# Patient Record
Sex: Female | Born: 1935 | Race: White | Hispanic: No | Smoking: Never smoker
Health system: Southern US, Community
[De-identification: ages and names within clinical notes are randomized; demographics above are authoritative.]

## PROBLEM LIST (undated history)

## (undated) DIAGNOSIS — R251 Tremor, unspecified: Secondary | ICD-10-CM

## (undated) DIAGNOSIS — K5790 Diverticulosis of intestine, part unspecified, without perforation or abscess without bleeding: Secondary | ICD-10-CM

## (undated) DIAGNOSIS — F329 Major depressive disorder, single episode, unspecified: Secondary | ICD-10-CM

## (undated) DIAGNOSIS — R51 Headache: Secondary | ICD-10-CM

## (undated) DIAGNOSIS — K5900 Constipation, unspecified: Secondary | ICD-10-CM

## (undated) DIAGNOSIS — G8929 Other chronic pain: Secondary | ICD-10-CM

## (undated) DIAGNOSIS — R519 Headache, unspecified: Secondary | ICD-10-CM

## (undated) DIAGNOSIS — I10 Essential (primary) hypertension: Secondary | ICD-10-CM

## (undated) DIAGNOSIS — Z862 Personal history of diseases of the blood and blood-forming organs and certain disorders involving the immune mechanism: Secondary | ICD-10-CM

## (undated) DIAGNOSIS — F419 Anxiety disorder, unspecified: Secondary | ICD-10-CM

## (undated) DIAGNOSIS — R011 Cardiac murmur, unspecified: Secondary | ICD-10-CM

## (undated) DIAGNOSIS — F32A Depression, unspecified: Secondary | ICD-10-CM

## (undated) DIAGNOSIS — N189 Chronic kidney disease, unspecified: Secondary | ICD-10-CM

## (undated) DIAGNOSIS — E785 Hyperlipidemia, unspecified: Secondary | ICD-10-CM

## (undated) DIAGNOSIS — D649 Anemia, unspecified: Secondary | ICD-10-CM

## (undated) DIAGNOSIS — M25562 Pain in left knee: Principal | ICD-10-CM

## (undated) DIAGNOSIS — E538 Deficiency of other specified B group vitamins: Secondary | ICD-10-CM

## (undated) DIAGNOSIS — M1990 Unspecified osteoarthritis, unspecified site: Secondary | ICD-10-CM

## (undated) DIAGNOSIS — I519 Heart disease, unspecified: Secondary | ICD-10-CM

## (undated) DIAGNOSIS — R64 Cachexia: Secondary | ICD-10-CM

## (undated) HISTORY — DX: Headache: R51

## (undated) HISTORY — DX: Personal history of diseases of the blood and blood-forming organs and certain disorders involving the immune mechanism: Z86.2

## (undated) HISTORY — DX: Heart disease, unspecified: I51.9

## (undated) HISTORY — DX: Headache, unspecified: R51.9

## (undated) HISTORY — DX: Depression, unspecified: F32.A

## (undated) HISTORY — DX: Other chronic pain: G89.29

## (undated) HISTORY — DX: Major depressive disorder, single episode, unspecified: F32.9

## (undated) HISTORY — DX: Anxiety disorder, unspecified: F41.9

## (undated) HISTORY — PX: EYE SURGERY: SHX253

## (undated) HISTORY — DX: Constipation, unspecified: K59.00

## (undated) HISTORY — DX: Cachexia: R64

## (undated) HISTORY — DX: Unspecified osteoarthritis, unspecified site: M19.90

## (undated) HISTORY — DX: Essential (primary) hypertension: I10

## (undated) HISTORY — DX: Diverticulosis of intestine, part unspecified, without perforation or abscess without bleeding: K57.90

## (undated) HISTORY — DX: Chronic kidney disease, unspecified: N18.9

## (undated) HISTORY — DX: Hyperlipidemia, unspecified: E78.5

## (undated) HISTORY — DX: Anemia, unspecified: D64.9

## (undated) HISTORY — PX: CHOLECYSTECTOMY: SHX55

## (undated) HISTORY — DX: Deficiency of other specified B group vitamins: E53.8

## (undated) HISTORY — DX: Pain in left knee: M25.562

## (undated) SURGERY — Surgical Case
Anesthesia: *Unknown

---

## 1971-02-26 HISTORY — PX: ABDOMINAL HYSTERECTOMY: SHX81

## 1998-02-25 HISTORY — PX: HERNIA REPAIR: SHX51

## 1999-11-21 ENCOUNTER — Encounter: Admission: RE | Admit: 1999-11-21 | Discharge: 2000-02-19 | Payer: Self-pay | Admitting: Orthopedic Surgery

## 2000-02-26 HISTORY — PX: CATARACT EXTRACTION W/ INTRAOCULAR LENS  IMPLANT, BILATERAL: SHX1307

## 2001-11-18 ENCOUNTER — Inpatient Hospital Stay (HOSPITAL_COMMUNITY): Admission: EM | Admit: 2001-11-18 | Discharge: 2001-11-26 | Payer: Self-pay | Admitting: Emergency Medicine

## 2001-12-17 ENCOUNTER — Ambulatory Visit (HOSPITAL_COMMUNITY): Admission: RE | Admit: 2001-12-17 | Discharge: 2001-12-17 | Payer: Self-pay | Admitting: Family Medicine

## 2001-12-17 ENCOUNTER — Encounter: Payer: Self-pay | Admitting: Family Medicine

## 2002-01-26 ENCOUNTER — Ambulatory Visit (HOSPITAL_COMMUNITY): Admission: RE | Admit: 2002-01-26 | Discharge: 2002-01-26 | Payer: Self-pay | Admitting: Ophthalmology

## 2002-03-12 ENCOUNTER — Ambulatory Visit (HOSPITAL_COMMUNITY): Admission: RE | Admit: 2002-03-12 | Discharge: 2002-03-12 | Payer: Self-pay | Admitting: Family Medicine

## 2002-03-12 ENCOUNTER — Encounter: Payer: Self-pay | Admitting: Family Medicine

## 2002-09-15 ENCOUNTER — Encounter: Payer: Self-pay | Admitting: Family Medicine

## 2002-09-15 ENCOUNTER — Encounter: Admission: RE | Admit: 2002-09-15 | Discharge: 2002-09-15 | Payer: Self-pay | Admitting: Family Medicine

## 2002-12-02 ENCOUNTER — Ambulatory Visit (HOSPITAL_COMMUNITY): Admission: RE | Admit: 2002-12-02 | Discharge: 2002-12-02 | Payer: Self-pay | Admitting: Orthopedic Surgery

## 2002-12-02 ENCOUNTER — Ambulatory Visit (HOSPITAL_BASED_OUTPATIENT_CLINIC_OR_DEPARTMENT_OTHER): Admission: RE | Admit: 2002-12-02 | Discharge: 2002-12-03 | Payer: Self-pay | Admitting: Orthopedic Surgery

## 2002-12-06 ENCOUNTER — Encounter: Payer: Self-pay | Admitting: Emergency Medicine

## 2002-12-06 ENCOUNTER — Inpatient Hospital Stay (HOSPITAL_COMMUNITY): Admission: EM | Admit: 2002-12-06 | Discharge: 2002-12-07 | Payer: Self-pay | Admitting: Emergency Medicine

## 2002-12-16 ENCOUNTER — Encounter: Admission: RE | Admit: 2002-12-16 | Discharge: 2003-03-16 | Payer: Self-pay | Admitting: Orthopedic Surgery

## 2003-09-12 ENCOUNTER — Inpatient Hospital Stay (HOSPITAL_COMMUNITY): Admission: EM | Admit: 2003-09-12 | Discharge: 2003-09-16 | Payer: Self-pay | Admitting: Emergency Medicine

## 2005-02-25 HISTORY — PX: JOINT REPLACEMENT: SHX530

## 2005-03-12 ENCOUNTER — Encounter: Admission: RE | Admit: 2005-03-12 | Discharge: 2005-03-28 | Payer: Self-pay | Admitting: Neurology

## 2005-05-08 ENCOUNTER — Encounter: Admission: RE | Admit: 2005-05-08 | Discharge: 2005-05-08 | Payer: Self-pay | Admitting: Neurology

## 2005-09-17 ENCOUNTER — Inpatient Hospital Stay (HOSPITAL_COMMUNITY): Admission: RE | Admit: 2005-09-17 | Discharge: 2005-09-23 | Payer: Self-pay | Admitting: Orthopedic Surgery

## 2005-09-19 ENCOUNTER — Ambulatory Visit: Payer: Self-pay | Admitting: Hematology & Oncology

## 2005-09-20 ENCOUNTER — Encounter: Payer: Self-pay | Admitting: Vascular Surgery

## 2005-09-23 ENCOUNTER — Ambulatory Visit: Payer: Self-pay | Admitting: Physical Medicine & Rehabilitation

## 2005-10-29 ENCOUNTER — Encounter: Admission: RE | Admit: 2005-10-29 | Discharge: 2005-11-26 | Payer: Self-pay | Admitting: Orthopedic Surgery

## 2006-03-14 ENCOUNTER — Ambulatory Visit: Payer: Self-pay | Admitting: Internal Medicine

## 2006-03-25 ENCOUNTER — Ambulatory Visit: Payer: Self-pay | Admitting: Internal Medicine

## 2006-05-02 ENCOUNTER — Ambulatory Visit: Payer: Self-pay | Admitting: Hematology & Oncology

## 2006-05-23 LAB — CBC & DIFF AND RETIC
Basophils Absolute: 0 10*3/uL (ref 0.0–0.1)
EOS%: 2.3 % (ref 0.0–7.0)
HGB: 9.4 g/dL — ABNORMAL LOW (ref 11.6–15.9)
LYMPH%: 36.7 % (ref 14.0–48.0)
MCH: 32.8 pg (ref 26.0–34.0)
MCV: 94.4 fL (ref 81.0–101.0)
MONO%: 10.3 % (ref 0.0–13.0)
NEUT%: 50 % (ref 39.6–76.8)
Platelets: 163 10*3/uL (ref 145–400)
RDW: 12.3 % (ref 11.3–14.5)
RETIC #: 38.5 10*3/uL (ref 19.7–115.1)

## 2006-05-27 LAB — COMPREHENSIVE METABOLIC PANEL
AST: 16 U/L (ref 0–37)
Albumin: 4.1 g/dL (ref 3.5–5.2)
Alkaline Phosphatase: 60 U/L (ref 39–117)
BUN: 20 mg/dL (ref 6–23)
Creatinine, Ser: 1.3 mg/dL — ABNORMAL HIGH (ref 0.40–1.20)
Glucose, Bld: 93 mg/dL (ref 70–99)
Potassium: 5.4 mEq/L — ABNORMAL HIGH (ref 3.5–5.3)
Total Bilirubin: 0.5 mg/dL (ref 0.3–1.2)

## 2006-05-27 LAB — PROTEIN ELECTROPHORESIS, SERUM
Albumin ELP: 62 % (ref 55.8–66.1)
Alpha-1-Globulin: 3.8 % (ref 2.9–4.9)
Alpha-2-Globulin: 11.2 % (ref 7.1–11.8)
Beta 2: 4.7 % (ref 3.2–6.5)
Beta Globulin: 6.6 % (ref 4.7–7.2)
Total Protein, Serum Electrophoresis: 6.3 g/dL (ref 6.0–8.3)

## 2006-05-27 LAB — ERYTHROPOIETIN: Erythropoietin: 12.8 m[IU]/mL (ref 2.6–34.0)

## 2006-06-13 LAB — CBC WITH DIFFERENTIAL/PLATELET
BASO%: 1 % (ref 0.0–2.0)
Eosinophils Absolute: 0.1 10*3/uL (ref 0.0–0.5)
HCT: 32.8 % — ABNORMAL LOW (ref 34.8–46.6)
HGB: 11.5 g/dL — ABNORMAL LOW (ref 11.6–15.9)
LYMPH%: 36 % (ref 14.0–48.0)
MONO#: 0.5 10*3/uL (ref 0.1–0.9)
NEUT#: 1.9 10*3/uL (ref 1.5–6.5)
NEUT%: 47.5 % (ref 39.6–76.8)
Platelets: 108 10*3/uL — ABNORMAL LOW (ref 145–400)
WBC: 3.9 10*3/uL (ref 3.9–10.0)
lymph#: 1.4 10*3/uL (ref 0.9–3.3)

## 2006-07-02 ENCOUNTER — Ambulatory Visit: Payer: Self-pay | Admitting: Hematology & Oncology

## 2006-07-04 LAB — CBC WITH DIFFERENTIAL/PLATELET
BASO%: 0.6 % (ref 0.0–2.0)
Basophils Absolute: 0 10*3/uL (ref 0.0–0.1)
HCT: 37.5 % (ref 34.8–46.6)
HGB: 12.7 g/dL (ref 11.6–15.9)
LYMPH%: 32.4 % (ref 14.0–48.0)
MCH: 32.3 pg (ref 26.0–34.0)
MCHC: 33.9 g/dL (ref 32.0–36.0)
MONO#: 0.5 10*3/uL (ref 0.1–0.9)
NEUT%: 53.9 % (ref 39.6–76.8)
Platelets: 97 10*3/uL — ABNORMAL LOW (ref 145–400)
lymph#: 1.5 10*3/uL (ref 0.9–3.3)

## 2006-08-01 LAB — CBC WITH DIFFERENTIAL/PLATELET
Basophils Absolute: 0 10*3/uL (ref 0.0–0.1)
Eosinophils Absolute: 0.1 10*3/uL (ref 0.0–0.5)
HCT: 33.4 % — ABNORMAL LOW (ref 34.8–46.6)
LYMPH%: 38.8 % (ref 14.0–48.0)
MCV: 92.3 fL (ref 81.0–101.0)
MONO#: 0.5 10*3/uL (ref 0.1–0.9)
MONO%: 9.9 % (ref 0.0–13.0)
NEUT#: 2.4 10*3/uL (ref 1.5–6.5)
NEUT%: 48.1 % (ref 39.6–76.8)
Platelets: 100 10*3/uL — ABNORMAL LOW (ref 145–400)
WBC: 5.1 10*3/uL (ref 3.9–10.0)

## 2006-09-10 ENCOUNTER — Ambulatory Visit: Payer: Self-pay | Admitting: Hematology & Oncology

## 2006-09-12 LAB — CBC WITH DIFFERENTIAL/PLATELET
BASO%: 0.6 % (ref 0.0–2.0)
LYMPH%: 41 % (ref 14.0–48.0)
MCHC: 34 g/dL (ref 32.0–36.0)
MONO#: 0.5 10*3/uL (ref 0.1–0.9)
RBC: 3.79 10*6/uL (ref 3.70–5.32)
WBC: 4.4 10*3/uL (ref 3.9–10.0)
lymph#: 1.8 10*3/uL (ref 0.9–3.3)

## 2006-09-12 LAB — CHCC SMEAR

## 2006-10-22 ENCOUNTER — Ambulatory Visit: Payer: Self-pay | Admitting: Hematology & Oncology

## 2006-10-24 LAB — CBC WITH DIFFERENTIAL/PLATELET
Eosinophils Absolute: 0.1 10*3/uL (ref 0.0–0.5)
MCHC: 35.2 g/dL (ref 32.0–36.0)
MCV: 91.9 fL (ref 81.0–101.0)
MONO%: 11.3 % (ref 0.0–13.0)
NEUT#: 1.7 10*3/uL (ref 1.5–6.5)
RBC: 2.94 10*6/uL — ABNORMAL LOW (ref 3.70–5.32)
RDW: 15 % — ABNORMAL HIGH (ref 11.3–14.5)
WBC: 4.2 10*3/uL (ref 3.9–10.0)

## 2006-12-09 ENCOUNTER — Ambulatory Visit: Payer: Self-pay | Admitting: Hematology & Oncology

## 2006-12-12 LAB — CBC WITH DIFFERENTIAL/PLATELET
BASO%: 0.5 % (ref 0.0–2.0)
Basophils Absolute: 0 10*3/uL (ref 0.0–0.1)
EOS%: 3 % (ref 0.0–7.0)
HCT: 29 % — ABNORMAL LOW (ref 34.8–46.6)
HGB: 10.2 g/dL — ABNORMAL LOW (ref 11.6–15.9)
MCH: 33.1 pg (ref 26.0–34.0)
MCHC: 35.1 g/dL (ref 32.0–36.0)
MCV: 94.3 fL (ref 81.0–101.0)
MONO%: 10.3 % (ref 0.0–13.0)
NEUT%: 45.4 % (ref 39.6–76.8)

## 2007-01-27 ENCOUNTER — Ambulatory Visit: Payer: Self-pay | Admitting: Hematology & Oncology

## 2007-01-29 LAB — CBC WITH DIFFERENTIAL/PLATELET
BASO%: 0.6 % (ref 0.0–2.0)
EOS%: 2.5 % (ref 0.0–7.0)
LYMPH%: 33 % (ref 14.0–48.0)
MCH: 32.5 pg (ref 26.0–34.0)
MCHC: 34.5 g/dL (ref 32.0–36.0)
MCV: 94.2 fL (ref 81.0–101.0)
MONO%: 10.2 % (ref 0.0–13.0)
Platelets: 106 10*3/uL — ABNORMAL LOW (ref 145–400)
RBC: 3.25 10*6/uL — ABNORMAL LOW (ref 3.70–5.32)

## 2007-01-29 LAB — CHCC SMEAR

## 2007-02-20 LAB — CBC WITH DIFFERENTIAL/PLATELET
Basophils Absolute: 0 10*3/uL (ref 0.0–0.1)
EOS%: 1.9 % (ref 0.0–7.0)
HGB: 12.3 g/dL (ref 11.6–15.9)
MCH: 32.3 pg (ref 26.0–34.0)
MCHC: 33.7 g/dL (ref 32.0–36.0)
MCV: 95.9 fL (ref 81.0–101.0)
MONO%: 14.2 % — ABNORMAL HIGH (ref 0.0–13.0)
RBC: 3.8 10*6/uL (ref 3.70–5.32)
RDW: 15 % — ABNORMAL HIGH (ref 11.3–14.5)

## 2007-03-11 ENCOUNTER — Ambulatory Visit: Payer: Self-pay | Admitting: Hematology & Oncology

## 2007-03-13 LAB — CBC & DIFF AND RETIC
BASO%: 0.4 % (ref 0.0–2.0)
LYMPH%: 27.5 % (ref 14.0–48.0)
MCHC: 33.6 g/dL (ref 32.0–36.0)
MONO#: 0.7 10*3/uL (ref 0.1–0.9)
MONO%: 10.6 % (ref 0.0–13.0)
Platelets: 104 10*3/uL — ABNORMAL LOW (ref 145–400)
RBC: 3.57 10*6/uL — ABNORMAL LOW (ref 3.70–5.32)
Retic %: 0.7 % (ref 0.4–2.3)
WBC: 6.2 10*3/uL (ref 3.9–10.0)

## 2007-03-16 LAB — FERRITIN: Ferritin: 1230 ng/mL — ABNORMAL HIGH (ref 10–291)

## 2007-04-03 LAB — CBC WITH DIFFERENTIAL/PLATELET
Basophils Absolute: 0 10*3/uL (ref 0.0–0.1)
Eosinophils Absolute: 0.1 10*3/uL (ref 0.0–0.5)
HCT: 35.7 % (ref 34.8–46.6)
HGB: 11.5 g/dL — ABNORMAL LOW (ref 11.6–15.9)
LYMPH%: 27.1 % (ref 14.0–48.0)
MCV: 94.8 fL (ref 81.0–101.0)
MONO%: 10.3 % (ref 0.0–13.0)
NEUT#: 3.2 10*3/uL (ref 1.5–6.5)
Platelets: 140 10*3/uL — ABNORMAL LOW (ref 145–400)

## 2007-04-22 ENCOUNTER — Ambulatory Visit: Payer: Self-pay | Admitting: Hematology & Oncology

## 2007-04-24 LAB — CBC WITH DIFFERENTIAL/PLATELET
Eosinophils Absolute: 0.1 10*3/uL (ref 0.0–0.5)
LYMPH%: 35.7 % (ref 14.0–48.0)
MCHC: 33.6 g/dL (ref 32.0–36.0)
MCV: 95.3 fL (ref 81.0–101.0)
MONO%: 10.5 % (ref 0.0–13.0)
NEUT#: 2.2 10*3/uL (ref 1.5–6.5)
NEUT%: 50.7 % (ref 39.6–76.8)
Platelets: 117 10*3/uL — ABNORMAL LOW (ref 145–400)
RBC: 3.84 10*6/uL (ref 3.70–5.32)

## 2007-04-24 LAB — CHCC SMEAR

## 2007-05-15 LAB — CBC WITH DIFFERENTIAL/PLATELET
BASO%: 0.6 % (ref 0.0–2.0)
Basophils Absolute: 0 10*3/uL (ref 0.0–0.1)
EOS%: 2.1 % (ref 0.0–7.0)
HCT: 33.7 % — ABNORMAL LOW (ref 34.8–46.6)
HGB: 11.6 g/dL (ref 11.6–15.9)
LYMPH%: 33.7 % (ref 14.0–48.0)
MCH: 32.3 pg (ref 26.0–34.0)
MCHC: 34.5 g/dL (ref 32.0–36.0)
MCV: 93.5 fL (ref 81.0–101.0)
NEUT%: 53 % (ref 39.6–76.8)
Platelets: 101 10*3/uL — ABNORMAL LOW (ref 145–400)
lymph#: 1.8 10*3/uL (ref 0.9–3.3)

## 2007-06-03 ENCOUNTER — Ambulatory Visit: Payer: Self-pay | Admitting: Hematology & Oncology

## 2007-06-05 LAB — CBC WITH DIFFERENTIAL/PLATELET
BASO%: 0.1 % (ref 0.0–2.0)
Basophils Absolute: 0 10*3/uL (ref 0.0–0.1)
EOS%: 1.5 % (ref 0.0–7.0)
Eosinophils Absolute: 0.1 10*3/uL (ref 0.0–0.5)
HCT: 29.1 % — ABNORMAL LOW (ref 34.8–46.6)
HGB: 10.1 g/dL — ABNORMAL LOW (ref 11.6–15.9)
LYMPH%: 30.2 % (ref 14.0–48.0)
MCH: 31.9 pg (ref 26.0–34.0)
MCHC: 34.6 g/dL (ref 32.0–36.0)
MCV: 92.2 fL (ref 81.0–101.0)
MONO#: 0.6 10*3/uL (ref 0.1–0.9)
MONO%: 9.7 % (ref 0.0–13.0)
NEUT#: 3.4 10*3/uL (ref 1.5–6.5)
NEUT%: 58.5 % (ref 39.6–76.8)
Platelets: 96 10*3/uL — ABNORMAL LOW (ref 145–400)
RBC: 3.16 10*6/uL — ABNORMAL LOW (ref 3.70–5.32)
RDW: 13.7 % (ref 11.3–14.5)
WBC: 5.9 10*3/uL (ref 3.9–10.0)
lymph#: 1.8 10*3/uL (ref 0.9–3.3)

## 2007-06-08 ENCOUNTER — Encounter: Admission: RE | Admit: 2007-06-08 | Discharge: 2007-06-08 | Payer: Self-pay | Admitting: Family Medicine

## 2007-06-18 ENCOUNTER — Encounter: Admission: RE | Admit: 2007-06-18 | Discharge: 2007-06-18 | Payer: Self-pay | Admitting: Family Medicine

## 2007-06-26 LAB — CBC WITH DIFFERENTIAL/PLATELET
Basophils Absolute: 0.1 10*3/uL (ref 0.0–0.1)
Eosinophils Absolute: 0.1 10*3/uL (ref 0.0–0.5)
HGB: 11.2 g/dL — ABNORMAL LOW (ref 11.6–15.9)
MCV: 93.6 fL (ref 81.0–101.0)
MONO#: 0.5 10*3/uL (ref 0.1–0.9)
MONO%: 11.3 % (ref 0.0–13.0)
NEUT#: 2.3 10*3/uL (ref 1.5–6.5)
Platelets: 114 10*3/uL — ABNORMAL LOW (ref 145–400)
RBC: 3.5 10*6/uL — ABNORMAL LOW (ref 3.70–5.32)
RDW: 14.8 % — ABNORMAL HIGH (ref 11.3–14.5)
WBC: 4.7 10*3/uL (ref 3.9–10.0)

## 2007-06-26 LAB — CHCC SMEAR

## 2007-07-15 ENCOUNTER — Ambulatory Visit: Payer: Self-pay | Admitting: Hematology & Oncology

## 2007-08-07 LAB — CBC & DIFF AND RETIC
BASO%: 0.6 % (ref 0.0–2.0)
EOS%: 2.3 % (ref 0.0–7.0)
HCT: 38.2 % (ref 34.8–46.6)
LYMPH%: 33.5 % (ref 14.0–48.0)
MCH: 31.6 pg (ref 26.0–34.0)
MCHC: 33.7 g/dL (ref 32.0–36.0)
MONO#: 0.5 10*3/uL (ref 0.1–0.9)
MONO%: 11.2 % (ref 0.0–13.0)
NEUT%: 52.4 % (ref 39.6–76.8)
Platelets: 117 10*3/uL — ABNORMAL LOW (ref 145–400)
RBC: 4.08 10*6/uL (ref 3.70–5.32)
Retic %: 0.7 % (ref 0.4–2.3)
WBC: 4.9 10*3/uL (ref 3.9–10.0)

## 2007-08-27 LAB — CBC WITH DIFFERENTIAL/PLATELET
Basophils Absolute: 0 10*3/uL (ref 0.0–0.1)
Eosinophils Absolute: 0.1 10*3/uL (ref 0.0–0.5)
HCT: 33.8 % — ABNORMAL LOW (ref 34.8–46.6)
HGB: 11.4 g/dL — ABNORMAL LOW (ref 11.6–15.9)
LYMPH%: 48.6 % — ABNORMAL HIGH (ref 14.0–48.0)
MCV: 92 fL (ref 81.0–101.0)
MONO#: 0.4 10*3/uL (ref 0.1–0.9)
MONO%: 9.4 % (ref 0.0–13.0)
NEUT#: 1.8 10*3/uL (ref 1.5–6.5)
Platelets: 92 10*3/uL — ABNORMAL LOW (ref 145–400)
WBC: 4.7 10*3/uL (ref 3.9–10.0)

## 2007-09-15 ENCOUNTER — Ambulatory Visit: Payer: Self-pay | Admitting: Hematology

## 2007-09-18 LAB — CBC WITH DIFFERENTIAL/PLATELET
Eosinophils Absolute: 0.1 10*3/uL (ref 0.0–0.5)
HCT: 35.1 % (ref 34.8–46.6)
LYMPH%: 32.5 % (ref 14.0–48.0)
MCHC: 33.7 g/dL (ref 32.0–36.0)
MCV: 92.7 fL (ref 81.0–101.0)
MONO#: 0.4 10*3/uL (ref 0.1–0.9)
MONO%: 10.3 % (ref 0.0–13.0)
NEUT#: 2.3 10*3/uL (ref 1.5–6.5)
NEUT%: 54.3 % (ref 39.6–76.8)
Platelets: 101 10*3/uL — ABNORMAL LOW (ref 145–400)
RBC: 3.79 10*6/uL (ref 3.70–5.32)

## 2007-10-09 LAB — CBC & DIFF AND RETIC
BASO%: 0.5 % (ref 0.0–2.0)
Basophils Absolute: 0 10*3/uL (ref 0.0–0.1)
EOS%: 2.5 % (ref 0.0–7.0)
Eosinophils Absolute: 0.1 10*3/uL (ref 0.0–0.5)
HCT: 32.7 % — ABNORMAL LOW (ref 34.8–46.6)
HGB: 11 g/dL — ABNORMAL LOW (ref 11.6–15.9)
IRF: 0.26 (ref 0.130–0.330)
LYMPH%: 40.5 % (ref 14.0–48.0)
MCH: 31.4 pg (ref 26.0–34.0)
MCHC: 33.8 g/dL (ref 32.0–36.0)
MCV: 93 fL (ref 81.0–101.0)
MONO#: 0.4 10*3/uL (ref 0.1–0.9)
MONO%: 10 % (ref 0.0–13.0)
NEUT#: 2 10*3/uL (ref 1.5–6.5)
NEUT%: 46.5 % (ref 39.6–76.8)
Platelets: 120 10*3/uL — ABNORMAL LOW (ref 145–400)
RBC: 3.51 10*6/uL — ABNORMAL LOW (ref 3.70–5.32)
RDW: 14.3 % (ref 11.3–14.5)
RETIC #: 18.3 10*3/uL — ABNORMAL LOW (ref 19.7–115.1)
Retic %: 0.5 % (ref 0.4–2.3)
WBC: 4.4 10*3/uL (ref 3.9–10.0)
lymph#: 1.8 10*3/uL (ref 0.9–3.3)

## 2007-10-13 LAB — FERRITIN: Ferritin: 858 ng/mL — ABNORMAL HIGH (ref 10–291)

## 2007-10-13 LAB — SPEP & IFE WITH QIG
Beta 2: 4.9 % (ref 3.2–6.5)
Beta Globulin: 6.6 % (ref 4.7–7.2)
Gamma Globulin: 10.7 % — ABNORMAL LOW (ref 11.1–18.8)
IgA: 216 mg/dL (ref 68–378)
IgG (Immunoglobin G), Serum: 660 mg/dL — ABNORMAL LOW (ref 694–1618)
Total Protein, Serum Electrophoresis: 5.9 g/dL — ABNORMAL LOW (ref 6.0–8.3)

## 2007-10-13 LAB — METHYLMALONIC ACID, SERUM: Methylmalonic Acid, Quantitative: 1210 nmol/L — ABNORMAL HIGH (ref 87–318)

## 2007-10-13 LAB — COMPREHENSIVE METABOLIC PANEL
ALT: 11 U/L (ref 0–35)
BUN: 31 mg/dL — ABNORMAL HIGH (ref 6–23)
CO2: 25 mEq/L (ref 19–32)
Creatinine, Ser: 1.61 mg/dL — ABNORMAL HIGH (ref 0.40–1.20)
Total Bilirubin: 0.4 mg/dL (ref 0.3–1.2)

## 2007-10-13 LAB — VITAMIN B12: Vitamin B-12: 303 pg/mL (ref 211–911)

## 2007-10-13 LAB — FOLATE: Folate: 19 ng/mL

## 2007-11-03 ENCOUNTER — Ambulatory Visit: Payer: Self-pay | Admitting: Hematology

## 2007-11-06 LAB — CBC WITH DIFFERENTIAL/PLATELET
BASO%: 0.6 % (ref 0.0–2.0)
EOS%: 2.2 % (ref 0.0–7.0)
HCT: 33.1 % — ABNORMAL LOW (ref 34.8–46.6)
LYMPH%: 43.8 % (ref 14.0–48.0)
MCH: 31.1 pg (ref 26.0–34.0)
MCHC: 33.6 g/dL (ref 32.0–36.0)
MONO#: 0.4 10*3/uL (ref 0.1–0.9)
NEUT%: 43.9 % (ref 39.6–76.8)
Platelets: 153 10*3/uL (ref 145–400)

## 2007-12-04 LAB — CBC WITH DIFFERENTIAL/PLATELET
Basophils Absolute: 0 10*3/uL (ref 0.0–0.1)
Eosinophils Absolute: 0.1 10*3/uL (ref 0.0–0.5)
HCT: 32.8 % — ABNORMAL LOW (ref 34.8–46.6)
LYMPH%: 39.4 % (ref 14.0–48.0)
MONO#: 0.4 10*3/uL (ref 0.1–0.9)
NEUT#: 1.8 10*3/uL (ref 1.5–6.5)
NEUT%: 46 % (ref 39.6–76.8)
Platelets: 109 10*3/uL — ABNORMAL LOW (ref 145–400)
WBC: 3.9 10*3/uL (ref 3.9–10.0)

## 2007-12-30 ENCOUNTER — Ambulatory Visit: Payer: Self-pay | Admitting: Hematology

## 2008-01-01 LAB — CBC WITH DIFFERENTIAL/PLATELET
Eosinophils Absolute: 0.1 10*3/uL (ref 0.0–0.5)
LYMPH%: 36.3 % (ref 14.0–48.0)
MONO#: 0.5 10*3/uL (ref 0.1–0.9)
NEUT#: 2.7 10*3/uL (ref 1.5–6.5)
Platelets: 99 10*3/uL — ABNORMAL LOW (ref 145–400)
RBC: 3.2 10*6/uL — ABNORMAL LOW (ref 3.70–5.32)
RDW: 14.4 % (ref 11.3–14.5)
WBC: 5.3 10*3/uL (ref 3.9–10.0)

## 2008-01-29 LAB — CBC WITH DIFFERENTIAL/PLATELET
BASO%: 1 % (ref 0.0–2.0)
EOS%: 1.8 % (ref 0.0–7.0)
LYMPH%: 42 % (ref 14.0–48.0)
MCH: 31.9 pg (ref 26.0–34.0)
MCHC: 34 g/dL (ref 32.0–36.0)
MCV: 94 fL (ref 81.0–101.0)
MONO%: 11.1 % (ref 0.0–13.0)
Platelets: 99 10*3/uL — ABNORMAL LOW (ref 145–400)
RBC: 3.3 10*6/uL — ABNORMAL LOW (ref 3.70–5.32)
WBC: 4.3 10*3/uL (ref 3.9–10.0)

## 2008-02-12 LAB — CBC & DIFF AND RETIC
BASO%: 0.7 % (ref 0.0–2.0)
EOS%: 1.7 % (ref 0.0–7.0)
HCT: 35.4 % (ref 34.8–46.6)
IRF: 0.38 — ABNORMAL HIGH (ref 0.130–0.330)
MCH: 32.1 pg (ref 26.0–34.0)
MCHC: 33.7 g/dL (ref 32.0–36.0)
NEUT%: 63.5 % (ref 39.6–76.8)
RBC: 3.72 10*6/uL (ref 3.70–5.32)
RDW: 15.8 % — ABNORMAL HIGH (ref 11.3–14.5)
Retic %: 2 % (ref 0.4–2.3)
lymph#: 1.3 10*3/uL (ref 0.9–3.3)

## 2008-02-12 LAB — MORPHOLOGY: PLT EST: DECREASED

## 2008-02-12 LAB — CHCC SMEAR

## 2008-02-12 LAB — COMPREHENSIVE METABOLIC PANEL
Albumin: 4.3 g/dL (ref 3.5–5.2)
Alkaline Phosphatase: 31 U/L — ABNORMAL LOW (ref 39–117)
BUN: 22 mg/dL (ref 6–23)
Glucose, Bld: 87 mg/dL (ref 70–99)
Potassium: 4.7 mEq/L (ref 3.5–5.3)

## 2008-02-15 ENCOUNTER — Ambulatory Visit: Payer: Self-pay | Admitting: Hematology

## 2008-03-11 LAB — CBC WITH DIFFERENTIAL/PLATELET
Basophils Absolute: 0 10*3/uL (ref 0.0–0.1)
Eosinophils Absolute: 0.1 10*3/uL (ref 0.0–0.5)
LYMPH%: 33 % (ref 14.0–48.0)
MCV: 93.2 fL (ref 81.0–101.0)
MONO%: 9 % (ref 0.0–13.0)
NEUT#: 2.4 10*3/uL (ref 1.5–6.5)
NEUT%: 55.8 % (ref 39.6–76.8)
Platelets: 94 10*3/uL — ABNORMAL LOW (ref 145–400)
RBC: 3.43 10*6/uL — ABNORMAL LOW (ref 3.70–5.32)

## 2008-04-13 ENCOUNTER — Ambulatory Visit: Payer: Self-pay | Admitting: Oncology

## 2008-04-15 LAB — CBC WITH DIFFERENTIAL/PLATELET
Eosinophils Absolute: 0.1 10*3/uL (ref 0.0–0.5)
MONO#: 0.5 10*3/uL (ref 0.1–0.9)
MONO%: 11.1 % (ref 0.0–14.0)
NEUT#: 2.4 10*3/uL (ref 1.5–6.5)
RBC: 3.48 10*6/uL — ABNORMAL LOW (ref 3.70–5.45)
RDW: 14.8 % — ABNORMAL HIGH (ref 11.2–14.5)
WBC: 4.7 10*3/uL (ref 3.9–10.3)
lymph#: 1.7 10*3/uL (ref 0.9–3.3)

## 2008-05-13 LAB — CBC WITH DIFFERENTIAL/PLATELET
Basophils Absolute: 0 10*3/uL (ref 0.0–0.1)
EOS%: 1.6 % (ref 0.0–7.0)
HCT: 28.9 % — ABNORMAL LOW (ref 34.8–46.6)
HGB: 9.8 g/dL — ABNORMAL LOW (ref 11.6–15.9)
MCH: 31.4 pg (ref 25.1–34.0)
MCV: 92.8 fL (ref 79.5–101.0)
MONO%: 9 % (ref 0.0–14.0)
NEUT%: 66.7 % (ref 38.4–76.8)

## 2008-05-13 LAB — BASIC METABOLIC PANEL
BUN: 24 mg/dL — ABNORMAL HIGH (ref 6–23)
Calcium: 9.3 mg/dL (ref 8.4–10.5)
Creatinine, Ser: 1.3 mg/dL — ABNORMAL HIGH (ref 0.40–1.20)

## 2008-05-20 ENCOUNTER — Encounter (INDEPENDENT_AMBULATORY_CARE_PROVIDER_SITE_OTHER): Payer: Self-pay | Admitting: *Deleted

## 2008-05-20 ENCOUNTER — Emergency Department (HOSPITAL_COMMUNITY): Admission: EM | Admit: 2008-05-20 | Discharge: 2008-05-20 | Payer: Self-pay | Admitting: Emergency Medicine

## 2008-05-23 ENCOUNTER — Telehealth: Payer: Self-pay | Admitting: Internal Medicine

## 2008-05-25 ENCOUNTER — Encounter: Payer: Self-pay | Admitting: Nurse Practitioner

## 2008-05-25 DIAGNOSIS — R110 Nausea: Secondary | ICD-10-CM

## 2008-05-25 DIAGNOSIS — K5730 Diverticulosis of large intestine without perforation or abscess without bleeding: Secondary | ICD-10-CM | POA: Insufficient documentation

## 2008-06-08 ENCOUNTER — Ambulatory Visit: Payer: Self-pay | Admitting: Oncology

## 2008-06-10 LAB — CBC WITH DIFFERENTIAL/PLATELET
Basophils Absolute: 0 10*3/uL (ref 0.0–0.1)
EOS%: 6.6 % (ref 0.0–7.0)
HCT: 30.1 % — ABNORMAL LOW (ref 34.8–46.6)
HGB: 10.1 g/dL — ABNORMAL LOW (ref 11.6–15.9)
LYMPH%: 35.2 % (ref 14.0–49.7)
MCH: 31.1 pg (ref 25.1–34.0)
MONO#: 0.4 10*3/uL (ref 0.1–0.9)
NEUT%: 47.4 % (ref 38.4–76.8)
Platelets: 139 10*3/uL — ABNORMAL LOW (ref 145–400)
lymph#: 1.5 10*3/uL (ref 0.9–3.3)

## 2008-07-02 ENCOUNTER — Ambulatory Visit: Payer: Self-pay | Admitting: Pulmonary Disease

## 2008-07-02 ENCOUNTER — Inpatient Hospital Stay (HOSPITAL_COMMUNITY): Admission: EM | Admit: 2008-07-02 | Discharge: 2008-07-24 | Payer: Self-pay | Admitting: Emergency Medicine

## 2008-07-04 ENCOUNTER — Ambulatory Visit: Payer: Self-pay | Admitting: Internal Medicine

## 2008-07-05 ENCOUNTER — Encounter: Payer: Self-pay | Admitting: Internal Medicine

## 2008-07-20 ENCOUNTER — Encounter: Payer: Self-pay | Admitting: Internal Medicine

## 2008-08-10 ENCOUNTER — Ambulatory Visit: Payer: Self-pay | Admitting: Oncology

## 2008-08-12 LAB — CBC WITH DIFFERENTIAL/PLATELET
BASO%: 0.6 % (ref 0.0–2.0)
EOS%: 2.6 % (ref 0.0–7.0)
LYMPH%: 35.4 % (ref 14.0–49.7)
MCHC: 34.7 g/dL (ref 31.5–36.0)
MONO#: 0.4 10*3/uL (ref 0.1–0.9)
Platelets: 168 10*3/uL (ref 145–400)
RBC: 3.76 10*6/uL (ref 3.70–5.45)
WBC: 5.2 10*3/uL (ref 3.9–10.3)
lymph#: 1.9 10*3/uL (ref 0.9–3.3)

## 2008-08-19 LAB — IRON AND TIBC
Iron: 134 ug/dL (ref 42–145)
TIBC: 228 ug/dL — ABNORMAL LOW (ref 250–470)
UIBC: 94 ug/dL

## 2008-08-19 LAB — COMPREHENSIVE METABOLIC PANEL WITH GFR
ALT: 13 U/L (ref 0–35)
AST: 18 U/L (ref 0–37)
Albumin: 3.3 g/dL — ABNORMAL LOW (ref 3.5–5.2)
Alkaline Phosphatase: 60 U/L (ref 39–117)
BUN: 40 mg/dL — ABNORMAL HIGH (ref 6–23)
CO2: 27 meq/L (ref 19–32)
Calcium: 9.1 mg/dL (ref 8.4–10.5)
Chloride: 103 meq/L (ref 96–112)
Creatinine, Ser: 2.15 mg/dL — ABNORMAL HIGH (ref 0.40–1.20)
Glucose, Bld: 113 mg/dL — ABNORMAL HIGH (ref 70–99)
Potassium: 5.4 meq/L — ABNORMAL HIGH (ref 3.5–5.3)
Sodium: 137 meq/L (ref 135–145)
Total Bilirubin: 0.5 mg/dL (ref 0.3–1.2)
Total Protein: 6.1 g/dL (ref 6.0–8.3)

## 2008-08-19 LAB — PROTEIN ELECTROPHORESIS, SERUM
Alpha-2-Globulin: 13.7 % — ABNORMAL HIGH (ref 7.1–11.8)
Beta 2: 6.2 % (ref 3.2–6.5)
Beta Globulin: 6.1 % (ref 4.7–7.2)
Total Protein, Serum Electrophoresis: 6.1 g/dL (ref 6.0–8.3)

## 2008-08-19 LAB — FERRITIN: Ferritin: 1380 ng/mL — ABNORMAL HIGH (ref 10–291)

## 2008-08-19 LAB — FOLATE: Folate: >20 ng/mL

## 2008-08-19 LAB — METHYLMALONIC ACID, SERUM: Methylmalonic Acid, Quantitative: 1430

## 2008-09-07 ENCOUNTER — Ambulatory Visit: Payer: Self-pay | Admitting: Oncology

## 2008-09-09 LAB — CBC WITH DIFFERENTIAL/PLATELET
BASO%: 0.9 % (ref 0.0–2.0)
LYMPH%: 54.7 % — ABNORMAL HIGH (ref 14.0–49.7)
MCHC: 34.5 g/dL (ref 31.5–36.0)
MCV: 90.1 fL (ref 79.5–101.0)
MONO%: 10 % (ref 0.0–14.0)
Platelets: 94 10*3/uL — ABNORMAL LOW (ref 145–400)
RBC: 3.15 10*6/uL — ABNORMAL LOW (ref 3.70–5.45)

## 2008-10-05 ENCOUNTER — Ambulatory Visit: Payer: Self-pay | Admitting: Oncology

## 2008-10-07 LAB — CBC WITH DIFFERENTIAL/PLATELET
Basophils Absolute: 0 10*3/uL (ref 0.0–0.1)
EOS%: 1 % (ref 0.0–7.0)
HCT: 32 % — ABNORMAL LOW (ref 34.8–46.6)
HGB: 10.9 g/dL — ABNORMAL LOW (ref 11.6–15.9)
MCH: 31.9 pg (ref 25.1–34.0)
MONO#: 0.3 10*3/uL (ref 0.1–0.9)
NEUT%: 38 % — ABNORMAL LOW (ref 38.4–76.8)
Platelets: 77 10*3/uL — ABNORMAL LOW (ref 145–400)
lymph#: 1.5 10*3/uL (ref 0.9–3.3)

## 2008-10-11 ENCOUNTER — Other Ambulatory Visit: Admission: RE | Admit: 2008-10-11 | Discharge: 2008-10-11 | Payer: Self-pay | Admitting: Oncology

## 2008-10-11 ENCOUNTER — Encounter: Payer: Self-pay | Admitting: Oncology

## 2008-10-11 LAB — CBC WITH DIFFERENTIAL/PLATELET
Basophils Absolute: 0 10*3/uL (ref 0.0–0.1)
EOS%: 0.9 % (ref 0.0–7.0)
HCT: 31.1 % — ABNORMAL LOW (ref 34.8–46.6)
HGB: 10.6 g/dL — ABNORMAL LOW (ref 11.6–15.9)
MCH: 31.9 pg (ref 25.1–34.0)
MCV: 94.1 fL (ref 79.5–101.0)
MONO%: 10.9 % (ref 0.0–14.0)
NEUT%: 48.2 % (ref 38.4–76.8)

## 2008-11-02 ENCOUNTER — Ambulatory Visit: Payer: Self-pay | Admitting: Oncology

## 2008-11-04 LAB — CBC WITH DIFFERENTIAL/PLATELET
BASO%: 1.1 % (ref 0.0–2.0)
LYMPH%: 47.2 % (ref 14.0–49.7)
MCHC: 33.9 g/dL (ref 31.5–36.0)
MCV: 94.9 fL (ref 79.5–101.0)
MONO%: 11.5 % (ref 0.0–14.0)
Platelets: 77 10*3/uL — ABNORMAL LOW (ref 145–400)
RBC: 3.51 10*6/uL — ABNORMAL LOW (ref 3.70–5.45)

## 2008-11-04 LAB — COMPREHENSIVE METABOLIC PANEL
ALT: 9 U/L (ref 0–35)
AST: 14 U/L (ref 0–37)
Creatinine, Ser: 1.29 mg/dL — ABNORMAL HIGH (ref 0.40–1.20)
Total Bilirubin: 0.5 mg/dL (ref 0.3–1.2)

## 2008-11-04 LAB — CHCC SMEAR

## 2008-11-10 LAB — CBC WITH DIFFERENTIAL/PLATELET
BASO%: 0 % (ref 0.0–2.0)
Basophils Absolute: 0 10*3/uL (ref 0.0–0.1)
HCT: 34.7 % — ABNORMAL LOW (ref 34.8–46.6)
LYMPH%: 41 % (ref 14.0–49.7)
MCHC: 32.9 g/dL (ref 31.5–36.0)
MONO#: 1 10*3/uL — ABNORMAL HIGH (ref 0.1–0.9)
NEUT%: 44.8 % (ref 38.4–76.8)
Platelets: 93 10*3/uL — ABNORMAL LOW (ref 145–400)
WBC: 7.1 10*3/uL (ref 3.9–10.3)

## 2008-12-02 ENCOUNTER — Ambulatory Visit: Payer: Self-pay | Admitting: Oncology

## 2008-12-02 LAB — CBC WITH DIFFERENTIAL/PLATELET
Basophils Absolute: 0 10*3/uL (ref 0.0–0.1)
Eosinophils Absolute: 0 10*3/uL (ref 0.0–0.5)
HGB: 10.7 g/dL — ABNORMAL LOW (ref 11.6–15.9)
LYMPH%: 29.7 % (ref 14.0–49.7)
MCV: 94.7 fL (ref 79.5–101.0)
MONO%: 7.4 % (ref 0.0–14.0)
NEUT#: 3.2 10*3/uL (ref 1.5–6.5)
Platelets: 132 10*3/uL — ABNORMAL LOW (ref 145–400)
RBC: 3.32 10*6/uL — ABNORMAL LOW (ref 3.70–5.45)

## 2008-12-29 LAB — COMPREHENSIVE METABOLIC PANEL
ALT: 11 U/L (ref 0–35)
AST: 11 U/L (ref 0–37)
Albumin: 3.6 g/dL (ref 3.5–5.2)
Alkaline Phosphatase: 34 U/L — ABNORMAL LOW (ref 39–117)
BUN: 31 mg/dL — ABNORMAL HIGH (ref 6–23)
CO2: 28 mEq/L (ref 19–32)
Calcium: 8.1 mg/dL — ABNORMAL LOW (ref 8.4–10.5)
Chloride: 99 mEq/L (ref 96–112)
Creatinine, Ser: 1.57 mg/dL — ABNORMAL HIGH (ref 0.40–1.20)
Glucose, Bld: 118 mg/dL — ABNORMAL HIGH (ref 70–99)
Potassium: 4.2 mEq/L (ref 3.5–5.3)
Sodium: 135 mEq/L (ref 135–145)
Total Bilirubin: 0.5 mg/dL (ref 0.3–1.2)
Total Protein: 5.3 g/dL — ABNORMAL LOW (ref 6.0–8.3)

## 2008-12-29 LAB — CBC WITH DIFFERENTIAL/PLATELET
BASO%: 0.2 % (ref 0.0–2.0)
EOS%: 0.4 % (ref 0.0–7.0)
HCT: 35.5 % (ref 34.8–46.6)
MCH: 32.4 pg (ref 25.1–34.0)
MCHC: 33.3 g/dL (ref 31.5–36.0)
MONO%: 5.5 % (ref 0.0–14.0)
NEUT%: 80 % — ABNORMAL HIGH (ref 38.4–76.8)
lymph#: 1 10*3/uL (ref 0.9–3.3)

## 2009-01-13 ENCOUNTER — Ambulatory Visit: Payer: Self-pay | Admitting: Oncology

## 2009-01-18 LAB — CBC WITH DIFFERENTIAL/PLATELET
BASO%: 0.8 % (ref 0.0–2.0)
Basophils Absolute: 0.1 10*3/uL (ref 0.0–0.1)
EOS%: 1.6 % (ref 0.0–7.0)
Eosinophils Absolute: 0.1 10*3/uL (ref 0.0–0.5)
HCT: 32.3 % — ABNORMAL LOW (ref 34.8–46.6)
HGB: 10.6 g/dL — ABNORMAL LOW (ref 11.6–15.9)
LYMPH%: 18.7 % (ref 14.0–49.7)
MCH: 31.7 pg (ref 25.1–34.0)
MCHC: 32.9 g/dL (ref 31.5–36.0)
MCV: 96.5 fL (ref 79.5–101.0)
MONO#: 0.2 10*3/uL (ref 0.1–0.9)
MONO%: 2.1 % (ref 0.0–14.0)
NEUT#: 6 10*3/uL (ref 1.5–6.5)
NEUT%: 76.8 % (ref 38.4–76.8)
Platelets: 102 10*3/uL — ABNORMAL LOW (ref 145–400)
RBC: 3.35 10*6/uL — ABNORMAL LOW (ref 3.70–5.45)
RDW: 15.3 % — ABNORMAL HIGH (ref 11.2–14.5)
WBC: 7.8 10*3/uL (ref 3.9–10.3)
lymph#: 1.4 10*3/uL (ref 0.9–3.3)

## 2009-01-27 LAB — CBC WITH DIFFERENTIAL/PLATELET
Basophils Absolute: 0 10*3/uL (ref 0.0–0.1)
Eosinophils Absolute: 0 10*3/uL (ref 0.0–0.5)
HGB: 10.4 g/dL — ABNORMAL LOW (ref 11.6–15.9)
MCV: 97.9 fL (ref 79.5–101.0)
MONO#: 0.6 10*3/uL (ref 0.1–0.9)
MONO%: 6.3 % (ref 0.0–14.0)
NEUT#: 7.4 10*3/uL — ABNORMAL HIGH (ref 1.5–6.5)
RBC: 3.35 10*6/uL — ABNORMAL LOW (ref 3.70–5.45)
RDW: 14.7 % — ABNORMAL HIGH (ref 11.2–14.5)
WBC: 9.5 10*3/uL (ref 3.9–10.3)
lymph#: 1.5 10*3/uL (ref 0.9–3.3)

## 2009-02-01 ENCOUNTER — Inpatient Hospital Stay (HOSPITAL_COMMUNITY): Admission: EM | Admit: 2009-02-01 | Discharge: 2009-02-03 | Payer: Self-pay | Admitting: Internal Medicine

## 2009-02-21 ENCOUNTER — Ambulatory Visit: Payer: Self-pay | Admitting: Oncology

## 2009-02-23 LAB — CBC WITH DIFFERENTIAL/PLATELET
Basophils Absolute: 0 10*3/uL (ref 0.0–0.1)
Eosinophils Absolute: 0.1 10*3/uL (ref 0.0–0.5)
HCT: 32.7 % — ABNORMAL LOW (ref 34.8–46.6)
HGB: 11 g/dL — ABNORMAL LOW (ref 11.6–15.9)
LYMPH%: 12.6 % — ABNORMAL LOW (ref 14.0–49.7)
MCV: 97.2 fL (ref 79.5–101.0)
MONO#: 0.7 10*3/uL (ref 0.1–0.9)
MONO%: 7.1 % (ref 0.0–14.0)
NEUT#: 8 10*3/uL — ABNORMAL HIGH (ref 1.5–6.5)
NEUT%: 79.4 % — ABNORMAL HIGH (ref 38.4–76.8)
Platelets: 135 10*3/uL — ABNORMAL LOW (ref 145–400)
RBC: 3.36 10*6/uL — ABNORMAL LOW (ref 3.70–5.45)
WBC: 10.1 10*3/uL (ref 3.9–10.3)

## 2009-02-23 LAB — COMPREHENSIVE METABOLIC PANEL
Alkaline Phosphatase: 39 U/L (ref 39–117)
BUN: 44 mg/dL — ABNORMAL HIGH (ref 6–23)
CO2: 25 mEq/L (ref 19–32)
Calcium: 9 mg/dL (ref 8.4–10.5)
Glucose, Bld: 120 mg/dL — ABNORMAL HIGH (ref 70–99)
Sodium: 137 mEq/L (ref 135–145)
Total Bilirubin: 0.6 mg/dL (ref 0.3–1.2)
Total Protein: 5.8 g/dL — ABNORMAL LOW (ref 6.0–8.3)

## 2009-03-21 ENCOUNTER — Ambulatory Visit: Payer: Self-pay | Admitting: Oncology

## 2009-03-23 LAB — CBC WITH DIFFERENTIAL/PLATELET
Basophils Absolute: 0 10*3/uL (ref 0.0–0.1)
EOS%: 0.8 % (ref 0.0–7.0)
HGB: 9.5 g/dL — ABNORMAL LOW (ref 11.6–15.9)
LYMPH%: 24 % (ref 14.0–49.7)
MCH: 32.3 pg (ref 25.1–34.0)
MCV: 95.7 fL (ref 79.5–101.0)
MONO%: 8.3 % (ref 0.0–14.0)
NEUT%: 66.5 % (ref 38.4–76.8)
Platelets: 122 10*3/uL — ABNORMAL LOW (ref 145–400)
RDW: 14.5 % (ref 11.2–14.5)

## 2009-03-23 LAB — COMPREHENSIVE METABOLIC PANEL
AST: 12 U/L (ref 0–37)
Alkaline Phosphatase: 57 U/L (ref 39–117)
BUN: 37 mg/dL — ABNORMAL HIGH (ref 6–23)
Creatinine, Ser: 1.65 mg/dL — ABNORMAL HIGH (ref 0.40–1.20)
Glucose, Bld: 136 mg/dL — ABNORMAL HIGH (ref 70–99)
Total Bilirubin: 0.3 mg/dL (ref 0.3–1.2)

## 2009-04-20 ENCOUNTER — Ambulatory Visit: Payer: Self-pay | Admitting: Oncology

## 2009-04-20 LAB — CBC WITH DIFFERENTIAL/PLATELET
Eosinophils Absolute: 0 10*3/uL (ref 0.0–0.5)
HCT: 36 % (ref 34.8–46.6)
HGB: 12.2 g/dL (ref 11.6–15.9)
MCH: 32.6 pg (ref 25.1–34.0)
MCHC: 33.8 g/dL (ref 31.5–36.0)
MONO%: 2.8 % (ref 0.0–14.0)
Platelets: 95 10*3/uL — ABNORMAL LOW (ref 145–400)
lymph#: 1.1 10*3/uL (ref 0.9–3.3)

## 2009-05-16 ENCOUNTER — Ambulatory Visit: Payer: Self-pay | Admitting: Oncology

## 2009-05-18 LAB — CBC WITH DIFFERENTIAL/PLATELET
Basophils Absolute: 0 10*3/uL (ref 0.0–0.1)
Eosinophils Absolute: 0.1 10*3/uL (ref 0.0–0.5)
HCT: 32.5 % — ABNORMAL LOW (ref 34.8–46.6)
HGB: 10.3 g/dL — ABNORMAL LOW (ref 11.6–15.9)
LYMPH%: 30.3 % (ref 14.0–49.7)
MCV: 95.9 fL (ref 79.5–101.0)
MONO%: 10.1 % (ref 0.0–14.0)
NEUT#: 4.7 10*3/uL (ref 1.5–6.5)
Platelets: 102 10*3/uL — ABNORMAL LOW (ref 145–400)

## 2009-05-20 ENCOUNTER — Inpatient Hospital Stay (HOSPITAL_COMMUNITY): Admission: EM | Admit: 2009-05-20 | Discharge: 2009-05-22 | Payer: Self-pay | Admitting: Emergency Medicine

## 2009-06-15 ENCOUNTER — Ambulatory Visit: Payer: Self-pay | Admitting: Oncology

## 2009-06-15 LAB — CBC WITH DIFFERENTIAL/PLATELET
Basophils Absolute: 0 10*3/uL (ref 0.0–0.1)
Eosinophils Absolute: 0.1 10*3/uL (ref 0.0–0.5)
HGB: 10.8 g/dL — ABNORMAL LOW (ref 11.6–15.9)
MCV: 96.6 fL (ref 79.5–101.0)
MONO%: 8.1 % (ref 0.0–14.0)
NEUT#: 3 10*3/uL (ref 1.5–6.5)
RDW: 15.5 % — ABNORMAL HIGH (ref 11.2–14.5)

## 2009-06-18 LAB — COMPREHENSIVE METABOLIC PANEL
Albumin: 3.6 g/dL (ref 3.5–5.2)
Alkaline Phosphatase: 36 U/L — ABNORMAL LOW (ref 39–117)
BUN: 37 mg/dL — ABNORMAL HIGH (ref 6–23)
Calcium: 8.7 mg/dL (ref 8.4–10.5)
Chloride: 101 mEq/L (ref 96–112)
Glucose, Bld: 129 mg/dL — ABNORMAL HIGH (ref 70–99)
Potassium: 4.6 mEq/L (ref 3.5–5.3)

## 2009-06-18 LAB — IRON AND TIBC: Iron: 87 ug/dL (ref 42–145)

## 2009-06-18 LAB — FERRITIN: Ferritin: 632 ng/mL — ABNORMAL HIGH (ref 10–291)

## 2009-06-18 LAB — VITAMIN B12: Vitamin B-12: 571 pg/mL (ref 211–911)

## 2009-07-13 LAB — CBC WITH DIFFERENTIAL/PLATELET
Basophils Absolute: 0 10*3/uL (ref 0.0–0.1)
EOS%: 2 % (ref 0.0–7.0)
Eosinophils Absolute: 0.1 10*3/uL (ref 0.0–0.5)
HGB: 11.8 g/dL (ref 11.6–15.9)
MONO#: 0.5 10*3/uL (ref 0.1–0.9)
NEUT#: 3.4 10*3/uL (ref 1.5–6.5)
RDW: 15.2 % — ABNORMAL HIGH (ref 11.2–14.5)
WBC: 5.7 10*3/uL (ref 3.9–10.3)
lymph#: 1.6 10*3/uL (ref 0.9–3.3)

## 2009-08-08 ENCOUNTER — Ambulatory Visit: Payer: Self-pay | Admitting: Oncology

## 2009-08-10 LAB — CBC WITH DIFFERENTIAL/PLATELET
BASO%: 0.3 % (ref 0.0–2.0)
EOS%: 1 % (ref 0.0–7.0)
MCH: 31.2 pg (ref 25.1–34.0)
MCHC: 33.5 g/dL (ref 31.5–36.0)
RDW: 13.8 % (ref 11.2–14.5)
lymph#: 1.5 10*3/uL (ref 0.9–3.3)

## 2009-09-07 ENCOUNTER — Ambulatory Visit: Payer: Self-pay | Admitting: Oncology

## 2009-09-07 LAB — COMPREHENSIVE METABOLIC PANEL
ALT: 9 U/L (ref 0–35)
Albumin: 3.8 g/dL (ref 3.5–5.2)
CO2: 28 mEq/L (ref 19–32)
Potassium: 4.4 mEq/L (ref 3.5–5.3)
Sodium: 138 mEq/L (ref 135–145)
Total Bilirubin: 0.4 mg/dL (ref 0.3–1.2)
Total Protein: 5.9 g/dL — ABNORMAL LOW (ref 6.0–8.3)

## 2009-09-07 LAB — CBC WITH DIFFERENTIAL/PLATELET
BASO%: 0.5 % (ref 0.0–2.0)
Eosinophils Absolute: 0.1 10*3/uL (ref 0.0–0.5)
LYMPH%: 29.7 % (ref 14.0–49.7)
MCHC: 34.8 g/dL (ref 31.5–36.0)
MONO#: 0.5 10*3/uL (ref 0.1–0.9)
NEUT#: 4.1 10*3/uL (ref 1.5–6.5)
RBC: 3.31 10*6/uL — ABNORMAL LOW (ref 3.70–5.45)
RDW: 14.2 % (ref 11.2–14.5)
WBC: 6.7 10*3/uL (ref 3.9–10.3)
lymph#: 2 10*3/uL (ref 0.9–3.3)

## 2009-09-21 LAB — CBC WITH DIFFERENTIAL/PLATELET
BASO%: 0.2 % (ref 0.0–2.0)
LYMPH%: 27 % (ref 14.0–49.7)
MCHC: 31.3 g/dL — ABNORMAL LOW (ref 31.5–36.0)
MCV: 97.4 fL (ref 79.5–101.0)
MONO#: 0.4 10*3/uL (ref 0.1–0.9)
MONO%: 8.1 % (ref 0.0–14.0)
Platelets: 116 10*3/uL — ABNORMAL LOW (ref 145–400)
RBC: 3.84 10*6/uL (ref 3.70–5.45)
RDW: 16.4 % — ABNORMAL HIGH (ref 11.2–14.5)
WBC: 4.3 10*3/uL (ref 3.9–10.3)
nRBC: 0 % (ref 0–0)

## 2009-10-04 ENCOUNTER — Encounter: Admission: RE | Admit: 2009-10-04 | Discharge: 2009-10-04 | Payer: Self-pay | Admitting: Oncology

## 2009-10-05 LAB — CBC WITH DIFFERENTIAL/PLATELET
BASO%: 0.5 % (ref 0.0–2.0)
EOS%: 0.9 % (ref 0.0–7.0)
LYMPH%: 31 % (ref 14.0–49.7)
MCHC: 34.4 g/dL (ref 31.5–36.0)
MCV: 92.8 fL (ref 79.5–101.0)
MONO%: 9.5 % (ref 0.0–14.0)
NEUT#: 3.8 10*3/uL (ref 1.5–6.5)
Platelets: 119 10*3/uL — ABNORMAL LOW (ref 145–400)
RBC: 3.55 10*6/uL — ABNORMAL LOW (ref 3.70–5.45)
RDW: 14.9 % — ABNORMAL HIGH (ref 11.2–14.5)

## 2009-10-27 ENCOUNTER — Ambulatory Visit: Payer: Self-pay | Admitting: Oncology

## 2009-11-02 LAB — CBC WITH DIFFERENTIAL/PLATELET
Basophils Absolute: 0 10*3/uL (ref 0.0–0.1)
EOS%: 1.5 % (ref 0.0–7.0)
Eosinophils Absolute: 0.1 10*3/uL (ref 0.0–0.5)
HGB: 9.7 g/dL — ABNORMAL LOW (ref 11.6–15.9)
LYMPH%: 37.3 % (ref 14.0–49.7)
MCH: 31.9 pg (ref 25.1–34.0)
MCV: 93.7 fL (ref 79.5–101.0)
MONO%: 10 % (ref 0.0–14.0)
NEUT#: 2.6 10*3/uL (ref 1.5–6.5)
Platelets: 89 10*3/uL — ABNORMAL LOW (ref 145–400)
RBC: 3.03 10*6/uL — ABNORMAL LOW (ref 3.70–5.45)
RDW: 14.1 % (ref 11.2–14.5)

## 2009-11-28 ENCOUNTER — Ambulatory Visit: Payer: Self-pay | Admitting: Oncology

## 2009-11-30 LAB — CBC WITH DIFFERENTIAL/PLATELET
Basophils Absolute: 0.1 10*3/uL (ref 0.0–0.1)
EOS%: 1.1 % (ref 0.0–7.0)
Eosinophils Absolute: 0.1 10*3/uL (ref 0.0–0.5)
HCT: 31 % — ABNORMAL LOW (ref 34.8–46.6)
HGB: 10.6 g/dL — ABNORMAL LOW (ref 11.6–15.9)
MCH: 32 pg (ref 25.1–34.0)
MCV: 93.8 fL (ref 79.5–101.0)
MONO%: 10.2 % (ref 0.0–14.0)
NEUT#: 2.1 10*3/uL (ref 1.5–6.5)
NEUT%: 44.9 % (ref 38.4–76.8)

## 2009-12-28 ENCOUNTER — Other Ambulatory Visit: Payer: Self-pay | Admitting: Oncology

## 2009-12-28 LAB — CBC WITH DIFFERENTIAL/PLATELET
BASO%: 0.4 % (ref 0.0–2.0)
EOS%: 0.7 % (ref 0.0–7.0)
HCT: 34.9 % (ref 34.8–46.6)
LYMPH%: 19.1 % (ref 14.0–49.7)
MCH: 31.1 pg (ref 25.1–34.0)
MCHC: 33.4 g/dL (ref 31.5–36.0)
MONO#: 0.5 10*3/uL (ref 0.1–0.9)
NEUT%: 73.1 % (ref 38.4–76.8)
Platelets: 114 10*3/uL — ABNORMAL LOW (ref 145–400)

## 2009-12-28 LAB — IRON AND TIBC
%SAT: 30 % (ref 20–55)
TIBC: 297 ug/dL (ref 250–470)
UIBC: 207 ug/dL

## 2009-12-28 LAB — LACTATE DEHYDROGENASE: LDH: 173 U/L (ref 94–250)

## 2009-12-28 LAB — COMPREHENSIVE METABOLIC PANEL
ALT: 11 U/L (ref 0–35)
CO2: 27 mEq/L (ref 19–32)
Creatinine, Ser: 1.96 mg/dL — ABNORMAL HIGH (ref 0.40–1.20)
Total Bilirubin: 0.6 mg/dL (ref 0.3–1.2)

## 2010-01-23 ENCOUNTER — Ambulatory Visit: Payer: Self-pay | Admitting: Oncology

## 2010-01-25 LAB — CBC WITH DIFFERENTIAL/PLATELET
Eosinophils Absolute: 0.1 10*3/uL (ref 0.0–0.5)
HGB: 11 g/dL — ABNORMAL LOW (ref 11.6–15.9)
MONO#: 0.5 10*3/uL (ref 0.1–0.9)
NEUT#: 2.1 10*3/uL (ref 1.5–6.5)
Platelets: 101 10*3/uL — ABNORMAL LOW (ref 145–400)
RBC: 3.55 10*6/uL — ABNORMAL LOW (ref 3.70–5.45)
RDW: 14.2 % (ref 11.2–14.5)
WBC: 5.3 10*3/uL (ref 3.9–10.3)

## 2010-02-22 ENCOUNTER — Ambulatory Visit: Payer: Self-pay | Admitting: Oncology

## 2010-02-22 LAB — CBC WITH DIFFERENTIAL/PLATELET
Basophils Absolute: 0 10*3/uL (ref 0.0–0.1)
Eosinophils Absolute: 0 10*3/uL (ref 0.0–0.5)
HCT: 34.7 % — ABNORMAL LOW (ref 34.8–46.6)
LYMPH%: 44.5 % (ref 14.0–49.7)
MONO#: 0.7 10*3/uL (ref 0.1–0.9)
NEUT#: 2.6 10*3/uL (ref 1.5–6.5)
NEUT%: 43 % (ref 38.4–76.8)
Platelets: 100 10*3/uL — ABNORMAL LOW (ref 145–400)
WBC: 6 10*3/uL (ref 3.9–10.3)

## 2010-03-18 ENCOUNTER — Encounter: Payer: Self-pay | Admitting: Family Medicine

## 2010-03-22 LAB — CBC WITH DIFFERENTIAL/PLATELET
Basophils Absolute: 0 10*3/uL (ref 0.0–0.1)
EOS%: 1.7 % (ref 0.0–7.0)
Eosinophils Absolute: 0.1 10*3/uL (ref 0.0–0.5)
HCT: 31.3 % — ABNORMAL LOW (ref 34.8–46.6)
HGB: 10.8 g/dL — ABNORMAL LOW (ref 11.6–15.9)
MCH: 31.3 pg (ref 25.1–34.0)
MCV: 90.4 fL (ref 79.5–101.0)
NEUT%: 46.2 % (ref 38.4–76.8)
lymph#: 2.1 10*3/uL (ref 0.9–3.3)

## 2010-04-19 ENCOUNTER — Encounter (HOSPITAL_BASED_OUTPATIENT_CLINIC_OR_DEPARTMENT_OTHER): Payer: Medicare Other | Admitting: Oncology

## 2010-04-19 ENCOUNTER — Other Ambulatory Visit: Payer: Self-pay | Admitting: Oncology

## 2010-04-19 DIAGNOSIS — N189 Chronic kidney disease, unspecified: Secondary | ICD-10-CM

## 2010-04-19 DIAGNOSIS — D631 Anemia in chronic kidney disease: Secondary | ICD-10-CM

## 2010-04-19 DIAGNOSIS — E538 Deficiency of other specified B group vitamins: Secondary | ICD-10-CM

## 2010-04-19 LAB — CBC WITH DIFFERENTIAL/PLATELET
BASO%: 0.6 % (ref 0.0–2.0)
EOS%: 1.8 % (ref 0.0–7.0)
Eosinophils Absolute: 0.1 10*3/uL (ref 0.0–0.5)
MCH: 31.6 pg (ref 25.1–34.0)
MCHC: 33.7 g/dL (ref 31.5–36.0)
MCV: 93.6 fL (ref 79.5–101.0)
MONO%: 13.7 % (ref 0.0–14.0)
NEUT#: 1.5 10*3/uL (ref 1.5–6.5)
RBC: 3.39 10*6/uL — ABNORMAL LOW (ref 3.70–5.45)
RDW: 14.1 % (ref 11.2–14.5)

## 2010-05-18 ENCOUNTER — Other Ambulatory Visit: Payer: Self-pay | Admitting: Oncology

## 2010-05-18 ENCOUNTER — Encounter (HOSPITAL_BASED_OUTPATIENT_CLINIC_OR_DEPARTMENT_OTHER): Payer: Medicare Other | Admitting: Oncology

## 2010-05-18 DIAGNOSIS — N189 Chronic kidney disease, unspecified: Secondary | ICD-10-CM

## 2010-05-18 DIAGNOSIS — E538 Deficiency of other specified B group vitamins: Secondary | ICD-10-CM

## 2010-05-18 DIAGNOSIS — N039 Chronic nephritic syndrome with unspecified morphologic changes: Secondary | ICD-10-CM

## 2010-05-18 DIAGNOSIS — D696 Thrombocytopenia, unspecified: Secondary | ICD-10-CM

## 2010-05-18 LAB — CBC WITH DIFFERENTIAL/PLATELET
BASO%: 1.5 % (ref 0.0–2.0)
Eosinophils Absolute: 0.1 10*3/uL (ref 0.0–0.5)
MONO#: 0.5 10*3/uL (ref 0.1–0.9)
NEUT#: 3.6 10*3/uL (ref 1.5–6.5)
RBC: 4.13 10*6/uL (ref 3.70–5.45)
RDW: 14.2 % (ref 11.2–14.5)
WBC: 6.1 10*3/uL (ref 3.9–10.3)

## 2010-05-18 LAB — BASIC METABOLIC PANEL
BUN: 26 mg/dL — ABNORMAL HIGH (ref 6–23)
CO2: 28 mEq/L (ref 19–32)
Calcium: 8.4 mg/dL (ref 8.4–10.5)
Calcium: 9.4 mg/dL (ref 8.4–10.5)
Creatinine, Ser: 1.74 mg/dL — ABNORMAL HIGH (ref 0.4–1.2)
GFR calc Af Amer: 35 mL/min — ABNORMAL LOW (ref >60)
GFR calc non Af Amer: 25 mL/min — ABNORMAL LOW (ref >60)
GFR calc non Af Amer: 29 mL/min — ABNORMAL LOW (ref >60)
Glucose, Bld: 87 mg/dL (ref 70–99)
Potassium: 4.1 mEq/L (ref 3.5–5.1)
Sodium: 136 mEq/L (ref 135–145)
Sodium: 138 mEq/L (ref 135–145)

## 2010-05-18 LAB — URINALYSIS, ROUTINE W REFLEX MICROSCOPIC
Ketones, ur: NEGATIVE mg/dL
Nitrite: NEGATIVE
Protein, ur: NEGATIVE mg/dL
Urobilinogen, UA: 0.2 mg/dL (ref 0.0–1.0)

## 2010-05-18 LAB — DIFFERENTIAL
Eosinophils Absolute: 0.1 10*3/uL (ref 0.0–0.7)
Lymphocytes Relative: 13 % (ref 12–46)
Lymphs Abs: 1.2 10*3/uL (ref 0.7–4.0)
Monocytes Relative: 7 % (ref 3–12)
Neutrophils Relative %: 79 % — ABNORMAL HIGH (ref 43–77)

## 2010-05-18 LAB — URINE CULTURE: Colony Count: >=100000

## 2010-05-18 LAB — COMPREHENSIVE METABOLIC PANEL
ALT: 12 U/L (ref 0–35)
Calcium: 9.4 mg/dL (ref 8.4–10.5)
Creatinine, Ser: 2.29 mg/dL — ABNORMAL HIGH (ref 0.4–1.2)
GFR calc Af Amer: 25 mL/min — ABNORMAL LOW (ref >60)
GFR calc non Af Amer: 21 mL/min — ABNORMAL LOW (ref >60)
Glucose, Bld: 172 mg/dL — ABNORMAL HIGH (ref 70–99)
Sodium: 130 mEq/L — ABNORMAL LOW (ref 135–145)
Total Protein: 5.4 g/dL — ABNORMAL LOW (ref 6.0–8.3)

## 2010-05-18 LAB — MORPHOLOGY: PLT EST: DECREASED

## 2010-05-18 LAB — CBC
HCT: 31.2 % — ABNORMAL LOW (ref 36.0–46.0)
Hemoglobin: 10 g/dL — ABNORMAL LOW (ref 12.0–15.0)
MCHC: 33.7 g/dL (ref 30.0–36.0)
MCV: 94.8 fL (ref 78.0–100.0)
Platelets: 98 10*3/uL — ABNORMAL LOW (ref 150–400)
RDW: 14.1 % (ref 11.5–15.5)
RDW: 14.2 % (ref 11.5–15.5)
WBC: 8.3 10*3/uL (ref 4.0–10.5)

## 2010-05-18 LAB — URINE MICROSCOPIC-ADD ON

## 2010-05-18 LAB — CHCC SMEAR

## 2010-05-18 LAB — PHOSPHORUS: Phosphorus: 2.8 mg/dL (ref 2.3–4.6)

## 2010-05-18 LAB — HEMOGLOBIN A1C: Mean Plasma Glucose: 157 mg/dL

## 2010-05-21 ENCOUNTER — Other Ambulatory Visit: Payer: Self-pay | Admitting: Oncology

## 2010-05-22 LAB — COMPREHENSIVE METABOLIC PANEL
ALT: 11 U/L (ref 0–35)
Alkaline Phosphatase: 34 U/L — ABNORMAL LOW (ref 39–117)
CO2: 27 mEq/L (ref 19–32)
Creatinine, Ser: 2.59 mg/dL — ABNORMAL HIGH (ref 0.40–1.20)
Total Bilirubin: 0.8 mg/dL (ref 0.3–1.2)

## 2010-05-22 LAB — IRON AND TIBC
%SAT: 23 % (ref 20–55)
TIBC: 294 ug/dL (ref 250–470)
UIBC: 225 ug/dL

## 2010-05-22 LAB — LACTATE DEHYDROGENASE: LDH: 142 U/L (ref 94–250)

## 2010-05-22 LAB — METHYLMALONIC ACID, SERUM: Methylmalonic Acid, Quantitative: 544 nmol/L — ABNORMAL HIGH (ref 87–318)

## 2010-05-22 LAB — FERRITIN: Ferritin: 1086 ng/mL — ABNORMAL HIGH (ref 10–291)

## 2010-05-23 LAB — PROTEIN ELECTROPHORESIS, SERUM
Alpha-2-Globulin: 11.9 % — ABNORMAL HIGH (ref 7.1–11.8)
Beta Globulin: 6.7 % (ref 4.7–7.2)
Gamma Globulin: 11.9 % (ref 11.1–18.8)

## 2010-05-29 LAB — COMPREHENSIVE METABOLIC PANEL
AST: 24 U/L (ref 0–37)
Albumin: 3.8 g/dL (ref 3.5–5.2)
Alkaline Phosphatase: 48 U/L (ref 39–117)
Chloride: 96 mEq/L (ref 96–112)
GFR calc Af Amer: 39 mL/min — ABNORMAL LOW (ref >60)
Potassium: 4 mEq/L (ref 3.5–5.1)
Sodium: 137 mEq/L (ref 135–145)
Total Bilirubin: 0.7 mg/dL (ref 0.3–1.2)

## 2010-05-29 LAB — DIFFERENTIAL
Basophils Absolute: 0 10*3/uL (ref 0.0–0.1)
Eosinophils Relative: 0 % (ref 0–5)
Lymphocytes Relative: 14 % (ref 12–46)
Monocytes Absolute: 0.5 10*3/uL (ref 0.1–1.0)

## 2010-05-29 LAB — RETICULOCYTES
RBC.: 3.37 MIL/uL — ABNORMAL LOW (ref 3.87–5.11)
Retic Count, Absolute: 97.7 10*3/uL (ref 19.0–186.0)
Retic Ct Pct: 2.9 % (ref 0.4–3.1)

## 2010-05-29 LAB — URINE MICROSCOPIC-ADD ON

## 2010-05-29 LAB — BASIC METABOLIC PANEL
BUN: 22 mg/dL (ref 6–23)
CO2: 28 mEq/L (ref 19–32)
Calcium: 8.2 mg/dL — ABNORMAL LOW (ref 8.4–10.5)
Calcium: 8.6 mg/dL (ref 8.4–10.5)
Creatinine, Ser: 1.45 mg/dL — ABNORMAL HIGH (ref 0.4–1.2)
GFR calc non Af Amer: 35 mL/min — ABNORMAL LOW (ref >60)
Glucose, Bld: 99 mg/dL (ref 70–99)
Potassium: 4.9 mEq/L (ref 3.5–5.1)
Sodium: 137 mEq/L (ref 135–145)

## 2010-05-29 LAB — GLUCOSE, CAPILLARY
Glucose-Capillary: 110 mg/dL — ABNORMAL HIGH (ref 70–99)
Glucose-Capillary: 114 mg/dL — ABNORMAL HIGH (ref 70–99)
Glucose-Capillary: 117 mg/dL — ABNORMAL HIGH (ref 70–99)
Glucose-Capillary: 153 mg/dL — ABNORMAL HIGH (ref 70–99)

## 2010-05-29 LAB — LIPID PANEL
Total CHOL/HDL Ratio: 1.7 RATIO
VLDL: 17 mg/dL (ref 0–40)

## 2010-05-29 LAB — CBC
Platelets: 111 10*3/uL — ABNORMAL LOW (ref 150–400)
Platelets: 90 10*3/uL — ABNORMAL LOW (ref 150–400)
WBC: 11.5 10*3/uL — ABNORMAL HIGH (ref 4.0–10.5)
WBC: 5.1 10*3/uL (ref 4.0–10.5)

## 2010-05-29 LAB — URINALYSIS, ROUTINE W REFLEX MICROSCOPIC
Bilirubin Urine: NEGATIVE
Hgb urine dipstick: NEGATIVE
Specific Gravity, Urine: 1.016 (ref 1.005–1.030)
Urobilinogen, UA: 1 mg/dL (ref 0.0–1.0)

## 2010-05-29 LAB — IRON AND TIBC: UIBC: 234 ug/dL

## 2010-05-29 LAB — HEMOGLOBIN A1C: Mean Plasma Glucose: 169 mg/dL

## 2010-05-29 LAB — VITAMIN B12: Vitamin B-12: 859 pg/mL (ref 211–911)

## 2010-06-05 LAB — CBC
HCT: 18.6 % — ABNORMAL LOW (ref 36.0–46.0)
HCT: 24.4 % — ABNORMAL LOW (ref 36.0–46.0)
HCT: 24.8 % — ABNORMAL LOW (ref 36.0–46.0)
HCT: 26.3 % — ABNORMAL LOW (ref 36.0–46.0)
HCT: 28.7 % — ABNORMAL LOW (ref 36.0–46.0)
HCT: 32.6 % — ABNORMAL LOW (ref 36.0–46.0)
HCT: 31.7 % — ABNORMAL LOW (ref 36.0–46.0)
HCT: 31.9 % — ABNORMAL LOW (ref 36.0–46.0)
HCT: 35.1 % — ABNORMAL LOW (ref 36.0–46.0)
Hemoglobin: 11.3 g/dL — ABNORMAL LOW (ref 12.0–15.0)
Hemoglobin: 6.3 g/dL — CL (ref 12.0–15.0)
Hemoglobin: 7 g/dL — CL (ref 12.0–15.0)
Hemoglobin: 8.3 g/dL — ABNORMAL LOW (ref 12.0–15.0)
Hemoglobin: 9 g/dL — ABNORMAL LOW (ref 12.0–15.0)
Hemoglobin: 10.9 g/dL — ABNORMAL LOW (ref 12.0–15.0)
Hemoglobin: 9.4 g/dL — ABNORMAL LOW (ref 12.0–15.0)
Hemoglobin: 9.7 g/dL — ABNORMAL LOW (ref 12.0–15.0)
MCHC: 33.1 g/dL (ref 30.0–36.0)
MCHC: 34.1 g/dL (ref 30.0–36.0)
MCHC: 34.2 g/dL (ref 30.0–36.0)
MCHC: 34.3 g/dL (ref 30.0–36.0)
MCHC: 34.6 g/dL (ref 30.0–36.0)
MCHC: 34.8 g/dL (ref 30.0–36.0)
MCHC: 33.6 g/dL (ref 30.0–36.0)
MCHC: 33.7 g/dL (ref 30.0–36.0)
MCV: 88.4 fL (ref 78.0–100.0)
MCV: 91.8 fL (ref 78.0–100.0)
MCV: 92.8 fL (ref 78.0–100.0)
MCV: 92.7 fL (ref 78.0–100.0)
MCV: 92.9 fL (ref 78.0–100.0)
MCV: 93.1 fL (ref 78.0–100.0)
MCV: 93.3 fL (ref 78.0–100.0)
Platelets: 148 10*3/uL — ABNORMAL LOW (ref 150–400)
Platelets: 150 10*3/uL (ref 150–400)
Platelets: 172 10*3/uL (ref 150–400)
Platelets: 111 10*3/uL — ABNORMAL LOW (ref 150–400)
Platelets: 96 10*3/uL — ABNORMAL LOW (ref 150–400)
Platelets: 99 10*3/uL — ABNORMAL LOW (ref 150–400)
RBC: 2.06 MIL/uL — ABNORMAL LOW (ref 3.87–5.11)
RBC: 2.23 MIL/uL — ABNORMAL LOW (ref 3.87–5.11)
RBC: 2.6 MIL/uL — ABNORMAL LOW (ref 3.87–5.11)
RBC: 2.64 MIL/uL — ABNORMAL LOW (ref 3.87–5.11)
RBC: 2.81 MIL/uL — ABNORMAL LOW (ref 3.87–5.11)
RBC: 2.86 MIL/uL — ABNORMAL LOW (ref 3.87–5.11)
RBC: 2.97 MIL/uL — ABNORMAL LOW (ref 3.87–5.11)
RBC: 3 MIL/uL — ABNORMAL LOW (ref 3.87–5.11)
RBC: 2.98 MIL/uL — ABNORMAL LOW (ref 3.87–5.11)
RBC: 3.01 MIL/uL — ABNORMAL LOW (ref 3.87–5.11)
RBC: 3.14 MIL/uL — ABNORMAL LOW (ref 3.87–5.11)
RBC: 3.42 MIL/uL — ABNORMAL LOW (ref 3.87–5.11)
RBC: 3.43 MIL/uL — ABNORMAL LOW (ref 3.87–5.11)
RDW: 15.6 % — ABNORMAL HIGH (ref 11.5–15.5)
RDW: 16.8 % — ABNORMAL HIGH (ref 11.5–15.5)
RDW: 16.9 % — ABNORMAL HIGH (ref 11.5–15.5)
RDW: 17.9 % — ABNORMAL HIGH (ref 11.5–15.5)
RDW: 15.7 % — ABNORMAL HIGH (ref 11.5–15.5)
RDW: 16.6 % — ABNORMAL HIGH (ref 11.5–15.5)
WBC: 3.9 10*3/uL — ABNORMAL LOW (ref 4.0–10.5)
WBC: 4.4 10*3/uL (ref 4.0–10.5)
WBC: 4.4 10*3/uL (ref 4.0–10.5)
WBC: 5.5 10*3/uL (ref 4.0–10.5)
WBC: 5.8 10*3/uL (ref 4.0–10.5)
WBC: 9.7 10*3/uL (ref 4.0–10.5)
WBC: 11.9 10*3/uL — ABNORMAL HIGH (ref 4.0–10.5)
WBC: 12.4 10*3/uL — ABNORMAL HIGH (ref 4.0–10.5)
WBC: 14.9 10*3/uL — ABNORMAL HIGH (ref 4.0–10.5)
WBC: 15.2 10*3/uL — ABNORMAL HIGH (ref 4.0–10.5)
WBC: 17.5 10*3/uL — ABNORMAL HIGH (ref 4.0–10.5)
WBC: 6.2 10*3/uL (ref 4.0–10.5)

## 2010-06-05 LAB — GLUCOSE, CAPILLARY
Glucose-Capillary: 100 mg/dL — ABNORMAL HIGH (ref 70–99)
Glucose-Capillary: 101 mg/dL — ABNORMAL HIGH (ref 70–99)
Glucose-Capillary: 105 mg/dL — ABNORMAL HIGH (ref 70–99)
Glucose-Capillary: 106 mg/dL — ABNORMAL HIGH (ref 70–99)
Glucose-Capillary: 108 mg/dL — ABNORMAL HIGH (ref 70–99)
Glucose-Capillary: 110 mg/dL — ABNORMAL HIGH (ref 70–99)
Glucose-Capillary: 114 mg/dL — ABNORMAL HIGH (ref 70–99)
Glucose-Capillary: 117 mg/dL — ABNORMAL HIGH (ref 70–99)
Glucose-Capillary: 117 mg/dL — ABNORMAL HIGH (ref 70–99)
Glucose-Capillary: 119 mg/dL — ABNORMAL HIGH (ref 70–99)
Glucose-Capillary: 119 mg/dL — ABNORMAL HIGH (ref 70–99)
Glucose-Capillary: 121 mg/dL — ABNORMAL HIGH (ref 70–99)
Glucose-Capillary: 122 mg/dL — ABNORMAL HIGH (ref 70–99)
Glucose-Capillary: 122 mg/dL — ABNORMAL HIGH (ref 70–99)
Glucose-Capillary: 128 mg/dL — ABNORMAL HIGH (ref 70–99)
Glucose-Capillary: 129 mg/dL — ABNORMAL HIGH (ref 70–99)
Glucose-Capillary: 135 mg/dL — ABNORMAL HIGH (ref 70–99)
Glucose-Capillary: 135 mg/dL — ABNORMAL HIGH (ref 70–99)
Glucose-Capillary: 135 mg/dL — ABNORMAL HIGH (ref 70–99)
Glucose-Capillary: 137 mg/dL — ABNORMAL HIGH (ref 70–99)
Glucose-Capillary: 137 mg/dL — ABNORMAL HIGH (ref 70–99)
Glucose-Capillary: 138 mg/dL — ABNORMAL HIGH (ref 70–99)
Glucose-Capillary: 139 mg/dL — ABNORMAL HIGH (ref 70–99)
Glucose-Capillary: 139 mg/dL — ABNORMAL HIGH (ref 70–99)
Glucose-Capillary: 141 mg/dL — ABNORMAL HIGH (ref 70–99)
Glucose-Capillary: 143 mg/dL — ABNORMAL HIGH (ref 70–99)
Glucose-Capillary: 144 mg/dL — ABNORMAL HIGH (ref 70–99)
Glucose-Capillary: 145 mg/dL — ABNORMAL HIGH (ref 70–99)
Glucose-Capillary: 146 mg/dL — ABNORMAL HIGH (ref 70–99)
Glucose-Capillary: 151 mg/dL — ABNORMAL HIGH (ref 70–99)
Glucose-Capillary: 194 mg/dL — ABNORMAL HIGH (ref 70–99)
Glucose-Capillary: 74 mg/dL (ref 70–99)
Glucose-Capillary: 80 mg/dL (ref 70–99)
Glucose-Capillary: 88 mg/dL (ref 70–99)
Glucose-Capillary: 90 mg/dL (ref 70–99)
Glucose-Capillary: 90 mg/dL (ref 70–99)
Glucose-Capillary: 91 mg/dL (ref 70–99)
Glucose-Capillary: 92 mg/dL (ref 70–99)
Glucose-Capillary: 93 mg/dL (ref 70–99)
Glucose-Capillary: 95 mg/dL (ref 70–99)
Glucose-Capillary: 97 mg/dL (ref 70–99)
Glucose-Capillary: 104 mg/dL — ABNORMAL HIGH (ref 70–99)
Glucose-Capillary: 125 mg/dL — ABNORMAL HIGH (ref 70–99)
Glucose-Capillary: 131 mg/dL — ABNORMAL HIGH (ref 70–99)
Glucose-Capillary: 140 mg/dL — ABNORMAL HIGH (ref 70–99)
Glucose-Capillary: 66 mg/dL — ABNORMAL LOW (ref 70–99)
Glucose-Capillary: 69 mg/dL — ABNORMAL LOW (ref 70–99)
Glucose-Capillary: 70 mg/dL (ref 70–99)
Glucose-Capillary: 75 mg/dL (ref 70–99)
Glucose-Capillary: 77 mg/dL (ref 70–99)
Glucose-Capillary: 82 mg/dL (ref 70–99)
Glucose-Capillary: 83 mg/dL (ref 70–99)
Glucose-Capillary: 84 mg/dL (ref 70–99)
Glucose-Capillary: 84 mg/dL (ref 70–99)
Glucose-Capillary: 87 mg/dL (ref 70–99)
Glucose-Capillary: 90 mg/dL (ref 70–99)
Glucose-Capillary: 95 mg/dL (ref 70–99)
Glucose-Capillary: 95 mg/dL (ref 70–99)

## 2010-06-05 LAB — COMPREHENSIVE METABOLIC PANEL
ALT: 12 U/L (ref 0–35)
ALT: 58 U/L — ABNORMAL HIGH (ref 0–35)
ALT: 22 U/L (ref 0–35)
AST: 15 U/L (ref 0–37)
AST: 417 U/L — ABNORMAL HIGH (ref 0–37)
AST: 108 U/L — ABNORMAL HIGH (ref 0–37)
AST: 20 U/L (ref 0–37)
AST: 39 U/L — ABNORMAL HIGH (ref 0–37)
Albumin: 2.4 g/dL — ABNORMAL LOW (ref 3.5–5.2)
Albumin: 2.5 g/dL — ABNORMAL LOW (ref 3.5–5.2)
Albumin: 2.5 g/dL — ABNORMAL LOW (ref 3.5–5.2)
Albumin: 3 g/dL — ABNORMAL LOW (ref 3.5–5.2)
Albumin: 3.8 g/dL (ref 3.5–5.2)
Alkaline Phosphatase: 144 U/L — ABNORMAL HIGH (ref 39–117)
Alkaline Phosphatase: 36 U/L — ABNORMAL LOW (ref 39–117)
Alkaline Phosphatase: 38 U/L — ABNORMAL LOW (ref 39–117)
Alkaline Phosphatase: 41 U/L (ref 39–117)
Alkaline Phosphatase: 48 U/L (ref 39–117)
Alkaline Phosphatase: 48 U/L (ref 39–117)
BUN: 21 mg/dL (ref 6–23)
BUN: 24 mg/dL — ABNORMAL HIGH (ref 6–23)
BUN: 24 mg/dL — ABNORMAL HIGH (ref 6–23)
BUN: 28 mg/dL — ABNORMAL HIGH (ref 6–23)
BUN: 30 mg/dL — ABNORMAL HIGH (ref 6–23)
BUN: 8 mg/dL (ref 6–23)
BUN: 8 mg/dL (ref 6–23)
BUN: 17 mg/dL (ref 6–23)
BUN: 18 mg/dL (ref 6–23)
BUN: 22 mg/dL (ref 6–23)
CO2: 24 mEq/L (ref 19–32)
CO2: 26 mEq/L (ref 19–32)
CO2: 21 mEq/L (ref 19–32)
CO2: 23 mEq/L (ref 19–32)
CO2: 23 mEq/L (ref 19–32)
Calcium: 7.6 mg/dL — ABNORMAL LOW (ref 8.4–10.5)
Calcium: 8.4 mg/dL (ref 8.4–10.5)
Calcium: 8.4 mg/dL (ref 8.4–10.5)
Calcium: 9.1 mg/dL (ref 8.4–10.5)
Calcium: 9.5 mg/dL (ref 8.4–10.5)
Chloride: 104 mEq/L (ref 96–112)
Chloride: 106 mEq/L (ref 96–112)
Chloride: 106 mEq/L (ref 96–112)
Chloride: 106 mEq/L (ref 96–112)
Chloride: 102 mEq/L (ref 96–112)
Chloride: 103 mEq/L (ref 96–112)
Chloride: 108 mEq/L (ref 96–112)
Creatinine, Ser: 1.05 mg/dL (ref 0.4–1.2)
Creatinine, Ser: 1.06 mg/dL (ref 0.4–1.2)
Creatinine, Ser: 1.12 mg/dL (ref 0.4–1.2)
Creatinine, Ser: 1.24 mg/dL — ABNORMAL HIGH (ref 0.4–1.2)
Creatinine, Ser: 1.42 mg/dL — ABNORMAL HIGH (ref 0.4–1.2)
Creatinine, Ser: 1.28 mg/dL — ABNORMAL HIGH (ref 0.4–1.2)
Creatinine, Ser: 1.3 mg/dL — ABNORMAL HIGH (ref 0.4–1.2)
Creatinine, Ser: 1.33 mg/dL — ABNORMAL HIGH (ref 0.4–1.2)
GFR calc Af Amer: 48 mL/min — ABNORMAL LOW (ref >60)
GFR calc Af Amer: 49 mL/min — ABNORMAL LOW (ref >60)
GFR calc Af Amer: 59 mL/min — ABNORMAL LOW (ref >60)
GFR calc non Af Amer: 48 mL/min — ABNORMAL LOW (ref >60)
GFR calc non Af Amer: 51 mL/min — ABNORMAL LOW (ref >60)
GFR calc non Af Amer: 53 mL/min — ABNORMAL LOW (ref >60)
GFR calc non Af Amer: 54 mL/min — ABNORMAL LOW (ref >60)
GFR calc non Af Amer: 39 mL/min — ABNORMAL LOW (ref >60)
GFR calc non Af Amer: 40 mL/min — ABNORMAL LOW (ref >60)
GFR calc non Af Amer: 49 mL/min — ABNORMAL LOW (ref >60)
Glucose, Bld: 116 mg/dL — ABNORMAL HIGH (ref 70–99)
Glucose, Bld: 118 mg/dL — ABNORMAL HIGH (ref 70–99)
Glucose, Bld: 147 mg/dL — ABNORMAL HIGH (ref 70–99)
Glucose, Bld: 205 mg/dL — ABNORMAL HIGH (ref 70–99)
Glucose, Bld: 76 mg/dL (ref 70–99)
Glucose, Bld: 125 mg/dL — ABNORMAL HIGH (ref 70–99)
Glucose, Bld: 92 mg/dL (ref 70–99)
Glucose, Bld: 99 mg/dL (ref 70–99)
Potassium: 3.7 mEq/L (ref 3.5–5.1)
Potassium: 3.8 mEq/L (ref 3.5–5.1)
Potassium: 4.7 mEq/L (ref 3.5–5.1)
Potassium: 5 mEq/L (ref 3.5–5.1)
Potassium: 4 mEq/L (ref 3.5–5.1)
Sodium: 136 mEq/L (ref 135–145)
Sodium: 144 mEq/L (ref 135–145)
Sodium: 136 mEq/L (ref 135–145)
Total Bilirubin: 0.3 mg/dL (ref 0.3–1.2)
Total Bilirubin: 0.3 mg/dL (ref 0.3–1.2)
Total Bilirubin: 0.4 mg/dL (ref 0.3–1.2)
Total Bilirubin: 3.2 mg/dL — ABNORMAL HIGH (ref 0.3–1.2)
Total Bilirubin: 1 mg/dL (ref 0.3–1.2)
Total Bilirubin: 1 mg/dL (ref 0.3–1.2)
Total Bilirubin: 1 mg/dL (ref 0.3–1.2)
Total Protein: 4.8 g/dL — ABNORMAL LOW (ref 6.0–8.3)
Total Protein: 4.9 g/dL — ABNORMAL LOW (ref 6.0–8.3)
Total Protein: 5.5 g/dL — ABNORMAL LOW (ref 6.0–8.3)
Total Protein: 5.8 g/dL — ABNORMAL LOW (ref 6.0–8.3)
Total Protein: 6.6 g/dL (ref 6.0–8.3)

## 2010-06-05 LAB — CARDIAC PANEL(CRET KIN+CKTOT+MB+TROPI)
CK, MB: 1 ng/mL (ref 0.3–4.0)
CK, MB: 1 ng/mL (ref 0.3–4.0)
Relative Index: INVALID (ref 0.0–2.5)
Relative Index: INVALID (ref 0.0–2.5)
Total CK: 13 U/L (ref 7–177)
Troponin I: 0.04 ng/mL (ref 0.00–0.06)

## 2010-06-05 LAB — BASIC METABOLIC PANEL
BUN: 13 mg/dL (ref 6–23)
BUN: 7 mg/dL (ref 6–23)
BUN: 25 mg/dL — ABNORMAL HIGH (ref 6–23)
CO2: 24 mEq/L (ref 19–32)
CO2: 26 mEq/L (ref 19–32)
CO2: 31 mEq/L (ref 19–32)
Calcium: 7.8 mg/dL — ABNORMAL LOW (ref 8.4–10.5)
Calcium: 8.3 mg/dL — ABNORMAL LOW (ref 8.4–10.5)
Calcium: 8.9 mg/dL (ref 8.4–10.5)
Calcium: 8.5 mg/dL (ref 8.4–10.5)
Calcium: 8.6 mg/dL (ref 8.4–10.5)
Chloride: 105 mEq/L (ref 96–112)
Chloride: 106 mEq/L (ref 96–112)
Chloride: 109 mEq/L (ref 96–112)
Chloride: 110 mEq/L (ref 96–112)
Creatinine, Ser: 0.97 mg/dL (ref 0.4–1.2)
Creatinine, Ser: 1.1 mg/dL (ref 0.4–1.2)
Creatinine, Ser: 1.46 mg/dL — ABNORMAL HIGH (ref 0.4–1.2)
GFR calc Af Amer: 59 mL/min — ABNORMAL LOW (ref >60)
GFR calc Af Amer: >60 mL/min (ref >60)
GFR calc Af Amer: >60 mL/min (ref >60)
GFR calc Af Amer: 42 mL/min — ABNORMAL LOW (ref >60)
GFR calc non Af Amer: 44 mL/min — ABNORMAL LOW (ref >60)
GFR calc non Af Amer: 49 mL/min — ABNORMAL LOW (ref >60)
GFR calc non Af Amer: 53 mL/min — ABNORMAL LOW (ref >60)
GFR calc non Af Amer: 35 mL/min — ABNORMAL LOW (ref >60)
GFR calc non Af Amer: 36 mL/min — ABNORMAL LOW (ref >60)
Glucose, Bld: 110 mg/dL — ABNORMAL HIGH (ref 70–99)
Glucose, Bld: 120 mg/dL — ABNORMAL HIGH (ref 70–99)
Potassium: 3.8 mEq/L (ref 3.5–5.1)
Potassium: 4.1 mEq/L (ref 3.5–5.1)
Potassium: 4.1 mEq/L (ref 3.5–5.1)
Potassium: 4.6 mEq/L (ref 3.5–5.1)
Sodium: 137 mEq/L (ref 135–145)
Sodium: 137 mEq/L (ref 135–145)
Sodium: 139 mEq/L (ref 135–145)
Sodium: 139 mEq/L (ref 135–145)
Sodium: 136 mEq/L (ref 135–145)
Sodium: 137 mEq/L (ref 135–145)

## 2010-06-05 LAB — HEMOGLOBIN AND HEMATOCRIT, BLOOD
HCT: 25.7 % — ABNORMAL LOW (ref 36.0–46.0)
HCT: 27.3 % — ABNORMAL LOW (ref 36.0–46.0)
HCT: 30.2 % — ABNORMAL LOW (ref 36.0–46.0)
Hemoglobin: 10.4 g/dL — ABNORMAL LOW (ref 12.0–15.0)
Hemoglobin: 9.5 g/dL — ABNORMAL LOW (ref 12.0–15.0)

## 2010-06-05 LAB — CROSSMATCH: ABO/RH(D): O POS

## 2010-06-05 LAB — DIFFERENTIAL
Basophils Absolute: 0 10*3/uL (ref 0.0–0.1)
Basophils Absolute: 0 10*3/uL (ref 0.0–0.1)
Basophils Absolute: 0 10*3/uL (ref 0.0–0.1)
Basophils Absolute: 0 10*3/uL (ref 0.0–0.1)
Basophils Absolute: 0.1 10*3/uL (ref 0.0–0.1)
Basophils Relative: 0 % (ref 0–1)
Basophils Relative: 1 % (ref 0–1)
Basophils Relative: 1 % (ref 0–1)
Basophils Relative: 1 % (ref 0–1)
Eosinophils Absolute: 0.1 10*3/uL (ref 0.0–0.7)
Eosinophils Absolute: 0.2 10*3/uL (ref 0.0–0.7)
Eosinophils Relative: 0 % (ref 0–5)
Eosinophils Relative: 5 % (ref 0–5)
Eosinophils Relative: 0 % (ref 0–5)
Eosinophils Relative: 0 % (ref 0–5)
Lymphocytes Relative: 11 % — ABNORMAL LOW (ref 12–46)
Lymphocytes Relative: 31 % (ref 12–46)
Lymphocytes Relative: 10 % — ABNORMAL LOW (ref 12–46)
Lymphocytes Relative: 4 % — ABNORMAL LOW (ref 12–46)
Lymphocytes Relative: 5 % — ABNORMAL LOW (ref 12–46)
Lymphocytes Relative: 6 % — ABNORMAL LOW (ref 12–46)
Lymphs Abs: 2.1 10*3/uL (ref 0.7–4.0)
Lymphs Abs: 0.5 10*3/uL — ABNORMAL LOW (ref 0.7–4.0)
Monocytes Absolute: 0.6 10*3/uL (ref 0.1–1.0)
Monocytes Absolute: 0.6 10*3/uL (ref 0.1–1.0)
Monocytes Absolute: 0.9 10*3/uL (ref 0.1–1.0)
Monocytes Absolute: 0.6 10*3/uL (ref 0.1–1.0)
Monocytes Absolute: 0.7 10*3/uL (ref 0.1–1.0)
Monocytes Relative: 12 % (ref 3–12)
Monocytes Relative: 13 % — ABNORMAL HIGH (ref 3–12)
Monocytes Relative: 7 % (ref 3–12)
Monocytes Relative: 6 % (ref 3–12)
Neutro Abs: 2.4 10*3/uL (ref 1.7–7.7)
Neutro Abs: 5.8 10*3/uL (ref 1.7–7.7)
Neutro Abs: 10.7 10*3/uL — ABNORMAL HIGH (ref 1.7–7.7)
Neutro Abs: 13.8 10*3/uL — ABNORMAL HIGH (ref 1.7–7.7)
Neutro Abs: 6.6 10*3/uL (ref 1.7–7.7)
Neutrophils Relative %: 51 % (ref 43–77)
Neutrophils Relative %: 70 % (ref 43–77)
Neutrophils Relative %: 80 % — ABNORMAL HIGH (ref 43–77)

## 2010-06-05 LAB — POCT I-STAT 3, ART BLOOD GAS (G3+)
Patient temperature: 98.6
pCO2 arterial: 45.3 mmHg — ABNORMAL HIGH (ref 35.0–45.0)
pH, Arterial: 7.284 — ABNORMAL LOW (ref 7.350–7.400)

## 2010-06-05 LAB — CULTURE, BLOOD (ROUTINE X 2)
Culture: NO GROWTH
Culture: NO GROWTH

## 2010-06-05 LAB — PHOSPHORUS
Phosphorus: 2.8 mg/dL (ref 2.3–4.6)
Phosphorus: 4.1 mg/dL (ref 2.3–4.6)
Phosphorus: 4.2 mg/dL (ref 2.3–4.6)
Phosphorus: 4.3 mg/dL (ref 2.3–4.6)

## 2010-06-05 LAB — HEPATIC FUNCTION PANEL
ALT: 222 U/L — ABNORMAL HIGH (ref 0–35)
AST: 283 U/L — ABNORMAL HIGH (ref 0–37)
Alkaline Phosphatase: 306 U/L — ABNORMAL HIGH (ref 39–117)
Indirect Bilirubin: 1 mg/dL — ABNORMAL HIGH (ref 0.3–0.9)

## 2010-06-05 LAB — BILIRUBIN, FRACTIONATED(TOT/DIR/INDIR): Total Bilirubin: 1.9 mg/dL — ABNORMAL HIGH (ref 0.3–1.2)

## 2010-06-05 LAB — APTT: aPTT: 27 seconds (ref 24–37)

## 2010-06-05 LAB — MAGNESIUM
Magnesium: 1.5 mg/dL (ref 1.5–2.5)
Magnesium: 1.6 mg/dL (ref 1.5–2.5)
Magnesium: 1.7 mg/dL (ref 1.5–2.5)
Magnesium: 1.8 mg/dL (ref 1.5–2.5)
Magnesium: 2.2 mg/dL (ref 1.5–2.5)
Magnesium: 1.8 mg/dL (ref 1.5–2.5)

## 2010-06-05 LAB — TRIGLYCERIDES: Triglycerides: 91 mg/dL (ref <150)

## 2010-06-05 LAB — URINALYSIS, ROUTINE W REFLEX MICROSCOPIC
Bilirubin Urine: NEGATIVE
Leukocytes, UA: NEGATIVE
Nitrite: NEGATIVE
Specific Gravity, Urine: 1.014 (ref 1.005–1.030)
Urobilinogen, UA: 1 mg/dL (ref 0.0–1.0)
pH: 7.5 (ref 5.0–8.0)

## 2010-06-05 LAB — PROTIME-INR
INR: 1.1 (ref 0.00–1.49)
INR: 1.2 (ref 0.00–1.49)
Prothrombin Time: 14.5 seconds (ref 11.6–15.2)
Prothrombin Time: 15.6 seconds — ABNORMAL HIGH (ref 11.6–15.2)

## 2010-06-05 LAB — POCT I-STAT, CHEM 8
Calcium, Ion: 1.38 mmol/L — ABNORMAL HIGH (ref 1.12–1.32)
Chloride: 107 mEq/L (ref 96–112)
Creatinine, Ser: 1.2 mg/dL (ref 0.4–1.2)
Glucose, Bld: 132 mg/dL — ABNORMAL HIGH (ref 70–99)
HCT: 30 % — ABNORMAL LOW (ref 36.0–46.0)

## 2010-06-05 LAB — PREPARE RBC (CROSSMATCH)

## 2010-06-05 LAB — LIPASE, BLOOD: Lipase: 28 U/L (ref 11–59)

## 2010-06-05 LAB — URINE MICROSCOPIC-ADD ON

## 2010-06-05 LAB — PREALBUMIN: Prealbumin: 9.9 mg/dL — ABNORMAL LOW (ref 18.0–45.0)

## 2010-06-05 LAB — CHOLESTEROL, TOTAL: Cholesterol: 73 mg/dL (ref 0–200)

## 2010-06-05 LAB — AMYLASE: Amylase: 53 U/L (ref 27–131)

## 2010-06-07 LAB — COMPREHENSIVE METABOLIC PANEL
AST: 35 U/L (ref 0–37)
Albumin: 3.7 g/dL (ref 3.5–5.2)
CO2: 30 mEq/L (ref 19–32)
Calcium: 10.3 mg/dL (ref 8.4–10.5)
Creatinine, Ser: 1.77 mg/dL — ABNORMAL HIGH (ref 0.4–1.2)
GFR calc Af Amer: 34 mL/min — ABNORMAL LOW (ref >60)
GFR calc non Af Amer: 28 mL/min — ABNORMAL LOW (ref >60)
Sodium: 140 mEq/L (ref 135–145)
Total Protein: 6.1 g/dL (ref 6.0–8.3)

## 2010-06-07 LAB — URINE CULTURE: Colony Count: >=100000

## 2010-06-07 LAB — URINE MICROSCOPIC-ADD ON

## 2010-06-07 LAB — URINALYSIS, ROUTINE W REFLEX MICROSCOPIC
Glucose, UA: NEGATIVE mg/dL
Hgb urine dipstick: NEGATIVE
Ketones, ur: NEGATIVE mg/dL
Protein, ur: 30 mg/dL — AB
Urobilinogen, UA: 1 mg/dL (ref 0.0–1.0)

## 2010-06-07 LAB — CBC
MCHC: 34.1 g/dL (ref 30.0–36.0)
MCV: 94.3 fL (ref 78.0–100.0)
Platelets: 152 10*3/uL (ref 150–400)
RDW: 15.3 % (ref 11.5–15.5)

## 2010-06-07 LAB — DIFFERENTIAL
Eosinophils Relative: 0 % (ref 0–5)
Lymphocytes Relative: 9 % — ABNORMAL LOW (ref 12–46)
Lymphs Abs: 1.1 10*3/uL (ref 0.7–4.0)
Monocytes Relative: 7 % (ref 3–12)

## 2010-06-14 ENCOUNTER — Other Ambulatory Visit: Payer: Self-pay | Admitting: Oncology

## 2010-06-14 ENCOUNTER — Encounter (HOSPITAL_BASED_OUTPATIENT_CLINIC_OR_DEPARTMENT_OTHER): Payer: Medicare Other | Admitting: Oncology

## 2010-06-14 DIAGNOSIS — E538 Deficiency of other specified B group vitamins: Secondary | ICD-10-CM

## 2010-06-14 DIAGNOSIS — N189 Chronic kidney disease, unspecified: Secondary | ICD-10-CM

## 2010-06-14 DIAGNOSIS — D631 Anemia in chronic kidney disease: Secondary | ICD-10-CM

## 2010-06-14 LAB — CBC WITH DIFFERENTIAL/PLATELET
Eosinophils Absolute: 0.1 10*3/uL (ref 0.0–0.5)
HCT: 31.5 % — ABNORMAL LOW (ref 34.8–46.6)
LYMPH%: 42.9 % (ref 14.0–49.7)
MCHC: 33.9 g/dL (ref 31.5–36.0)
MCV: 94.2 fL (ref 79.5–101.0)
MONO#: 0.5 10*3/uL (ref 0.1–0.9)
NEUT%: 46.8 % (ref 38.4–76.8)
Platelets: 84 10*3/uL — ABNORMAL LOW (ref 145–400)
WBC: 5.4 10*3/uL (ref 3.9–10.3)

## 2010-07-10 NOTE — Consult Note (Signed)
NAME:  [name], [name] NO.:  [id number]   MEDICAL RECORD NO.:  [id number]          PATIENT TYPE:  INP   LOCATION:  5154                         FACILITY:  [facility]   PHYSICIAN:  Pedro Earls, MD     DATE OF BIRTH:  07/08/1932   DATE OF CONSULTATION:  DATE OF DISCHARGE:                                 CONSULTATION   In brief, this is a 77 year old female patient who was admitted on Jul 02, 2008, with a chief complaint of abdominal pain.  The patient was  diagnosed with duodenal diverticulitis for which she was started on  Cipro and Flagyl and [facility] was consulted.  The patient subsequently  had undergone an endoscopy, which confirmed the diagnosis of duodenal  diverticulitis and recommendations were to continue the antibiotic.  On  Jul 07, 2008, the patient started having more abdominal pain with a  glass of orange juice, although denied any nausea or vomiting.  The  patient was seen by [facility] and the CT scan was repeated, which  showed the worsening of the fluid collection around the diverticulitis  and the recommendations were to keep the patient n.p.o. and start on  Invanz and a TPN.   PHYSICAL EXAMINATION:  VITAL SIGNS:  On Jul 09, 2008, temperature 98.4,  pulse 97, respirations 20, blood pressure 177/83, pulse ox 97% on room  air.  CVS:  S1, S2, regular.  CHEST:  Clear.  ABDOMEN:  Soft, nontender.  Bowel sounds present.  No  hepatosplenomegaly.  EXTREMITIES:  Peripheral pulses present.  No clubbing, cyanosis, or  edema.   The patient's medications have been as follows:  Xanax, atenolol,  Invanz, Vytorin, heparin, hydrochlorothiazide, lisinopril, Protonix,  Detrol, Tylenol, Dulcolax, Reglan.   LABORATORY DATA:  Blood cultures have been negative.  No new labs  available.   IMPRESSION:  1. Complicated duodenal diverticulitis:  Continue Invanz and p.o.      status, TPN as per Surgery.  Plan to get a repeat CT scan in 3      days, if improved  antibiotic would be continued, if there is no      improvement or worsening the patient will need a surgical      intervention as per Surgery.  2. Dehydration.  Continue TPN.  3. Anemia.  Keep H and H under control.  We will check the CBC in a.m.  4. Thrombocytopenia, stable.  5. Uncontrolled hypertension.  The patient is on lisinopril at this      point.  I will start the patient on p.r.n. Vasotec IV for systolic      blood pressure greater than 150.      Pedro Earls, MD  Electronically Signed     NS/MEDQ  D:  07/09/2008  T:  07/10/2008  Job:  [id number]

## 2010-07-10 NOTE — Consult Note (Signed)
NAMESATARA, VIRELLA                  ACCOUNT NO.:  [id number]   MEDICAL RECORD NO.:  [id number]          PATIENT TYPE:  INP   LOCATION:  3107                         FACILITY:  [facility]   PHYSICIAN:  Levert Feinstein, MD          DATE OF BIRTH:  1932-05-05   DATE OF CONSULTATION:  DATE OF DISCHARGE:                                 CONSULTATION   CLINICAL HISTORY:  The patient is a 77 year old female with past medical  history of diabetes and hypertension who is admitted since May 8, for  duodenum diverticulitis, repeat imaging has demonstrated worsening  inflammatory changes, was switched from Flagyl and Cipro to current  Invanz, the patient was also n.p.o. on liquid diet only since admission.  She has gradually developed confusion, delirium, and the daughter at the  bedside reported worsening with prolonged hospital staying.   This evening around 7 o'clock, after using bathroom by assistance, she  suddenly became pale, unresponsive, but with normal sinus rhythm, fairly  stable vital signs during that period of time, and recovered in few  minutes, code blue was activated, but the patient regained to the  baseline fairly quickly, without CPR performed.  I got call from  hospitalist Dr. Fayrene Fearing, who has found the patient has dysarthria, left  facial droop, bilateral lower extremity clonus, concerning neurological  condition, to her sudden unresponsive episode.   Daughter also reported the patient had similar confusion episode 2 years  ago during the hospital staying for GI infection.   She was highly functional prior to admission, lived with her son, but  able to keep home checking balance.  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  Never worked outside job.   REVIEW OF SYSTEMS:  The patient was very confused, was not able to woke  up from sleep, mumbling, was not able to provide any meaningful story.   PAST MEDICAL HISTORY:  Hypertension, diabetes, depression, anxiety, and  duodenal  diverticulitis.   PAST SURGICAL HISTORY:  Hysterectomy, cholecystectomy, right shoulder  surgery for rotator cuff repair.   SOCIAL HISTORY:  Denies smoking, drinking, illicit drug use.  Lives with  her son.   FAMILY HISTORY:  Nonsignificant.   CURRENT MEDICATIONS:  Xanax, Propranolol, clonidine, Invanz, Vytorin,  hydrochlorothiazide, insulin, lisinopril, Protonix, Detrol, Tylenol,  Ativan p.r.n., Reglan, Phenergan p.r.n.   ALLERGIES:  No known drug allergy.   PHYSICAL EXAMINATION:  VITAL SIGNS:  Temperature 98.3, heart rate of 77,  blood pressure 122/70, saturation 98%.  CARDIAC:  Regular rate and rhythm.  PULMONARY:  Clear to auscultation bilaterally.  NECK:  No carotid bruits and the patient has mild rigidity.  NEUROLOGIC:  She woke up from sleep, with name calling, and little bit  agitated, mumbling, but not following commands, and not able to provide  any meaningful history.  Cranial nerves II through XII.  Pupil equal,  round, reactive to light.  Conjugate eye movement.  She was edentulous,  but facial was symmetric.  Sensory was normal.  Uvula and tongue  midline.  Head turning, shoulder shrugging was normal and symmetric.  Motor examination, she was able to move all 4 extremities without  difficulty, withdraws to pain.  Deep tendon reflex present, symmetric.  Plantar responses were flexor.   CT of the brain without contrast; atrophy, periventricular white matter  disease, no acute change.   LABORATORY EVALUATION:  Cardiac panel negative.  Creatinine 1.24.  Anemia, hemoglobin of 9.5.   ASSESSMENT:  An 77 year old female with transient unresponsive in the  setting of prolonged hospital stay, delirium.  Differentiation diagnosis  including worsening encephalopathy versus seizure, no focal signs at  current state to suggest a stroke.   PLAN:  1. MRI of the brain without contrast.  2. PRN Haldol, very low dose, for agitation.  3. EEG.  4. Try to simplify her medication  list.  There was 5% of chance for      Invanz to cause delirium, and Phenergan, Detrol, Ativan, Reglan may      also worsen encephalopathy.  5. I do not think that she needs antiepileptic medications.      Levert Feinstein, MD  Electronically Signed     YY/MEDQ  D:  07/14/2008  T:  07/15/2008  Job:  [id number]

## 2010-07-10 NOTE — Discharge Summary (Signed)
[name], [name]                  ACCOUNT NO.:  [id number]   MEDICAL RECORD NO.:  [id number]          PATIENT TYPE:  INP   LOCATION:  4711                         FACILITY:  [facility]   PHYSICIAN:  Hind I Elsaid, MD      DATE OF BIRTH:  November 03, 1932   DATE OF ADMISSION:  07/02/2008  DATE OF DISCHARGE:  07/24/2008                               DISCHARGE SUMMARY   PRIMARY CARE PHYSICIAN:  Production assistant, radio.   GASTROENTEROLOGY:  [facility].   DISCHARGE DIAGNOSES:  1. Duodenal diverticulum bleeding.  2. Anemia of acute blood loss secondary to duodenal diverticulum      bleeding.  3. Duodenal diverticulitis, resolved.  4. Elevated liver function tests, thought to be secondary to biliary      stricture.  5. Biliary stricture diagnosed on MRCP.  6. Hypertension.  7. Hyperlipidemia.  8. Diabetes mellitus.  9. Altered mental status, resolved.  10.Episode of mild chest pain which resolved.  The patient declined      any cardiac evaluation at this time and admitted would like to      follow up with [facility] as an outpatient.   Covering the period from Jul 20, 2008 until date of discharge.   PROCEDURES:  1. Abdominal x-ray, normal bowel and bladder.  2. Celiac and small mesenteric artery angiogram.  3. Endoscopy which did show blood in the second portion of the      duodenum coming from the periampullary diverticulum.  Clot is      snared and __________ but could not get adequate visualization even      with jejunoscopy.  4. Celiac and small superior mesenteric artery angiogram which did not      show any evidence of active bleeding.   CONSULTATIONS:  1. Gastroenterology, continue to follow up surgery.  2. Interventional radiology.  3. Critical care.   HOSPITAL COURSE:  1. Please review the hospital course before Jul 20, 2008, by a      dictation done by Dr. Berkley Harvey.  On May 26 the patient looked very      lethargic and pale, became hypotensive with  blood pressure 94/41,      hemoglobin dropped to 7 and evidence of occult blood was positive.      Hypotension thought to be secondary to possibility of diverticular      bleeding.  The patient was transferred to ICU, received a total of      4 units of blood transfusion.  Gastroenterology informed about the      patient's situation.  Dr. Leone Payor immediately evaluated the      patient.  I  recommended EGD at bedside which did show possibility      of diverticulum bleeding.  Surgery and critical care and      interventional radiology consulted where the patient underwent      angiogram.  Angiogram was negative for active bleeding.  The      patient kept under close observation by critical care and      apparently bleeding stopped.  The patient remained  stable.      Apparently, the patient while in the ICU developed short period of      chest pain, which resolved immediately.  Cardiac enzymes were      negative.  EKG did show some flipped T-wave, but the patient's      symptoms completely resolved.  Cannot do a 2-D echo as weekend but      the patient informed about her chest pain, may need to be evaluated      by cardiology and the patient was advised to follow with [facility]      Cardiology and phone number was given.  The patient declined any      cardiac consult at this time.  Surgery did not recommend any      surgical intervention at this time as the bleeding stopped and as      we mentioned the patient was placed on Protonix IV.  Bleeding      apparently stopped and the patient's symptoms completely resolved.  2. Diverticulitis.  The patient completed course of antibiotics.  3. Acute renal failure, resolved, which happened when the patient had      bleeding which is completely resolved after that.  4. Mild elevated LFTs, thought to be secondary to compression by      periampullary duodenal  diverticulum.  5. Hypertension.  The patient will be discharged with atenolol,      clonidine  and HCTZ in addition to lisinopril.  The patient need to      follow up closely with her primary care physician.  6. Diabetes mellitus.  The patient has episode of hypoglycemia      secondary to n.p.o.  Avandia was stopped and the patient will be      discharged on a small dose of glipizide.   DISCHARGE MEDICATIONS:  1. Protonix 40 mg twice daily.  2. Atenolol 25 mg daily.  3. Clonidine 0.2 mg twice daily.  4. Lisinopril/HCTZ 20/25 once daily.  5. Glipizide 2.5 mg daily.  6. Vytorin 10/80 one tab daily.  7. Aspirin is not prescribed secondary to the recent life-threatening      bleeding.   FOLLOWUP:  The patient need to follow up next week with her primary care  physician, also need to make an appointment to see [facility]  for risk stratification and possibility of 2-D echo/cardiac cath if  seemed necessary by them.  At this time, the patient denies any nausea,  vomiting.  Denies any chest pain and feeling good, and the patient is  eager to go home.   Time spent 35 minutes for discharge.      Hind Bosie Helper, MD  Electronically Signed     HIE/MEDQ  D:  07/24/2008  T:  07/25/2008  Job:  [id number]

## 2010-07-10 NOTE — Discharge Summary (Signed)
NAMEZADAYA, CUADRA                  ACCOUNT NO.:  [id number]   MEDICAL RECORD NO.:  [id number]          PATIENT TYPE:  INP   LOCATION:  4706                         FACILITY[facility]  [facility]   PHYSICIAN:  Charlestine Massed, MDDATE OF BIRTH:  [birth year]   DATE OF ADMISSION:  07/02/2008  DATE OF DISCHARGE:                               DISCHARGE SUMMARY   PRIMARY CARE PHYSICIAN:  [facility], Dr. Benedetto Goad or  Dr. Doristine Counter.   GASTROENTEROLOGY:  [facility], followed by Dr. Leone Payor.   SURGERY:  [facility], being followed by Dr.Brodie.   NEUROLOGY:  Levert Feinstein, MD.   REASON FOR ADMISSION:  Abdominal pain.   DISCHARGE DIAGNOSIS:  1. Duodenal diverticulitis, currently resolving.  2. Biliary stricture, diagnosed on MRCP.  3. Hypertension.  4. Dyslipidemia.  5. Diabetes mellitus.  6. Episode of sudden loss or unresponsiveness, possibly due to      seizures secondary to ertapenem.  Currently no issues.  7. Chronic kidney disease stage III, stable.   MEDICATIONS:  Complete list of medications will be dictated at the time  of discharge.   HOSPITAL COURSE:  1. Abdominal pain due to diverticulitis.  The patient was admitted on      Jul 02, 2008 for abdominal pain.  The patient was evaluated      initially.  CT of the abdomen shows duodenal diverticula with      superimposed inflammatory process with possible abscess.  Surgery      was called in for consult.  The patient was started on initially      Cipro and Flagyl, but Surgery changed the antibiotics to      Invanz/ertapenem.  The patient improved gradually on that.  She was      kept n.p.o. and was started on TNA nutrition via PICC line in the      right upper extremity.  The patient improved gradually on that.      She was started on clear liquid diet, continued on the Invanz, and      after that she was slowly started on bland soft diet along with      concomitant TNA.   On Jul 13, 2008, the  patient suddenly had an episode of loss of  consciousness for a few minutes and became hypoxic.  Rapid Response was  called and the patient had a CAT scan, which revealed no issues.  Was  transferred to the ICU and was observed for 1 day.  The patient  recovered consciousness soon within a few minutes into the Rapid  Response.  There were no residual effects noted.  The patient was on  Invanz for quite a few days and she has chronic kidney disease, so it  was concluded that the patient possibly had mild confusion as well as an  episode of seizure secondary to Invanz.  The patient had a few episodes  of confusion before and after that.  A Neurology consult was called in  view of that and they evaluated and they said there is no further  neurology workup needed other  than EEG.  The patient had an EEG as well  as an MRA done.  The MRI did not reveal any infarct or any new issues.  EEG did not reveal any epileptiform foci, so there is no need for any  chronic antiepileptic medication needed.   Currently, the diverticulitis has considerably resolved.  The patient is  on day 11 of antibiotics.  After the episode of possible seizure, the  Pincus Sanes was discontinued and started on Zosyn.  Together with Pincus Sanes and  Zosyn, the patient has completed 11 days.  May need 2 or 3 more days of  IV antibiotics as per Surgery, but we will let Surgery decide about it.  The question of whether the patient can be switched to p.o. Levaquin or  Flagyl was considered and it was discussed with Surgery who suggested  that with the recent trend in microorganisms, the coverage is not  effective with Levaquin and Flagyl and so to continue on Zosyn.  He is  currently on IV antibiotics and requests hospital stay.  1. Obstructive jaundice.  The patient had minimal biliary obstruction.      Gastroenterology was involved in the management of the case in the      beginning and so was reconsulted when the bilirubin started  rising      again to 3.6 with an obvious icterus in the eye.  MRCP was done      today which reveals a biliary stricture as well as a small mass in      the pancreas, in which the chances of the mass being a cancer is      very less likely as per MRI.  They have considered that duodenal      stricture is totally separate from this issue.  The      Gastroenterology needs to follow up with the patient further for      possible stent placement, but in view of the acute diverticulitis      and the duodenum being in place, the question of whether the      patient will be able to tolerate an ERCP with the endoscope      reaching up to the ampulla of Vater, is questionable.  As this      being not an urgent issue and the biliary stricture is a chronic      issue, the management can be considered as outpatient with      outpatient EGD is also considerable at this time.  I would let      Gastroenterology make further decisions on this issue.  2. Chronic kidney disease.  Currently, the GFR is stable.  There is no      further deterioration seen with the GFR.  3. Hypertension.  Currently, blood pressures are controlled.  Continue      esmolol and lisinopril for that.  4. Neurologically, currently no further seizure episodes as mentioned      in the discussion of No. 1.  No further management needed.  Hence,      Neurology has signed off.  MRI and EEG were negative.  5. Nausea and vomiting.  The patient had slight nausea yesterday      associated with the increased bilirubin level.  Currently, her      nausea has decreased.  [name] on Reglan p.r.n. for that.   DISPOSITION:  Disposition is still pending further resolution of the  further decision making by Surgery as well as  Gastroenterology.   Full list of medications will be dictated at the time of discharge by  the discharging physician.   A total of 40 minutes spent today discussing with the patient,  evaluation, and dictation.       Charlestine Massed, MD  Electronically Signed     UT/MEDQ  D:  07/19/2008  T:  07/20/2008  Job:  [id number]   cc:   Gloriajean Dell. Andrey Campanile, M.D.  Marjory Lies, M.D.  Iva Boop, MD,FACG  Levert Feinstein, MD

## 2010-07-10 NOTE — Procedures (Signed)
REFERRING PHYSICIAN:  Dr. Heide Guile, MD   CLINICAL HISTORY:  This 77 year old lady admitted with diverticulitis,  who became unresponsive and coded.  The patient is now confused.   This is a portable EEG recorded with the patient awake and confused.  He  is in standard 10/20 electrode placement on a 17-channel machine.   BACKGROUND:  Awake rhythm consists of 9 Hz alpha, which is of excellent  amplitude, synchronous to eye opening and closure.  No paroxysmal  epileptiform activity, spikes, or sharp waves are noted.  The patient  does get drowsy at times, but no definite sleep stages are seen.  Length  of the recording is 20.5 minutes.  Technical component is average.  EKG  tracing reveals regular sinus rhythm.  Hyperventilation is not  performed.  Photic stimulation is unremarkable.   IMPRESSION:  This EEG performed during awake and drowsy states within  normal limits.  No definite epileptiform features were noted.           ______________________________  Sunny Schlein. Pearlean Brownie, MD     ZOX:WRUE  D:  07/15/2008 16:12:08  T:  07/16/2008 08:07:15  Job #:  [id number]   cc:   Heide Guile, MD  Fax: [phone]

## 2010-07-10 NOTE — Consult Note (Signed)
NAMELAUREN, Adkins NO.:  [id number]   MEDICAL RECORD NO.:  [id number]          PATIENT TYPE:  INP   LOCATION:  5154                         FACILITY:  [facility]   PHYSICIAN:  Adolph Pollack, M.D.DATE OF BIRTH:  11-19-1932   DATE OF CONSULTATION:  DATE OF DISCHARGE:                                 CONSULTATION   PHYSICIAN REQUESTING CONSULT:  John N. Marina Goodell, MD   REASON:  Duodenal diverticulitis.   HISTORY:  [name] is a 77 year old female who has a known duodenal  diverticulum.  She presented on Jul 02, 2008, complaining of progressive  increasing abdominal pain and nausea and vomiting.  She was evaluated  and underwent a CT scan that has demonstrated findings consistent with  the duodenum diverticulitis.  She subsequently was admitted to the  hospital and placed on Cipro and Flagyl.  A GI consultation was  obtained.  She then underwent an endoscopy on Jul 05, 2008, which  demonstrated inflammation edema around the area of the duodenal  diverticula consistent with duodenal diverticulitis.  She had a repeat  CT today, which showed increasing inflammation but no extravasation of  contrast and no obstruction.  I was asked to see her because of this.   PAST MEDICAL HISTORY:  1. Type 2 diabetes mellitus.  2. Duodenal diverticulum.  3. Hypertension.  4. Degenerative joint disease.   PREVIOUS OPERATIONS:  1. Laparoscopic cholecystectomy.  2. Abdominal wall hernia repair.  3. Hysterectomy.  4. Right rotator cuff repair.   ALLERGIES:  None known.   CURRENT MEDICATIONS:  Include subcu heparin, atenolol, Detrol,  lisinopril, hydrochlorothiazide, Avandia, simvastatin, Xanax, sliding  scale insulin, Protonix, IV Cipro, IV Flagyl, as well as p.r.n.  medicines.   SOCIAL HISTORY:  No current tobacco or alcohol use.   REVIEW OF SYSTEMS:  GENERAL:  She states today, she is feeling good not  much pain.  Yesterday, she was having discomfort.  GI:  She says she is  having some loose bowel movements.  No abdominal pain.  No nausea today.   PHYSICAL EXAMINATION:  GENERAL:  An elderly female in no acute distress,  pleasant, and cooperative.  [name] afebrile with normal blood pressure  and heart rate.  RESPIRATORY:  Breath sounds equal and clear.  Respirations unlabored.  CARDIOVASCULAR:  Regular rate and rhythm.  No murmur.  No JVD.  ABDOMEN:  Soft.  There is a supraumbilical scar, little fullness  present.  There is small upper abdominal scars present.  There is no  tenderness present and no palpable masses.  Bowel sounds are present.  MUSCULOSKELETAL:  No edema.   LABORATORY DATA:  Jul 06, 2008:  White blood cell count 8300, hemoglobin  9.4, albumin 2.7.   Upper endoscopy pictures reviewed.  CT was reviewed and discussed with  Dr. Lowella Dandy.  This demonstrates inflammation around the area of the  duodenal diverticulum with air likely in the diverticulum.  No extra  luminal contrast stress present.  No evidence of obstruction.  This is  in the second portion of the duodenum.  There appears to  be a small  recurrent abdominal wall hernia with little bit of fat in the  supraumbilical region.   IMPRESSION:  Duodenal diverticulitis not responding to Cipro and Flagyl.  I am not sure these will cover upper gastrointestinal tract organisms  very well.  [name] nontoxic and currently is asymptomatic.  However, on  CT scan, she has progression of the inflammatory process.   PLAN:  We will change her IV antibiotics to Invanz for broader coverage.  Bowel rest except for medicines and sip of water.  We will start a PICC  line and T&A in her.  We will repeat the CT in 4 days to see if she is  getting any response with a broaden antibiotics and the bowel rest.  If  she is not likely, she would need a laparotomy, drainage of the  infection as well as duodenal diverticulectomy, and the duodenal  exclusion procedure.  This is a high risk operation as the chance of  the  closure of the duodenum leaking is quite high, and I have explained this  to her and her family.  They seem to understand the plan and agreed.      Adolph Pollack, M.D.  Electronically Signed     TJR/MEDQ  D:  07/08/2008  T:  07/09/2008  Job:  [id number]   cc:   [name]. Marina Goodell, MD  [facility]

## 2010-07-10 NOTE — H&P (Signed)
[name], [name]                  ACCOUNT NO.:  [id number]   MEDICAL RECORD NO.:  [id number]          PATIENT TYPE:  INP   LOCATION:  5151                         FACILITY:  [facility]   PHYSICIAN:  Monte Fantasia, MD  DATE OF BIRTH:  Aug 24, 1932   DATE OF ADMISSION:  07/02/2008  DATE OF DISCHARGE:                              HISTORY & PHYSICAL   PRIMARY CARE PHYSICIAN:  In [facility].   CHIEF COMPLAINT:  The patient has come in with the complaint of  abdominal pain.   HISTORY OF PRESENT ILLNESS:  [name] has come  in with the complaints of abdominal pain.  States that the pain is 7-  8/10 in intensity.  The patient states that she had this pain increasing  gradually over the period of day to day and worsening pain was located  in the epigastric to umbilical region and severe stabbing type of pain,  8/10 in intensity, nonradiating, no relieving factors, aggravated by  eating.  The patient denied of any similar episodes in the past.  No  history of diaphoresis, chest pain, shortness of breath, fever, chills,  diarrhea, or constipation.  The patient does complain of nausea and had  multiple episodes of vomiting yesterday.  They were nonbilious in  nature, did not contain any blood, but does not remember exactly what  she was vomiting.   REVIEW OF SYSTEMS:  Review of systems 12-point have been done, negative  for other than those mentioned in HPI.  History obtained from the  patient herself.   ALLERGIES:  No known drug allergies.   HOME MEDICATIONS:  1. Alprazolam 1.5 mg p.o. nightly.  2. Atenolol 50 mg p.o. daily.  3. Detrol 4 mg once daily.  4. Lisinopril/hydrochlorothiazide 20/25 one tablet once daily.  5. Avandia 4 mg once daily.  6. Vytorin 10/80 one tablet once daily.   SURGICAL HISTORY:  Hysterectomy in 1973.  She had a cholecystectomy in  2000.  Right shoulder surgery for rotator cuff repair.   PAST MEDICAL HISTORY:   Hypertension, diabetes, dyslipidemia, and  degenerative disease of the lumbar spine.   SOCIAL HISTORY:  The patient denies of smoking alcohol or IV drug use.   FAMILY HISTORY:  Not significant secondary to the patient's age.   PHYSICAL EXAMINATION:  VITAL SIGNS:  Today, temperature of 99.4, heart  rate of 97, blood pressure of 187/87, respirations 16.  GENERAL:  Well nourished, averagely built, comfortable, lying in bed.  HEENT:  Neck is supple.  Pupils equal, reacting to light.  No pallor.  No lymphadenopathy.  RESPIRATORY:  Air entry is bilaterally equal.  No rales, no rhonchi.  CARDIOVASCULAR:  S1 and S2.  Regular rate and rhythm.  ABDOMEN:  Soft.  Minimal tenderness in the umbilical region.  No  guarding, no rigidity, no organomegaly.  Bowel sounds plus.  EXTREMITIES:  No edema of feet.  CNS:  The patient is alert, awake, oriented x3.  Cranial nerves II  through XII intact.  No focal neurological deficits.  SKIN:  Intact.  No evidence of any  skin breakdown.   RADIOLOGIC INVESTIGATIONS AT THE TIME OF ADMISSION:  1. CT of the abdomen with contrast:  Impression, duodenal diverticula      with superimposed inflammatory process secondary to duodenal      diverticulitis, abscess, or peptic ulcer disease, stable biliary      dilatation, status post cholecystectomy, stable bilateral renal      cysts, right renal calculus, stable atherosclerotic disease      involving the aorta.  2. CT of the pelvis:  Impression, small abdominal wall hernia,      diverticulosis of the sigmoid colon, no pelvic mass or adenopathy.   LABORATORY DATA DONE ON ADMISSION:  Total WBC 11.9, hemoglobin 11.8,  hematocrit 35.1, platelets 99.  Sodium 138, potassium 4.6, chloride 106,  bicarb 26, glucose 165, BUN 22, creatinine 1.28, alkaline phosphatase  49, AST 108, ALT 71, total bilirubin 1.0, albumin 3.8, calcium of 9.5,  lipase 63.  UA is negative.   ASSESSMENT:  1. Duodenal diverticulitis.  2.  Hypertension.  3. Dyslipidemia.  4. Diabetes.   PLAN:  Admit the patient to [facility].  The patient has  been started on ciprofloxacin and Flagyl IV in view of her  diverticulitis.  If the patient has temperature to do blood cultures x2.  UA has been negative.  The patient could be transitioned to p.o.  antibiotics in the next 24-48 hours and we would give her gentle IV  fluid hydration with 80 mL per hour for next 24 hours in view of her  mild dehydration.  We would repeat her CBC and BMET in a.m.   In view of her hypertension, we would continue on her atenolol and  lisinopril 20 mg and Vytorin.  In view of diabetes, we would continue  with her Avandia.  We would give 2 g sodium 1800 ADA diet.   DVT and GI prophylaxis, on heparin and Protonix.   TOTAL TIME FOR ADMISSION:  50 minutes.      Monte Fantasia, MD  Electronically Signed     MP/MEDQ  D:  07/02/2008  T:  07/03/2008  Job:  [id number]

## 2010-07-11 ENCOUNTER — Other Ambulatory Visit: Payer: Self-pay | Admitting: Oncology

## 2010-07-11 ENCOUNTER — Encounter (HOSPITAL_BASED_OUTPATIENT_CLINIC_OR_DEPARTMENT_OTHER): Payer: Medicare Other | Admitting: Oncology

## 2010-07-11 DIAGNOSIS — D631 Anemia in chronic kidney disease: Secondary | ICD-10-CM

## 2010-07-11 DIAGNOSIS — E538 Deficiency of other specified B group vitamins: Secondary | ICD-10-CM

## 2010-07-11 DIAGNOSIS — N189 Chronic kidney disease, unspecified: Secondary | ICD-10-CM

## 2010-07-11 LAB — CBC WITH DIFFERENTIAL/PLATELET
BASO%: 0.4 % (ref 0.0–2.0)
EOS%: 0.8 % (ref 0.0–7.0)
HCT: 36.4 % (ref 34.8–46.6)
LYMPH%: 34 % (ref 14.0–49.7)
MCH: 31.4 pg (ref 25.1–34.0)
MCHC: 33 g/dL (ref 31.5–36.0)
MONO%: 11.3 % (ref 0.0–14.0)
NEUT%: 53.5 % (ref 38.4–76.8)
Platelets: 103 10*3/uL — ABNORMAL LOW (ref 145–400)

## 2010-07-13 NOTE — Op Note (Signed)
[name], [name]                  ACCOUNT NO.:  [id number]   MEDICAL RECORD NO.:  [id number]          PATIENT TYPE:  INP   LOCATION:  5033                         FACILITY:  [facility]   PHYSICIAN:  Lenard Galloway. Mortenson, M.D.DATE OF BIRTH:  [birth year]   DATE OF PROCEDURE:  09/17/2005  DATE OF DISCHARGE:                                 OPERATIVE REPORT   PREOPERATIVE DIAGNOSIS:  End-stage osteoarthritis, right hip.   POSTOPERATIVE DIAGNOSIS:  End-stage osteoarthritis, right hip.   OPERATION:  Total hip replacement on the right using Dupuy Pinnacle Marathon  acetabular alignment with 52-mm outside diameter, 32-mm inside diameter with  an apex hole eliminator.  A 100-series acetabular cup, outside diameter 52  mm and a small stature AML 13.5-mm femoral component with a 32-mm head and a  +5 neck femoral head ball.   SURGEON:  Dr. Rinaldo Ratel.   ASSISTANT:  Dr. Cleophas Dunker and Dr. Alisa Graff.   ANESTHESIA:  General.   PROCEDURE:  The patient was placed on the operating room table in the supine  position.  After satisfactory general anesthesia, the patient was placed in  the full lateral position with the right hip up.  The right lower extremity  and hip were prepped with DuraPrep and draped in the usual sterile fashion.  A Biodrape was placed over the operative site.  A posterior Southern  incision was made.  Self-retaining retractor was  put into the wound.  Bleeds were coagulated.  Tensor fascia lata was then spilt and an east/west  retractor was placed in the wound.  The piriformis was then identified,  tagged and released and was swept to the midline.  The posterior capsule was  cut at the neck and a vertical incision made through the capsule.  This gave  excellent access to the femoral head and the femoral head was then  dislocated.  Using a power saw, a cut was made in the neck at the  appropriate length and angle and the head and part of the neck were removed.  This gave excellent  access to the acetabulum and proximal femur.   A hole starter was placed in the piriformis fossa and a hole was made.  A  canal finder was then passed down the femoral canal and it slid down very  easily.  A series of fully fluted reamers was then used and this was reamed  up to a 13-millimeter diameter and there was excellent cortical cancellous  bone friction.  A 12-millimeter small stature broach was then passed down  the femoral canal and then the 13-millimeter broach was then passed down the  femoral canal.  This seated very nicely on the calcar.  The calcar reamer  was then used to smooth out the calcar.  The broach was then removed.   Attention was then turned to the acetabulum.  Retraction placed about the  acetabulum and the __________ was excised with a knife.  A series of cheese  cutter reamer was then used and this was reamed out to a 51 mm diameter.  There is excellent bleeding in bone  and good medialization of the hip.  The  50-mm trowel was then placed down in the prepared acetabulum and this was  very snug.  This was removed.  Throughout the procedure, saline was used to  irrigate the wound.  The final Pinnacle 100-series cup was then inserted and  an excellent scratch fit was achieved.  This seated very nicely in the  bottom.  Trial of Pinnacle Marathon acetabulum liner +4 was placed in the  cup and screwed in place.  The broach was then passed down the femoral canal  and a series of different lengths were used.  The most stabilization was a  +5 32-mm head on the broach and this went through full range of motion.  Leg  lengths were essentially equal.  All the trial components were then removed.  Apex hole eliminator was then placed in the bottom of the acetabulum.  The  Pinnacle Marathon acetabulum liner +4 10-degree angle with a 32-mm inside  diameter was then snap-fitted in place and locked in place.  The ML small  stature 13.5-mm size and driven down in the femoral  canal and was seated  very nicely in the calcar.  Trials of the head and the neck were revisited  and the +5 neck seemed to be the most stable and again ended up with leg  lengths essentially equal.  The final component was then placed over the  __________ femoral component and tapped in place.  There was good cold weld.  The hip was then  relocated again and put through a full range of motion.  The hip was very stable.  Post hip capsule was then closed with heavy  Tycron.  The piriformis is reattached in the standard manner.  Tensor fascia  lata was closed with interrupted Tycron mattress sutures.  Vicryl was used  to close the subcutaneous tissues.  Stainless steel staples were used to  close the skin.  A sterile dressing was applied.  The patient returned to  the recovery room in excellent condition.  This procedure went extremely  well.   DRAINS:  None.   COMPLICATIONS:  None.  I was very satisfied with the surgical outcome.           ______________________________  Lenard Galloway. Chaney Malling, M.D.     RAM/MEDQ  D:  09/17/2005  T:  09/18/2005  Job:  [id number]

## 2010-08-08 ENCOUNTER — Encounter (HOSPITAL_BASED_OUTPATIENT_CLINIC_OR_DEPARTMENT_OTHER): Payer: Medicare Other | Admitting: Oncology

## 2010-08-08 ENCOUNTER — Other Ambulatory Visit: Payer: Self-pay | Admitting: Oncology

## 2010-08-08 DIAGNOSIS — N189 Chronic kidney disease, unspecified: Secondary | ICD-10-CM

## 2010-08-08 DIAGNOSIS — D631 Anemia in chronic kidney disease: Secondary | ICD-10-CM

## 2010-08-08 DIAGNOSIS — E538 Deficiency of other specified B group vitamins: Secondary | ICD-10-CM

## 2010-08-08 LAB — CBC WITH DIFFERENTIAL/PLATELET
BASO%: 0.4 % (ref 0.0–2.0)
EOS%: 1.8 % (ref 0.0–7.0)
MCH: 31.4 pg (ref 25.1–34.0)
MCHC: 33.4 g/dL (ref 31.5–36.0)
NEUT%: 45.1 % (ref 38.4–76.8)
RDW: 14.3 % (ref 11.2–14.5)
lymph#: 2.4 10*3/uL (ref 0.9–3.3)

## 2010-09-05 ENCOUNTER — Encounter (HOSPITAL_BASED_OUTPATIENT_CLINIC_OR_DEPARTMENT_OTHER): Payer: Medicare Other | Admitting: Oncology

## 2010-09-05 ENCOUNTER — Other Ambulatory Visit: Payer: Self-pay | Admitting: Oncology

## 2010-09-05 DIAGNOSIS — D631 Anemia in chronic kidney disease: Secondary | ICD-10-CM

## 2010-09-05 DIAGNOSIS — N189 Chronic kidney disease, unspecified: Secondary | ICD-10-CM

## 2010-09-05 DIAGNOSIS — I129 Hypertensive chronic kidney disease with stage 1 through stage 4 chronic kidney disease, or unspecified chronic kidney disease: Secondary | ICD-10-CM

## 2010-09-05 DIAGNOSIS — E538 Deficiency of other specified B group vitamins: Secondary | ICD-10-CM

## 2010-09-05 LAB — CBC WITH DIFFERENTIAL/PLATELET
Basophils Absolute: 0 10*3/uL (ref 0.0–0.1)
EOS%: 1.3 % (ref 0.0–7.0)
HGB: 9 g/dL — ABNORMAL LOW (ref 11.6–15.9)
MCH: 32 pg (ref 25.1–34.0)
NEUT#: 2.8 10*3/uL (ref 1.5–6.5)
RDW: 14.9 % — ABNORMAL HIGH (ref 11.2–14.5)
lymph#: 1.9 10*3/uL (ref 0.9–3.3)

## 2010-09-09 LAB — COMPREHENSIVE METABOLIC PANEL
ALT: 8 U/L (ref 0–35)
AST: 13 U/L (ref 0–37)
Albumin: 3.6 g/dL (ref 3.5–5.2)
BUN: 46 mg/dL — ABNORMAL HIGH (ref 6–23)
Calcium: 9 mg/dL (ref 8.4–10.5)
Chloride: 103 mEq/L (ref 96–112)
Potassium: 4.7 mEq/L (ref 3.5–5.3)
Sodium: 140 mEq/L (ref 135–145)
Total Protein: 5.3 g/dL — ABNORMAL LOW (ref 6.0–8.3)

## 2010-09-09 LAB — METHYLMALONIC ACID, SERUM: Methylmalonic Acid, Quantitative: 719 nmol/L — ABNORMAL HIGH (ref 87–318)

## 2010-09-09 LAB — VITAMIN B12: Vitamin B-12: 487 pg/mL (ref 211–911)

## 2010-10-03 ENCOUNTER — Other Ambulatory Visit: Payer: Self-pay | Admitting: Oncology

## 2010-10-03 ENCOUNTER — Encounter (HOSPITAL_BASED_OUTPATIENT_CLINIC_OR_DEPARTMENT_OTHER): Payer: Medicare Other | Admitting: Oncology

## 2010-10-03 DIAGNOSIS — N189 Chronic kidney disease, unspecified: Secondary | ICD-10-CM

## 2010-10-03 DIAGNOSIS — E538 Deficiency of other specified B group vitamins: Secondary | ICD-10-CM

## 2010-10-03 DIAGNOSIS — N039 Chronic nephritic syndrome with unspecified morphologic changes: Secondary | ICD-10-CM

## 2010-10-03 LAB — CBC WITH DIFFERENTIAL/PLATELET
Basophils Absolute: 0 10*3/uL (ref 0.0–0.1)
EOS%: 0.4 % (ref 0.0–7.0)
Eosinophils Absolute: 0 10*3/uL (ref 0.0–0.5)
HCT: 37.3 % (ref 34.8–46.6)
HGB: 11.8 g/dL (ref 11.6–15.9)
MONO#: 0.3 10*3/uL (ref 0.1–0.9)
NEUT#: 5.6 10*3/uL (ref 1.5–6.5)
NEUT%: 75.8 % (ref 38.4–76.8)
RDW: 14.3 % (ref 11.2–14.5)
lymph#: 1.4 10*3/uL (ref 0.9–3.3)

## 2010-10-09 ENCOUNTER — Other Ambulatory Visit: Payer: Self-pay | Admitting: Nephrology

## 2010-10-09 DIAGNOSIS — N185 Chronic kidney disease, stage 5: Secondary | ICD-10-CM

## 2010-10-09 DIAGNOSIS — R748 Abnormal levels of other serum enzymes: Secondary | ICD-10-CM

## 2010-10-12 ENCOUNTER — Ambulatory Visit
Admission: RE | Admit: 2010-10-12 | Discharge: 2010-10-12 | Disposition: A | Discharge status: Home / Self Care | Payer: Medicare Other | Source: Ambulatory Visit | Attending: Nephrology | Admitting: Nephrology

## 2010-10-12 DIAGNOSIS — N185 Chronic kidney disease, stage 5: Secondary | ICD-10-CM

## 2010-10-12 DIAGNOSIS — R748 Abnormal levels of other serum enzymes: Secondary | ICD-10-CM

## 2010-10-22 ENCOUNTER — Ambulatory Visit (INDEPENDENT_AMBULATORY_CARE_PROVIDER_SITE_OTHER): Payer: Medicare Other | Admitting: Surgery

## 2010-10-22 ENCOUNTER — Ambulatory Visit (INDEPENDENT_AMBULATORY_CARE_PROVIDER_SITE_OTHER): Payer: Medicare Other | Admitting: *Deleted

## 2010-10-22 ENCOUNTER — Encounter: Payer: Self-pay | Admitting: Surgery

## 2010-10-22 VITALS — BP 171/74 | HR 43 | Temp 97.5°F | Ht 64.0 in | Wt 136.0 lb

## 2010-10-22 DIAGNOSIS — N186 End stage renal disease: Secondary | ICD-10-CM

## 2010-10-22 DIAGNOSIS — N189 Chronic kidney disease, unspecified: Secondary | ICD-10-CM

## 2010-10-22 DIAGNOSIS — Z01810 Encounter for preprocedural cardiovascular examination: Secondary | ICD-10-CM

## 2010-10-22 NOTE — Progress Notes (Signed)
Subjective:     Patient ID: [name], female   DOB: Jun 26, 1932, 77 y.o.   MRN: [id number]  HPI This is a 77 year old female that I am seeing at the request of Dr. Eliott Nine for dialysis access. The patient suffers from renal insufficiency secondary to diabetes and hypertension. She has not yet on dialysis. [name] right-handed. [name] also medically managed for her hypercholesterolemia. She has had at least 5 episodes of acute renal injury over the past 10 years. These are thought to be secondary to dehydration. She has attended classes on dialysis already.  Review of Systems    positive for weight loss loss of appetite pain in legs with walking and lying flat chest pain heart murmur constipation dizziness headaches anemia recent change in eyesight and hearing arthritis and joint pain muscle pain depression anxiety attention deficit disorder. All other review of systems are negative as documented encounter form Past Medical History  Diagnosis Date  . Chronic kidney disease     esrd  . Diabetes mellitus   . Hypertension   . Anxiety and depression   . Chronic headaches   . Diverticular disease   . Anemia   . Arthritis     osteoarthritis  . Osteoporosis   . Vitamin B 12 deficiency   . History of ITP     History  Substance Use Topics  . Smoking status: Never Smoker   . Smokeless tobacco: Never Used  . Alcohol Use: No    Family History  Problem Relation Age of Onset  . Stroke Mother   . Heart disease Father     Not on File  Current outpatient prescriptions:ALPRAZolam (XANAX) 1 MG tablet, Take 1 mg by mouth at bedtime as needed.  , Disp: , Rfl: ;  atenolol (TENORMIN) 25 MG tablet, Take 25 mg by mouth daily.  , Disp: , Rfl: ;  cloNIDine (CATAPRES) 0.1 MG tablet, Take 0.1 mg by mouth 2 (two) times daily.  , Disp: , Rfl: ;  darbepoetin (ARANESP) 150 MCG/0.3ML SOLN, Inject 150 mcg into the skin every 30 (thirty) days.  , Disp: , Rfl:  furosemide (LASIX) 20 MG tablet, Take 20 mg by mouth  2 (two) times daily.  , Disp: , Rfl: ;  oxybutynin (DITROPAN) 5 MG tablet, Take 5 mg by mouth 3 (three) times daily.  , Disp: , Rfl: ;  pioglitazone (ACTOS) 30 MG tablet, Take 30 mg by mouth daily. 1/2 tablet daily , Disp: , Rfl: ;  prednisoLONE 5 MG TABS, Take by mouth.  , Disp: , Rfl: ;  simvastatin (ZOCOR) 40 MG tablet, Take 40 mg by mouth at bedtime.  , Disp: , Rfl:   BP 171/74  Pulse 43  Temp(Src) 97.5 F (36.4 C) (Oral)  Ht 5\' 4"  (1.626 m)  Wt 136 lb (61.689 kg)  BMI 23.34 kg/m2  Body mass index is 23.34 kg/(m^2).       Objective:   Physical Exam  Constitutional: [name] oriented to person, place, and time. She appears well-developed and well-nourished.  HENT:  Head: Normocephalic and atraumatic.  Neck: Neck supple.  Cardiovascular: Normal rate and regular rhythm.        No carotid bruits.  Palpable left radial left brachial pulse  Pulmonary/Chest: Effort normal and breath sounds normal.  Abdominal: Soft.  Musculoskeletal: Normal range of motion.  Neurological: [name] alert and oriented to person, place, and time.  Skin: Skin is warm and dry.   Diagnostic studies: The cephalic  vein in the upper arm on the left measures 0.34 at the antecubital crease and tapers down to a point to .28 and .22 in the upper arm    Assessment:     Chronic renal insufficiency    Plan:     The patient is a candidate for left upper arm cephalic vein fistula. I discussed the risks and benefits of the procedure with the patient which include the risk of non-maturation the need for future interventions and the risk of steal syndrome. All the patient's questions were answered today. Her operations been scheduled for Thursday, September 20 at the patient's request.

## 2010-10-27 HISTORY — PX: VASCULAR SURGERY: SHX849

## 2010-10-31 ENCOUNTER — Other Ambulatory Visit: Payer: Self-pay | Admitting: Oncology

## 2010-10-31 ENCOUNTER — Encounter (HOSPITAL_BASED_OUTPATIENT_CLINIC_OR_DEPARTMENT_OTHER): Payer: Medicare Other | Admitting: Oncology

## 2010-10-31 DIAGNOSIS — N189 Chronic kidney disease, unspecified: Secondary | ICD-10-CM

## 2010-10-31 DIAGNOSIS — N039 Chronic nephritic syndrome with unspecified morphologic changes: Secondary | ICD-10-CM

## 2010-10-31 LAB — CBC WITH DIFFERENTIAL/PLATELET
Basophils Absolute: 0 10*3/uL (ref 0.0–0.1)
EOS%: 0.9 % (ref 0.0–7.0)
Eosinophils Absolute: 0.1 10*3/uL (ref 0.0–0.5)
HCT: 29.3 % — ABNORMAL LOW (ref 34.8–46.6)
HGB: 10 g/dL — ABNORMAL LOW (ref 11.6–15.9)
MCH: 32.8 pg (ref 25.1–34.0)
MONO#: 0.5 10*3/uL (ref 0.1–0.9)
NEUT#: 4.1 10*3/uL (ref 1.5–6.5)
NEUT%: 63.8 % (ref 38.4–76.8)
RDW: 14.6 % — ABNORMAL HIGH (ref 11.2–14.5)
WBC: 6.5 10*3/uL (ref 3.9–10.3)
lymph#: 1.8 10*3/uL (ref 0.9–3.3)

## 2010-11-12 ENCOUNTER — Encounter (HOSPITAL_COMMUNITY)
Admission: RE | Admit: 2010-11-12 | Discharge: 2010-11-12 | Disposition: A | Discharge status: Home / Self Care | Payer: Medicare Other | Source: Ambulatory Visit | Attending: Surgery | Admitting: Surgery

## 2010-11-12 LAB — SURGICAL PCR SCREEN: Staphylococcus aureus: NEGATIVE

## 2010-11-12 NOTE — Procedures (Unsigned)
CEPHALIC VEIN MAPPING  INDICATION:  Preop vein mapping for AV fistula.  HISTORY: End-stage renal disease.  EXAM:  The right cephalic vein is compressible with diameter measurements ranging from 0.32 to 0.20 cm.  The right basilic vein is compressible with diameter measurements ranging from 0.35 to 0.17 cm.  The left cephalic vein is compressible with diameter measurements ranging from 0.34 to 0.13 cm.  The left basilic vein is compressible with diameter measurements ranging from 0.22 to 0.13 cm.  See attached worksheet for all measurements.  IMPRESSION:  Patent bilateral cephalic and basilic veins with diameter measurements as described above.  ___________________________________________ V. Charlena Cross, MD  LT/MEDQ  D:  10/22/2010  T:  10/22/2010  Job:  [id number]

## 2010-11-15 ENCOUNTER — Ambulatory Visit (HOSPITAL_COMMUNITY): Payer: Medicare Other

## 2010-11-15 ENCOUNTER — Ambulatory Visit (HOSPITAL_COMMUNITY)
Admission: RE | Admit: 2010-11-15 | Discharge: 2010-11-15 | Disposition: A | Discharge status: Home / Self Care | Payer: Medicare Other | Source: Ambulatory Visit | Attending: Surgery | Admitting: Surgery

## 2010-11-15 DIAGNOSIS — E119 Type 2 diabetes mellitus without complications: Secondary | ICD-10-CM | POA: Insufficient documentation

## 2010-11-15 DIAGNOSIS — N189 Chronic kidney disease, unspecified: Secondary | ICD-10-CM | POA: Insufficient documentation

## 2010-11-15 DIAGNOSIS — Z01810 Encounter for preprocedural cardiovascular examination: Secondary | ICD-10-CM | POA: Insufficient documentation

## 2010-11-15 DIAGNOSIS — Z01812 Encounter for preprocedural laboratory examination: Secondary | ICD-10-CM | POA: Insufficient documentation

## 2010-11-15 DIAGNOSIS — I129 Hypertensive chronic kidney disease with stage 1 through stage 4 chronic kidney disease, or unspecified chronic kidney disease: Secondary | ICD-10-CM | POA: Insufficient documentation

## 2010-11-15 DIAGNOSIS — N186 End stage renal disease: Secondary | ICD-10-CM

## 2010-11-15 HISTORY — PX: AV FISTULA PLACEMENT: SHX1204

## 2010-11-15 LAB — POCT I-STAT 4, (NA,K, GLUC, HGB,HCT): Glucose, Bld: 91 mg/dL (ref 70–99)

## 2010-11-24 NOTE — Op Note (Signed)
  [name], [name]                  ACCOUNT NO.:  [id number]  MEDICAL RECORD NO.:  [id number]  LOCATION:  SDSC                         FACILITY:  [facility]  PHYSICIAN:  Juleen China IV, MDDATE OF BIRTH:  [birth year]  DATE OF PROCEDURE:  11/15/2010 DATE OF DISCHARGE:                              OPERATIVE REPORT   PREOPERATIVE DIAGNOSIS:  Chronic renal insufficiency.  POSTOPERATIVE DIAGNOSIS:  Chronic renal insufficiency.  PROCEDURE PERFORMED:  Left upper arm arteriovenous fistula.  SURGEON: 1. Olivia Cross, MD  ASSISTANT:  Newton Pigg, PA  ANESTHESIA:  MAC.  ESTIMATED BLOOD LOSS:  Minimal.  FINDINGS:  Adequate quality vein, 3 mm and artery was 3 mm.  PROCEDURE IN DETAIL:  The patient was identified in the holding area, taken to room 9, and placed supine on the table.  MAC anesthesia was administered.  The patient was prepped and draped in the usual fashion. A time-out was called.  Antibiotics were administered.  The left cephalic vein was evaluated with ultrasound and found to be widely patent.  It was on the deep side.  Lidocaine 1% was used for local anesthesia.  A transverse incision was made at the antecubital crease. Cautery was used to divide the subcutaneous tissue.  The cephalic vein was identified within the wound.  It was circumferentially dissected free and then mobilized throughout the width of the incision.  The vein was of adequate caliber, approximately 3 mm.  It was marked with an ink pen for proper orientation.  Next, the brachial artery was sharply exposed.  This was about a 3-mm artery.  It was encircled proximally and distally with vessel loops.  At this point in time, the patient was given 3000 units of heparin.  A right angle was placed on the distal vein which was then transected, the end was ligated with 2-0 silk tie. Next, Serapin clamps were placed on the artery.  A #11 blade was used to make an arteriotomy which was extended  longitudinally with Potts scissors.  The vein was cut to the appropriate length and spatulated to fit the size of the arteriotomy.  A running anastomosis was created with 6-0 Prolene prior to completion.  Appropriate flush maneuvers were performed.  The anastomosis was completed.  There was an excellent thrill within the fistula.  I inspected the tract to make sure there were no kinks.  The patient had a good Doppler signal in the radial artery at the end the case.  I did not reverse the heparin. The wound was closed in 2 layers of 3-0 Vicryl.  Dermabond was placed on the wound.  There were no complications.     Jorge Ny, MD     VWB/MEDQ  D:  11/15/2010  T:  11/15/2010  Job:  [id number]  Electronically Signed by Arelia Longest IV MD on 11/24/2010 09:58:56 AM

## 2010-11-28 ENCOUNTER — Other Ambulatory Visit: Payer: Self-pay | Admitting: Oncology

## 2010-11-28 ENCOUNTER — Encounter (HOSPITAL_BASED_OUTPATIENT_CLINIC_OR_DEPARTMENT_OTHER): Payer: Medicare Other | Admitting: Oncology

## 2010-11-28 DIAGNOSIS — N189 Chronic kidney disease, unspecified: Secondary | ICD-10-CM

## 2010-11-28 DIAGNOSIS — D631 Anemia in chronic kidney disease: Secondary | ICD-10-CM

## 2010-11-28 LAB — CBC WITH DIFFERENTIAL/PLATELET
Basophils Absolute: 0 10*3/uL (ref 0.0–0.1)
Eosinophils Absolute: 0.1 10*3/uL (ref 0.0–0.5)
HCT: 32.2 % — ABNORMAL LOW (ref 34.8–46.6)
HGB: 10.9 g/dL — ABNORMAL LOW (ref 11.6–15.9)
LYMPH%: 46.4 % (ref 14.0–49.7)
MCHC: 33.7 g/dL (ref 31.5–36.0)
MONO#: 0.6 10*3/uL (ref 0.1–0.9)
NEUT#: 1.9 10*3/uL (ref 1.5–6.5)
NEUT%: 39.6 % (ref 38.4–76.8)
Platelets: 109 10*3/uL — ABNORMAL LOW (ref 145–400)
WBC: 4.9 10*3/uL (ref 3.9–10.3)

## 2010-12-15 ENCOUNTER — Emergency Department (HOSPITAL_COMMUNITY)
Admission: EM | Admit: 2010-12-15 | Discharge: 2010-12-15 | Disposition: A | Discharge status: Home / Self Care | Payer: Medicare Other | Attending: Emergency Medicine | Admitting: Emergency Medicine

## 2010-12-15 DIAGNOSIS — Z9889 Other specified postprocedural states: Secondary | ICD-10-CM | POA: Insufficient documentation

## 2010-12-15 DIAGNOSIS — I10 Essential (primary) hypertension: Secondary | ICD-10-CM | POA: Insufficient documentation

## 2010-12-15 DIAGNOSIS — F411 Generalized anxiety disorder: Secondary | ICD-10-CM | POA: Insufficient documentation

## 2010-12-15 DIAGNOSIS — E785 Hyperlipidemia, unspecified: Secondary | ICD-10-CM | POA: Insufficient documentation

## 2010-12-15 DIAGNOSIS — E7800 Pure hypercholesterolemia, unspecified: Secondary | ICD-10-CM | POA: Insufficient documentation

## 2010-12-15 DIAGNOSIS — H5789 Other specified disorders of eye and adnexa: Secondary | ICD-10-CM | POA: Insufficient documentation

## 2010-12-15 DIAGNOSIS — H1130 Conjunctival hemorrhage, unspecified eye: Secondary | ICD-10-CM | POA: Insufficient documentation

## 2010-12-15 DIAGNOSIS — E119 Type 2 diabetes mellitus without complications: Secondary | ICD-10-CM | POA: Insufficient documentation

## 2010-12-26 ENCOUNTER — Other Ambulatory Visit: Payer: Self-pay | Admitting: Oncology

## 2010-12-26 ENCOUNTER — Encounter (HOSPITAL_BASED_OUTPATIENT_CLINIC_OR_DEPARTMENT_OTHER): Payer: Medicare Other | Admitting: Oncology

## 2010-12-26 DIAGNOSIS — N039 Chronic nephritic syndrome with unspecified morphologic changes: Secondary | ICD-10-CM

## 2010-12-26 DIAGNOSIS — E538 Deficiency of other specified B group vitamins: Secondary | ICD-10-CM

## 2010-12-26 DIAGNOSIS — N189 Chronic kidney disease, unspecified: Secondary | ICD-10-CM

## 2010-12-26 LAB — CBC WITH DIFFERENTIAL/PLATELET
BASO%: 0.6 % (ref 0.0–2.0)
Basophils Absolute: 0 10*3/uL (ref 0.0–0.1)
EOS%: 1.8 % (ref 0.0–7.0)
HCT: 37.9 % (ref 34.8–46.6)
HGB: 12.4 g/dL (ref 11.6–15.9)
LYMPH%: 36.1 % (ref 14.0–49.7)
MCH: 31.1 pg (ref 25.1–34.0)
MCHC: 32.7 g/dL (ref 31.5–36.0)
MCV: 95.1 fL (ref 79.5–101.0)
NEUT%: 49.1 % (ref 38.4–76.8)
Platelets: 102 10*3/uL — ABNORMAL LOW (ref 145–400)

## 2010-12-28 ENCOUNTER — Encounter: Payer: Self-pay | Admitting: Surgery

## 2010-12-31 ENCOUNTER — Ambulatory Visit (INDEPENDENT_AMBULATORY_CARE_PROVIDER_SITE_OTHER): Payer: Medicare Other | Admitting: *Deleted

## 2010-12-31 ENCOUNTER — Encounter: Payer: Self-pay | Admitting: Surgery

## 2010-12-31 ENCOUNTER — Ambulatory Visit (INDEPENDENT_AMBULATORY_CARE_PROVIDER_SITE_OTHER): Payer: Medicare Other | Admitting: Surgery

## 2010-12-31 ENCOUNTER — Ambulatory Visit: Payer: Medicare Other | Admitting: Surgery

## 2010-12-31 VITALS — BP 163/65 | HR 62 | Ht 63.0 in | Wt 138.0 lb

## 2010-12-31 DIAGNOSIS — N186 End stage renal disease: Secondary | ICD-10-CM

## 2010-12-31 DIAGNOSIS — Z48812 Encounter for surgical aftercare following surgery on the circulatory system: Secondary | ICD-10-CM

## 2010-12-31 NOTE — Progress Notes (Signed)
The patient is back today for followup. [name] status post left upper arm AV fistula on 11/15/2010. [name] here for her first postoperative visit. [name] doing very well has no complaints at this time she denies symptoms of steal syndrome.  On examination there is a good thrill within her fistula however azygous further up the arm it becomes a little more difficult to feel  I ordered an ultrasound today this shows the diameter of the pain as 0.42 cm in the proximal brachium and everywhere else it measures at least 0.55 cm. The depth is less than 0.6 cm except in the proximal brachium where where it is just a centimeter  This patient has not yet required renal replacement therapy I recommend waiting another 6 weeks to assess how this fistula continues to mature. If at that time we are still having issues I would recommend proceeding with branch ligation of the 2 branches that were identified near the antecubital area and in the proximal brachium patient will come by to see me in 6 weeks.

## 2011-01-07 NOTE — Procedures (Unsigned)
VASCULAR LAB EXAM  INDICATION:  Followup left arm fistula placement  HISTORY: End-stage renal disease Diabetes: Cardiac: Hypertension.  EXAM:  IMPRESSION:  Patent left arm arteriovenous fistula with depth, diameter, velocity and branch measurements all noted on the following worksheet.  ___________________________________________ V. Charlena Cross, MD  EM/MEDQ  D:  01/01/2011  T:  01/01/2011  Job:  [id number]

## 2011-01-15 ENCOUNTER — Encounter: Payer: Self-pay | Admitting: *Deleted

## 2011-01-20 DIAGNOSIS — D631 Anemia in chronic kidney disease: Secondary | ICD-10-CM | POA: Insufficient documentation

## 2011-01-20 DIAGNOSIS — D519 Vitamin B12 deficiency anemia, unspecified: Secondary | ICD-10-CM | POA: Insufficient documentation

## 2011-01-20 DIAGNOSIS — N189 Chronic kidney disease, unspecified: Secondary | ICD-10-CM | POA: Insufficient documentation

## 2011-01-20 DIAGNOSIS — D696 Thrombocytopenia, unspecified: Secondary | ICD-10-CM | POA: Insufficient documentation

## 2011-01-23 ENCOUNTER — Other Ambulatory Visit: Payer: Self-pay | Admitting: Oncology

## 2011-01-23 ENCOUNTER — Ambulatory Visit (HOSPITAL_BASED_OUTPATIENT_CLINIC_OR_DEPARTMENT_OTHER): Payer: Medicare Other | Admitting: Oncology

## 2011-01-23 ENCOUNTER — Encounter: Payer: Self-pay | Admitting: Oncology

## 2011-01-23 ENCOUNTER — Other Ambulatory Visit (HOSPITAL_BASED_OUTPATIENT_CLINIC_OR_DEPARTMENT_OTHER): Payer: Medicare Other | Admitting: Lab

## 2011-01-23 DIAGNOSIS — D649 Anemia, unspecified: Secondary | ICD-10-CM

## 2011-01-23 DIAGNOSIS — D519 Vitamin B12 deficiency anemia, unspecified: Secondary | ICD-10-CM

## 2011-01-23 DIAGNOSIS — E538 Deficiency of other specified B group vitamins: Secondary | ICD-10-CM

## 2011-01-23 DIAGNOSIS — N039 Chronic nephritic syndrome with unspecified morphologic changes: Secondary | ICD-10-CM

## 2011-01-23 DIAGNOSIS — D631 Anemia in chronic kidney disease: Secondary | ICD-10-CM

## 2011-01-23 DIAGNOSIS — N289 Disorder of kidney and ureter, unspecified: Secondary | ICD-10-CM

## 2011-01-23 DIAGNOSIS — D696 Thrombocytopenia, unspecified: Secondary | ICD-10-CM

## 2011-01-23 DIAGNOSIS — N189 Chronic kidney disease, unspecified: Secondary | ICD-10-CM

## 2011-01-23 LAB — CBC WITH DIFFERENTIAL/PLATELET
BASO%: 0.4 % (ref 0.0–2.0)
EOS%: 1.9 % (ref 0.0–7.0)
MCH: 31.1 pg (ref 25.1–34.0)
MCHC: 33.3 g/dL (ref 31.5–36.0)
MCV: 93.4 fL (ref 79.5–101.0)
MONO%: 9.4 % (ref 0.0–14.0)
NEUT%: 53.1 % (ref 38.4–76.8)
RDW: 15.4 % — ABNORMAL HIGH (ref 11.2–14.5)
lymph#: 1.6 10*3/uL (ref 0.9–3.3)

## 2011-01-23 MED ORDER — CYANOCOBALAMIN 1000 MCG/ML IJ SOLN
1000.0000 ug | INTRAMUSCULAR | Status: DC
  Administered 2011-01-23: 1000 ug via SUBCUTANEOUS

## 2011-01-23 NOTE — Progress Notes (Signed)
[facility] OFFICE PROGRESS NOTE  BURNETT,[name], MD  DIAGNOSIS: Anemia of chronic disease, vitamin B12 deficiency,     CURRENT THERAPY: Aranesp 300 subcu every 4 weeks for hemoglobin less than 11 and vitamin B 12 1000 mcg subcu monthly. [name] on chronic low dose prednisone for ITP.   INTERVAL HISTORY: [name] 77 y.o. female returns today with complaint of swelling in her lower legs and feet bilaterally. Upon further questioning she states she is scheduled to see the vascular surgeon next week to have dialysis catheter and to see her nephrologist the following week to begin dialysis. I advised her we would not recommend any diuretics or any other treatment but would defer to her kidney doctor due to her renal status. She was encouraged to contact her kidney doctor prior to her next scheduled visit should the swelling progress or become more problematic. She voiced understanding. She denies any other concerns. She continues to use chewing tobacco, and states she has thought about quitting many times but is not interested in assistance at this time.   MEDICAL HISTORY: Past Medical History  Diagnosis Date  . Chronic kidney disease     esrd  . Diabetes mellitus   . Hypertension   . Anxiety and depression   . Chronic headaches   . Diverticular disease   . Anemia   . Arthritis     osteoarthritis  . Osteoporosis   . Vitamin B 12 deficiency   . History of ITP     SURGICAL HISTORY:  Past Surgical History  Procedure Date  . Abdominal hysterectomy   . Cholecystectomy   . Joint replacement     rt hip  . Av fistula placement 11/15/10    left upper arm AVF    MEDICATIONS: Current Outpatient Prescriptions  Medication Sig Dispense Refill  . ALPRAZolam (XANAX) 1 MG tablet Take 1 mg by mouth at bedtime as needed.        Marland Kitchen atenolol (TENORMIN) 25 MG tablet Take 25 mg by mouth daily.        . cloNIDine (CATAPRES) 0.1 MG tablet Take 0.1 mg by mouth 2 (two) times daily.         . darbepoetin (ARANESP) 150 MCG/0.3ML SOLN Inject 150 mcg into the skin every 30 (thirty) days.        . furosemide (LASIX) 20 MG tablet Take 20 mg by mouth 2 (two) times daily.        Marland Kitchen oxybutynin (DITROPAN) 5 MG tablet Take 5 mg by mouth 3 (three) times daily.        . pioglitazone (ACTOS) 30 MG tablet Take 30 mg by mouth daily. 1/2 tablet daily       . prednisoLONE 5 MG TABS Take by mouth.        . simvastatin (ZOCOR) 40 MG tablet Take 40 mg by mouth at bedtime.         Current Facility-Administered Medications  Medication Dose Route Frequency Provider Last Rate Last Dose  . cyanocobalamin ((VITAMIN B-12)) injection 1,000 mcg  1,000 mcg Subcutaneous Q30 days Bobbe Medico, NP   1,000 mcg at 01/23/11 1115    ALLERGIES:   has no known allergies.  REVIEW OF SYSTEMS:  The rest of the 14-point review of system was negative.   Filed Vitals:   01/23/11 1046  BP: 134/57  Pulse: 55  Temp: 96.9 F (36.1 C)   Wt Readings from Last 3 Encounters:  01/23/11 143 lb (64.864  kg)  09/05/10 140 lb (63.504 kg)  12/31/10 138 lb (62.596 kg)   ECOG Performance status:   PHYSICAL EXAMINATION:  Patient walks with cane.   General:  well-nourished in no acute distress.  Eyes:  no scleral icterus.  ENT:  There were no oropharyngeal lesions.  Neck was without thyromegaly.  Lymphatics:  Negative cervical, supraclavicular or axillary adenopathy.  Respiratory: lungs were clear bilaterally without wheezing or crackles.  Cardiovascular:  Regular rate and rhythm, S1/S2, systolic murmur noted 2/6.   There was +2 - +3 pedal edema bilaterally.Marland Kitchen  GI:  abdomen was soft, flat, nontender, nondistended, without organomegaly.  Muscoloskeletal:  no spinal tenderness of palpation of vertebral spine.  Skin exam was with poor turgor and numerous bruises noted  Neuro exam was nonfocal.  Patient was able to get on and off exam table with assistance.  Gait was with cane.  Patient was alerted and oriented.  Attention was  good.   Language was appropriate.  Mood was normal without depression.  Speech was not pressured.  Thought content was not tangential.         LABORATORY/RADIOLOGY DATA:  Lab Results  Component Value Date   WBC 4.7 01/23/2011   HGB 11.2* 01/23/2011   HCT 33.5* 01/23/2011   PLT 78* 01/23/2011   GLUCOSE 91 11/15/2010   CHOL  Value: 182        ATP III CLASSIFICATION:  <200     mg/dL   Desirable  956-213  mg/dL   Borderline High  >=086    mg/dL   High        57/09/4694   TRIG 85 02/01/2009   HDL 106 02/01/2009   LDLCALC  Value: 59        Total Cholesterol/HDL:CHD Risk Coronary Heart Disease Risk Table                     Men   Women  1/2 Average Risk   3.4   3.3  Average Risk       5.0   4.4  2 X Average Risk   9.6   7.1  3 X Average Risk  23.4   11.0        Use the calculated Patient Ratio above and the CHD Risk Table to determine the patient's CHD Risk.        ATP III CLASSIFICATION (LDL):  <100     mg/dL   Optimal  295-284  mg/dL   Near or Above                    Optimal  130-159  mg/dL   Borderline  132-440  mg/dL   High  >102     mg/dL   Very High 72/06/3662   ALT 8 09/05/2010   ALT 8 09/05/2010   ALT 8 09/05/2010   AST 13 09/05/2010   AST 13 09/05/2010   AST 13 09/05/2010   NA 139 11/15/2010   K 4.4 11/15/2010   CL 103 09/05/2010   CL 103 09/05/2010   CL 103 09/05/2010   CREATININE 2.48* 09/05/2010   CREATININE 2.48* 09/05/2010   CREATININE 2.48* 09/05/2010   BUN 46* 09/05/2010   BUN 46* 09/05/2010   BUN 46* 09/05/2010   CO2 27 09/05/2010   CO2 27 09/05/2010   CO2 27 09/05/2010   TSH 3.719 08/12/2008   INR 1.1 07/21/2008   HGBA1C  Value: 7.1 (NOTE) The ADA recommends the following therapeutic  goal for glycemic control related to Hgb A1c measurement: Goal of therapy: <6.5 Hgb A1c  Reference: American Diabetes Association: Clinical Practice Recommendations 2010, Diabetes Care, 2010, 33: (Suppl  1).* 05/22/2009    No results found.     ASSESSMENT AND PLAN: This is a 77 yr old female with  following issues: 1. History of B12 deficiency - she continues to receive monthly vitamin B 12 1000 mcg IM. 2. History of renal insufficiency secondary to most likely diabetes and hypertension. Per patient she will have dialysis catheter placed next week and possibly begin dialysis in mid December. 3. Renal insufficiency induced chronic anemia - she received Aranesp 300 mcg subcu every 4 weeks for hemoglobin less than 11 4. Thrombocytopenia from presumed ITP. She continues on low dose prednisone. No obvious signs or symptoms bleeding. 5. Diabetes type 2 - she is pioglitazone per PCP 6. Hyperlipidemia- she is on Zetia and simvastatin per PCP 7. Hypertension - she is on atenolol, lisinopril/thiazide per PCP 8. Prophylaxis - while on prednisone she is on calcium BID 9. Follow up - she will follow up with Dr. Gaylyn Rong in 4 months (March 2013) but knows she may be seen before then if she has questions, concerns or other issues arise. She will continue with monthly labs and Vitamin B 12 shots and Aranesp when hgb <11.    Discussed with Dr. Gaylyn Rong.

## 2011-01-25 LAB — FERRITIN: Ferritin: 826 ng/mL — ABNORMAL HIGH (ref 10–291)

## 2011-01-25 LAB — IRON AND TIBC
%SAT: 38 % (ref 20–55)
TIBC: 261 ug/dL (ref 250–470)

## 2011-01-25 LAB — COMPREHENSIVE METABOLIC PANEL
ALT: 12 U/L (ref 0–35)
AST: 14 U/L (ref 0–37)
Alkaline Phosphatase: 33 U/L — ABNORMAL LOW (ref 39–117)
BUN: 47 mg/dL — ABNORMAL HIGH (ref 6–23)
Calcium: 9.1 mg/dL (ref 8.4–10.5)
Chloride: 103 mEq/L (ref 96–112)
Creatinine, Ser: 2.19 mg/dL — ABNORMAL HIGH (ref 0.50–1.10)
Potassium: 4.5 mEq/L (ref 3.5–5.3)

## 2011-01-25 LAB — VITAMIN B12: Vitamin B-12: 644 pg/mL (ref 211–911)

## 2011-02-04 LAB — METHYLMALONIC ACID, SERUM: Methylmalonic Acid, Quant: 0.73 umol/L — ABNORMAL HIGH (ref <0.40)

## 2011-02-08 ENCOUNTER — Encounter: Payer: Self-pay | Admitting: Surgery

## 2011-02-11 ENCOUNTER — Encounter: Payer: Self-pay | Admitting: Surgery

## 2011-02-11 ENCOUNTER — Ambulatory Visit (INDEPENDENT_AMBULATORY_CARE_PROVIDER_SITE_OTHER): Payer: Medicare Other | Admitting: Surgery

## 2011-02-11 VITALS — BP 176/74 | HR 67 | Resp 16 | Ht 63.0 in | Wt 139.0 lb

## 2011-02-11 DIAGNOSIS — N186 End stage renal disease: Secondary | ICD-10-CM

## 2011-02-11 NOTE — Progress Notes (Signed)
[name] is back today for followup of her left upper arm AV fistula which was created on 11/15/2010. Her initial ultrasound revealed a vein measuring approximately 0.5 cm. It was slightly on the deep side as we went up the arm. There were 2 main branches. I have observed her for the past several weeks and she is back today for a recheck.  On examination there is an excellent thrill in the antecubital area however as you go further up the arm it becomes more difficult to feel.  I have recommended that we proceed with superficialization of her fistula. At this time out also ligate the to competing branches. This is been scheduled for Thursday, January 3. 

## 2011-02-12 ENCOUNTER — Encounter (HOSPITAL_COMMUNITY): Payer: Self-pay

## 2011-02-12 ENCOUNTER — Other Ambulatory Visit: Payer: Self-pay

## 2011-02-20 ENCOUNTER — Other Ambulatory Visit (HOSPITAL_BASED_OUTPATIENT_CLINIC_OR_DEPARTMENT_OTHER): Payer: Medicare Other | Admitting: Lab

## 2011-02-20 ENCOUNTER — Ambulatory Visit (HOSPITAL_BASED_OUTPATIENT_CLINIC_OR_DEPARTMENT_OTHER): Payer: Medicare Other

## 2011-02-20 ENCOUNTER — Encounter (HOSPITAL_COMMUNITY): Payer: Self-pay

## 2011-02-20 ENCOUNTER — Encounter (HOSPITAL_COMMUNITY)
Admission: RE | Admit: 2011-02-20 | Discharge: 2011-02-20 | Disposition: A | Discharge status: Home / Self Care | Payer: Medicare Other | Source: Ambulatory Visit | Attending: Surgery | Admitting: Surgery

## 2011-02-20 DIAGNOSIS — D519 Vitamin B12 deficiency anemia, unspecified: Secondary | ICD-10-CM

## 2011-02-20 DIAGNOSIS — N189 Chronic kidney disease, unspecified: Secondary | ICD-10-CM

## 2011-02-20 DIAGNOSIS — D518 Other vitamin B12 deficiency anemias: Secondary | ICD-10-CM

## 2011-02-20 DIAGNOSIS — D696 Thrombocytopenia, unspecified: Secondary | ICD-10-CM

## 2011-02-20 DIAGNOSIS — N039 Chronic nephritic syndrome with unspecified morphologic changes: Secondary | ICD-10-CM

## 2011-02-20 DIAGNOSIS — D631 Anemia in chronic kidney disease: Secondary | ICD-10-CM

## 2011-02-20 HISTORY — DX: Anxiety disorder, unspecified: F41.9

## 2011-02-20 HISTORY — DX: Cardiac murmur, unspecified: R01.1

## 2011-02-20 LAB — CBC WITH DIFFERENTIAL/PLATELET
BASO%: 0.4 % (ref 0.0–2.0)
Eosinophils Absolute: 0.1 10*3/uL (ref 0.0–0.5)
MCHC: 33.8 g/dL (ref 31.5–36.0)
MCV: 93.6 fL (ref 79.5–101.0)
MONO#: 0.5 10*3/uL (ref 0.1–0.9)
MONO%: 10.9 % (ref 0.0–14.0)
NEUT#: 2.5 10*3/uL (ref 1.5–6.5)
RBC: 3.05 10*6/uL — ABNORMAL LOW (ref 3.70–5.45)
RDW: 15.8 % — ABNORMAL HIGH (ref 11.2–14.5)
WBC: 4.9 10*3/uL (ref 3.9–10.3)

## 2011-02-20 LAB — SURGICAL PCR SCREEN: MRSA, PCR: NEGATIVE

## 2011-02-20 MED ORDER — DARBEPOETIN ALFA-POLYSORBATE 500 MCG/ML IJ SOLN
300.0000 ug | Freq: Once | INTRAMUSCULAR | Status: AC
  Administered 2011-02-20: 300 ug via SUBCUTANEOUS
  Filled 2011-02-20: qty 1

## 2011-02-20 MED ORDER — CYANOCOBALAMIN 1000 MCG/ML IJ SOLN
1000.0000 ug | INTRAMUSCULAR | Status: DC
  Administered 2011-02-20: 1000 ug via SUBCUTANEOUS

## 2011-02-20 NOTE — Pre-Procedure Instructions (Signed)
20 CLAIRESSA BOULET  02/20/2011   Your procedure is scheduled on:  Thursday, Jan 3  Report to [facility] at 7:30 AM.  Call this number if you have problems the morning of surgery: [phone]   Remember:   Do not eat food after midnight the night before surgery.  May have clear liquids: up to 4 Hours before arrival.  Clear liquids include soda, tea, black coffee, apple or grape juice, broth.  Take these medicines the morning of surgery with A SIP OF WATER: *Atenolol,Clonidine, Prednisone**   Do not wear jewelry, make-up or nail polish.  Do not wear lotions, powders, or perfumes. You may wear deodorant.  Do not shave 48 hours prior to surgery.  Do not bring valuables to the hospital.  Contacts, dentures or bridgework may not be worn into surgery.  Leave suitcase in the car. After surgery it may be brought to your room.  For patients admitted to the hospital, checkout time is 11:00 AM the day of discharge.   Patients discharged the day of surgery will not be allowed to drive home.  Name and phone number of your driver: Daughter, Jasmine December  Special Instructions: CHG Shower Use Special Wash: 1/2 bottle night before surgery and 1/2 bottle morning of surgery.   Please read over the following fact sheets that you were given: Pain Booklet, MRSA Information and Surgical Site Infection Prevention

## 2011-02-22 ENCOUNTER — Ambulatory Visit: Payer: Medicare Other

## 2011-02-27 MED ORDER — DEXTROSE 5 % IV SOLN
1.5000 g | INTRAVENOUS | Status: AC
  Administered 2011-02-28: 1.5 g via INTRAVENOUS
  Filled 2011-02-27: qty 1.5

## 2011-02-28 ENCOUNTER — Ambulatory Visit (HOSPITAL_COMMUNITY): Payer: Medicare Other

## 2011-02-28 ENCOUNTER — Encounter (HOSPITAL_COMMUNITY): Payer: Self-pay | Admitting: Anesthesiology

## 2011-02-28 ENCOUNTER — Encounter (HOSPITAL_COMMUNITY)
Admission: RE | Disposition: A | Discharge status: Home / Self Care | Payer: Self-pay | Source: Ambulatory Visit | Attending: Surgery

## 2011-02-28 ENCOUNTER — Ambulatory Visit (HOSPITAL_COMMUNITY): Payer: Medicare Other | Admitting: Anesthesiology

## 2011-02-28 ENCOUNTER — Ambulatory Visit (HOSPITAL_COMMUNITY)
Admission: RE | Admit: 2011-02-28 | Discharge: 2011-02-28 | Disposition: A | Discharge status: Home / Self Care | Payer: Medicare Other | Source: Ambulatory Visit | Attending: Surgery | Admitting: Surgery

## 2011-02-28 DIAGNOSIS — K08109 Complete loss of teeth, unspecified cause, unspecified class: Secondary | ICD-10-CM | POA: Insufficient documentation

## 2011-02-28 DIAGNOSIS — N186 End stage renal disease: Secondary | ICD-10-CM | POA: Insufficient documentation

## 2011-02-28 DIAGNOSIS — I120 Hypertensive chronic kidney disease with stage 5 chronic kidney disease or end stage renal disease: Secondary | ICD-10-CM | POA: Insufficient documentation

## 2011-02-28 DIAGNOSIS — Y832 Surgical operation with anastomosis, bypass or graft as the cause of abnormal reaction of the patient, or of later complication, without mention of misadventure at the time of the procedure: Secondary | ICD-10-CM | POA: Insufficient documentation

## 2011-02-28 DIAGNOSIS — T82897A Other specified complication of cardiac prosthetic devices, implants and grafts, initial encounter: Secondary | ICD-10-CM

## 2011-02-28 DIAGNOSIS — T82598A Other mechanical complication of other cardiac and vascular devices and implants, initial encounter: Secondary | ICD-10-CM | POA: Insufficient documentation

## 2011-02-28 DIAGNOSIS — E119 Type 2 diabetes mellitus without complications: Secondary | ICD-10-CM | POA: Insufficient documentation

## 2011-02-28 LAB — GLUCOSE, CAPILLARY: Glucose-Capillary: 118 mg/dL — ABNORMAL HIGH (ref 70–99)

## 2011-02-28 LAB — POCT I-STAT 4, (NA,K, GLUC, HGB,HCT): Potassium: 4.7 mEq/L (ref 3.5–5.1)

## 2011-02-28 SURGERY — REVISION OF ARTERIOVENOUS GORETEX GRAFT
Anesthesia: Monitor Anesthesia Care | Site: Arm Upper | Laterality: Left

## 2011-02-28 MED ORDER — SODIUM CHLORIDE 0.9 % IV SOLN
INTRAVENOUS | Status: DC | PRN
  Administered 2011-02-28: 11:00:00 via INTRAVENOUS

## 2011-02-28 MED ORDER — PROPOFOL 10 MG/ML IV EMUL
INTRAVENOUS | Status: DC | PRN
  Administered 2011-02-28: 100 ug/kg/min via INTRAVENOUS

## 2011-02-28 MED ORDER — OXYCODONE HCL 5 MG PO TABS: 5.0000 mg | ORAL_TABLET | ORAL | Status: DC | PRN

## 2011-02-28 MED ORDER — OXYCODONE HCL 5 MG PO TABS: 5.0000 mg | ORAL_TABLET | ORAL | Status: AC | PRN

## 2011-02-28 MED ORDER — FENTANYL CITRATE 0.05 MG/ML IJ SOLN
INTRAMUSCULAR | Status: DC | PRN
  Administered 2011-02-28 (×2): 50 ug via INTRAVENOUS

## 2011-02-28 MED ORDER — INSULIN ASPART 100 UNIT/ML ~~LOC~~ SOLN: 0.0000 [IU] | Freq: Three times a day (TID) | SUBCUTANEOUS | Status: DC

## 2011-02-28 MED ORDER — LIDOCAINE HCL (CARDIAC) 20 MG/ML IV SOLN
INTRAVENOUS | Status: DC | PRN
  Administered 2011-02-28: 60 mg via INTRAVENOUS

## 2011-02-28 MED ORDER — SODIUM CHLORIDE 0.9 % IR SOLN
Status: DC | PRN
  Administered 2011-02-28: 1000 mL

## 2011-02-28 MED ORDER — HEMOSTATIC AGENTS (NO CHARGE) OPTIME
TOPICAL | Status: DC | PRN
  Administered 2011-02-28: 1 via TOPICAL

## 2011-02-28 MED ORDER — HYDROMORPHONE HCL PF 1 MG/ML IJ SOLN: 0.2500 mg | INTRAMUSCULAR | Status: DC | PRN

## 2011-02-28 MED ORDER — SODIUM CHLORIDE 0.9 % IV SOLN
INTRAVENOUS | Status: DC
  Administered 2011-02-28: 11:00:00 via INTRAVENOUS

## 2011-02-28 MED ORDER — SODIUM CHLORIDE 0.9 % IR SOLN
Status: DC | PRN
  Administered 2011-02-28: 11:00:00

## 2011-02-28 MED ORDER — LIDOCAINE-EPINEPHRINE (PF) 1 %-1:200000 IJ SOLN
INTRAMUSCULAR | Status: DC | PRN
  Administered 2011-02-28: 15 mL

## 2011-02-28 MED ORDER — ONDANSETRON HCL 4 MG/2ML IJ SOLN: 4.0000 mg | Freq: Once | INTRAMUSCULAR | Status: DC | PRN

## 2011-02-28 SURGICAL SUPPLY — 39 items
ADH SKN CLS APL DERMABOND .7 (GAUZE/BANDAGES/DRESSINGS) ×1
CANISTER SUCTION 2500CC (MISCELLANEOUS) ×2 IMPLANT
CLIP TI MEDIUM 6 (CLIP) ×2 IMPLANT
CLIP TI WIDE RED SMALL 6 (CLIP) ×2 IMPLANT
CLOTH BEACON ORANGE TIMEOUT ST (SAFETY) ×2 IMPLANT
COVER SURGICAL LIGHT HANDLE (MISCELLANEOUS) ×4 IMPLANT
DERMABOND ADVANCED (GAUZE/BANDAGES/DRESSINGS) ×1
DERMABOND ADVANCED .7 DNX12 (GAUZE/BANDAGES/DRESSINGS) ×1 IMPLANT
ELECT REM PT RETURN 9FT ADLT (ELECTROSURGICAL) ×2
ELECTRODE REM PT RTRN 9FT ADLT (ELECTROSURGICAL) ×1 IMPLANT
GEL ULTRASOUND 20GR AQUASONIC (MISCELLANEOUS) IMPLANT
GLOVE BIO SURGEON STRL SZ 6.5 (GLOVE) ×1 IMPLANT
GLOVE BIOGEL PI IND STRL 7.0 (GLOVE) IMPLANT
GLOVE BIOGEL PI IND STRL 7.5 (GLOVE) ×1 IMPLANT
GLOVE BIOGEL PI INDICATOR 7.0 (GLOVE) ×2
GLOVE BIOGEL PI INDICATOR 7.5 (GLOVE) ×1
GLOVE SS BIOGEL STRL SZ 7 (GLOVE) IMPLANT
GLOVE SUPERSENSE BIOGEL SZ 7 (GLOVE) ×1
GLOVE SURG SS PI 7.5 STRL IVOR (GLOVE) ×2 IMPLANT
GOWN PREVENTION PLUS XXLARGE (GOWN DISPOSABLE) ×3 IMPLANT
GOWN STRL NON-REIN LRG LVL3 (GOWN DISPOSABLE) ×3 IMPLANT
HEMOSTAT SNOW SURGICEL 2X4 (HEMOSTASIS) ×1 IMPLANT
HEMOSTAT SURGICEL 2X14 (HEMOSTASIS) IMPLANT
KIT BASIN OR (CUSTOM PROCEDURE TRAY) ×2 IMPLANT
KIT ROOM TURNOVER OR (KITS) ×2 IMPLANT
NS IRRIG 1000ML POUR BTL (IV SOLUTION) ×2 IMPLANT
PACK CV ACCESS (CUSTOM PROCEDURE TRAY) ×2 IMPLANT
PAD ARMBOARD 7.5X6 YLW CONV (MISCELLANEOUS) ×4 IMPLANT
SUT PROLENE 6 0 BV (SUTURE) ×4 IMPLANT
SUT PROLENE 6 0 CC 1 (SUTURE) ×3 IMPLANT
SUT SILK 3 0 (SUTURE) ×2
SUT SILK 3-0 18XBRD TIE 12 (SUTURE) IMPLANT
SUT VIC AB 3-0 SH 27 (SUTURE) ×4
SUT VIC AB 3-0 SH 27X BRD (SUTURE) ×2 IMPLANT
SUT VICRYL 4-0 PS2 18IN ABS (SUTURE) ×3 IMPLANT
TOWEL OR 17X24 6PK STRL BLUE (TOWEL DISPOSABLE) ×2 IMPLANT
TOWEL OR 17X26 10 PK STRL BLUE (TOWEL DISPOSABLE) ×2 IMPLANT
UNDERPAD 30X30 INCONTINENT (UNDERPADS AND DIAPERS) ×2 IMPLANT
WATER STERILE IRR 1000ML POUR (IV SOLUTION) ×2 IMPLANT

## 2011-02-28 NOTE — Interval H&P Note (Signed)
History and Physical Interval Note:  02/28/2011 10:40 AM  [name]  has presented today for surgery, with the diagnosis of End Stage Renal Disease  The various methods of treatment have been discussed with the patient and family. After consideration of risks, benefits and other options for treatment, the patient has consented to  Procedure(s): REVISION OF ARTERIOVENOUS GORETEX GRAFT as a surgical intervention .  The patients' history has been reviewed, patient examined, no change in status, stable for surgery.  I have reviewed the patients' chart and labs.  Questions were answered to the patient's satisfaction.     Stasha Naraine IV, V. WELLS

## 2011-02-28 NOTE — Transfer of Care (Signed)
Immediate Anesthesia Transfer of Care Note  Patient: [name]  Procedure(s) Performed:  REVISION OF ARTERIOVENOUS GORETEX GRAFT - Superfitilization left upper extremity arteriovenous fistula, with ligatin of competing branches  Patient Location: PACU  Anesthesia Type: MAC  Level of Consciousness: awake, alert  and oriented  Airway & Oxygen Therapy: Patient Spontanous Breathing and Patient connected to face mask oxygen  Post-op Assessment: Report given to PACU RN and Post -op Vital signs reviewed and stable  Post vital signs: Reviewed and stable  Complications: No apparent anesthesia complications

## 2011-02-28 NOTE — H&P (View-Only) (Signed)
[name] is back today for followup of her left upper arm AV fistula which was created on 11/15/2010. Her initial ultrasound revealed a vein measuring approximately 0.5 cm. It was slightly on the deep side as we went up the arm. There were 2 main branches. I have observed her for the past several weeks and she is back today for a recheck.  On examination there is an excellent thrill in the antecubital area however as you go further up the arm it becomes more difficult to feel.  I have recommended that we proceed with superficialization of her fistula. At this time out also ligate the to competing branches. This is been scheduled for Thursday, January 3.

## 2011-02-28 NOTE — Preoperative (Signed)
Beta Blockers   Reason not to administer Beta Blockers:Not Applicable 

## 2011-02-28 NOTE — Anesthesia Postprocedure Evaluation (Signed)
  Anesthesia Post-op Note  Patient: [name]  Procedure(s) Performed:  REVISION OF ARTERIOVENOUS GORETEX GRAFT - Superfitilization left upper extremity arteriovenous fistula, with ligatin of competing branches  Patient Location: PACU  Anesthesia Type: MAC  Level of Consciousness: awake, alert , oriented and patient cooperative  Airway and Oxygen Therapy: Patient Spontanous Breathing and Patient connected to nasal cannula oxygen  Post-op Pain: mild  Post-op Assessment: Post-op Vital signs reviewed, Patient's Cardiovascular Status Stable, Respiratory Function Stable, Patent Airway, No signs of Nausea or vomiting and Pain level controlled  Post-op Vital Signs: stable  Complications: No apparent anesthesia complications

## 2011-02-28 NOTE — Op Note (Signed)
[facility]  Patient name: [name] MRN: [id number] DOB: 1932/10/05 Sex: female  02/28/2011 Pre-operative Diagnosis: Non-maturing left upper arm AV fistula Post-operative diagnosis:  Same Surgeon:  Jorge Ny Assistants:  Della Goo Procedure:   Transposition/defatting, left upper arm fistula Anesthesia:  MAC Blood Loss:  See anesthesia record Specimens:  None  Findings:  2 large branches were divided during mobilization of the fistula.  Indications:  The patient is previously undergone a left upper arm AV fistula. It was evaluated with with ultrasound and found to be of adequate diameter however it was on the deep side. She also had 2 large competing branches. She comes in today for defatting and branch ligation  Procedure:  The patient was identified in the holding area and taken to [facility] OR ROOM 08  The patient was then placed supine on the table. IV sedation anesthesia was administered.  The patient was prepped and draped in the usual sterile fashion.  A time out was called and antibiotics were administered.  One percent lidocaine was used for local anesthesia. 2 skip incisions were made over the top of the fistula. The fistula was fully mobilized sharply throughout its course in the upper arm. In mobilizing the fistula 2 large branches were encountered one within each incision. They were each individually ligated between 2-0 silk ties. Once the fistula was fully mobilized the soft tissue deep to the fistula was reapproximated with 3-0 Vicryl and the skin was closed directly over top of the fistula. Dermabond placed on the wounds there no complications   Disposition:  To PACU in stable condition.   Juleen China, M.D. [facility] Office: [phone] Pager:  [phone]

## 2011-02-28 NOTE — Anesthesia Preprocedure Evaluation (Signed)
Anesthesia Evaluation  Patient identified by MRN, date of birth, ID band Patient awake    Reviewed: Allergy & Precautions, H&P , NPO status , Patient's Chart, lab work & pertinent test results  Airway Mallampati: II TM Distance: <3 FB Neck ROM: full    Dental  (+) Upper Dentures and Lower Dentures   Pulmonary          Cardiovascular hypertension, + Valvular Problems/Murmurs regular Normal    Neuro/Psych Negative Neurological ROS  Negative Psych ROS   GI/Hepatic negative GI ROS, Neg liver ROS,   Endo/Other  Diabetes mellitus-, Type 2, Oral Hypoglycemic Agents  Renal/GU ESRF and CRF  Genitourinary negative   Musculoskeletal   Abdominal   Peds  Hematology negative hematology ROS (+)   Anesthesia Other Findings   Reproductive/Obstetrics                           Anesthesia Physical Anesthesia Plan  ASA: III  Anesthesia Plan: MAC   Post-op Pain Management:    Induction: Intravenous  Airway Management Planned: Mask  Additional Equipment:   Intra-op Plan:   Post-operative Plan:   Informed Consent: I have reviewed the patients History and Physical, chart, labs and discussed the procedure including the risks, benefits and alternatives for the proposed anesthesia with the patient or authorized representative who has indicated his/her understanding and acceptance.     Plan Discussed with: Anesthesiologist, CRNA and Surgeon  Anesthesia Plan Comments:         Anesthesia Quick Evaluation

## 2011-03-08 ENCOUNTER — Encounter: Payer: Self-pay | Admitting: Surgery

## 2011-03-11 ENCOUNTER — Ambulatory Visit (INDEPENDENT_AMBULATORY_CARE_PROVIDER_SITE_OTHER): Payer: Medicare Other | Admitting: Surgery

## 2011-03-11 ENCOUNTER — Encounter: Payer: Self-pay | Admitting: Surgery

## 2011-03-11 DIAGNOSIS — T82898A Other specified complication of vascular prosthetic devices, implants and grafts, initial encounter: Secondary | ICD-10-CM

## 2011-03-11 DIAGNOSIS — N186 End stage renal disease: Secondary | ICD-10-CM

## 2011-03-11 NOTE — Progress Notes (Signed)
The patient comes in today for followup. [name] recently status post the superficialization of her left upper arm fistula.  On examination there is excellent thrill within her fistula.  I believe this will be ready for use in one month

## 2011-03-20 ENCOUNTER — Other Ambulatory Visit (HOSPITAL_BASED_OUTPATIENT_CLINIC_OR_DEPARTMENT_OTHER): Payer: Medicare Other | Admitting: Lab

## 2011-03-20 ENCOUNTER — Ambulatory Visit (HOSPITAL_BASED_OUTPATIENT_CLINIC_OR_DEPARTMENT_OTHER): Payer: Medicare Other

## 2011-03-20 DIAGNOSIS — N189 Chronic kidney disease, unspecified: Secondary | ICD-10-CM

## 2011-03-20 DIAGNOSIS — D518 Other vitamin B12 deficiency anemias: Secondary | ICD-10-CM

## 2011-03-20 DIAGNOSIS — D696 Thrombocytopenia, unspecified: Secondary | ICD-10-CM

## 2011-03-20 DIAGNOSIS — D631 Anemia in chronic kidney disease: Secondary | ICD-10-CM

## 2011-03-20 DIAGNOSIS — N039 Chronic nephritic syndrome with unspecified morphologic changes: Secondary | ICD-10-CM

## 2011-03-20 DIAGNOSIS — D519 Vitamin B12 deficiency anemia, unspecified: Secondary | ICD-10-CM

## 2011-03-20 LAB — CBC WITH DIFFERENTIAL/PLATELET
Eosinophils Absolute: 0 10*3/uL (ref 0.0–0.5)
HCT: 29.8 % — ABNORMAL LOW (ref 34.8–46.6)
LYMPH%: 35.6 % (ref 14.0–49.7)
MCHC: 32.8 g/dL (ref 31.5–36.0)
MCV: 95 fL (ref 79.5–101.0)
MONO#: 0.5 10*3/uL (ref 0.1–0.9)
MONO%: 10.6 % (ref 0.0–14.0)
NEUT%: 52.5 % (ref 38.4–76.8)
Platelets: 96 10*3/uL — ABNORMAL LOW (ref 145–400)
WBC: 5.1 10*3/uL (ref 3.9–10.3)

## 2011-03-20 MED ORDER — DARBEPOETIN ALFA-POLYSORBATE 500 MCG/ML IJ SOLN
300.0000 ug | Freq: Once | INTRAMUSCULAR | Status: AC
  Administered 2011-03-20: 300 ug via SUBCUTANEOUS
  Filled 2011-03-20: qty 1

## 2011-03-20 MED ORDER — CYANOCOBALAMIN 1000 MCG/ML IJ SOLN
1000.0000 ug | INTRAMUSCULAR | Status: DC
  Administered 2011-03-20: 1000 ug via SUBCUTANEOUS

## 2011-03-21 ENCOUNTER — Other Ambulatory Visit: Payer: Medicare Other | Admitting: Lab

## 2011-03-22 ENCOUNTER — Other Ambulatory Visit: Payer: Medicare Other | Admitting: Lab

## 2011-03-25 ENCOUNTER — Other Ambulatory Visit: Payer: Self-pay | Admitting: Oncology

## 2011-03-25 ENCOUNTER — Other Ambulatory Visit: Payer: Medicare Other | Admitting: Lab

## 2011-04-24 ENCOUNTER — Ambulatory Visit (HOSPITAL_BASED_OUTPATIENT_CLINIC_OR_DEPARTMENT_OTHER): Payer: Medicare Other

## 2011-04-24 ENCOUNTER — Other Ambulatory Visit (HOSPITAL_BASED_OUTPATIENT_CLINIC_OR_DEPARTMENT_OTHER): Payer: Medicare Other | Admitting: Lab

## 2011-04-24 VITALS — BP 133/71 | HR 58 | Temp 97.9°F

## 2011-04-24 DIAGNOSIS — D519 Vitamin B12 deficiency anemia, unspecified: Secondary | ICD-10-CM

## 2011-04-24 DIAGNOSIS — D696 Thrombocytopenia, unspecified: Secondary | ICD-10-CM

## 2011-04-24 DIAGNOSIS — N039 Chronic nephritic syndrome with unspecified morphologic changes: Secondary | ICD-10-CM

## 2011-04-24 DIAGNOSIS — D518 Other vitamin B12 deficiency anemias: Secondary | ICD-10-CM

## 2011-04-24 DIAGNOSIS — N189 Chronic kidney disease, unspecified: Secondary | ICD-10-CM

## 2011-04-24 LAB — CBC WITH DIFFERENTIAL/PLATELET
BASO%: 0.4 % (ref 0.0–2.0)
Eosinophils Absolute: 0 10*3/uL (ref 0.0–0.5)
LYMPH%: 37.7 % (ref 14.0–49.7)
MCHC: 32.6 g/dL (ref 31.5–36.0)
MONO#: 0.6 10*3/uL (ref 0.1–0.9)
NEUT#: 3 10*3/uL (ref 1.5–6.5)
RBC: 3.68 10*6/uL — ABNORMAL LOW (ref 3.70–5.45)
RDW: 15.5 % — ABNORMAL HIGH (ref 11.2–14.5)
WBC: 5.9 10*3/uL (ref 3.9–10.3)
lymph#: 2.2 10*3/uL (ref 0.9–3.3)

## 2011-04-24 LAB — VITAMIN B12: Vitamin B-12: 737 pg/mL (ref 211–911)

## 2011-04-24 MED ORDER — CYANOCOBALAMIN 1000 MCG/ML IJ SOLN
1000.0000 ug | INTRAMUSCULAR | Status: DC
  Administered 2011-04-24: 1000 ug via SUBCUTANEOUS

## 2011-04-24 NOTE — Progress Notes (Signed)
Pt entered center today   For labs and injection appointment. Labs noted to be  Hemoglobin of 11.2 and hematocrit of 34.2. Aranesp Injection held per orders.  Vit B  Injection given as ordered. Pt  Instructed to keep scheduled appointments and to call should  Issues occur.  Pt verbalized understanding of instructions.

## 2011-05-22 ENCOUNTER — Other Ambulatory Visit (HOSPITAL_BASED_OUTPATIENT_CLINIC_OR_DEPARTMENT_OTHER): Payer: Medicare Other | Admitting: Lab

## 2011-05-22 ENCOUNTER — Ambulatory Visit (HOSPITAL_BASED_OUTPATIENT_CLINIC_OR_DEPARTMENT_OTHER): Payer: Medicare Other

## 2011-05-22 VITALS — BP 158/69 | HR 58 | Temp 96.9°F

## 2011-05-22 DIAGNOSIS — D696 Thrombocytopenia, unspecified: Secondary | ICD-10-CM

## 2011-05-22 DIAGNOSIS — N189 Chronic kidney disease, unspecified: Secondary | ICD-10-CM

## 2011-05-22 DIAGNOSIS — N039 Chronic nephritic syndrome with unspecified morphologic changes: Secondary | ICD-10-CM

## 2011-05-22 DIAGNOSIS — D518 Other vitamin B12 deficiency anemias: Secondary | ICD-10-CM

## 2011-05-22 DIAGNOSIS — D631 Anemia in chronic kidney disease: Secondary | ICD-10-CM

## 2011-05-22 DIAGNOSIS — D519 Vitamin B12 deficiency anemia, unspecified: Secondary | ICD-10-CM

## 2011-05-22 LAB — CBC WITH DIFFERENTIAL/PLATELET
Basophils Absolute: 0 10*3/uL (ref 0.0–0.1)
EOS%: 1.5 % (ref 0.0–7.0)
HGB: 10.8 g/dL — ABNORMAL LOW (ref 11.6–15.9)
MCH: 30.9 pg (ref 25.1–34.0)
MONO#: 0.6 10*3/uL (ref 0.1–0.9)
NEUT#: 4.1 10*3/uL (ref 1.5–6.5)
RDW: 15.6 % — ABNORMAL HIGH (ref 11.2–14.5)
WBC: 6.6 10*3/uL (ref 3.9–10.3)
lymph#: 1.8 10*3/uL (ref 0.9–3.3)

## 2011-05-22 MED ORDER — CYANOCOBALAMIN 1000 MCG/ML IJ SOLN: 1000.0000 ug | INTRAMUSCULAR | Status: DC

## 2011-05-22 MED ORDER — CYANOCOBALAMIN 1000 MCG/ML IJ SOLN
1000.0000 ug | INTRAMUSCULAR | Status: DC
  Administered 2011-05-22: 1000 ug via SUBCUTANEOUS

## 2011-05-22 MED ORDER — DARBEPOETIN ALFA-POLYSORBATE 500 MCG/ML IJ SOLN
300.0000 ug | Freq: Once | INTRAMUSCULAR | Status: AC
  Administered 2011-05-22: 300 ug via SUBCUTANEOUS
  Filled 2011-05-22: qty 1

## 2011-05-27 ENCOUNTER — Ambulatory Visit: Payer: Medicare Other | Admitting: Oncology

## 2011-05-29 ENCOUNTER — Other Ambulatory Visit: Payer: Self-pay | Admitting: *Deleted

## 2011-06-19 ENCOUNTER — Telehealth: Payer: Self-pay | Admitting: Oncology

## 2011-06-19 ENCOUNTER — Ambulatory Visit (HOSPITAL_BASED_OUTPATIENT_CLINIC_OR_DEPARTMENT_OTHER): Payer: Medicare Other | Admitting: Oncology

## 2011-06-19 ENCOUNTER — Ambulatory Visit: Payer: Medicare Other

## 2011-06-19 ENCOUNTER — Other Ambulatory Visit: Payer: Medicare Other | Admitting: Lab

## 2011-06-19 VITALS — BP 169/63 | HR 66 | Temp 97.5°F | Ht 63.5 in | Wt 127.4 lb

## 2011-06-19 DIAGNOSIS — N189 Chronic kidney disease, unspecified: Secondary | ICD-10-CM

## 2011-06-19 DIAGNOSIS — D518 Other vitamin B12 deficiency anemias: Secondary | ICD-10-CM

## 2011-06-19 DIAGNOSIS — N039 Chronic nephritic syndrome with unspecified morphologic changes: Secondary | ICD-10-CM

## 2011-06-19 DIAGNOSIS — D519 Vitamin B12 deficiency anemia, unspecified: Secondary | ICD-10-CM

## 2011-06-19 DIAGNOSIS — D631 Anemia in chronic kidney disease: Secondary | ICD-10-CM

## 2011-06-19 DIAGNOSIS — D696 Thrombocytopenia, unspecified: Secondary | ICD-10-CM

## 2011-06-19 DIAGNOSIS — N186 End stage renal disease: Secondary | ICD-10-CM

## 2011-06-19 LAB — CBC WITH DIFFERENTIAL/PLATELET
Basophils Absolute: 0 10*3/uL (ref 0.0–0.1)
EOS%: 1.4 % (ref 0.0–7.0)
HGB: 11.5 g/dL — ABNORMAL LOW (ref 11.6–15.9)
MCH: 30.1 pg (ref 25.1–34.0)
MCV: 94.5 fL (ref 79.5–101.0)
MONO%: 13.2 % (ref 0.0–14.0)
RDW: 16.1 % — ABNORMAL HIGH (ref 11.2–14.5)

## 2011-06-19 MED ORDER — CYANOCOBALAMIN 1000 MCG/ML IJ SOLN
1000.0000 ug | INTRAMUSCULAR | Status: DC
  Administered 2011-06-19: 1000 ug via SUBCUTANEOUS

## 2011-06-19 NOTE — Progress Notes (Signed)
[facility]  Telephone:([phone] Fax:([phone]   OFFICE PROGRESS NOTE   Cc:  BURNETT,[name], MD, MD  DIAGNOSIS:   Anemia of chronic kidney disease and vitamin B12 deficiency, thrombocytopenia from presumed immune mediated process.  COMORBIDITIES:   1. Diabetes type 2.  2. Hypertension.  3. Hyperlipidemia.  4. Osteoarthritis.  5. Chronic kidney disease with 24-hour urine collection 01/26/2008 with creatinine clearance of 38 mL/minute.  CURRENT THERAPY:  Aranesp 300 mcg subcu every 4 weeks for hemoglobin less than 11 and vitamin B12 1000 mcg subcu monthly.  [name] also on chronic low-dose prednisone for ITP.  INTERVAL HISTORY: [name] 78 y.o. female returns for regular follow up.  She reports moderate fatigue going on the last 2 years.  [name] still independent of activities of daily living.  However, she does not have the drive to do much around the house.  She has bruises in the legs but no active bleeding.  She still makes urine.  She had left arm fistula placed; however, she has not had to started HD yet.  She sees Dr. Eliott Nine.  She denies metallic taste or severe SOB or DOE, pedal edema, confusion.   Patient denies headache, visual changes, drenching night sweats, palpable lymph node swelling, mucositis, odynophagia, dysphagia, nausea vomiting, jaundice, chest pain, palpitation, productive cough, gum bleeding, epistaxis, hematemesis, hemoptysis, abdominal pain, abdominal swelling, early satiety, melena, hematochezia, hematuria, skin rash, spontaneous bleeding, joint swelling, joint pain, heat or cold intolerance, bowel bladder incontinence, back pain, focal motor weakness, paresthesia, depression, suicidal or homocidal ideation, feeling hopelessness.   Past Medical History  Diagnosis Date  . Chronic kidney disease     esrd  . Hypertension   . Anxiety and depression   . Chronic headaches   . Diverticular disease   . Anemia   . Arthritis    osteoarthritis  . Osteoporosis   . Vitamin B 12 deficiency   . History of ITP   . Anxiety     takes xanax for sleep  . Diabetes mellitus     NIDDM x 12 years  . Heart murmur     benign;asymptomatic    Past Surgical History  Procedure Date  . Cholecystectomy   . Av fistula placement 11/15/10    left upper arm AVF  . Abdominal hysterectomy 1973  . Joint replacement 2007    rt hip  . Eye surgery   . Cataract extraction w/ intraocular lens  implant, bilateral 2002  . Vascular surgery 10-2010    left AVF  . Hernia repair 2000    ventral    Current Outpatient Prescriptions  Medication Sig Dispense Refill  . acetaminophen (TYLENOL) 500 MG tablet Take 500 mg by mouth 2 (two) times daily as needed. For pain.      Marland Kitchen alprazolam (XANAX) 2 MG tablet Take 1-2 mg by mouth at bedtime as needed. For sleep.       Marland Kitchen atenolol (TENORMIN) 25 MG tablet Take 25 mg by mouth 2 (two) times daily.       . cloNIDine (CATAPRES) 0.1 MG tablet Take 0.1 mg by mouth 2 (two) times daily.        . cyanocobalamin (,VITAMIN B-12,) 1000 MCG/ML injection Inject 1,000 mcg into the muscle every 30 (thirty) days.       . darbepoetin (ARANESP) 150 MCG/0.3ML SOLN Inject 150 mcg into the skin every 30 (thirty) days.       . furosemide (LASIX) 20 MG  tablet Take 20 mg by mouth daily.       Marland Kitchen oxybutynin (DITROPAN) 5 MG tablet Take 5 mg by mouth daily.       . pioglitazone (ACTOS) 30 MG tablet Take 30 mg by mouth daily.       . predniSONE (DELTASONE) 5 MG tablet TAKE 2 TABLETS BY MOUTH EVERY MORNING  100 tablet  1  . simvastatin (ZOCOR) 40 MG tablet Take 40 mg by mouth at bedtime.         Current Facility-Administered Medications  Medication Dose Route Frequency Provider Last Rate Last Dose  . cyanocobalamin ((VITAMIN B-12)) injection 1,000 mcg  1,000 mcg Subcutaneous Q30 days Exie Parody, MD   1,000 mcg at 06/19/11 1026    ALLERGIES:   has no known allergies.  REVIEW OF SYSTEMS:  The rest of the 14-point review of  system was negative.   Filed Vitals:   06/19/11 0938  BP: 169/63  Pulse: 66  Temp: 97.5 F (36.4 C)   Wt Readings from Last 3 Encounters:  06/19/11 127 lb 6.4 oz (57.788 kg)  03/11/11 140 lb (63.504 kg)  02/20/11 141 lb 15.6 oz (64.4 kg)   ECOG Performance status:   PHYSICAL EXAMINATION:   General:  well-nourished woman, chronically tired appearing, in no acute distress.  Eyes:  no scleral icterus.  ENT:  There were no oropharyngeal lesions.  Neck was without thyromegaly.  Lymphatics:  Negative cervical, supraclavicular or axillary adenopathy.  Respiratory: lungs were clear bilaterally without wheezing or crackles.  Cardiovascular:  Regular rate and rhythm, S1/S2, without murmur, rub or gallop.  There was trace bilateral pedal edema.  GI:  abdomen was soft, flat, nontender, nondistended, without organomegaly.  Muscoloskeletal:  no spinal tenderness of palpation of vertebral spine.  Skin exam was without  petichae.  There were echymosis on the bilateral shins. Neuro exam was nonfocal.  Patient was able to get on and off exam table with minimal assistance.  Gait was normal.  Patient was alerted and oriented.  Attention was good.   Language was appropriate.  Mood was normal without depression.  Speech was not pressured.  Thought content was not tangential.      LABORATORY/RADIOLOGY DATA:  Lab Results  Component Value Date   WBC 5.3 06/19/2011   HGB 11.5* 06/19/2011   HCT 36.1 06/19/2011   PLT 90* 06/19/2011   GLUCOSE 99 02/28/2011   CHOL  Value: 182        ATP III CLASSIFICATION:  <200     mg/dL   Desirable  409-811  mg/dL   Borderline High  >=914    mg/dL   High        78/03/9560   TRIG 85 02/01/2009   HDL 106 02/01/2009   LDLCALC  Value: 59        Total Cholesterol/HDL:CHD Risk Coronary Heart Disease Risk Table                     Men   Women  1/2 Average Risk   3.4   3.3  Average Risk       5.0   4.4  2 X Average Risk   9.6   7.1  3 X Average Risk  23.4   11.0        Use the calculated  Patient Ratio above and the CHD Risk Table to determine the patient's CHD Risk.        ATP III CLASSIFICATION (LDL):  <  100     mg/dL   Optimal  161-096  mg/dL   Near or Above                    Optimal  130-159  mg/dL   Borderline  045-409  mg/dL   High  >811     mg/dL   Very High 91/05/7827   ALKPHOS 33* 01/23/2011   ALKPHOS 33* 01/23/2011   ALT 12 01/23/2011   ALT 12 01/23/2011   AST 14 01/23/2011   AST 14 01/23/2011   NA 139 02/28/2011   K 4.7 02/28/2011   CL 103 01/23/2011   CL 103 01/23/2011   CREATININE 2.19* 01/23/2011   CREATININE 2.19* 01/23/2011   BUN 47* 01/23/2011   BUN 47* 01/23/2011   CO2 29 01/23/2011   CO2 29 01/23/2011   INR 1.1 07/21/2008   HGBA1C  Value: 7.1 (NOTE) The ADA recommends the following therapeutic goal for glycemic control related to Hgb A1c measurement: Goal of therapy: <6.5 Hgb A1c  Reference: American Diabetes Association: Clinical Practice Recommendations 2010, Diabetes Care, 2010, 33: (Suppl  1).* 05/22/2009      ASSESSMENT AND PLAN:    1. History of Vit B12 deficiency.  She continues to receive monthly vitamin B12 1000 mcg IM.  2. History of renal insufficiency secondary to most likely diabetes and hypertension.  [name] following up with Dr. Eliott Nine again to see whether she will continue observation or start HD.  3. Renal insufficiency induced chronic anemia.  She has been receiving Aranesp 300 mcg subcu every 4 weeks for hemoglobin less than 11.  This has helped her to needing pRBC transfusion.  4. Thrombocytopenia from presumed ITP.  She continues on prednisone 10 mg.  Her plt is relatively stable.  She has no obvious bleeding.  5. Diabetes type 2.  [name] on pioglitazone per PCP.   6. Hyperlipidemia.  [name] on simvastatin per PCP. 7. Hypertension.  [name] on atenolol, clonidine, Lasix per PCP.  Her BP was still high today.  She said that she did not take her meds this morning.  I will defer to her PCP for titration of her BP meds.  8. Fatigue:  Most  likely due to renal disease and co-morbidities.  Her fatigue is not due to her mild anemia today.  9. Follow-up:  In about 4 months.  Monthly CBC and VitB12 injection (regardless of Hgb) and Arenesp injection (for Hgb <11).     The length of time of the face-to-face encounter was 15 minutes. More than 50% of time was spent counseling and coordination of care.

## 2011-06-19 NOTE — Telephone Encounter (Signed)
Gave pt appt calendar until March 2014 lab and aranesp, pt will see ML on August 28tth

## 2011-06-19 NOTE — Patient Instructions (Signed)
1.  Diagnosis:   - Anemia of VitB12 deficiency  - Anemia of chronic kidney disease.  2.   Your Hgb today is >11.  You do not need Aranesp today; but will give you the Vit B12 injection.  CBC    Component Value Date/Time   WBC 5.3 06/19/2011 0916   WBC 8.3 05/21/2009 0500   RBC 3.82 06/19/2011 0916   RBC 3.29* 05/21/2009 0500   HGB 11.5* 06/19/2011 0916   HGB 10.5* 02/28/2011 0737   HCT 36.1 06/19/2011 0916   HCT 31.0* 02/28/2011 0737   PLT 90* 06/19/2011 0916   PLT 98* 05/21/2009 0500   MCV 94.5 06/19/2011 0916   MCV 95.0 05/21/2009 0500   MCH 30.1 06/19/2011 0916   MCH 31.3 03/22/2010 0808   MCHC 31.9 06/19/2011 0916   MCHC 34.0 05/21/2009 0500   RDW 16.1* 06/19/2011 0916   RDW 14.1 05/21/2009 0500   LYMPHSABS 1.7 06/19/2011 0916   LYMPHSABS 1.2 05/20/2009 1254   MONOABS 0.7 06/19/2011 0916   MONOABS 0.6 05/20/2009 1254   EOSABS 0.1 06/19/2011 0916   EOSABS 0.1 05/20/2009 1254   BASOSABS 0.0 06/19/2011 0916   BASOSABS 0.0 05/20/2009 1254   3.  Follow up:  Once a month Vit B12 injection. Follow up with my nurse practitioner [name] in about 1 month Follow up with your kidney doctor as instructed.

## 2011-07-03 NOTE — Progress Notes (Signed)
Received office notes from Dr. Camille Bal @ [facility]; forwarded to Dr. Gaylyn Rong.

## 2011-07-12 ENCOUNTER — Encounter: Payer: Self-pay | Admitting: Dietician

## 2011-07-12 NOTE — Progress Notes (Signed)
Brief Out-patient Oncology Nutrition Note  Reason: Positive nutrition risk screen for unintentional weight loss and decreased appetite.   [name] is a 78 year old patient of Dr. 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 Contacted [name] via home telephone number for nutrition risk. She reported her appetite has been poor. She reported to try to eat tree small meals daily. She stated she has had a lack of energy. She reported she used to drink Ensure nutrition supplement but she stopped because she heard it was bad for diabetes. I have encouraged her to try Glucerna nutrition supplement. She stated her kidney MD told her not to have diary. She denies problems with nausea or vomiting.  Wt Readings from Last 10 Encounters:  06/19/11 127 lb 6.4 oz (57.788 kg)  03/11/11 140 lb (63.504 kg)  02/20/11 141 lb 15.6 oz (64.4 kg)  02/11/11 139 lb (63.05 kg)  01/23/11 143 lb (64.864 kg)  09/05/10 140 lb (63.504 kg)  12/31/10 138 lb (62.596 kg)  10/22/10 136 lb (61.689 kg)  05/25/08 137 lb 8 oz (62.37 kg)  * Weight down 13 lb over 4 months, 9.2% unintentional weight loss from baseline.   I have encouraged her to increase intake to 4 to 6 small meals daily and to drink at least one Glucerna nutrition supplement daily. She denies the need to receive nutrition information. She verbalized understanding about the nutrition information we discussed today and is without further nutrition related questions. I have provided RD contact information for future questions or concerns.  RD available for nutrition needs.  Iven Finn [facility] [phone]

## 2011-07-17 ENCOUNTER — Ambulatory Visit (HOSPITAL_BASED_OUTPATIENT_CLINIC_OR_DEPARTMENT_OTHER): Payer: Medicare Other

## 2011-07-17 ENCOUNTER — Other Ambulatory Visit (HOSPITAL_BASED_OUTPATIENT_CLINIC_OR_DEPARTMENT_OTHER): Payer: Medicare Other | Admitting: Lab

## 2011-07-17 VITALS — BP 152/53 | HR 55 | Temp 97.3°F

## 2011-07-17 DIAGNOSIS — N189 Chronic kidney disease, unspecified: Secondary | ICD-10-CM

## 2011-07-17 DIAGNOSIS — D518 Other vitamin B12 deficiency anemias: Secondary | ICD-10-CM

## 2011-07-17 DIAGNOSIS — N184 Chronic kidney disease, stage 4 (severe): Secondary | ICD-10-CM

## 2011-07-17 DIAGNOSIS — D631 Anemia in chronic kidney disease: Secondary | ICD-10-CM

## 2011-07-17 DIAGNOSIS — D519 Vitamin B12 deficiency anemia, unspecified: Secondary | ICD-10-CM

## 2011-07-17 DIAGNOSIS — N039 Chronic nephritic syndrome with unspecified morphologic changes: Secondary | ICD-10-CM

## 2011-07-17 LAB — CBC WITH DIFFERENTIAL/PLATELET
BASO%: 0.4 % (ref 0.0–2.0)
EOS%: 0.8 % (ref 0.0–7.0)
LYMPH%: 33.9 % (ref 14.0–49.7)
MCHC: 32.6 g/dL (ref 31.5–36.0)
MCV: 94.1 fL (ref 79.5–101.0)
MONO#: 0.5 10*3/uL (ref 0.1–0.9)
MONO%: 8.7 % (ref 0.0–14.0)
Platelets: 63 10*3/uL — ABNORMAL LOW (ref 145–400)
RBC: 3.23 10*6/uL — ABNORMAL LOW (ref 3.70–5.45)
WBC: 5.8 10*3/uL (ref 3.9–10.3)
nRBC: 0 % (ref 0–0)

## 2011-07-17 MED ORDER — DARBEPOETIN ALFA-POLYSORBATE 300 MCG/0.6ML IJ SOLN
300.0000 ug | Freq: Once | INTRAMUSCULAR | Status: AC
  Administered 2011-07-17: 300 ug via SUBCUTANEOUS
  Filled 2011-07-17: qty 1

## 2011-07-17 MED ORDER — CYANOCOBALAMIN 1000 MCG/ML IJ SOLN
1000.0000 ug | INTRAMUSCULAR | Status: DC
  Administered 2011-07-17: 1000 ug via SUBCUTANEOUS

## 2011-08-04 ENCOUNTER — Encounter (HOSPITAL_COMMUNITY): Payer: Self-pay | Admitting: Emergency Medicine

## 2011-08-04 ENCOUNTER — Emergency Department (HOSPITAL_COMMUNITY): Payer: Medicare Other

## 2011-08-04 ENCOUNTER — Emergency Department (HOSPITAL_COMMUNITY)
Admission: EM | Admit: 2011-08-04 | Discharge: 2011-08-04 | Disposition: A | Discharge status: Home / Self Care | Payer: Medicare Other | Source: Home / Self Care | Attending: Emergency Medicine | Admitting: Emergency Medicine

## 2011-08-04 DIAGNOSIS — E119 Type 2 diabetes mellitus without complications: Secondary | ICD-10-CM | POA: Insufficient documentation

## 2011-08-04 DIAGNOSIS — N200 Calculus of kidney: Secondary | ICD-10-CM | POA: Insufficient documentation

## 2011-08-04 DIAGNOSIS — K439 Ventral hernia without obstruction or gangrene: Secondary | ICD-10-CM | POA: Insufficient documentation

## 2011-08-04 DIAGNOSIS — Z96649 Presence of unspecified artificial hip joint: Secondary | ICD-10-CM | POA: Insufficient documentation

## 2011-08-04 DIAGNOSIS — R109 Unspecified abdominal pain: Secondary | ICD-10-CM | POA: Insufficient documentation

## 2011-08-04 DIAGNOSIS — I10 Essential (primary) hypertension: Secondary | ICD-10-CM | POA: Insufficient documentation

## 2011-08-04 LAB — URINALYSIS, ROUTINE W REFLEX MICROSCOPIC
Bilirubin Urine: NEGATIVE
Glucose, UA: NEGATIVE mg/dL
Hgb urine dipstick: NEGATIVE
Ketones, ur: NEGATIVE mg/dL
Nitrite: NEGATIVE
Protein, ur: NEGATIVE mg/dL
Specific Gravity, Urine: 1.014 (ref 1.005–1.030)
Urobilinogen, UA: 0.2 mg/dL (ref 0.0–1.0)
pH: 7 (ref 5.0–8.0)

## 2011-08-04 LAB — CBC
HCT: 39.7 % (ref 36.0–46.0)
Hemoglobin: 12.3 g/dL (ref 12.0–15.0)
MCH: 31 pg (ref 26.0–34.0)
MCHC: 31 g/dL (ref 30.0–36.0)
MCV: 100 fL (ref 78.0–100.0)
Platelets: 97 K/uL — ABNORMAL LOW (ref 150–400)
RBC: 3.97 MIL/uL (ref 3.87–5.11)
RDW: 16.3 % — ABNORMAL HIGH (ref 11.5–15.5)
WBC: 7.9 K/uL (ref 4.0–10.5)

## 2011-08-04 LAB — COMPREHENSIVE METABOLIC PANEL WITH GFR
ALT: 8 U/L (ref 0–35)
AST: 11 U/L (ref 0–37)
Albumin: 3.2 g/dL — ABNORMAL LOW (ref 3.5–5.2)
Alkaline Phosphatase: 39 U/L (ref 39–117)
BUN: 33 mg/dL — ABNORMAL HIGH (ref 6–23)
CO2: 30 meq/L (ref 19–32)
Calcium: 9.3 mg/dL (ref 8.4–10.5)
Chloride: 103 meq/L (ref 96–112)
Creatinine, Ser: 1.94 mg/dL — ABNORMAL HIGH (ref 0.50–1.10)
GFR calc Af Amer: 28 mL/min — ABNORMAL LOW
GFR calc non Af Amer: 24 mL/min — ABNORMAL LOW
Glucose, Bld: 111 mg/dL — ABNORMAL HIGH (ref 70–99)
Potassium: 4 meq/L (ref 3.5–5.1)
Sodium: 142 meq/L (ref 135–145)
Total Bilirubin: 0.6 mg/dL (ref 0.3–1.2)
Total Protein: 5.8 g/dL — ABNORMAL LOW (ref 6.0–8.3)

## 2011-08-04 LAB — DIFFERENTIAL
Basophils Absolute: 0 10*3/uL (ref 0.0–0.1)
Eosinophils Relative: 1 % (ref 0–5)
Lymphocytes Relative: 14 % (ref 12–46)
Lymphs Abs: 1.1 10*3/uL (ref 0.7–4.0)
Monocytes Absolute: 0.8 10*3/uL (ref 0.1–1.0)
Monocytes Relative: 10 % (ref 3–12)
Neutro Abs: 5.9 10*3/uL (ref 1.7–7.7)

## 2011-08-04 LAB — URINE MICROSCOPIC-ADD ON

## 2011-08-04 MED ORDER — IOHEXOL 300 MG/ML  SOLN: 20.0000 mL | INTRAMUSCULAR | Status: AC

## 2011-08-04 MED ORDER — HYDROMORPHONE HCL PF 1 MG/ML IJ SOLN
1.0000 mg | Freq: Once | INTRAMUSCULAR | Status: AC
  Administered 2011-08-04: 1 mg via INTRAVENOUS
  Filled 2011-08-04: qty 1

## 2011-08-04 MED ORDER — DEXTROSE 5 % IV SOLN
1.0000 g | INTRAVENOUS | Status: DC
  Administered 2011-08-04: 1 g via INTRAVENOUS
  Filled 2011-08-04: qty 10

## 2011-08-04 MED ORDER — ONDANSETRON HCL 4 MG/2ML IJ SOLN
4.0000 mg | Freq: Once | INTRAMUSCULAR | Status: AC
  Administered 2011-08-04: 4 mg via INTRAVENOUS
  Filled 2011-08-04: qty 2

## 2011-08-04 NOTE — Discharge Instructions (Signed)
You had a hernia in your upper abdomen which was causing your pain. The hernia was reduced which means that the bowel that was sticking through it has been pushed back into the abdominal cavity. As long as you're not having any more pain or nausea, there is no danger. However, if you start having pain or nausea again, return to the emergency department immediately.  Tomorrow, call the surgery office to be seen as soon as possible. You need to be evaluated regarding whether he should have surgery to fix the hernia so that this does not happen again. If there is any problem getting an appointment, ask to speak with Cyndra Numbers, who is a Engineer, civil (consulting) at the office.  Hernia A hernia occurs when an internal organ pushes out through a weak spot in the abdominal wall. Hernias most commonly occur in the groin and around the navel. Hernias often can be pushed back into place (reduced). Most hernias tend to get worse over time. Some abdominal hernias can get stuck in the opening (irreducible or incarcerated hernia) and cannot be reduced. An irreducible abdominal hernia which is tightly squeezed into the opening is at risk for impaired blood supply (strangulated hernia). A strangulated hernia is a medical emergency. Because of the risk for an irreducible or strangulated hernia, surgery may be recommended to repair a hernia. CAUSES   Heavy lifting.   Prolonged coughing.   Straining to have a bowel movement.   A cut (incision) made during an abdominal surgery.  HOME CARE INSTRUCTIONS   Bed rest is not required. You may continue your normal activities.   Avoid lifting more than 10 pounds (4.5 kg) or straining.   Cough gently. If you are a smoker it is best to stop. Even the best hernia repair can break down with the continual strain of coughing. Even if you do not have your hernia repaired, a cough will continue to aggravate the problem.   Do not wear anything tight over your hernia. Do not try to keep it in with an  outside bandage or truss. These can damage abdominal contents if they are trapped within the hernia sac.   Eat a normal diet.   Avoid constipation. Straining over long periods of time will increase hernia size and encourage breakdown of repairs. If you cannot do this with diet alone, stool softeners may be used.  SEEK IMMEDIATE MEDICAL CARE IF:   You have a fever.   You develop increasing abdominal pain.   You feel nauseous or vomit.   Your hernia is stuck outside the abdomen, looks discolored, feels hard, or is tender.   You have any changes in your bowel habits or in the hernia that are unusual for you.   You have increased pain or swelling around the hernia.   You cannot push the hernia back in place by applying gentle pressure while lying down.  MAKE SURE YOU:   Understand these instructions.   Will watch your condition.   Will get help right away if you are not doing well or get worse.  Document Released: 02/11/2005 Document Revised: 01/31/2011 Document Reviewed: 10/01/2007 [facility] Patient Information 2012 [place], [place].

## 2011-08-04 NOTE — ED Notes (Signed)
Pt c/o abdominal pain since Thursday. Pt c/o nausea. Pt reports pain feel similar to her Diverticulitis.

## 2011-08-04 NOTE — ED Provider Notes (Signed)
History     CSN: [id number]  Arrival date & time 08/04/11  7829   First MD Initiated Contact with Patient 08/04/11 [phone]      Chief Complaint  Patient presents with  . Abdominal Pain    (Consider location/radiation/quality/duration/timing/severity/associated sxs/prior treatment) Patient is a 78 y.o. female presenting with abdominal pain. The history is provided by the patient.  Abdominal Pain The primary symptoms of the illness include abdominal pain.  She has been having severe upper abdominal pain for the last 3 days. Pain is constant and sharp without radiation. It is primarily in the midline. It has been getting progressively worse and is now severe and rated at 10 over 10. There is associated nausea but no vomiting. She has had bowel movements and pain does get momentarily better after a bowel movement. She denies fever, chills, sweats. Nothing seems to make the pain worse. She has a history of diverticulitis and states that this feels similar.  Past Medical History  Diagnosis Date  . Hypertension   . Anxiety and depression   . Chronic headaches   . Diverticular disease   . Anemia   . Arthritis     osteoarthritis  . Osteoporosis   . Vitamin B 12 deficiency   . History of ITP   . Anxiety     takes xanax for sleep  . Diabetes mellitus     NIDDM x 12 years  . Heart murmur     benign;asymptomatic  . Chronic kidney disease     esrd    Past Surgical History  Procedure Date  . Cholecystectomy   . Av fistula placement 11/15/10    left upper arm AVF  . Abdominal hysterectomy 1973  . Joint replacement 2007    rt hip  . Eye surgery   . Cataract extraction w/ intraocular lens  implant, bilateral 2002  . Vascular surgery 10-2010    left AVF  . Hernia repair 2000    ventral    Family History  Problem Relation Age of Onset  . Stroke Mother   . Heart disease Father     History  Substance Use Topics  . Smoking status: Never Smoker   . Smokeless tobacco: Current  User    Types: Snuff  . Alcohol Use: No    OB History    Grav Para Term Preterm Abortions TAB SAB Ect Mult Living                  Review of Systems  Gastrointestinal: Positive for abdominal pain.  All other systems reviewed and are negative.    Allergies  Review of patient's allergies indicates no known allergies.  Home Medications   Current Outpatient Rx  Name Route Sig Dispense Refill  . ACETAMINOPHEN 500 MG PO TABS Oral Take 500 mg by mouth 2 (two) times daily as needed. For pain.    Marland Kitchen ALPRAZOLAM 2 MG PO TABS Oral Take 1-2 mg by mouth at bedtime as needed. For sleep.     . ATENOLOL 25 MG PO TABS Oral Take 25 mg by mouth 2 (two) times daily.     Marland Kitchen CLONIDINE HCL 0.1 MG PO TABS Oral Take 0.1 mg by mouth 2 (two) times daily.      . CYANOCOBALAMIN 1000 MCG/ML IJ SOLN Intramuscular Inject 1,000 mcg into the muscle every 30 (thirty) days.     Marland Kitchen DARBEPOETIN ALFA-POLYSORBATE 150 MCG/0.3ML IJ SOLN Subcutaneous Inject 150 mcg into the skin every 30 (thirty) days.     Marland Kitchen  FUROSEMIDE 20 MG PO TABS Oral Take 20 mg by mouth daily.     . OXYBUTYNIN CHLORIDE 5 MG PO TABS Oral Take 5 mg by mouth daily.     Marland Kitchen PIOGLITAZONE HCL 30 MG PO TABS Oral Take 30 mg by mouth daily.     Marland Kitchen PREDNISONE 5 MG PO TABS  TAKE 2 TABLETS BY MOUTH EVERY MORNING 100 tablet 1  . SIMVASTATIN 40 MG PO TABS Oral Take 40 mg by mouth at bedtime.        BP 171/55  Pulse 56  Temp(Src) 97.9 F (36.6 C) (Oral)  Resp 20  SpO2 99%  Physical Exam  Nursing note and vitals reviewed.  78 year old female who is resting comfortably and in no acute distress. Vital signs are significant for mild bradycardia with a heart rate of 56, moderate hypertension with blood pressure 171/55. Oxygen saturation is 99% which is normal. Head is normocephalic and atraumatic. PERRLA, EOMI. There is no scleral icterus. Mucous members are moist. Neck is nontender and supple. Back is nontender there's no CVA tenderness. Lungs are clear without rales,  wheezes, rhonchi. Heart has regular rate and rhythm with 1-2/6 systolic ejection murmur heard at the upper left sternal border. Abdomen: There is a hernia present in the midline in the epigastrium and this area is tender. Hernia is reduced with slight difficulty. Remainder of abdomen is soft and nontender. Peristalsis is decreased. The masses or are felt of the hernia and there is no hepatosplenomegaly. Extremities have no cyanosis or edema, full range of motion is present. Venous stasis changes are noted. Skin has senile purpura but no other rashes. Neurologic: Mental status is normal, cranial nerves are intact, there are no motor or sensory deficits.  ED Course  Procedures (including critical care time)  Results for orders placed during the hospital encounter of 08/04/11  CBC      Component Value Range   WBC 7.9  4.0 - 10.5 (K/uL)   RBC 3.97  3.87 - 5.11 (MIL/uL)   Hemoglobin 12.3  12.0 - 15.0 (g/dL)   HCT 81.1  91.4 - 78.2 (%)   MCV 100.0  78.0 - 100.0 (fL)   MCH 31.0  26.0 - 34.0 (pg)   MCHC 31.0  30.0 - 36.0 (g/dL)   RDW 95.6 (*) 21.3 - 15.5 (%)   Platelets 97 (*) 150 - 400 (K/uL)  DIFFERENTIAL      Component Value Range   Neutrophils Relative 75  43 - 77 (%)   Neutro Abs 5.9  1.7 - 7.7 (K/uL)   Lymphocytes Relative 14  12 - 46 (%)   Lymphs Abs 1.1  0.7 - 4.0 (K/uL)   Monocytes Relative 10  3 - 12 (%)   Monocytes Absolute 0.8  0.1 - 1.0 (K/uL)   Eosinophils Relative 1  0 - 5 (%)   Eosinophils Absolute 0.1  0.0 - 0.7 (K/uL)   Basophils Relative 0  0 - 1 (%)   Basophils Absolute 0.0  0.0 - 0.1 (K/uL)  COMPREHENSIVE METABOLIC PANEL      Component Value Range   Sodium 142  135 - 145 (mEq/L)   Potassium 4.0  3.5 - 5.1 (mEq/L)   Chloride 103  96 - 112 (mEq/L)   CO2 30  19 - 32 (mEq/L)   Glucose, Bld 111 (*) 70 - 99 (mg/dL)   BUN 33 (*) 6 - 23 (mg/dL)   Creatinine, Ser 0.86 (*) 0.50 - 1.10 (mg/dL)   Calcium 9.3  8.4 - 10.5 (mg/dL)   Total Protein 5.8 (*) 6.0 - 8.3 (g/dL)    Albumin 3.2 (*) 3.5 - 5.2 (g/dL)   AST 11  0 - 37 (U/L)   ALT 8  0 - 35 (U/L)   Alkaline Phosphatase 39  39 - 117 (U/L)   Total Bilirubin 0.6  0.3 - 1.2 (mg/dL)   GFR calc non Af Amer 24 (*) >90 (mL/min)   GFR calc Af Amer 28 (*) >90 (mL/min)  LIPASE, BLOOD      Component Value Range   Lipase 32  11 - 59 (U/L)  URINALYSIS, ROUTINE W REFLEX MICROSCOPIC      Component Value Range   Color, Urine YELLOW  YELLOW    APPearance CLOUDY (*) CLEAR    Specific Gravity, Urine 1.014  1.005 - 1.030    pH 7.0  5.0 - 8.0    Glucose, UA NEGATIVE  NEGATIVE (mg/dL)   Hgb urine dipstick NEGATIVE  NEGATIVE    Bilirubin Urine NEGATIVE  NEGATIVE    Ketones, ur NEGATIVE  NEGATIVE (mg/dL)   Protein, ur NEGATIVE  NEGATIVE (mg/dL)   Urobilinogen, UA 0.2  0.0 - 1.0 (mg/dL)   Nitrite NEGATIVE  NEGATIVE    Leukocytes, UA TRACE (*) NEGATIVE   LACTIC ACID, PLASMA      Component Value Range   Lactic Acid, Venous 1.2  0.5 - 2.2 (mmol/L)  URINE MICROSCOPIC-ADD ON      Component Value Range   Squamous Epithelial / LPF RARE  RARE    WBC, UA 3-6  <3 (WBC/hpf)   Bacteria, UA MANY (*) RARE    Ct Abdomen Pelvis Wo Contrast  08/04/2011  *RADIOLOGY REPORT*  Clinical Data: Abdominal pain  CT ABDOMEN AND PELVIS WITHOUT CONTRAST  Technique:  Multidetector CT imaging of the abdomen and pelvis was performed following the standard protocol without intravenous contrast.  Comparison: 02/01/2009  Findings: Sagittal images of the spine shows osteopenia.  Stable degenerative changes lumbar spine.  Atherosclerotic calcifications of the coronary arteries again noted.  Lung bases shows small amount of posterior basilar atelectasis.  Study is limited without IV contrast.  Atherosclerotic calcifications are noted abdominal aorta, bilateral renal artery origin, SMA origin and bilateral iliac arteries.  No aortic aneurysm.  Stable multiple anterior abdominal wall small ventral hernias containing fat without evidence of acute complication.   Again noted a small amount of perihepatic ascites.  The patient is status post cholecystectomy.  Unenhanced liver shows no biliary ductal dilatation.  Unenhanced pancreas, spleen and adrenal glands are unremarkable.  Unenhanced kidneys are symmetrical in size.  No hydronephrosis or hydroureter.  There is a nonobstructive calcified calculus in the lower pole of the right kidney measures 3.3 mm.  Distended small bowel loops are noted with air-fluid levels seen mid and lower abdomen.  The terminal small bowel is collapsed small caliber.  The findings are consistent with distal small bowel obstruction.  The transition point of obstruction is not clearly identified.  Small amount of pelvic free fluid is noted.  Some stool noted in the right and transverse colon.  Normal appendix is partially visualized in axial image 55.  Sigmoid colon diverticula are noted without evidence of acute diverticulitis.  The urinary bladder is under distended grossly unremarkable.  Evaluation of the pelvis is limited by streak artifacts from right hip prosthesis. Left renal cyst is stable from prior exam measures 2.4 cm.  IMPRESSION:  1.Distended small bowel loops are noted with air-fluid levels seen mid  and lower abdomen.  The terminal small bowel is collapsed small caliber.  The findings are consistent with distal small bowel obstruction.  The transition point of obstruction is not clearly identified.  Small amount of pelvic free fluid is noted.  2.  Stable multiple small abdominal wall ventral hernia containing fat without evidence of acute complication. 3.  Normal appendix is partially visualized. 4.  Again noted small amount of perihepatic ascites. 5.  There is a right nonobstructive nephrolithiasis.  No hydronephrosis or hydroureter.  Left renal cyst is stable from prior exam. 6.  Limited evaluation of the pelvis due to streak artifacts from right hip prosthesis.  Original Report Authenticated By: Natasha Mead, M.D.      1. Ventral  hernia       MDM  Abdominal pain which seems to be due 2 ventral hernia. Because of duration of her symptoms, lactate will be checked and she'll be sent for CT scan to make sure there is no other pathology are present. Old records are reviewed and a CT scan in 2000 and showed presence of several periumbilical hernias which did not contain any bowel loops at that time.   She remains completely pain-free in the emergency department after reduction of her hernia. CT showed possible small bowel obstruction with no clear transition point. I feel that this is because she had a transition point which was the hernia which has since been reduced. [name] reexamined and the abdomen is completely soft and nontender. Hernia is still present but easily reducible at this point and is nontender. I discussed the case with Dr. Donell Beers who is on call for [facility] [place] [facility] and with normal WBC, no vomiting, and benign abdominal exam, she feels that it is safe for the patient to followup with their office as an outpatient. Patient was advised to return if pain or nausea recur.  Dione Booze, MD 08/04/11 1410

## 2011-08-04 NOTE — ED Notes (Signed)
Pt receiving rocephin infusion, then to be dc'ed home.

## 2011-08-06 ENCOUNTER — Encounter (HOSPITAL_COMMUNITY): Payer: Self-pay | Admitting: Anesthesiology

## 2011-08-06 ENCOUNTER — Inpatient Hospital Stay (HOSPITAL_COMMUNITY): Payer: Medicare Other | Admitting: Anesthesiology

## 2011-08-06 ENCOUNTER — Inpatient Hospital Stay (HOSPITAL_COMMUNITY)
Admission: AD | Admit: 2011-08-06 | Discharge: 2011-08-26 | DRG: 353 | Disposition: A | Discharge status: Other Acute Inpatient Hospital | Payer: Medicare Other | Source: Ambulatory Visit | Attending: Internal Medicine | Admitting: Internal Medicine

## 2011-08-06 ENCOUNTER — Encounter (HOSPITAL_COMMUNITY): Payer: Self-pay | Admitting: General Practice

## 2011-08-06 ENCOUNTER — Ambulatory Visit (INDEPENDENT_AMBULATORY_CARE_PROVIDER_SITE_OTHER): Payer: Medicare Other | Admitting: General Surgery

## 2011-08-06 ENCOUNTER — Encounter (HOSPITAL_COMMUNITY)
Admission: AD | Disposition: A | Discharge status: Nursing Home (Includes ICF) | Payer: Self-pay | Source: Ambulatory Visit

## 2011-08-06 ENCOUNTER — Encounter (INDEPENDENT_AMBULATORY_CARE_PROVIDER_SITE_OTHER): Payer: Self-pay | Admitting: General Surgery

## 2011-08-06 VITALS — BP 116/62 | HR 64 | Temp 97.3°F | Resp 16 | Ht 63.5 in | Wt 132.0 lb

## 2011-08-06 DIAGNOSIS — B3749 Other urogenital candidiasis: Secondary | ICD-10-CM | POA: Diagnosis not present

## 2011-08-06 DIAGNOSIS — Z961 Presence of intraocular lens: Secondary | ICD-10-CM

## 2011-08-06 DIAGNOSIS — N289 Disorder of kidney and ureter, unspecified: Secondary | ICD-10-CM

## 2011-08-06 DIAGNOSIS — R627 Adult failure to thrive: Secondary | ICD-10-CM | POA: Diagnosis not present

## 2011-08-06 DIAGNOSIS — K567 Ileus, unspecified: Secondary | ICD-10-CM

## 2011-08-06 DIAGNOSIS — K9189 Other postprocedural complications and disorders of digestive system: Secondary | ICD-10-CM | POA: Diagnosis not present

## 2011-08-06 DIAGNOSIS — K430 Incisional hernia with obstruction, without gangrene: Principal | ICD-10-CM | POA: Diagnosis present

## 2011-08-06 DIAGNOSIS — F329 Major depressive disorder, single episode, unspecified: Secondary | ICD-10-CM

## 2011-08-06 DIAGNOSIS — D693 Immune thrombocytopenic purpura: Secondary | ICD-10-CM | POA: Diagnosis present

## 2011-08-06 DIAGNOSIS — F411 Generalized anxiety disorder: Secondary | ICD-10-CM | POA: Diagnosis present

## 2011-08-06 DIAGNOSIS — R6521 Severe sepsis with septic shock: Secondary | ICD-10-CM | POA: Diagnosis present

## 2011-08-06 DIAGNOSIS — K560 Paralytic ileus: Secondary | ICD-10-CM | POA: Diagnosis not present

## 2011-08-06 DIAGNOSIS — E876 Hypokalemia: Secondary | ICD-10-CM | POA: Diagnosis not present

## 2011-08-06 DIAGNOSIS — F3289 Other specified depressive episodes: Secondary | ICD-10-CM | POA: Diagnosis present

## 2011-08-06 DIAGNOSIS — Z9849 Cataract extraction status, unspecified eye: Secondary | ICD-10-CM

## 2011-08-06 DIAGNOSIS — M1990 Unspecified osteoarthritis, unspecified site: Secondary | ICD-10-CM | POA: Diagnosis present

## 2011-08-06 DIAGNOSIS — N186 End stage renal disease: Secondary | ICD-10-CM | POA: Diagnosis present

## 2011-08-06 DIAGNOSIS — IMO0002 Reserved for concepts with insufficient information to code with codable children: Secondary | ICD-10-CM

## 2011-08-06 DIAGNOSIS — D696 Thrombocytopenia, unspecified: Secondary | ICD-10-CM | POA: Diagnosis present

## 2011-08-06 DIAGNOSIS — K432 Incisional hernia without obstruction or gangrene: Secondary | ICD-10-CM

## 2011-08-06 DIAGNOSIS — G9349 Other encephalopathy: Secondary | ICD-10-CM | POA: Diagnosis not present

## 2011-08-06 DIAGNOSIS — G9340 Encephalopathy, unspecified: Secondary | ICD-10-CM | POA: Diagnosis present

## 2011-08-06 DIAGNOSIS — N179 Acute kidney failure, unspecified: Secondary | ICD-10-CM

## 2011-08-06 DIAGNOSIS — Z79899 Other long term (current) drug therapy: Secondary | ICD-10-CM

## 2011-08-06 DIAGNOSIS — K5730 Diverticulosis of large intestine without perforation or abscess without bleeding: Secondary | ICD-10-CM

## 2011-08-06 DIAGNOSIS — R569 Unspecified convulsions: Secondary | ICD-10-CM | POA: Diagnosis not present

## 2011-08-06 DIAGNOSIS — E870 Hyperosmolality and hypernatremia: Secondary | ICD-10-CM | POA: Diagnosis not present

## 2011-08-06 DIAGNOSIS — I120 Hypertensive chronic kidney disease with stage 5 chronic kidney disease or end stage renal disease: Secondary | ICD-10-CM | POA: Diagnosis present

## 2011-08-06 DIAGNOSIS — N12 Tubulo-interstitial nephritis, not specified as acute or chronic: Secondary | ICD-10-CM | POA: Diagnosis present

## 2011-08-06 DIAGNOSIS — R110 Nausea: Secondary | ICD-10-CM

## 2011-08-06 DIAGNOSIS — J95821 Acute postprocedural respiratory failure: Secondary | ICD-10-CM | POA: Diagnosis not present

## 2011-08-06 DIAGNOSIS — Z9889 Other specified postprocedural states: Secondary | ICD-10-CM

## 2011-08-06 DIAGNOSIS — E538 Deficiency of other specified B group vitamins: Secondary | ICD-10-CM | POA: Diagnosis present

## 2011-08-06 DIAGNOSIS — Z96649 Presence of unspecified artificial hip joint: Secondary | ICD-10-CM

## 2011-08-06 DIAGNOSIS — Z862 Personal history of diseases of the blood and blood-forming organs and certain disorders involving the immune mechanism: Secondary | ICD-10-CM

## 2011-08-06 DIAGNOSIS — E875 Hyperkalemia: Secondary | ICD-10-CM | POA: Diagnosis present

## 2011-08-06 DIAGNOSIS — D649 Anemia, unspecified: Secondary | ICD-10-CM | POA: Diagnosis present

## 2011-08-06 DIAGNOSIS — A419 Sepsis, unspecified organism: Secondary | ICD-10-CM | POA: Diagnosis present

## 2011-08-06 DIAGNOSIS — I959 Hypotension, unspecified: Secondary | ICD-10-CM | POA: Diagnosis not present

## 2011-08-06 DIAGNOSIS — G40309 Generalized idiopathic epilepsy and epileptic syndromes, not intractable, without status epilepticus: Secondary | ICD-10-CM | POA: Diagnosis not present

## 2011-08-06 DIAGNOSIS — A498 Other bacterial infections of unspecified site: Secondary | ICD-10-CM | POA: Diagnosis present

## 2011-08-06 DIAGNOSIS — D631 Anemia in chronic kidney disease: Secondary | ICD-10-CM | POA: Diagnosis present

## 2011-08-06 DIAGNOSIS — L259 Unspecified contact dermatitis, unspecified cause: Secondary | ICD-10-CM | POA: Diagnosis not present

## 2011-08-06 DIAGNOSIS — E1129 Type 2 diabetes mellitus with other diabetic kidney complication: Secondary | ICD-10-CM | POA: Diagnosis present

## 2011-08-06 DIAGNOSIS — N189 Chronic kidney disease, unspecified: Secondary | ICD-10-CM | POA: Diagnosis present

## 2011-08-06 DIAGNOSIS — M810 Age-related osteoporosis without current pathological fracture: Secondary | ICD-10-CM | POA: Diagnosis present

## 2011-08-06 DIAGNOSIS — D519 Vitamin B12 deficiency anemia, unspecified: Secondary | ICD-10-CM

## 2011-08-06 DIAGNOSIS — N170 Acute kidney failure with tubular necrosis: Secondary | ICD-10-CM | POA: Diagnosis not present

## 2011-08-06 DIAGNOSIS — J9600 Acute respiratory failure, unspecified whether with hypoxia or hypercapnia: Secondary | ICD-10-CM | POA: Diagnosis not present

## 2011-08-06 HISTORY — PX: VENTRAL HERNIA REPAIR: SHX424

## 2011-08-06 LAB — CBC
MCH: 30.6 pg (ref 26.0–34.0)
MCHC: 30.9 g/dL (ref 30.0–36.0)
MCV: 99 fL (ref 78.0–100.0)
Platelets: 96 10*3/uL — ABNORMAL LOW (ref 150–400)
RBC: 3.95 MIL/uL (ref 3.87–5.11)
RDW: 15.9 % — ABNORMAL HIGH (ref 11.5–15.5)

## 2011-08-06 LAB — GLUCOSE, CAPILLARY
Glucose-Capillary: 106 mg/dL — ABNORMAL HIGH (ref 70–99)
Glucose-Capillary: 131 mg/dL — ABNORMAL HIGH (ref 70–99)
Glucose-Capillary: 67 mg/dL — ABNORMAL LOW (ref 70–99)
Glucose-Capillary: 92 mg/dL (ref 70–99)

## 2011-08-06 LAB — URINE CULTURE
Colony Count: >=100000
Culture  Setup Time: 201306100650

## 2011-08-06 LAB — BASIC METABOLIC PANEL
BUN: 38 mg/dL — ABNORMAL HIGH (ref 6–23)
CO2: 27 mEq/L (ref 19–32)
Calcium: 9.4 mg/dL (ref 8.4–10.5)
Creatinine, Ser: 2.23 mg/dL — ABNORMAL HIGH (ref 0.50–1.10)
Glucose, Bld: 76 mg/dL (ref 70–99)

## 2011-08-06 LAB — SURGICAL PCR SCREEN: Staphylococcus aureus: NEGATIVE

## 2011-08-06 SURGERY — REPAIR, HERNIA, VENTRAL
Anesthesia: General | Site: Abdomen | Wound class: Clean

## 2011-08-06 MED ORDER — ONDANSETRON HCL 4 MG/2ML IJ SOLN
INTRAMUSCULAR | Status: DC | PRN
  Administered 2011-08-06: 4 mg via INTRAVENOUS

## 2011-08-06 MED ORDER — LORAZEPAM 2 MG/ML IJ SOLN
0.5000 mg | Freq: Three times a day (TID) | INTRAMUSCULAR | Status: DC | PRN
  Administered 2011-08-10 (×2): 0.5 mg via INTRAVENOUS
  Filled 2011-08-06 (×3): qty 1

## 2011-08-06 MED ORDER — EPHEDRINE SULFATE 50 MG/ML IJ SOLN
INTRAMUSCULAR | Status: DC | PRN
  Administered 2011-08-06: 5 mg via INTRAVENOUS
  Administered 2011-08-06 (×2): 10 mg via INTRAVENOUS
  Administered 2011-08-06 (×2): 5 mg via INTRAVENOUS

## 2011-08-06 MED ORDER — LIDOCAINE HCL (CARDIAC) 20 MG/ML IV SOLN
INTRAVENOUS | Status: DC | PRN
  Administered 2011-08-06: 100 mg via INTRAVENOUS

## 2011-08-06 MED ORDER — SODIUM CHLORIDE 0.9 % IV SOLN
INTRAVENOUS | Status: DC
  Administered 2011-08-06: 12:00:00 via INTRAVENOUS

## 2011-08-06 MED ORDER — BUPIVACAINE-EPINEPHRINE 0.25% -1:200000 IJ SOLN
INTRAMUSCULAR | Status: DC | PRN
  Administered 2011-08-06: 21 mL

## 2011-08-06 MED ORDER — METOPROLOL TARTRATE 1 MG/ML IV SOLN
5.0000 mg | Freq: Four times a day (QID) | INTRAVENOUS | Status: DC
  Administered 2011-08-06 – 2011-08-10 (×15): 5 mg via INTRAVENOUS
  Filled 2011-08-06 (×19): qty 5

## 2011-08-06 MED ORDER — FUROSEMIDE 10 MG/ML IJ SOLN
20.0000 mg | Freq: Every day | INTRAMUSCULAR | Status: DC
  Administered 2011-08-06 – 2011-08-11 (×6): 20 mg via INTRAVENOUS
  Filled 2011-08-06 (×6): qty 2

## 2011-08-06 MED ORDER — CHLORHEXIDINE GLUCONATE 0.12 % MT SOLN
15.0000 mL | Freq: Two times a day (BID) | OROMUCOSAL | Status: DC
  Administered 2011-08-06 – 2011-08-17 (×24): 15 mL via OROMUCOSAL
  Filled 2011-08-06 (×26): qty 15

## 2011-08-06 MED ORDER — PROPOFOL 10 MG/ML IV EMUL
INTRAVENOUS | Status: DC | PRN
  Administered 2011-08-06: 70 mg via INTRAVENOUS

## 2011-08-06 MED ORDER — BIOTENE DRY MOUTH MT LIQD
15.0000 mL | Freq: Two times a day (BID) | OROMUCOSAL | Status: DC
  Administered 2011-08-07 – 2011-08-17 (×21): 15 mL via OROMUCOSAL

## 2011-08-06 MED ORDER — CEFAZOLIN SODIUM 1-5 GM-% IV SOLN
INTRAVENOUS | Status: DC | PRN
  Administered 2011-08-06: 1 g via INTRAVENOUS

## 2011-08-06 MED ORDER — LORAZEPAM BOLUS VIA INFUSION
0.5000 mg | Freq: Three times a day (TID) | INTRAVENOUS | Status: DC | PRN
  Filled 2011-08-06: qty 1

## 2011-08-06 MED ORDER — ONDANSETRON HCL 4 MG/2ML IJ SOLN: 4.0000 mg | Freq: Four times a day (QID) | INTRAMUSCULAR | Status: DC | PRN

## 2011-08-06 MED ORDER — SODIUM CHLORIDE 0.9 % IV SOLN
INTRAVENOUS | Status: DC | PRN
  Administered 2011-08-06: 15:00:00 via INTRAVENOUS

## 2011-08-06 MED ORDER — PANTOPRAZOLE SODIUM 40 MG IV SOLR
40.0000 mg | Freq: Every day | INTRAVENOUS | Status: DC
  Administered 2011-08-06 – 2011-08-16 (×11): 40 mg via INTRAVENOUS
  Filled 2011-08-06 (×13): qty 40

## 2011-08-06 MED ORDER — NEOSTIGMINE METHYLSULFATE 1 MG/ML IJ SOLN
INTRAMUSCULAR | Status: DC | PRN
  Administered 2011-08-06: 3 mg via INTRAVENOUS

## 2011-08-06 MED ORDER — SODIUM CHLORIDE 0.9 % IV SOLN: INTRAVENOUS | Status: DC

## 2011-08-06 MED ORDER — 0.9 % SODIUM CHLORIDE (POUR BTL) OPTIME
TOPICAL | Status: DC | PRN
  Administered 2011-08-06: 200 mL

## 2011-08-06 MED ORDER — ROCURONIUM BROMIDE 100 MG/10ML IV SOLN
INTRAVENOUS | Status: DC | PRN
  Administered 2011-08-06: 5 mg via INTRAVENOUS
  Administered 2011-08-06: 20 mg via INTRAVENOUS
  Administered 2011-08-06: 10 mg via INTRAVENOUS

## 2011-08-06 MED ORDER — LACTATED RINGERS IV SOLN: INTRAVENOUS | Status: DC

## 2011-08-06 MED ORDER — CEFAZOLIN SODIUM 1-5 GM-% IV SOLN
1.0000 g | INTRAVENOUS | Status: DC
  Filled 2011-08-06: qty 50

## 2011-08-06 MED ORDER — METOPROLOL TARTRATE 1 MG/ML IV SOLN
5.0000 mg | Freq: Four times a day (QID) | INTRAVENOUS | Status: DC
  Filled 2011-08-06 (×4): qty 5

## 2011-08-06 MED ORDER — HYDROMORPHONE HCL PF 1 MG/ML IJ SOLN
0.2500 mg | INTRAMUSCULAR | Status: DC | PRN
  Administered 2011-08-06 (×2): 0.5 mg via INTRAVENOUS

## 2011-08-06 MED ORDER — INSULIN ASPART 100 UNIT/ML ~~LOC~~ SOLN
0.0000 [IU] | SUBCUTANEOUS | Status: DC
  Administered 2011-08-07 (×2): 2 [IU] via SUBCUTANEOUS
  Administered 2011-08-08: 1 [IU] via SUBCUTANEOUS
  Administered 2011-08-08 (×3): 2 [IU] via SUBCUTANEOUS
  Administered 2011-08-08 – 2011-08-09 (×2): 1 [IU] via SUBCUTANEOUS
  Administered 2011-08-09 (×3): 2 [IU] via SUBCUTANEOUS
  Administered 2011-08-09: 1 [IU] via SUBCUTANEOUS
  Administered 2011-08-09: 2 [IU] via SUBCUTANEOUS
  Administered 2011-08-10: 1 [IU] via SUBCUTANEOUS
  Administered 2011-08-10: 18:00:00 via SUBCUTANEOUS
  Administered 2011-08-10 (×2): 1 [IU] via SUBCUTANEOUS
  Administered 2011-08-11: 2 [IU] via SUBCUTANEOUS
  Administered 2011-08-11: 1 [IU] via SUBCUTANEOUS
  Administered 2011-08-11: 09:00:00 via SUBCUTANEOUS
  Administered 2011-08-11 – 2011-08-12 (×2): 1 [IU] via SUBCUTANEOUS
  Administered 2011-08-12: 2 [IU] via SUBCUTANEOUS
  Administered 2011-08-12 (×3): 1 [IU] via SUBCUTANEOUS
  Administered 2011-08-13: 2 [IU] via SUBCUTANEOUS
  Administered 2011-08-13 (×3): 1 [IU] via SUBCUTANEOUS
  Administered 2011-08-13 – 2011-08-14 (×2): 2 [IU] via SUBCUTANEOUS
  Administered 2011-08-14: 1 [IU] via SUBCUTANEOUS
  Administered 2011-08-14: 2 [IU] via SUBCUTANEOUS
  Administered 2011-08-14: 1 [IU] via SUBCUTANEOUS
  Administered 2011-08-14: 2 [IU] via SUBCUTANEOUS
  Administered 2011-08-15: 5 [IU] via SUBCUTANEOUS
  Administered 2011-08-15 (×2): 2 [IU] via SUBCUTANEOUS
  Administered 2011-08-15: 5 [IU] via SUBCUTANEOUS
  Administered 2011-08-16: 2 [IU] via SUBCUTANEOUS
  Administered 2011-08-16: 3 [IU] via SUBCUTANEOUS
  Administered 2011-08-16: 2 [IU] via SUBCUTANEOUS
  Administered 2011-08-16: 3 [IU] via SUBCUTANEOUS
  Administered 2011-08-17: 2 [IU] via SUBCUTANEOUS
  Administered 2011-08-17: 1 [IU] via SUBCUTANEOUS
  Administered 2011-08-17: 2 [IU] via SUBCUTANEOUS
  Administered 2011-08-18 (×3): 1 [IU] via SUBCUTANEOUS

## 2011-08-06 MED ORDER — HYDROCORTISONE NICU INJ SYRINGE 50 MG/ML: 100.0000 mg | Freq: Three times a day (TID) | INTRAVENOUS | Status: DC

## 2011-08-06 MED ORDER — HYDROCORTISONE SOD SUCCINATE 100 MG IJ SOLR
100.0000 mg | Freq: Three times a day (TID) | INTRAMUSCULAR | Status: DC
  Administered 2011-08-06 – 2011-08-12 (×19): 100 mg via INTRAVENOUS
  Filled 2011-08-06 (×20): qty 2

## 2011-08-06 MED ORDER — GLYCOPYRROLATE 0.2 MG/ML IJ SOLN
INTRAMUSCULAR | Status: DC | PRN
  Administered 2011-08-06: .4 mg via INTRAVENOUS

## 2011-08-06 MED ORDER — FENTANYL CITRATE 0.05 MG/ML IJ SOLN
INTRAMUSCULAR | Status: DC | PRN
  Administered 2011-08-06 (×2): 50 ug via INTRAVENOUS
  Administered 2011-08-06: 100 ug via INTRAVENOUS

## 2011-08-06 MED ORDER — METOPROLOL TARTRATE 1 MG/ML IV SOLN
5.0000 mg | Freq: Once | INTRAVENOUS | Status: DC
  Administered 2011-08-06: 5 mg via INTRAVENOUS
  Filled 2011-08-06: qty 5

## 2011-08-06 MED ORDER — ONDANSETRON HCL 4 MG/2ML IJ SOLN: 4.0000 mg | Freq: Once | INTRAMUSCULAR | Status: DC | PRN

## 2011-08-06 MED ORDER — MORPHINE SULFATE 2 MG/ML IJ SOLN
2.0000 mg | INTRAMUSCULAR | Status: DC | PRN
  Administered 2011-08-06 – 2011-08-10 (×6): 2 mg via INTRAVENOUS
  Filled 2011-08-06 (×6): qty 1

## 2011-08-06 SURGICAL SUPPLY — 48 items
ADH SKN CLS APL DERMABOND .7 (GAUZE/BANDAGES/DRESSINGS) ×1
BLADE SURG ROTATE 9660 (MISCELLANEOUS) IMPLANT
CANISTER SUCTION 2500CC (MISCELLANEOUS) ×2 IMPLANT
CHLORAPREP W/TINT 26ML (MISCELLANEOUS) ×2 IMPLANT
CLOTH BEACON ORANGE TIMEOUT ST (SAFETY) ×2 IMPLANT
COVER SURGICAL LIGHT HANDLE (MISCELLANEOUS) ×2 IMPLANT
DERMABOND ADVANCED (GAUZE/BANDAGES/DRESSINGS) ×1
DERMABOND ADVANCED .7 DNX12 (GAUZE/BANDAGES/DRESSINGS) IMPLANT
DRAPE LAPAROSCOPIC ABDOMINAL (DRAPES) ×2 IMPLANT
DRAPE UTILITY 15X26 W/TAPE STR (DRAPE) ×4 IMPLANT
ELECT CAUTERY BLADE 6.4 (BLADE) ×2 IMPLANT
ELECT REM PT RETURN 9FT ADLT (ELECTROSURGICAL) ×2
ELECTRODE REM PT RTRN 9FT ADLT (ELECTROSURGICAL) ×1 IMPLANT
GLOVE BIO SURGEON STRL SZ 6.5 (GLOVE) ×1 IMPLANT
GLOVE BIO SURGEON STRL SZ7 (GLOVE) ×1 IMPLANT
GLOVE BIO SURGEON STRL SZ8 (GLOVE) ×2 IMPLANT
GLOVE BIOGEL PI IND STRL 7.0 (GLOVE) IMPLANT
GLOVE BIOGEL PI IND STRL 8 (GLOVE) ×1 IMPLANT
GLOVE BIOGEL PI INDICATOR 7.0 (GLOVE) ×2
GLOVE BIOGEL PI INDICATOR 8 (GLOVE) ×1
GLOVE SURG SS PI 7.0 STRL IVOR (GLOVE) ×1 IMPLANT
GOWN PREVENTION PLUS XLARGE (GOWN DISPOSABLE) ×2 IMPLANT
GOWN STRL NON-REIN LRG LVL3 (GOWN DISPOSABLE) ×3 IMPLANT
KIT BASIN OR (CUSTOM PROCEDURE TRAY) ×2 IMPLANT
KIT ROOM TURNOVER OR (KITS) ×2 IMPLANT
MARKER SKIN DUAL TIP RULER LAB (MISCELLANEOUS) ×1 IMPLANT
MESH PHYSIO OVAL 10X15CM (Mesh General) ×1 IMPLANT
NDL HYPO 25GX1X1/2 BEV (NEEDLE) ×1 IMPLANT
NEEDLE HYPO 25GX1X1/2 BEV (NEEDLE) ×2 IMPLANT
NS IRRIG 1000ML POUR BTL (IV SOLUTION) ×2 IMPLANT
PACK GENERAL/GYN (CUSTOM PROCEDURE TRAY) ×2 IMPLANT
PAD ARMBOARD 7.5X6 YLW CONV (MISCELLANEOUS) ×3 IMPLANT
SPONGE GAUZE 4X4 12PLY (GAUZE/BANDAGES/DRESSINGS) ×1 IMPLANT
STAPLER VISISTAT 35W (STAPLE) IMPLANT
SUT MNCRL AB 4-0 PS2 18 (SUTURE) ×2 IMPLANT
SUT NOVA 1 T20/GS 25DT (SUTURE) ×3 IMPLANT
SUT PROLENE 0 CT 2 (SUTURE) ×2 IMPLANT
SUT SILK 2 0 (SUTURE) ×2
SUT SILK 2-0 18XBRD TIE 12 (SUTURE) IMPLANT
SUT VIC AB 2-0 SH 18 (SUTURE) ×1 IMPLANT
SUT VIC AB 3-0 SH 27 (SUTURE) ×2
SUT VIC AB 3-0 SH 27X BRD (SUTURE) ×1 IMPLANT
SYR CONTROL 10ML LL (SYRINGE) ×2 IMPLANT
TAPE CLOTH SOFT 2X10 (GAUZE/BANDAGES/DRESSINGS) ×1 IMPLANT
TOWEL OR 17X24 6PK STRL BLUE (TOWEL DISPOSABLE) ×2 IMPLANT
TOWEL OR 17X26 10 PK STRL BLUE (TOWEL DISPOSABLE) ×2 IMPLANT
TRAY FOLEY CATH 14FRSI W/METER (CATHETERS) IMPLANT
WATER STERILE IRR 1000ML POUR (IV SOLUTION) IMPLANT

## 2011-08-06 NOTE — H&P (View-Only) (Signed)
Patient ID: [name], female   DOB: [birth year], 78 y.o.   MRN: [id number]  Chief Complaint  Patient presents with  . New Evaluation    New Pt. Eval Ventral Hernia    HPI [name] is a 78 y.o. female.  Referral from ER Dr. Preston Fleeting HPI 10 yof who has prior history of cholecystectomy, tah, and ventral hernia repair who since Thursday night has had increasing abdominal pain.  She has nausea no emesis with this.  She had a small bm yesterday.  She has significantly decreased flatus and last passed flatus on Saturday.  She was seen in er this weekend and underwent a ct scan that showed a bowel obstruction.  She was made pain free but this was with medication per her report.  She felt bad immediately again after that.  She was given an appt with [place] to follow up and today she reports signficant abdominal pain again.  Past Medical History  Diagnosis Date  . Hypertension   . Anxiety and depression   . Chronic headaches   . Diverticular disease   . Anemia   . Arthritis     osteoarthritis  . Osteoporosis   . Vitamin B 12 deficiency   . History of ITP   . Anxiety     takes xanax for sleep  . Diabetes mellitus     NIDDM x 12 years  . Heart murmur     benign;asymptomatic  . Chronic kidney disease     esrd    Past Surgical History  Procedure Date  . Cholecystectomy   . Av fistula placement 11/15/10    left upper arm AVF  . Abdominal hysterectomy 1973  . Joint replacement 2007    rt hip  . Eye surgery   . Cataract extraction w/ intraocular lens  implant, bilateral 2002  . Vascular surgery 10-2010    left AVF  . Hernia repair 2000    ventral    Family History  Problem Relation Age of Onset  . Stroke Mother   . Heart disease Father     Social History History  Substance Use Topics  . Smoking status: Never Smoker   . Smokeless tobacco: Current User    Types: Snuff  . Alcohol Use: No    No Known Allergies  Current Outpatient Prescriptions  Medication Sig Dispense  Refill  . acetaminophen (TYLENOL) 500 MG tablet Take 500 mg by mouth 2 (two) times daily as needed. For pain.      Marland Kitchen alprazolam (XANAX) 2 MG tablet Take 1-2 mg by mouth at bedtime as needed. For sleep.       Marland Kitchen atenolol (TENORMIN) 25 MG tablet Take 25 mg by mouth 2 (two) times daily.       . cloNIDine (CATAPRES) 0.1 MG tablet Take 0.1 mg by mouth 2 (two) times daily.        . cyanocobalamin (,VITAMIN B-12,) 1000 MCG/ML injection Inject 1,000 mcg into the muscle every 30 (thirty) days.       . darbepoetin (ARANESP) 150 MCG/0.3ML SOLN Inject 150 mcg into the skin every 30 (thirty) days.       . furosemide (LASIX) 20 MG tablet Take 20 mg by mouth daily.       Marland Kitchen oxybutynin (DITROPAN) 5 MG tablet Take 5 mg by mouth daily.       . pioglitazone (ACTOS) 30 MG tablet Take 30 mg by mouth daily.       . predniSONE (DELTASONE)  5 MG tablet TAKE 2 TABLETS BY MOUTH EVERY MORNING  100 tablet  1  . simvastatin (ZOCOR) 40 MG tablet Take 40 mg by mouth at bedtime.          Review of Systems Review of Systems  Blood pressure 116/62, pulse 64, temperature 97.3 F (36.3 C), temperature source Temporal, resp. rate 16, height 5' 3.5" (1.613 m), weight 132 lb (59.875 kg).  Physical Exam Physical Exam  Vitals reviewed. Constitutional: She appears distressed.  Cardiovascular: Normal rate, regular rhythm and normal heart sounds.   Pulmonary/Chest: Effort normal and breath sounds normal. She has no wheezes. She has no rales.  Abdominal: She exhibits distension. Bowel sounds are decreased. There is tenderness in the periumbilical area. A hernia is present.      Data Reviewed CT ABDOMEN AND PELVIS WITHOUT CONTRAST  Technique: Multidetector CT imaging of the abdomen and pelvis was  performed following the standard protocol without intravenous  contrast.  Comparison: 02/01/2009  Findings: Sagittal images of the spine shows osteopenia. Stable  degenerative changes lumbar spine. Atherosclerotic calcifications  of  the coronary arteries again noted. Lung bases shows small  amount of posterior basilar atelectasis.  Study is limited without IV contrast. Atherosclerotic  calcifications are noted abdominal aorta, bilateral renal artery  origin, SMA origin and bilateral iliac arteries. No aortic  aneurysm. Stable multiple anterior abdominal wall small ventral  hernias containing fat without evidence of acute complication.  Again noted a small amount of perihepatic ascites. The patient is  status post cholecystectomy.  Unenhanced liver shows no biliary ductal dilatation. Unenhanced  pancreas, spleen and adrenal glands are unremarkable.  Unenhanced kidneys are symmetrical in size. No hydronephrosis or  hydroureter. There is a nonobstructive calcified calculus in the  lower pole of the right kidney measures 3.3 mm.  Distended small bowel loops are noted with air-fluid levels seen  mid and lower abdomen. The terminal small bowel is collapsed small  caliber. The findings are consistent with distal small bowel  obstruction. The transition point of obstruction is not clearly  identified. Small amount of pelvic free fluid is noted. Some  stool noted in the right and transverse colon. Normal appendix is  partially visualized in axial image 55.  Sigmoid colon diverticula are noted without evidence of acute  diverticulitis. The urinary bladder is under distended grossly  unremarkable. Evaluation of the pelvis is limited by streak  artifacts from right hip prosthesis. Left renal cyst is stable from  prior exam measures 2.4 cm.  IMPRESSION:  1.Distended small bowel loops are noted with air-fluid levels seen  mid and lower abdomen. The terminal small bowel is collapsed small  caliber. The findings are consistent with distal small bowel  obstruction. The transition point of obstruction is not clearly  identified. Small amount of pelvic free fluid is noted.  2. Stable multiple small abdominal wall ventral hernia  containing  fat without evidence of acute complication.  3. Normal appendix is partially visualized.  4. Again noted small amount of perihepatic ascites.  5. There is a right nonobstructive nephrolithiasis. No  hydronephrosis or hydroureter. Left renal cyst is stable from  prior exam.  6. Limited evaluation of the pelvis due to streak artifacts from  right hip prosthesis.   Assessment    Incarcerated ventral hernia with bowel obstruction    Plan    She has a fair amount of abdominal pain on her exam that is concerning. This is especially true given her use of steroids. She  also at least as a partial bowel obstruction associated with this clinically as well as by CT scan obtained 2 days ago. I recommended to her that she needs to be admitted and have a nasogastric tube placed. I also think she needs to go to  the operating room for repair this hernia which I think is likely causing her bowel obstruction.  I've discussed this with my partner Dr. Janee Morn he is going to see her when she gets in the hospital.       [facility] 08/06/2011, 9:07 AM

## 2011-08-06 NOTE — Consult Note (Signed)
Reason for Consult: Assistance with management of chronic kidney disease-fluid/electrolyte management Referring Physician: Dr. Sena Slate  [name] is an 78 y.o. female.  HPI:  [name] is a 78 year old Caucasian woman with past medical history significant for chronic kidney disease stage IV likely from underlying hypertension and type 2 diabetes mellitus. She also has idiopathic thrombocytopenic purpera on chronic corticosteroid therapy and history of osteoarthritis/osteoporosis. Her baseline creatinine seems to range between 1.8- 2.0 with an estimated GFR of 22-27 mL per minute and is status post left upper arm arteriovenous fistula placement back in September of 2012 and superficialization in January 2013. She was admitted today after she presented with intractable abdominal pain that started 5 days ago to the surgeon's office and by CT scan (noncontrast) and physical exam was found to have an incarcerated ventral hernia with small bowel obstruction. Surgery is planned for later this afternoon to manage this condition. Prior to admission, she reports that she had poor appetite and poor oral intake for the last 3-4 days and had not been consistently taking her diuretics, hypoglycemic agents or antihypertensive medications. Currently denies any chest pain, shortness of breath on exertion or shortness of breath when laying flat. Has abdominal pain with associated nausea and is status post nasogastric tube placement for gastric decompression.  Past Medical History  Diagnosis Date  . Hypertension   . Anxiety and depression   . Chronic headaches   . Diverticular disease   . Anemia   . Arthritis     osteoarthritis  . Osteoporosis   . Vitamin B 12 deficiency   . History of ITP   . Anxiety     takes xanax for sleep  . Diabetes mellitus     NIDDM x 12 years  . Heart murmur     benign;asymptomatic  . Chronic kidney disease     esrd    Past Surgical History  Procedure Date  .  Cholecystectomy   . Av fistula placement 11/15/10    left upper arm AVF  . Abdominal hysterectomy 1973  . Joint replacement 2007    rt hip  . Eye surgery   . Cataract extraction w/ intraocular lens  implant, bilateral 2002  . Vascular surgery 10-2010    left AVF  . Hernia repair 2000    ventral    Family History  Problem Relation Age of Onset  . Stroke Mother   . Heart disease Father     Social History:  reports that she has never smoked. Her smokeless tobacco use includes Snuff. She reports that she does not drink alcohol or use illicit drugs.  Allergies: No Known Allergies  Medications:  Prior to Admission:  Prescriptions prior to admission  Medication Sig Dispense Refill  . acetaminophen (TYLENOL) 500 MG tablet Take 500 mg by mouth 2 (two) times daily as needed. For pain.      Marland Kitchen alprazolam (XANAX) 2 MG tablet Take 1-2 mg by mouth at bedtime as needed. For sleep.       Marland Kitchen atenolol (TENORMIN) 25 MG tablet Take 25 mg by mouth 2 (two) times daily.       . cloNIDine (CATAPRES) 0.1 MG tablet Take 0.1 mg by mouth 2 (two) times daily.        . cyanocobalamin (,VITAMIN B-12,) 1000 MCG/ML injection Inject 1,000 mcg into the muscle every 30 (thirty) days.       . darbepoetin (ARANESP) 150 MCG/0.3ML SOLN Inject 150 mcg into the skin every 30 (thirty)  days. As needed. Only receives if blood test is low      . furosemide (LASIX) 20 MG tablet Take 20 mg by mouth daily.       Marland Kitchen oxybutynin (DITROPAN) 5 MG tablet Take 5 mg by mouth daily.       . pioglitazone (ACTOS) 30 MG tablet Take 30 mg by mouth daily.       . predniSONE (DELTASONE) 5 MG tablet TAKE 2 TABLETS BY MOUTH EVERY MORNING  100 tablet  1  . simvastatin (ZOCOR) 40 MG tablet Take 40 mg by mouth at bedtime.          Results for orders placed during the hospital encounter of 08/06/11 (from the past 48 hour(s))  BASIC METABOLIC PANEL     Status: Abnormal   Collection Time   08/06/11 10:29 AM      Component Value Range Comment    Sodium 135  135 - 145 (mEq/L)    Potassium 4.6  3.5 - 5.1 (mEq/L)    Chloride 97  96 - 112 (mEq/L)    CO2 27  19 - 32 (mEq/L)    Glucose, Bld 76  70 - 99 (mg/dL)    BUN 38 (*) 6 - 23 (mg/dL)    Creatinine, Ser 1.61 (*) 0.50 - 1.10 (mg/dL)    Calcium 9.4  8.4 - 10.5 (mg/dL)    GFR calc non Af Amer 20 (*) >90 (mL/min)    GFR calc Af Amer 23 (*) >90 (mL/min)   CBC     Status: Abnormal   Collection Time   08/06/11 10:29 AM      Component Value Range Comment   WBC 7.7  4.0 - 10.5 (K/uL)    RBC 3.95  3.87 - 5.11 (MIL/uL)    Hemoglobin 12.1  12.0 - 15.0 (g/dL)    HCT 09.6  04.5 - 40.9 (%)    MCV 99.0  78.0 - 100.0 (fL)    MCH 30.6  26.0 - 34.0 (pg)    MCHC 30.9  30.0 - 36.0 (g/dL)    RDW 81.1 (*) 91.4 - 15.5 (%)    Platelets 96 (*) 150 - 400 (K/uL) CONSISTENT WITH PREVIOUS RESULT  GLUCOSE, CAPILLARY     Status: Normal   Collection Time   08/06/11 12:10 PM      Component Value Range Comment   Glucose-Capillary 84  70 - 99 (mg/dL)    Comment 1 Notify RN       No results found.  Review of Systems  Constitutional: Positive for chills and malaise/fatigue. Negative for fever, weight loss and diaphoresis.  HENT: Negative.   Eyes: Negative.   Respiratory: Negative.   Cardiovascular: Negative.   Gastrointestinal: Positive for nausea, vomiting and abdominal pain. Negative for heartburn, diarrhea, constipation, blood in stool and melena.  Genitourinary: Negative.   Musculoskeletal: Negative.   Skin: Negative.   Neurological: Positive for dizziness and weakness. Negative for tingling, tremors, sensory change, speech change, focal weakness, seizures and loss of consciousness.  Endo/Heme/Allergies: Negative for environmental allergies and polydipsia. Bruises/bleeds easily.  Psychiatric/Behavioral: The patient is nervous/anxious.   All other systems reviewed and are negative.   Blood pressure 163/48, pulse 65, temperature 97.8 F (36.6 C), temperature source Oral, resp. rate 18, height 5'  3" (1.6 m), weight 59.875 kg (132 lb), SpO2 100.00%. Physical Exam  Nursing note and vitals reviewed. Constitutional: [name] oriented to person, place, and time. She appears well-developed and well-nourished.  Dry oral mucosal membranes with chapped lips  HENT:  Head: Normocephalic and atraumatic.  Nose: Nose normal.  Mouth/Throat: No oropharyngeal exudate.  Eyes: EOM are normal. Pupils are equal, round, and reactive to light. No scleral icterus.       Subconjunctival hemorrhage on the right with periorbital hyperpigmentation bilateral  Neck: Normal range of motion. Neck supple. No JVD present. No tracheal deviation present. No thyromegaly present.  Cardiovascular: Normal rate and regular rhythm.   Murmur heard.      2/6 holosystolic murmur over the outflow tract  Respiratory: Effort normal and breath sounds normal. No stridor. No respiratory distress. She has no wheezes. She has no rales. She exhibits no tenderness.  GI: Soft. She exhibits distension and mass. There is tenderness. There is guarding.       Midline distention that is localized between the subxiphoid and umbilical area consistent with ventral hernia. Tenderness is focal over this region.  Musculoskeletal: Normal range of motion. She exhibits no edema and no tenderness.       Left upper arm arteriovenous fistula has an excellent thrill and bruit.  Lymphadenopathy:    She has no cervical adenopathy.  Neurological: [name] alert and oriented to person, place, and time. She displays normal reflexes. No cranial nerve deficit. Coordination normal.  Skin: Skin is warm and dry. No rash noted. No erythema. No pallor.  Psychiatric: She has a normal mood and affect. Her behavior is normal. Judgment normal.    Assessment/Plan: 1. Chronic kidney disease stage IV: Renal function somewhat declined likely secondary to hemodynamic reasons from her diminished oral intake/third spacing of small bowel obstruction. Agree with intravenous  fluids as you're currently doing specifically normal saline administration with avoidance of potassium/bicarbonate rich fluids. Agree with holding her diuretic therapy as is currently being done on account of her volume status. We'll use intermittent boluses of diuretic as indicated and adjust fluids/replete electrolytes as indicated. She has a mature left upper arm arteriovenous fistula suitable for cannulation and initiation of hemodialysis should this be required. 2. Hypertension: With recent beta blocker use and anticipated surgery, we'll restart low-dose intravenous beta blocker therapy to avoid rebound hypertension/tachycardia as well as to confer some perioperative morbidity/mortality benefits. 3. Anemia of chronic kidney disease: Hemoglobin currently at goal and likely at its current level due to hemoconcentration from diminished fluid intake. We'll reassess closely postoperatively anticipating some losses/hemodilution for need to restart ESA therapy. 4. Metabolic bone disease: We'll check a calcium and phosphorus level postoperatively. Unfortunately, she will be n.p.o. for a few days postoperatively to promote bowel rest.  Jeremian Whitby K. 08/06/2011, 1:37 PM

## 2011-08-06 NOTE — Preoperative (Signed)
Beta Blockers   Reason not to administer Beta Blockers:Not Applicable, pt took Lopressor today at 1359

## 2011-08-06 NOTE — Op Note (Addendum)
08/06/2011  4:24 PM  PATIENT:  [name]  78 y.o. female  PRE-OPERATIVE DIAGNOSIS:  Recurrent incisional Hernia  POST-OPERATIVE DIAGNOSIS:  Recurrent incisional Hernia  PROCEDURE:  Procedure(s): RECURRENT INCISONAL HERNIA REPAIR WITH MESH ADULT  SURGEON:  Surgeon(s): Liz Malady, MD  PHYSICIAN ASSISTANT:   ASSISTANTS: Clance Boll, PAC; Rachel Rutfield, PAS   ANESTHESIA:   local and general  EBL:  Total I/O In: -  Out: 225 [Urine:200; Blood:25]  BLOOD ADMINISTERED:none  DRAINS: none   SPECIMEN:  No Specimen  DISPOSITION OF SPECIMEN:  PATHOLOGY  COUNTS:  YES  DICTATION: .Dragon Dictation patient was admitted from our office today by Dr. Dwain Sarna with recurrent incisional hernia and signs of partial small bowel obstruction. [name] brought to the operating room for repair. Nasogastric tube was placed preoperatively. She received intravenous antibiotics. She was identified in the preop holding area. Informed consent was obtained. She was brought to the operating room. General endotracheal anesthesia was administered by the anesthesia team. Her abdomen was prepped and draped in a sterile fashion. Time out procedure was done. Midline incision was made from above the previous scar to the lateral side of her umbilicus. Subcutaneous tissues were dissected down. This revealed 3 total hernia defects with the hernia sacs present. Each was carefully dissected out. There were all just above and around the umbilicus. Each only contained omentum which was reduced back into the abdomen. The sac of each was dissected off and discarded. There were multiple old sutures which appeared to be Prolene which were removed as needed. One of these actually was adherent to or involving the edge of the serosa of a piece of small bowel. This was likely making a kink and small bowel obstruction. Bowel was intact. The adhesions were lysed. The omentum was circumferentially freed up from the under side of the  abdominal wall musculature. The defects were all connected into one common defect. The fascia was cleared off from outside and inside of the peritoneal cavity. A few areas of omentum had bleeding and these were ligated with 2-0 silk. The small bowel was rechecked and noted to be intact. There were more adhesions present in the abdomen but the adhesiolysis was limited to this region. There is no dilated small bowel noted after the dissection. The area was irrigated Hemostasis was ensured Next the defect was measured and was approximately 4 x 5 cm. A 10 x 15 piece of Physiomesh was selected. This was placed as an inlay repair with good underlay of several centimeters all around. It was sutured in with interrupted #1 Novafil sutures. It laid down nicely. I was able to close the fascia over the top of the mesh with multiple interrupted #1 Novafil sutures. Area was copiously irrigated. Umbilicus was tacked back down with interrupted 2-0 Vicryl sutures. Subcutaneous tissues were closed with interrupted 2-0 Vicryl sutures. Skin was closed with staples. Sponge needle and as we counts were all correct. Patient tolerated procedure well without apparent complication was taken recovery in stable condition.  PATIENT DISPOSITION:  PACU - hemodynamically stable.   Delay start of Pharmacological VTE agent (>24hrs) due to surgical blood loss or risk of bleeding:  no  Violeta Gelinas, MD, MPH, FACS Pager: [phone]  6/11/20134:24 PM

## 2011-08-06 NOTE — Progress Notes (Signed)
Addended byEmelia Loron on: 08/06/2011 09:22 AM   Modules accepted: Orders

## 2011-08-06 NOTE — Anesthesia Preprocedure Evaluation (Signed)
Anesthesia Evaluation  Patient identified by MRN, date of birth, ID band Patient awake    Reviewed: Allergy & Precautions, H&P , NPO status , Patient's Chart, lab work & pertinent test results  Airway Mallampati: I TM Distance: >3 FB Neck ROM: full    Dental   Pulmonary          Cardiovascular hypertension, + Valvular Problems/Murmurs Rhythm:regular Rate:Normal     Neuro/Psych  Headaches, PSYCHIATRIC DISORDERS    GI/Hepatic   Endo/Other  Diabetes mellitus-  Renal/GU Dialysis, ESRF and CRF     Musculoskeletal   Abdominal   Peds  Hematology   Anesthesia Other Findings   Reproductive/Obstetrics                           Anesthesia Physical Anesthesia Plan  ASA: III  Anesthesia Plan: General   Post-op Pain Management:    Induction: Intravenous  Airway Management Planned: Oral ETT  Additional Equipment:   Intra-op Plan:   Post-operative Plan: Extubation in OR  Informed Consent: I have reviewed the patients History and Physical, chart, labs and discussed the procedure including the risks, benefits and alternatives for the proposed anesthesia with the patient or authorized representative who has indicated his/her understanding and acceptance.     Plan Discussed with: CRNA, Anesthesiologist and Surgeon  Anesthesia Plan Comments:         Anesthesia Quick Evaluation

## 2011-08-06 NOTE — Progress Notes (Signed)
Patient ID: [name], female   DOB: 02/13/1933, 78 y.o.   MRN: [id number]  Chief Complaint  Patient presents with  . New Evaluation    New Pt. Eval Ventral Hernia    HPI [name] is a 78 y.o. female.  Referral from ER Dr. Glick HPI 78 yof who has prior history of cholecystectomy, tah, and ventral hernia repair who since Thursday night has had increasing abdominal pain.  She has nausea no emesis with this.  She had a small bm yesterday.  She has significantly decreased flatus and last passed flatus on Saturday.  She was seen in er this weekend and underwent a ct scan that showed a bowel obstruction.  She was made pain free but this was with medication per her report.  She felt bad immediately again after that.  She was given an appt with us to follow up and today she reports signficant abdominal pain again.  Past Medical History  Diagnosis Date  . Hypertension   . Anxiety and depression   . Chronic headaches   . Diverticular disease   . Anemia   . Arthritis     osteoarthritis  . Osteoporosis   . Vitamin B 12 deficiency   . History of ITP   . Anxiety     takes xanax for sleep  . Diabetes mellitus     NIDDM x 12 years  . Heart murmur     benign;asymptomatic  . Chronic kidney disease     esrd    Past Surgical History  Procedure Date  . Cholecystectomy   . Av fistula placement 11/15/10    left upper arm AVF  . Abdominal hysterectomy 1973  . Joint replacement 2007    rt hip  . Eye surgery   . Cataract extraction w/ intraocular lens  implant, bilateral 2002  . Vascular surgery 10-2010    left AVF  . Hernia repair 2000    ventral    Family History  Problem Relation Age of Onset  . Stroke Mother   . Heart disease Father     Social History History  Substance Use Topics  . Smoking status: Never Smoker   . Smokeless tobacco: Current User    Types: Snuff  . Alcohol Use: No    No Known Allergies  Current Outpatient Prescriptions  Medication Sig Dispense  Refill  . acetaminophen (TYLENOL) 500 MG tablet Take 500 mg by mouth 2 (two) times daily as needed. For pain.      . alprazolam (XANAX) 2 MG tablet Take 1-2 mg by mouth at bedtime as needed. For sleep.       . atenolol (TENORMIN) 25 MG tablet Take 25 mg by mouth 2 (two) times daily.       . cloNIDine (CATAPRES) 0.1 MG tablet Take 0.1 mg by mouth 2 (two) times daily.        . cyanocobalamin (,VITAMIN B-12,) 1000 MCG/ML injection Inject 1,000 mcg into the muscle every 30 (thirty) days.       . darbepoetin (ARANESP) 150 MCG/0.3ML SOLN Inject 150 mcg into the skin every 30 (thirty) days.       . furosemide (LASIX) 20 MG tablet Take 20 mg by mouth daily.       . oxybutynin (DITROPAN) 5 MG tablet Take 5 mg by mouth daily.       . pioglitazone (ACTOS) 30 MG tablet Take 30 mg by mouth daily.       . predniSONE (DELTASONE)   5 MG tablet TAKE 2 TABLETS BY MOUTH EVERY MORNING  100 tablet  1  . simvastatin (ZOCOR) 40 MG tablet Take 40 mg by mouth at bedtime.          Review of Systems Review of Systems  Blood pressure 116/62, pulse 64, temperature 97.3 F (36.3 C), temperature source Temporal, resp. rate 16, height 5' 3.5" (1.613 m), weight 132 lb (59.875 kg).  Physical Exam Physical Exam  Vitals reviewed. Constitutional: She appears distressed.  Cardiovascular: Normal rate, regular rhythm and normal heart sounds.   Pulmonary/Chest: Effort normal and breath sounds normal. She has no wheezes. She has no rales.  Abdominal: She exhibits distension. Bowel sounds are decreased. There is tenderness in the periumbilical area. A hernia is present.      Data Reviewed CT ABDOMEN AND PELVIS WITHOUT CONTRAST  Technique: Multidetector CT imaging of the abdomen and pelvis was  performed following the standard protocol without intravenous  contrast.  Comparison: 02/01/2009  Findings: Sagittal images of the spine shows osteopenia. Stable  degenerative changes lumbar spine. Atherosclerotic calcifications  of  the coronary arteries again noted. Lung bases shows small  amount of posterior basilar atelectasis.  Study is limited without IV contrast. Atherosclerotic  calcifications are noted abdominal aorta, bilateral renal artery  origin, SMA origin and bilateral iliac arteries. No aortic  aneurysm. Stable multiple anterior abdominal wall small ventral  hernias containing fat without evidence of acute complication.  Again noted a small amount of perihepatic ascites. The patient is  status post cholecystectomy.  Unenhanced liver shows no biliary ductal dilatation. Unenhanced  pancreas, spleen and adrenal glands are unremarkable.  Unenhanced kidneys are symmetrical in size. No hydronephrosis or  hydroureter. There is a nonobstructive calcified calculus in the  lower pole of the right kidney measures 3.3 mm.  Distended small bowel loops are noted with air-fluid levels seen  mid and lower abdomen. The terminal small bowel is collapsed small  caliber. The findings are consistent with distal small bowel  obstruction. The transition point of obstruction is not clearly  identified. Small amount of pelvic free fluid is noted. Some  stool noted in the right and transverse colon. Normal appendix is  partially visualized in axial image 55.  Sigmoid colon diverticula are noted without evidence of acute  diverticulitis. The urinary bladder is under distended grossly  unremarkable. Evaluation of the pelvis is limited by streak  artifacts from right hip prosthesis. Left renal cyst is stable from  prior exam measures 2.4 cm.  IMPRESSION:  1.Distended small bowel loops are noted with air-fluid levels seen  mid and lower abdomen. The terminal small bowel is collapsed small  caliber. The findings are consistent with distal small bowel  obstruction. The transition point of obstruction is not clearly  identified. Small amount of pelvic free fluid is noted.  2. Stable multiple small abdominal wall ventral hernia  containing  fat without evidence of acute complication.  3. Normal appendix is partially visualized.  4. Again noted small amount of perihepatic ascites.  5. There is a right nonobstructive nephrolithiasis. No  hydronephrosis or hydroureter. Left renal cyst is stable from  prior exam.  6. Limited evaluation of the pelvis due to streak artifacts from  right hip prosthesis.   Assessment    Incarcerated ventral hernia with bowel obstruction    Plan    She has a fair amount of abdominal pain on her exam that is concerning. This is especially true given her use of steroids. She   also at least as a partial bowel obstruction associated with this clinically as well as by CT scan obtained 2 days ago. I recommended to her that she needs to be admitted and have a nasogastric tube placed. I also think she needs to go to  the operating room for repair this hernia which I think is likely causing her bowel obstruction.  I've discussed this with my partner Dr. Thompson he is going to see her when she gets in the hospital.       Chai Routh 08/06/2011, 9:07 AM    

## 2011-08-06 NOTE — Anesthesia Postprocedure Evaluation (Signed)
Anesthesia Post Note  Patient: [name]  Procedure(s) Performed: Procedure(s) (LRB): HERNIA REPAIR VENTRAL ADULT (N/A)  Anesthesia type: general  Patient location: PACU  Post pain: Pain level controlled  Post assessment: Patient's Cardiovascular Status Stable  Last Vitals:  Filed Vitals:   08/06/11 1636  BP:   Pulse:   Temp: 36.4 C  Resp:     Post vital signs: Reviewed and stable  Level of consciousness: sedated  Complications: No apparent anesthesia complications

## 2011-08-06 NOTE — Interval H&P Note (Signed)
History and Physical Interval Note:  08/06/2011 2:25 PM  [name]  has presented today for surgery, with the diagnosis of Ventral Hernia  The various methods of treatment have been discussed with the patient and family. After consideration of risks, benefits and other options for treatment, the patient has consented to  Procedure(s) (LRB): HERNIA REPAIR VENTRAL ADULT (N/A) as a surgical intervention .  The patients' history has been reviewed, patient examined, no change in status, stable for surgery.  I have reviewed the patients' chart and labs.  Questions were answered to the patient's satisfaction.     Kairah Leoni E

## 2011-08-06 NOTE — Transfer of Care (Signed)
Immediate Anesthesia Transfer of Care Note  Patient: [name]  Procedure(s) Performed: Procedure(s) (LRB): HERNIA REPAIR VENTRAL ADULT (N/A)  Patient Location: PACU  Anesthesia Type: General  Level of Consciousness: awake, alert  and oriented  Airway & Oxygen Therapy: Patient Spontanous Breathing and Patient connected to nasal cannula oxygen  Post-op Assessment: Report given to PACU RN and Post -op Vital signs reviewed and stable  Post vital signs: Reviewed and stable  Complications: No apparent anesthesia complications

## 2011-08-07 ENCOUNTER — Inpatient Hospital Stay (HOSPITAL_COMMUNITY): Payer: Medicare Other

## 2011-08-07 LAB — CBC
HCT: 38.7 % (ref 36.0–46.0)
Hemoglobin: 12 g/dL (ref 12.0–15.0)
RBC: 3.9 MIL/uL (ref 3.87–5.11)
WBC: 11.7 10*3/uL — ABNORMAL HIGH (ref 4.0–10.5)

## 2011-08-07 LAB — BASIC METABOLIC PANEL
BUN: 44 mg/dL — ABNORMAL HIGH (ref 6–23)
Creatinine, Ser: 2.56 mg/dL — ABNORMAL HIGH (ref 0.50–1.10)
GFR calc Af Amer: 20 mL/min — ABNORMAL LOW (ref >90)
GFR calc non Af Amer: 17 mL/min — ABNORMAL LOW (ref >90)
Glucose, Bld: 149 mg/dL — ABNORMAL HIGH (ref 70–99)
Potassium: 5.3 mEq/L — ABNORMAL HIGH (ref 3.5–5.1)

## 2011-08-07 LAB — RENAL FUNCTION PANEL
CO2: 19 mEq/L (ref 19–32)
Calcium: 8.8 mg/dL (ref 8.4–10.5)
Chloride: 98 mEq/L (ref 96–112)
GFR calc Af Amer: 19 mL/min — ABNORMAL LOW (ref >90)
GFR calc non Af Amer: 17 mL/min — ABNORMAL LOW (ref >90)
Glucose, Bld: 94 mg/dL (ref 70–99)
Sodium: 134 mEq/L — ABNORMAL LOW (ref 135–145)

## 2011-08-07 LAB — GLUCOSE, CAPILLARY
Glucose-Capillary: 100 mg/dL — ABNORMAL HIGH (ref 70–99)
Glucose-Capillary: 109 mg/dL — ABNORMAL HIGH (ref 70–99)
Glucose-Capillary: 179 mg/dL — ABNORMAL HIGH (ref 70–99)

## 2011-08-07 MED ORDER — SODIUM BICARBONATE 8.4 % IV SOLN
INTRAVENOUS | Status: DC
  Administered 2011-08-07 – 2011-08-08 (×4): via INTRAVENOUS
  Filled 2011-08-07 (×8): qty 150

## 2011-08-07 MED ORDER — WHITE PETROLATUM GEL
Status: AC
  Administered 2011-08-07: 20:00:00
  Filled 2011-08-07: qty 5

## 2011-08-07 NOTE — Progress Notes (Signed)
Patient ID: [name], female   DOB: [birth year], 78 y.o.   MRN: [id number] 1 Day Post-Op  Subjective: Patient seems very confused today, not oriented to place, person or time.  Reports abdominal pain.  Family reports confusion as well.    Objective: Vital signs in last 24 hours: Temp:  [97.3 F (36.3 C)-98.5 F (36.9 C)] 98.5 F (36.9 C) (06/12 0525) Pulse Rate:  [59-97] 80  (06/12 0525) Resp:  [9-40] 16  (06/12 0525) BP: (116-186)/(48-87) 146/87 mmHg (06/12 0525) SpO2:  [96 %-100 %] 96 % (06/12 0525) Weight:  [132 lb (59.875 kg)] 132 lb (59.875 kg) (06/11 1217)    Intake/Output from previous day: 06/11 0701 - 06/12 0700 In: 1252 [I.V.:1252] Out: 775 [Urine:750; Blood:25] Intake/Output this shift:    PE: Neuro: CN 3-12 grossly intact, grips equal, moves legs equally Abd: mildly distended, faint bowel sounds, dressing with some bloody drainage.    Lab Results:   Basename 08/07/11 0550 08/06/11 1029  WBC 11.7* 7.7  HGB 12.0 12.1  HCT 38.7 39.1  PLT 93* 96*   BMET  Basename 08/07/11 0550 08/06/11 1029  NA 134* 135  K 5.7* 4.6  CL 98 97  CO2 19 27  GLUCOSE 94 76  BUN 42* 38*  CREATININE 2.62* 2.23*  CALCIUM 8.8 9.4   PT/INR No results found for this basename: LABPROT:2,INR:2 in the last 72 hours CMP     Component Value Date/Time   NA 134* 08/07/2011 0550   K 5.7* 08/07/2011 0550   CL 98 08/07/2011 0550   CO2 19 08/07/2011 0550   GLUCOSE 94 08/07/2011 0550   BUN 42* 08/07/2011 0550   CREATININE 2.62* 08/07/2011 0550   CALCIUM 8.8 08/07/2011 0550   PROT 5.8* 08/04/2011 0950   ALBUMIN 3.2* 08/07/2011 0550   AST 11 08/04/2011 0950   ALT 8 08/04/2011 0950   ALKPHOS 39 08/04/2011 0950   BILITOT 0.6 08/04/2011 0950   GFRNONAA 17* 08/07/2011 0550   GFRAA 19* 08/07/2011 0550   Lipase     Component Value Date/Time   LIPASE 32 08/04/2011 0950       Studies/Results: No results found.  Anti-infectives: Anti-infectives     Start     Dose/Rate Route Frequency Ordered Stop     08/06/11 1400   ceFAZolin (ANCEF) IVPB 1 g/50 mL premix  Status:  Discontinued        1 g 100 mL/hr over 30 Minutes Intravenous 60 min pre-op 08/06/11 1313 08/06/11 1800           Assessment/Plan  1.  POD#1-Ventral hernia repair: some confusion today likely from anesthesia, no focal neuro symptoms, abd benign, start ambulation today, leave foley today for monitoring urine output given renal insuff, if ok can d/c foley tomorrow.   2.  Renal Insuff: Serum Cr trending up after surgery and some hyperkalemia today, will d/c all products with K, appreciate Renal's assistance.  LOS: 1 day    Cherae Marton 08/07/2011

## 2011-08-07 NOTE — Progress Notes (Signed)
Patient ID: [name], female   DOB: 07-02-1932, 78 y.o.   MRN: [id number]   [facility] Progress Note    Subjective:   Confused this morning- states that she has not had any surgery yet. Admits to having some shortness of breath.   Objective:   BP 146/87  Pulse 80  Temp 98.5 F (36.9 C) (Oral)  Resp 16  Ht 5\' 3"  (1.6 m)  Wt 59.875 kg (132 lb)  BMI 23.38 kg/m2  SpO2 96%  Intake/Output Summary (Last 24 hours) at 08/07/11 1000 Last data filed at 08/07/11 0600  Gross per 24 hour  Intake   1252 ml  Output    775 ml  Net    477 ml   Weight change:   Physical Exam: WGN:FAOZHYQMVHQ resting in bed- pleasant but confused ION:GEXBM RRR, S1 and S2 with HSM Resp:Coarse BS with rare Left base rales WUX:LKGM, anterior dressing intact/dry Ext:No LE edema  Imaging: No results found.  Labs: BMET  Lab 08/07/11 0550 08/06/11 1029 08/04/11 0950  NA 134* 135 142  K 5.7* 4.6 4.0  CL 98 97 103  CO2 19 27 30   GLUCOSE 94 76 111*  BUN 42* 38* 33*  CREATININE 2.62* 2.23* 1.94*  ALB -- -- --  CALCIUM 8.8 9.4 9.3  PHOS 5.9* -- --   CBC  Lab 08/07/11 0550 08/06/11 1029 08/04/11 0950  WBC 11.7* 7.7 7.9  NEUTROABS -- -- 5.9  HGB 12.0 12.1 12.3  HCT 38.7 39.1 39.7  MCV 99.2 99.0 100.0  PLT 93* 96* 97*    Medications:      . antiseptic oral rinse  15 mL Mouth Rinse q12n4p  . chlorhexidine  15 mL Mouth Rinse BID  . furosemide  20 mg Intravenous Daily  . hydrocortisone sod succinate (SOLU-CORTEF) injection  100 mg Intravenous Q8H  . insulin aspart  0-9 Units Subcutaneous Q4H  . metoprolol  5 mg Intravenous Q6H  . pantoprazole (PROTONIX) IV  40 mg Intravenous QHS  . DISCONTD:  ceFAZolin (ANCEF) IV  1 g Intravenous 60 min Pre-Op  . DISCONTD: hydrocortisone sodium succinate  100 mg Intravenous Q8H  . DISCONTD: metoprolol  5 mg Intravenous Once  . DISCONTD: metoprolol  5 mg Intravenous Q6H     Assessment/ Plan:   1. ARF on Chronic kidney disease stage IV:  Renal function declined likely secondary to hemodynamic fluctuations intraoperatively (hypotensive requiring ephedrine) with some probable ischemic ATN. Switch IVF to isotonic bicarbonate to correct acidosis and shift potassium. Check CXR. On low dose diuretic therapy- I will see if she needs an extra lasix bolus. She has a mature left upper arm arteriovenous fistula suitable for cannulation and initiation of hemodialysis should this be required.  2. Hypertension: Fair BP control on IV metoprolol  3. Anemia of chronic kidney disease: Hemoglobin currently at goal- will monitor for any drops 4. Metabolic bone disease: Hyperphosphatemic, anticipate she will be n.p.o. for a few days postoperatively.   Zetta Bills, MD 08/07/2011, 10:00 AM

## 2011-08-07 NOTE — Progress Notes (Signed)
Confusion somewhat better this PM Still some ileus Patient examined and I agree with the assessment and plan  Violeta Gelinas, MD, MPH, FACS Pager: [phone]  08/07/2011 5:52 PM

## 2011-08-08 ENCOUNTER — Encounter (HOSPITAL_COMMUNITY): Payer: Self-pay | Admitting: General Surgery

## 2011-08-08 LAB — BASIC METABOLIC PANEL
BUN: 43 mg/dL — ABNORMAL HIGH (ref 6–23)
CO2: 25 mEq/L (ref 19–32)
Calcium: 8.6 mg/dL (ref 8.4–10.5)
Chloride: 97 mEq/L (ref 96–112)
Creatinine, Ser: 2.56 mg/dL — ABNORMAL HIGH (ref 0.50–1.10)
GFR calc Af Amer: 20 mL/min — ABNORMAL LOW (ref >90)
GFR calc non Af Amer: 17 mL/min — ABNORMAL LOW (ref >90)
Glucose, Bld: 145 mg/dL — ABNORMAL HIGH (ref 70–99)
Potassium: 4.7 mEq/L (ref 3.5–5.1)
Sodium: 138 mEq/L (ref 135–145)

## 2011-08-08 LAB — CBC
HCT: 33.7 % — ABNORMAL LOW (ref 36.0–46.0)
Hemoglobin: 10.7 g/dL — ABNORMAL LOW (ref 12.0–15.0)
MCH: 30.6 pg (ref 26.0–34.0)
MCHC: 31.8 g/dL (ref 30.0–36.0)
MCV: 96.3 fL (ref 78.0–100.0)
Platelets: 7 10*3/uL — CL (ref 150–400)
RBC: 3.5 MIL/uL — ABNORMAL LOW (ref 3.87–5.11)
RDW: 15.7 % — ABNORMAL HIGH (ref 11.5–15.5)
WBC: 11.5 10*3/uL — ABNORMAL HIGH (ref 4.0–10.5)

## 2011-08-08 LAB — GLUCOSE, CAPILLARY
Glucose-Capillary: 186 mg/dL — ABNORMAL HIGH (ref 70–99)
Glucose-Capillary: 137 mg/dL — ABNORMAL HIGH (ref 70–99)

## 2011-08-08 NOTE — Progress Notes (Signed)
Still has an ileus Wound looks good Continue NGT Patient examined and I agree with the assessment and plan  Violeta Gelinas, MD, MPH, FACS Pager: [phone]  08/08/2011 11:07 AM

## 2011-08-08 NOTE — Progress Notes (Signed)
  [facility] Progress Note    Subjective:   Reports that she feels better and less confused/disoriented than yesterday   Objective:   BP 131/101  Pulse 79  Temp 98.4 F (36.9 C) (Oral)  Resp 20  Ht 5\' 3"  (1.6 m)  Wt 59.875 kg (132 lb)  BMI 23.38 kg/m2  SpO2 92%  Intake/Output Summary (Last 24 hours) at 08/08/11 7829 Last data filed at 08/08/11 5621  Gross per 24 hour  Intake 1435.75 ml  Output    650 ml  Net 785.75 ml   Weight change:   Physical Exam: HYQ:MVHQIONGEXB resting in bed MWU:XLKGM RRR, S1 and S2 with HSM Resp:CTA bilaterally, no rales/rhonchi WNU:UVOZ, anterior dressing dry with traced out hematoma outline Ext:No LE edema  Imaging: Dg Chest Port 1 View  08/07/2011  *RADIOLOGY REPORT*  Clinical Data: Breath  PORTABLE CHEST - 1 VIEW  Comparison: 11/15/2010  Findings: Study is limited by poor inspiration.  Cardiomediastinal silhouette is stable.  There is hazy left basilar atelectasis or infiltrate.  NG tube in place noted.  No pulmonary edema.  IMPRESSION: Limited study by poor inspiration.  Hazy left basilar atelectasis or infiltrate.  No pulmonary edema.  Original Report Authenticated By: Natasha Mead, M.D.    Labs: BMET  Lab 08/08/11 0700 08/07/11 1401 08/07/11 0550 08/06/11 1029 08/04/11 0950  NA 138 134* 134* 135 142  K 4.7 5.3* 5.7* 4.6 4.0  CL 97 98 98 97 103  CO2 25 18* 19 27 30   GLUCOSE 145* 149* 94 76 111*  BUN 43* 44* 42* 38* 33*  CREATININE 2.56* 2.56* 2.62* 2.23* 1.94*  ALB -- -- -- -- --  CALCIUM 8.6 8.9 8.8 9.4 9.3  PHOS -- -- 5.9* -- --   CBC  Lab 08/08/11 0700 08/07/11 0550 08/06/11 1029 08/04/11 0950  WBC 11.5* 11.7* 7.7 7.9  NEUTROABS -- -- -- 5.9  HGB 10.7* 12.0 12.1 12.3  HCT 33.7* 38.7 39.1 39.7  MCV 96.3 99.2 99.0 100.0  PLT 7* 93* 96* 97*    Medications:      . antiseptic oral rinse  15 mL Mouth Rinse q12n4p  . chlorhexidine  15 mL Mouth Rinse BID  . furosemide  20 mg Intravenous Daily  .  hydrocortisone sod succinate (SOLU-CORTEF) injection  100 mg Intravenous Q8H  . insulin aspart  0-9 Units Subcutaneous Q4H  . metoprolol  5 mg Intravenous Q6H  . pantoprazole (PROTONIX) IV  40 mg Intravenous QHS  . white petrolatum         Assessment/ Plan:   1. ARF on Chronic kidney disease stage IV: Renal function declined likely secondary to hemodynamic fluctuations intraoperatively (hypotensive requiring ephedrine) with some probable ischemic ATN. Continue isotonic bicarbonate to correct acidosis and shift potassium- continue low dose PO lasix for now as volume status appears permissive. No acute HD needs noted. She has a mature left upper arm arteriovenous fistula suitable for cannulation and initiation of hemodialysis should this be required.  2. Hypertension: Fair BP control on IV metoprolol  3. Anemia of chronic kidney disease: Hemoglobin currently at goal- will monitor for any drops  4. Metabolic bone disease: Hyperphosphatemic, anticipate she will be n.p.o. for a few days postoperatively. 5.Thrombocytopenia (h/o ITP): Significant drop in platelet count noted overnight- no catastrophic bleeding noted- will recheck count and consider platelet transfusion if persists to be low.  Zetta Bills, MD 08/08/2011, 9:38 AM

## 2011-08-08 NOTE — Progress Notes (Signed)
2 Days Post-Op  Subjective: Confusion appears to continue to recede, she is aware of where she is, aware of surgery. Reports nausea this am.  Objective: Vital signs in last 24 hours: Temp:  [97.7 F (36.5 C)-98.9 F (37.2 C)] 98.4 F (36.9 C) (06/13 9147) Pulse Rate:  [76-95] 79  (06/13 0637) Resp:  [18-20] 20  (06/13 0637) BP: (131-174)/(49-101) 131/101 mmHg (06/13 0637) SpO2:  [92 %-98 %] 92 % (06/13 0637)    Intake/Output from previous day: 06/12 0701 - 06/13 0700 In: 1435.8 [I.V.:1435.8] Out: 650 [Urine:600; Emesis/NG output:50] Intake/Output this shift:    General appearance: alert, cooperative, appears stated age, no distress and slowed mentation. Abdomen:dsg shows no further extension of dressing outside of previous marked border. Abdomen is soft, non-tender, no BS appreciated on exam. Negative flatus/BM.  Lab Results:   Basename 08/08/11 0700 08/07/11 0550  WBC 11.5* 11.7*  HGB 10.7* 12.0  HCT 33.7* 38.7  PLT PENDING 93*   BMET  Basename 08/08/11 0700 08/07/11 1401  NA 138 134*  K 4.7 5.3*  CL 97 98  CO2 25 18*  GLUCOSE 145* 149*  BUN 43* 44*  CREATININE 2.56* 2.56*  CALCIUM 8.6 8.9   PT/INR No results found for this basename: LABPROT:2,INR:2 in the last 72 hours ABG No results found for this basename: PHART:2,PCO2:2,PO2:2,HCO3:2 in the last 72 hours  Studies/Results: Dg Chest Port 1 View  08/07/2011  *RADIOLOGY REPORT*  Clinical Data: Breath  PORTABLE CHEST - 1 VIEW  Comparison: 11/15/2010  Findings: Study is limited by poor inspiration.  Cardiomediastinal silhouette is stable.  There is hazy left basilar atelectasis or infiltrate.  NG tube in place noted.  No pulmonary edema.  IMPRESSION: Limited study by poor inspiration.  Hazy left basilar atelectasis or infiltrate.  No pulmonary edema.  Original Report Authenticated By: Natasha Mead, M.D.    Anti-infectives: Anti-infectives     Start     Dose/Rate Route Frequency Ordered Stop   08/06/11 1400    ceFAZolin (ANCEF) IVPB 1 g/50 mL premix  Status:  Discontinued        1 g 100 mL/hr over 30 Minutes Intravenous 60 min pre-op 08/06/11 1313 08/06/11 1800          Assessment/Plan: s/p Procedure(s) (LRB): HERNIA REPAIR VENTRAL ADULT (N/A) Confusion somewhat better this PM, illeus remains. Plan: 1. Continue with NG for now 2. Antiemetics for nausea  LOS: 2 days    Elihu Milstein 08/08/2011

## 2011-08-09 DIAGNOSIS — K430 Incisional hernia with obstruction, without gangrene: Secondary | ICD-10-CM | POA: Diagnosis present

## 2011-08-09 LAB — GLUCOSE, CAPILLARY
Glucose-Capillary: 171 mg/dL — ABNORMAL HIGH (ref 70–99)
Glucose-Capillary: 126 mg/dL — ABNORMAL HIGH (ref 70–99)

## 2011-08-09 LAB — CBC
HCT: 32.7 % — ABNORMAL LOW (ref 36.0–46.0)
MCH: 30.8 pg (ref 26.0–34.0)
MCHC: 31.5 g/dL (ref 30.0–36.0)
RDW: 15.6 % — ABNORMAL HIGH (ref 11.5–15.5)

## 2011-08-09 LAB — RENAL FUNCTION PANEL
Calcium: 8.8 mg/dL (ref 8.4–10.5)
Creatinine, Ser: 2.45 mg/dL — ABNORMAL HIGH (ref 0.50–1.10)
GFR calc Af Amer: 21 mL/min — ABNORMAL LOW (ref >90)
GFR calc non Af Amer: 18 mL/min — ABNORMAL LOW (ref >90)
Phosphorus: 3.6 mg/dL (ref 2.3–4.6)
Sodium: 141 mEq/L (ref 135–145)

## 2011-08-09 MED ORDER — SODIUM CHLORIDE 0.9 % IV SOLN
INTRAVENOUS | Status: DC
  Administered 2011-08-09 – 2011-08-11 (×3): via INTRAVENOUS
  Administered 2011-08-12: 20 mL/h via INTRAVENOUS

## 2011-08-09 NOTE — Progress Notes (Signed)
Patient ID: [name], female   DOB: 1932/11/26, 78 y.o.   MRN: [id number]   [facility] Progress Note    Subjective:   Reports no emerging complaints- states she feels better today   Objective:   BP 118/96  Pulse 101  Temp 98.2 F (36.8 C) (Oral)  Resp 20  Ht 5\' 3"  (1.6 m)  Wt 59.875 kg (132 lb)  BMI 23.38 kg/m2  SpO2 94%  Intake/Output Summary (Last 24 hours) at 08/09/11 1046 Last data filed at 08/09/11 0600  Gross per 24 hour  Intake 603.75 ml  Output    975 ml  Net -371.25 ml   Weight change:   Physical Exam: WUJ:WJXBJYNWGNFA sleeping in bed OZH:YQMVH RRR, normal S1 and S2  Resp:CTA bilaterally, no rales QIO:NGEX, midline dressing noted with stain BMW:UXLKG LE edema  Imaging: Dg Chest Port 1 View  08/07/2011  *RADIOLOGY REPORT*  Clinical Data: Breath  PORTABLE CHEST - 1 VIEW  Comparison: 11/15/2010  Findings: Study is limited by poor inspiration.  Cardiomediastinal silhouette is stable.  There is hazy left basilar atelectasis or infiltrate.  NG tube in place noted.  No pulmonary edema.  IMPRESSION: Limited study by poor inspiration.  Hazy left basilar atelectasis or infiltrate.  No pulmonary edema.  Original Report Authenticated By: Natasha Mead, M.D.    Labs: BMET  Lab 08/09/11 0610 08/08/11 0700 08/07/11 1401 08/07/11 0550 08/06/11 1029 08/04/11 0950  NA 141 138 134* 134* 135 142  K 3.7 4.7 5.3* 5.7* 4.6 4.0  CL 95* 97 98 98 97 103  CO2 35* 25 18* 19 27 30   GLUCOSE 162* 145* 149* 94 76 111*  BUN 42* 43* 44* 42* 38* 33*  CREATININE 2.45* 2.56* 2.56* 2.62* 2.23* 1.94*  ALB -- -- -- -- -- --  CALCIUM 8.8 8.6 8.9 8.8 9.4 9.3  PHOS 3.6 -- -- 5.9* -- --   CBC  Lab 08/09/11 0610 08/08/11 1029 08/08/11 0700 08/07/11 0550 08/06/11 1029 08/04/11 0950  WBC 11.1* -- 11.5* 11.7* 7.7 --  NEUTROABS -- -- -- -- -- 5.9  HGB 10.3* -- 10.7* 12.0 12.1 --  HCT 32.7* -- 33.7* 38.7 39.1 --  MCV 97.9 -- 96.3 99.2 99.0 --  PLT 88* 104* 7* 93* -- --     Medications:      . antiseptic oral rinse  15 mL Mouth Rinse q12n4p  . chlorhexidine  15 mL Mouth Rinse BID  . furosemide  20 mg Intravenous Daily  . hydrocortisone sod succinate (SOLU-CORTEF) injection  100 mg Intravenous Q8H  . insulin aspart  0-9 Units Subcutaneous Q4H  . metoprolol  5 mg Intravenous Q6H  . pantoprazole (PROTONIX) IV  40 mg Intravenous QHS     Assessment/ Plan:   1. ARF on Chronic kidney disease stage IV: Renal function declined likely secondary to hemodynamic fluctuations intraoperatively (hypotensive requiring ephedrine) with some probable ischemic ATN.Switch fluid to isotonic saline with worsening alkalosis. No acute HD needs noted. She has a mature left upper arm arteriovenous fistula suitable for cannulation and initiation of hemodialysis should this be required.  2. Hypertension: Fair BP control on IV metoprolol  3. Anemia of chronic kidney disease: Hemoglobin currently at goal- will monitor for any drops  4. Metabolic bone disease: Hyperphosphatemic, anticipate she will be n.p.o. for a few days postoperatively.  5.Thrombocytopenia (h/o ITP): drop in platelet count noted overnight-- will consider platelet transfusion if bleeds.   Zetta Bills, MD 08/09/2011, 10:46 AM

## 2011-08-10 ENCOUNTER — Inpatient Hospital Stay (HOSPITAL_COMMUNITY): Payer: Medicare Other

## 2011-08-10 LAB — IRON AND TIBC
Iron: 70 ug/dL (ref 42–135)
Saturation Ratios: 36 % (ref 20–55)
TIBC: 193 ug/dL — ABNORMAL LOW (ref 250–470)

## 2011-08-10 LAB — GLUCOSE, CAPILLARY
Glucose-Capillary: 138 mg/dL — ABNORMAL HIGH (ref 70–99)
Glucose-Capillary: 98 mg/dL (ref 70–99)

## 2011-08-10 LAB — RENAL FUNCTION PANEL
Albumin: 2.7 g/dL — ABNORMAL LOW (ref 3.5–5.2)
Calcium: 8.6 mg/dL (ref 8.4–10.5)
GFR calc Af Amer: 26 mL/min — ABNORMAL LOW (ref >90)
GFR calc non Af Amer: 22 mL/min — ABNORMAL LOW (ref >90)
Phosphorus: 3.3 mg/dL (ref 2.3–4.6)
Potassium: 3.4 mEq/L — ABNORMAL LOW (ref 3.5–5.1)
Sodium: 144 mEq/L (ref 135–145)

## 2011-08-10 LAB — FERRITIN: Ferritin: 869 ng/mL — ABNORMAL HIGH (ref 10–291)

## 2011-08-10 LAB — CBC
MCHC: 30.4 g/dL (ref 30.0–36.0)
Platelets: 101 10*3/uL — ABNORMAL LOW (ref 150–400)
RDW: 15 % (ref 11.5–15.5)
WBC: 9.3 10*3/uL (ref 4.0–10.5)

## 2011-08-10 MED ORDER — CLONIDINE HCL 0.2 MG/24HR TD PTWK
0.2000 mg | MEDICATED_PATCH | TRANSDERMAL | Status: DC
  Administered 2011-08-10: 0.2 mg via TRANSDERMAL
  Filled 2011-08-10: qty 1

## 2011-08-10 MED ORDER — METOPROLOL TARTRATE 1 MG/ML IV SOLN
10.0000 mg | Freq: Four times a day (QID) | INTRAVENOUS | Status: DC
  Administered 2011-08-10 – 2011-08-15 (×21): 10 mg via INTRAVENOUS
  Filled 2011-08-10 (×18): qty 10
  Filled 2011-08-10: qty 5
  Filled 2011-08-10 (×7): qty 10

## 2011-08-10 MED ORDER — POTASSIUM CHLORIDE 10 MEQ/100ML IV SOLN
10.0000 meq | INTRAVENOUS | Status: AC
  Administered 2011-08-10 (×2): 10 meq via INTRAVENOUS
  Filled 2011-08-10: qty 100

## 2011-08-10 MED ORDER — POTASSIUM CHLORIDE 10 MEQ/100ML IV SOLN
INTRAVENOUS | Status: AC
  Administered 2011-08-10: 10 meq via INTRAVENOUS
  Filled 2011-08-10: qty 100

## 2011-08-10 MED ORDER — METOPROLOL TARTRATE 1 MG/ML IV SOLN
5.0000 mg | Freq: Once | INTRAVENOUS | Status: AC
  Administered 2011-08-10: 5 mg via INTRAVENOUS
  Filled 2011-08-10: qty 5

## 2011-08-10 MED ORDER — DARBEPOETIN ALFA-POLYSORBATE 60 MCG/0.3ML IJ SOLN
60.0000 ug | INTRAMUSCULAR | Status: DC
  Administered 2011-08-10 – 2011-08-17 (×2): 60 ug via SUBCUTANEOUS
  Filled 2011-08-10 (×3): qty 0.3

## 2011-08-10 NOTE — Progress Notes (Signed)
Patient ID: [name], female   DOB: [birth year], 78 y.o.   MRN: [id number]   [facility] Progress Note    Subjective:   Some agitation earlier this morning- now calm after sedative. Denies SOB or BM.   Objective:   BP 136/85  Pulse 86  Temp 99 F (37.2 C) (Oral)  Resp 16  Ht 5\' 3"  (1.6 m)  Wt 59.875 kg (132 lb)  BMI 23.38 kg/m2  SpO2 93%  Intake/Output Summary (Last 24 hours) at 08/10/11 0907 Last data filed at 08/10/11 0519  Gross per 24 hour  Intake 1470.25 ml  Output   1900 ml  Net -429.75 ml   Weight change:   Physical Exam: NWG:NFAOZHYQMVH resting in bed- NGT in-situ QIO:NGEXB RRR, s1 snd s2 with HSM Resp:CTA bilaterally, no rales MWU:XLKG, No Bowel sounds Ext:No LE edema  Imaging: No results found.  Labs: BMET  Lab 08/10/11 0600 08/09/11 0610 08/08/11 0700 08/07/11 1401 08/07/11 0550 08/06/11 1029 08/04/11 0950  NA 144 141 138 134* 134* 135 142  K 3.4* 3.7 4.7 5.3* 5.7* 4.6 4.0  CL 99 95* 97 98 98 97 103  CO2 34* 35* 25 18* 19 27 30   GLUCOSE 130* 162* 145* 149* 94 76 111*  BUN 42* 42* 43* 44* 42* 38* 33*  CREATININE 2.05* 2.45* 2.56* 2.56* 2.62* 2.23* 1.94*  ALB -- -- -- -- -- -- --  CALCIUM 8.6 8.8 8.6 8.9 8.8 9.4 9.3  PHOS 3.3 3.6 -- -- 5.9* -- --   CBC  Lab 08/10/11 0600 08/09/11 0610 08/08/11 1029 08/08/11 0700 08/07/11 0550 08/04/11 0950  WBC 9.3 11.1* -- 11.5* 11.7* --  NEUTROABS -- -- -- -- -- 5.9  HGB 9.5* 10.3* -- 10.7* 12.0 --  HCT 31.3* 32.7* -- 33.7* 38.7 --  MCV 99.1 97.9 -- 96.3 99.2 --  PLT 101* 88* 104* 7* -- --    Medications:      . antiseptic oral rinse  15 mL Mouth Rinse q12n4p  . chlorhexidine  15 mL Mouth Rinse BID  . furosemide  20 mg Intravenous Daily  . hydrocortisone sod succinate (SOLU-CORTEF) injection  100 mg Intravenous Q8H  . insulin aspart  0-9 Units Subcutaneous Q4H  . metoprolol  5 mg Intravenous Q6H  . pantoprazole (PROTONIX) IV  40 mg Intravenous QHS  . potassium chloride  10 mEq  Intravenous Q1 Hr x 2     Assessment/ Plan:   1. ARF on Chronic kidney disease stage IV: Renal function improving with non-oliguric urine output after what seems to have been ischemic ATN.On isotonic saline and no acute HD needs noted. She has a mature left upper arm arteriovenous fistula suitable for cannulation and initiation of hemodialysis should this be required.  2. Hypertension: Poor BP control on IV metoprolol- will increase dose  3. Anemia of chronic kidney disease: Hemoglobin drifting down slowly- continue to monitor, check iron stores and start ESA  4. Metabolic bone disease: Hyperphosphatemic, anticipate she will be n.p.o. for a few days postoperatively.  5.Thrombocytopenia (h/o ITP): drop in platelet count noted overnight-- will consider platelet transfusion if bleeds.   Zetta Bills, MD 08/10/2011, 9:07 AM

## 2011-08-10 NOTE — Progress Notes (Signed)
Still no output from NGT, adjusted forward approx 3-4 inches.  Placement verification via auscultation done

## 2011-08-10 NOTE — Progress Notes (Signed)
Renal MD notified of high bp. See new orders

## 2011-08-10 NOTE — Progress Notes (Signed)
4 Days Post-Op  Subjective: Daughter at bedside and she says that her confusion is improved and that this happens after every surgery she has.  Unsure if passing flatus, no BM.  Denies pain  Objective: Vital signs in last 24 hours: Temp:  [98.2 F (36.8 C)-99 F (37.2 C)] 99 F (37.2 C) (06/15 0514) Pulse Rate:  [78-124] 86  (06/15 0514) Resp:  [16-20] 16  (06/15 0514) BP: (118-182)/(75-98) 136/85 mmHg (06/15 0514) SpO2:  [93 %-94 %] 93 % (06/15 0514)    Intake/Output from previous day: 06/14 0701 - 06/15 0700 In: 1470.3 [I.V.:1470.3] Out: 1900 [Urine:1850; Emesis/NG output:50] Intake/Output this shift:    General appearance: no distress Resp: clear to auscultation bilaterally Cardio: normal rate GI: soft, nt, no significant distension or tympany, no sign of infection, NG with minimal output  Lab Results:   Basename 08/10/11 0600 08/09/11 0610  WBC 9.3 11.1*  HGB 9.5* 10.3*  HCT 31.3* 32.7*  PLT 101* 88*   BMET  Basename 08/10/11 0600 08/09/11 0610  NA 144 141  K 3.4* 3.7  CL 99 95*  CO2 34* 35*  GLUCOSE 130* 162*  BUN 42* 42*  CREATININE 2.05* 2.45*  CALCIUM 8.6 8.8   PT/INR No results found for this basename: LABPROT:2,INR:2 in the last 72 hours ABG No results found for this basename: PHART:2,PCO2:2,PO2:2,HCO3:2 in the last 72 hours  Studies/Results: No results found.  Anti-infectives: Anti-infectives     Start     Dose/Rate Route Frequency Ordered Stop   08/06/11 1400   ceFAZolin (ANCEF) IVPB 1 g/50 mL premix  Status:  Discontinued        1 g 100 mL/hr over 30 Minutes Intravenous 60 min pre-op 08/06/11 1313 08/06/11 1800          Assessment/Plan: s/p Procedure(s) (LRB): HERNIA REPAIR VENTRAL ADULT (N/A) unsure of bowel status because of lack of patient input but no significant bowel function on exam.  will check abdominal xrays and if they look reasonable will try to make some progress later today or tomorrow.  LOS: 4 days    Lodema Pilot  DAVID 08/10/2011

## 2011-08-11 ENCOUNTER — Inpatient Hospital Stay (HOSPITAL_COMMUNITY): Payer: Medicare Other

## 2011-08-11 LAB — GLUCOSE, CAPILLARY
Glucose-Capillary: 120 mg/dL — ABNORMAL HIGH (ref 70–99)
Glucose-Capillary: 138 mg/dL — ABNORMAL HIGH (ref 70–99)
Glucose-Capillary: 126 mg/dL — ABNORMAL HIGH (ref 70–99)
Glucose-Capillary: 113 mg/dL — ABNORMAL HIGH (ref 70–99)

## 2011-08-11 LAB — BASIC METABOLIC PANEL
BUN: 49 mg/dL — ABNORMAL HIGH (ref 6–23)
CO2: 29 mEq/L (ref 19–32)
Calcium: 8.8 mg/dL (ref 8.4–10.5)
Glucose, Bld: 147 mg/dL — ABNORMAL HIGH (ref 70–99)
Sodium: 144 mEq/L (ref 135–145)

## 2011-08-11 LAB — CBC
HCT: 30.1 % — ABNORMAL LOW (ref 36.0–46.0)
Hemoglobin: 9.2 g/dL — ABNORMAL LOW (ref 12.0–15.0)
MCH: 29.9 pg (ref 26.0–34.0)
MCV: 97.7 fL (ref 78.0–100.0)
RBC: 3.08 MIL/uL — ABNORMAL LOW (ref 3.87–5.11)
WBC: 8.4 10*3/uL (ref 4.0–10.5)

## 2011-08-11 LAB — RENAL FUNCTION PANEL
Albumin: 2.6 g/dL — ABNORMAL LOW (ref 3.5–5.2)
BUN: 50 mg/dL — ABNORMAL HIGH (ref 6–23)
Calcium: 8.7 mg/dL (ref 8.4–10.5)
Chloride: 100 mEq/L (ref 96–112)
Creatinine, Ser: 1.99 mg/dL — ABNORMAL HIGH (ref 0.50–1.10)

## 2011-08-11 LAB — DIFFERENTIAL
Eosinophils Relative: 0 % (ref 0–5)
Lymphocytes Relative: 9 % — ABNORMAL LOW (ref 12–46)
Lymphs Abs: 0.7 10*3/uL (ref 0.7–4.0)
Monocytes Absolute: 0.7 10*3/uL (ref 0.1–1.0)
Monocytes Relative: 8 % (ref 3–12)

## 2011-08-11 MED ORDER — WHITE PETROLATUM GEL
Status: AC
  Administered 2011-08-11: 21:00:00
  Filled 2011-08-11: qty 5

## 2011-08-11 MED ORDER — NALOXONE HCL 0.4 MG/ML IJ SOLN
0.4000 mg | Freq: Once | INTRAMUSCULAR | Status: AC
  Administered 2011-08-11: 0.4 mg via INTRAVENOUS

## 2011-08-11 MED ORDER — NALOXONE HCL 0.4 MG/ML IJ SOLN: 0.4000 mg | INTRAMUSCULAR | Status: AC | PRN

## 2011-08-11 MED ORDER — NALOXONE HCL 0.4 MG/ML IJ SOLN
INTRAMUSCULAR | Status: AC
  Administered 2011-08-11: 13:00:00 via INTRAVENOUS
  Filled 2011-08-11: qty 1

## 2011-08-11 MED ORDER — IOHEXOL 300 MG/ML  SOLN: 20.0000 mL | INTRAMUSCULAR | Status: DC

## 2011-08-11 NOTE — Progress Notes (Signed)
Subjective: Patient poorly responsive. Labs not back yet.   Objective Vital signs in last 24 hours: Filed Vitals:   08/10/11 1551 08/10/11 2139 08/11/11 0208 08/11/11 0614  BP: 190/93 188/76 190/87 168/80  Pulse: 87 85 82 87  Temp: 100.3 F (37.9 C) 98.1 F (36.7 C) 98.1 F (36.7 C) 98.9 F (37.2 C)  TempSrc: Oral Oral Oral Oral  Resp:  18 18 18   Height:      Weight:      SpO2: 99% 99% 95% 91%   Weight change:   Intake/Output Summary (Last 24 hours) at 08/11/11 1202 Last data filed at 08/11/11 0550  Gross per 24 hour  Intake      0 ml  Output   2175 ml  Net  -2175 ml   Labs: Basic Metabolic Panel:  Lab 08/10/11 1610 08/09/11 0610 08/08/11 0700 08/07/11 1401 08/07/11 0550 08/06/11 1029  NA 144 141 138 134* 134* 135  K 3.4* 3.7 4.7 5.3* 5.7* 4.6  CL 99 95* 97 98 98 97  CO2 34* 35* 25 18* 19 27  GLUCOSE 130* 162* 145* 149* 94 76  BUN 42* 42* 43* 44* 42* 38*  CREATININE 2.05* 2.45* 2.56* 2.56* 2.62* 2.23*  ALB -- -- -- -- -- --  CALCIUM 8.6 8.8 8.6 8.9 8.8 9.4  PHOS 3.3 3.6 -- -- 5.9* --   Liver Function Tests:  Lab 08/10/11 0600 08/09/11 0610 08/07/11 0550  AST -- -- --  ALT -- -- --  ALKPHOS -- -- --  BILITOT -- -- --  PROT -- -- --  ALBUMIN 2.7* 2.7* 3.2*   No results found for this basename: LIPASE:3,AMYLASE:3 in the last 168 hours No results found for this basename: AMMONIA:3 in the last 168 hours CBC:  Lab 08/11/11 1057 08/10/11 0600 08/09/11 0610 08/08/11 1029 08/08/11 0700  WBC 8.4 9.3 11.1* -- 11.5*  NEUTROABS 7.0 -- -- -- --  HGB 9.2* 9.5* 10.3* -- 10.7*  HCT 30.1* 31.3* 32.7* -- 33.7*  MCV 97.7 99.1 97.9 -- 96.3  PLT 102* 101* 88* 104* --   PT/INR: @labrcntip (inr:5) Cardiac Enzymes: No results found for this basename: CKTOTAL:5,CKMB:5,CKMBINDEX:5,TROPONINI:5 in the last 168 hours CBG:  Lab 08/11/11 0407 08/10/11 2351 08/10/11 1954 08/10/11 1713 08/10/11 1239  GLUCAP 120* 122* 138* 155* 117*    Iron Studies:  Lab 08/10/11 1100  IRON  70  TIBC 193*  TRANSFERRIN --  FERRITIN 869*    Physical Exam:  Blood pressure 168/80, pulse 87, temperature 98.9 F (37.2 C), temperature source Oral, resp. rate 18, height 5\' 3"  (1.6 m), weight 59.875 kg (132 lb), SpO2 91.00%.  Gen: somnolent, apneic episodes which are short, unable to arouse,  NGT in-situ, dry mouth RUE:AVWUJ RRR, s1 snd s2 with HSM  Resp:CTA bilaterally, no rales  WJX:BJYN, No Bowel sounds  Ext:No LE edema  Impression 1. ARF on Chronic kidney disease stage IV: due to ATN vs hypoperfusion.  Creat improving as of yest, labs pending today. Baseline creat 2.0.  D/C daily IV lasix, cont IVF's 2. AMS- difficult to arouse, suspect narcotic effect - will give Narcan, hold IV narcotis, frequent VS. Discussed with primary team. 3. Hypertension:  IV metoprolol increased + Catapres patch #2 4. Anemia of chronic kidney disease: Hemoglobin drifting down slowly- continue to monitor, check iron stores and start ESA  5. Metabolic bone disease: Hyperphosphatemic, anticipate she will be n.p.o. for a few days postoperatively.  6. Thrombocytopenia (h/o ITP): drop in platelet count noted overnight--  will consider platelet transfusion if bleeds.   Vinson Moselle  MD [facility] [phone] pgr    [phone] cell 08/11/2011, 12:02 PM

## 2011-08-11 NOTE — Progress Notes (Signed)
NGT advanced approx 8 cm per order

## 2011-08-11 NOTE — Progress Notes (Addendum)
5 Days Post-Op  Subjective: Still confused.  More NG output now that it has been advanced  Objective: Vital signs in last 24 hours: Temp:  [98.1 F (36.7 C)-100.3 F (37.9 C)] 98.9 F (37.2 C) (06/16 0614) Pulse Rate:  [82-98] 87  (06/16 0614) Resp:  [18-20] 18  (06/16 0614) BP: (168-213)/(76-98) 168/80 mmHg (06/16 0614) SpO2:  [91 %-99 %] 91 % (06/16 0614) Last BM Date:  (pre op)  Intake/Output from previous day: Aug 16, 2022 0701 - 06/16 0700 In: -  Out: 2175 [Urine:2075; Emesis/NG output:100] Intake/Output this shift:    General appearance: no distress Resp: nonlabored Cardio: normal rate, regular GI: soft, mild distension, some grimace with palpation around incision, she has a palpable bluge under incision which may be seroma vs. recurrent hernia, no peritoneal signs  Lab Results:   Basename Aug 16, 2011 0600 08/09/11 0610  WBC 9.3 11.1*  HGB 9.5* 10.3*  HCT 31.3* 32.7*  PLT 101* 88*   BMET  Basename 08-16-2011 0600 08/09/11 0610  NA 144 141  K 3.4* 3.7  CL 99 95*  CO2 34* 35*  GLUCOSE 130* 162*  BUN 42* 42*  CREATININE 2.05* 2.45*  CALCIUM 8.6 8.8   PT/INR No results found for this basename: LABPROT:2,INR:2 in the last 72 hours ABG No results found for this basename: PHART:2,PCO2:2,PO2:2,HCO3:2 in the last 72 hours  Studies/Results: Dg Abd 2 Views  2011/08/16  *RADIOLOGY REPORT*  Clinical Data:  evaluate postop ileus  ABDOMEN - 2 VIEW  Comparison: 08/04/2011  Findings: There is a nasogastric tube with tip just above the GE junction.  Dilated small bowel loops and air-fluid levels are identified.  Findings compatible with postoperative ileus.  Enteric contrast material from CT scan dated 08/04/2011  is identified within the proximal colon and distal small bowel.  IMPRESSION:  1.  Findings compatible with either partial small bowel obstruction or postoperative ileus.  Original Report Authenticated By: Rosealee Albee, M.D.    Anti-infectives: Anti-infectives    Start     Dose/Rate Route Frequency Ordered Stop   08/06/11 1400   ceFAZolin (ANCEF) IVPB 1 g/50 mL premix  Status:  Discontinued        1 g 100 mL/hr over 30 Minutes Intravenous 60 min pre-op 08/06/11 1313 08/06/11 1800          Assessment/Plan: s/p Procedure(s) (LRB): HERNIA REPAIR VENTRAL ADULT (N/A) It is difficult to discern her progress given her mental status, but since she is not showing signs of improvement and she has a bulge under her incision with limited adhesiolysis, will check CT abdomen to evaluate for possible recurrent hernia vs. SBO from adhesions vs. other.  she does not have peritonitis, remains AF and HD so doubt bowel injury.   will check CT abdomen.  LOS: 5 days    Casyn Becvar DAVID 08/11/2011  I have canceled her Ct for now.  We will start with labs and some abdominal xrays to evaluate.  I have reviewed her operative report and she did have a mesh repair and this is likely a seroma.  This could be a recurrent hernia or due to adhesions but since she is still so early postop, this could be due to postop ileus as well.  Will check xray and labs and if she shows any sign of deterioration will check CT

## 2011-08-12 ENCOUNTER — Other Ambulatory Visit: Payer: Self-pay

## 2011-08-12 ENCOUNTER — Inpatient Hospital Stay (HOSPITAL_COMMUNITY): Payer: Medicare Other

## 2011-08-12 DIAGNOSIS — R197 Diarrhea, unspecified: Secondary | ICD-10-CM

## 2011-08-12 DIAGNOSIS — G40109 Localization-related (focal) (partial) symptomatic epilepsy and epileptic syndromes with simple partial seizures, not intractable, without status epilepticus: Secondary | ICD-10-CM

## 2011-08-12 DIAGNOSIS — R569 Unspecified convulsions: Secondary | ICD-10-CM

## 2011-08-12 DIAGNOSIS — F329 Major depressive disorder, single episode, unspecified: Secondary | ICD-10-CM | POA: Insufficient documentation

## 2011-08-12 DIAGNOSIS — E870 Hyperosmolality and hypernatremia: Secondary | ICD-10-CM

## 2011-08-12 DIAGNOSIS — I10 Essential (primary) hypertension: Secondary | ICD-10-CM

## 2011-08-12 LAB — BASIC METABOLIC PANEL
BUN: 63 mg/dL — ABNORMAL HIGH (ref 6–23)
CO2: 27 mEq/L (ref 19–32)
CO2: 23 mEq/L (ref 19–32)
Chloride: 105 mEq/L (ref 96–112)
Chloride: 105 mEq/L (ref 96–112)
Creatinine, Ser: 2.4 mg/dL — ABNORMAL HIGH (ref 0.50–1.10)
Glucose, Bld: 133 mg/dL — ABNORMAL HIGH (ref 70–99)
Glucose, Bld: 179 mg/dL — ABNORMAL HIGH (ref 70–99)
Sodium: 148 mEq/L — ABNORMAL HIGH (ref 135–145)

## 2011-08-12 LAB — CBC
HCT: 29.3 % — ABNORMAL LOW (ref 36.0–46.0)
MCH: 30.9 pg (ref 26.0–34.0)
MCHC: 31.7 g/dL (ref 30.0–36.0)
MCHC: 31.2 g/dL (ref 30.0–36.0)
MCV: 97.7 fL (ref 78.0–100.0)
MCV: 99 fL (ref 78.0–100.0)
Platelets: 110 10*3/uL — ABNORMAL LOW (ref 150–400)
Platelets: 132 10*3/uL — ABNORMAL LOW (ref 150–400)
RDW: 14.6 % (ref 11.5–15.5)
RDW: 14.8 % (ref 11.5–15.5)
WBC: 9.8 10*3/uL (ref 4.0–10.5)

## 2011-08-12 LAB — BLOOD GAS, ARTERIAL
TCO2: 25.5 mmol/L (ref 0–100)
pCO2 arterial: 38.8 mmHg (ref 35.0–45.0)
pH, Arterial: 7.413 — ABNORMAL HIGH (ref 7.350–7.400)

## 2011-08-12 LAB — CARDIAC PANEL(CRET KIN+CKTOT+MB+TROPI)
Relative Index: INVALID (ref 0.0–2.5)
Total CK: 39 U/L (ref 7–177)

## 2011-08-12 LAB — DIFFERENTIAL
Basophils Absolute: 0 10*3/uL (ref 0.0–0.1)
Basophils Relative: 0 % (ref 0–1)
Eosinophils Absolute: 0 10*3/uL (ref 0.0–0.7)
Eosinophils Relative: 0 % (ref 0–5)
Lymphocytes Relative: 8 % — ABNORMAL LOW (ref 12–46)
Lymphs Abs: 0.7 10*3/uL (ref 0.7–4.0)
Monocytes Absolute: 0.4 10*3/uL (ref 0.1–1.0)
Monocytes Relative: 6 % (ref 3–12)
Neutro Abs: 6.7 10*3/uL (ref 1.7–7.7)
Neutrophils Relative %: 86 % — ABNORMAL HIGH (ref 43–77)

## 2011-08-12 LAB — GLUCOSE, CAPILLARY
Glucose-Capillary: 134 mg/dL — ABNORMAL HIGH (ref 70–99)
Glucose-Capillary: 147 mg/dL — ABNORMAL HIGH (ref 70–99)
Glucose-Capillary: 169 mg/dL — ABNORMAL HIGH (ref 70–99)
Glucose-Capillary: 137 mg/dL — ABNORMAL HIGH (ref 70–99)

## 2011-08-12 LAB — COMPREHENSIVE METABOLIC PANEL
ALT: 12 U/L (ref 0–35)
AST: 17 U/L (ref 0–37)
Albumin: 2.9 g/dL — ABNORMAL LOW (ref 3.5–5.2)
CO2: 26 mEq/L (ref 19–32)
Chloride: 107 mEq/L (ref 96–112)
GFR calc non Af Amer: 20 mL/min — ABNORMAL LOW (ref >90)
Potassium: 3.6 mEq/L (ref 3.5–5.1)
Sodium: 149 mEq/L — ABNORMAL HIGH (ref 135–145)
Total Bilirubin: 0.9 mg/dL (ref 0.3–1.2)

## 2011-08-12 MED ORDER — MAGIC MOUTHWASH
15.0000 mL | Freq: Four times a day (QID) | ORAL | Status: DC | PRN
  Filled 2011-08-12 (×2): qty 15

## 2011-08-12 MED ORDER — MENTHOL 3 MG MT LOZG
1.0000 | LOZENGE | OROMUCOSAL | Status: DC | PRN
  Filled 2011-08-12: qty 9

## 2011-08-12 MED ORDER — POTASSIUM CHLORIDE 10 MEQ/100ML IV SOLN
10.0000 meq | INTRAVENOUS | Status: AC
  Administered 2011-08-12 (×4): 10 meq via INTRAVENOUS
  Filled 2011-08-12 (×4): qty 100

## 2011-08-12 MED ORDER — LIP MEDEX EX OINT
1.0000 "application " | TOPICAL_OINTMENT | Freq: Two times a day (BID) | CUTANEOUS | Status: DC
  Administered 2011-08-12 – 2011-08-18 (×12): 1 via TOPICAL
  Filled 2011-08-12: qty 7

## 2011-08-12 MED ORDER — HYDROCORTISONE SOD SUCCINATE 100 MG IJ SOLR
50.0000 mg | Freq: Three times a day (TID) | INTRAMUSCULAR | Status: DC
  Administered 2011-08-13 – 2011-08-14 (×5): 50 mg via INTRAVENOUS
  Filled 2011-08-12 (×7): qty 1

## 2011-08-12 MED ORDER — DEXTROSE-NACL 5-0.9 % IV SOLN
INTRAVENOUS | Status: DC
  Administered 2011-08-12: 20:00:00 via INTRAVENOUS
  Administered 2011-08-13: 10 mL/h via INTRAVENOUS

## 2011-08-12 MED ORDER — BISACODYL 10 MG RE SUPP
10.0000 mg | Freq: Every day | RECTAL | Status: DC
  Administered 2011-08-12 – 2011-08-18 (×6): 10 mg via RECTAL
  Filled 2011-08-12 (×6): qty 1

## 2011-08-12 MED ORDER — SODIUM CHLORIDE 0.9 % IV SOLN
250.0000 mg | Freq: Two times a day (BID) | INTRAVENOUS | Status: DC
  Administered 2011-08-12 – 2011-08-13 (×3): 250 mg via INTRAVENOUS
  Filled 2011-08-12 (×8): qty 2.5

## 2011-08-12 MED ORDER — HYDRALAZINE HCL 20 MG/ML IJ SOLN
10.0000 mg | INTRAMUSCULAR | Status: DC | PRN
  Administered 2011-08-13 (×2): 10 mg via INTRAVENOUS
  Filled 2011-08-12: qty 0.5
  Filled 2011-08-12 (×5): qty 1

## 2011-08-12 NOTE — Progress Notes (Signed)
6 Days Post-Op  Subjective: Called to see patient following reported AMS episode, with possible seizure activity reported by son. Described as generalized adduction of upper arms with shaking. No prodrome, or c/o of HA, CP, nausea, voiced by patient per Son. Episode lasted <10-15 sec. Patient now in what appears to be post ictal like state. Labs,EKG,ABG,CT of head ordered. Dr. Jamey Ripa also at bedside.   Objective: Vital signs in last 24 hours: Temp:  [97.5 F (36.4 C)-99.2 F (37.3 C)] 99.1 F (37.3 C) (06/17 1630) Pulse Rate:  [80-107] 92  (06/17 1640) Resp:  [18-20] 20  (06/17 1640) BP: (122-194)/(49-100) 177/81 mmHg (06/17 1640) SpO2:  [90 %-100 %] 100 % (06/17 1640) Last BM Date:  (pre op)  Intake/Output from previous day: August 28, 2022 0701 - 06/17 0700 In: -  Out: 1175 [Urine:1000; Emesis/NG output:175] Intake/Output this shift: Total I/O In: 0  Out: 350 [Urine:300; Emesis/NG output:50]  General appearance: pale and in what appears to be a post ictal like state. She remains unresponsive to voice or painful stimuli. VS: are stable HR 80's with occassional skipped beats, O2 sats 99-100 on 2Lpm [place], BS 140's. BP 177/81.  Lab Results:   [facility] 08/12/11 0656 2011-08-28 1057  WBC 7.8 8.4  HGB 9.3* 9.2*  HCT 29.3* 30.1*  PLT 110* 102*   BMET  Basename 08/12/11 0656 28-Aug-2011 1057  NA 148* 144144  K 3.1* 3.3*3.3*  CL 105 100101  CO2 27 2829  GLUCOSE 133* 146*147*  BUN 58* 50*49*  CREATININE 2.20* 1.99*1.99*  CALCIUM 8.7 8.78.8   PT/INR No results found for this basename: LABPROT:2,INR:2 in the last 72 hours ABG  Basename 08/12/11 1658  PHART 7.413*  HCO3 24.3*    Studies/Results: Dg Abd 2 Views  28-Aug-2011  *RADIOLOGY REPORT*  Clinical Data: Abdominal pain.  Status post ventral hernia repair 5 days ago.  ABDOMEN - 2 VIEW  Comparison: 08/10/2011.  Findings: Again noted is some gas and stool scattered throughout the colon extending to the level of the distal  rectum.  There is several dilated loops of small bowel, largest which measures up to 6.5 cm in diameter in the left upper quadrant of the abdomen.  No definite evidence of pneumoperitoneum on the decubitus views. Multiple air fluid levels are noted on the decubitus views. Surgical staples are noted in the midline.  There are also clips in the right upper quadrant of the abdomen, likely from prior cholecystectomy.  A nasogastric tube is seen with tip and side port in the body of the stomach.   Status post right total hip arthroplasty.  IMPRESSION: 1.  Nonspecific bowel gas pattern again noted.  Findings are favored to represent a postoperative ileus, however, earlier partial small bowel obstruction would be difficult to entirely exclude. 2.  No pneumoperitoneum. 3.  Postoperative changes and support apparatus, as above.  Original Report Authenticated By: Florencia Reasons, M.D.    Anti-infectives: Anti-infectives     Start     Dose/Rate Route Frequency Ordered Stop   08/06/11 1400   ceFAZolin (ANCEF) IVPB 1 g/50 mL premix  Status:  Discontinued        1 g 100 mL/hr over 30 Minutes Intravenous 60 min pre-op 08/06/11 1313 08/06/11 1800          Assessment/Plan: s/p Procedure(s) (LRB): HERNIA REPAIR VENTRAL ADULT (N/A) 1. CT of head w/o contrast 2. Await lab results 3.Transfer to stepdown unit for closer monitoring  LOS: 6 days    Kessie Croston 08/12/2011

## 2011-08-12 NOTE — Progress Notes (Signed)
Subjective:  Patient more alert it sounds like, but very anxious.  Renal function was improving daily but BUN and creatinine did go up today. Great UOP Objective Vital signs in last 24 hours: Filed Vitals:   2011-08-21 2104 08/12/11 0201 08/12/11 0614 08/12/11 0958  BP: 193/85 184/84 193/89 122/90  Pulse: 80 102 90 93  Temp: 97.5 F (36.4 C) 98.7 F (37.1 C) 98.5 F (36.9 C) 98.5 F (36.9 C)  TempSrc: Oral Oral Oral Axillary  Resp: 18 18 18 18   Height:      Weight:      SpO2: 93% 93% 92% 90%   Weight change:   Intake/Output Summary (Last 24 hours) at 08/12/11 1049 Last data filed at 08/12/11 1047  Gross per 24 hour  Intake      0 ml  Output   1475 ml  Net  -1475 ml   Labs: Basic Metabolic Panel:  Lab 08/12/11 3244 08/21/11 1057 08/10/11 0600 08/09/11 0610  NA 148* 144144 144 --  K 3.1* 3.3*3.3* 3.4* --  CL 105 100101 99 --  CO2 27 2829 34* --  GLUCOSE 133* 146*147* 130* --  BUN 58* 50*49* 42* --  CREATININE 2.20* 1.99*1.99* 2.05* --  CALCIUM 8.7 8.78.8 8.6 --  ALB -- -- -- --  PHOS -- 2.9 3.3 3.6   Liver Function Tests:  Lab Aug 21, 2011 1057 08/10/11 0600 08/09/11 0610  AST -- -- --  ALT -- -- --  ALKPHOS -- -- --  BILITOT -- -- --  PROT -- -- --  ALBUMIN 2.6* 2.7* 2.7*   No results found for this basename: LIPASE:3,AMYLASE:3 in the last 168 hours No results found for this basename: AMMONIA:3 in the last 168 hours CBC:  Lab 08/12/11 0656 2011-08-21 1057 08/10/11 0600 08/09/11 0610 08/08/11 0700  WBC 7.8 8.4 9.3 -- --  NEUTROABS 6.7 7.0 -- -- --  HGB 9.3* 9.2* 9.5* -- --  HCT 29.3* 30.1* 31.3* -- --  MCV 97.7 97.7 99.1 97.9 96.3  PLT 110* 102* 101* -- --   Cardiac Enzymes: No results found for this basename: CKTOTAL:5,CKMB:5,CKMBINDEX:5,TROPONINI:5 in the last 168 hours CBG:  Lab 08/12/11 0805 08/12/11 0355 August 21, 2011 2337 08-21-11 1957 2011-08-21 1722  GLUCAP 136* 134* 140* 113* 126*    Iron Studies:  Basename 08/10/11 1100  IRON 70  TIBC 193*    TRANSFERRIN --  FERRITIN 869*   Studies/Results: Dg Abd 2 Views  08-21-11  *RADIOLOGY REPORT*  Clinical Data: Abdominal pain.  Status post ventral hernia repair 5 days ago.  ABDOMEN - 2 VIEW  Comparison: 08/10/2011.  Findings: Again noted is some gas and stool scattered throughout the colon extending to the level of the distal rectum.  There is several dilated loops of small bowel, largest which measures up to 6.5 cm in diameter in the left upper quadrant of the abdomen.  No definite evidence of pneumoperitoneum on the decubitus views. Multiple air fluid levels are noted on the decubitus views. Surgical staples are noted in the midline.  There are also clips in the right upper quadrant of the abdomen, likely from prior cholecystectomy.  A nasogastric tube is seen with tip and side port in the body of the stomach.   Status post right total hip arthroplasty.  IMPRESSION: 1.  Nonspecific bowel gas pattern again noted.  Findings are favored to represent a postoperative ileus, however, earlier partial small bowel obstruction would be difficult to entirely exclude. 2.  No pneumoperitoneum. 3.  Postoperative changes and  support apparatus, as above.  Original Report Authenticated By: Florencia Reasons, M.D.   Medications: Infusions:    . sodium chloride 75 mL/hr at 08/11/11 1191    Scheduled Medications:    . antiseptic oral rinse  15 mL Mouth Rinse q12n4p  . chlorhexidine  15 mL Mouth Rinse BID  . cloNIDine  0.2 mg Transdermal Weekly  . darbepoetin (ARANESP) injection - NON-DIALYSIS  60 mcg Subcutaneous Q Sat-1800  . hydrocortisone sod succinate (SOLU-CORTEF) injection  100 mg Intravenous Q8H  . insulin aspart  0-9 Units Subcutaneous Q4H  . metoprolol  10 mg Intravenous Q6H  . naloxone      . naloxone (NARCAN) injection  0.4 mg Intravenous Once  . pantoprazole (PROTONIX) IV  40 mg Intravenous QHS  . potassium chloride  10 mEq Intravenous Q1 Hr x 4  . white petrolatum      . DISCONTD:  furosemide  20 mg Intravenous Daily  . DISCONTD: iohexol  20 mL Oral Q1 Hr x 2    have reviewed scheduled and prn medications.  Physical Exam: General: pale, anxious, alert, complaining of pain Heart: RRR Lungs: mostly clr Abdomen: distended, currently painful,  Extremities: dependant edema  I Assessment/ Plan: Pt is a 78 y.o. yo female who was admitted on 08/06/2011 with an incarcerated ventral hernia, s/p surgical repair.  complicated by acute on chronic renal failure  Assessment/Plan: 1. ARF on Chronic kidney disease stage IV: due to ATN vs hypoperfusion. Creat improving up to yest, but worsened slightly today.  Still is right around baseline renal function, good UOP. 2. AMS- difficult to arouse yest, suspected narcotic effect -better today, although very anxious.  3. Hypertension: IV metoprolol increased + Catapres patch #2- also better.  Does not need any more IVF, will decrease 4. Anemia of chronic kidney disease: Hemoglobin drifting down slowly- continue to monitor, check iron stores and start ESA  5. Metabolic bone disease: Hyperphosphatemic, anticipate she will be n.p.o. for a few days postoperatively.  6. S/p ventral hernia repair with ileus- still with NG output, per surgery.   7. Hypokalemia- repleted per surgery.       Onie Hayashi A   08/12/2011,10:49 AM  LOS: 6 days

## 2011-08-12 NOTE — Progress Notes (Signed)
EKG ordered by Dr. Toniann Fail.  EKG done; relayed to Dr. Toniann Fail via phone that no ST segment elevation or depression was noted.

## 2011-08-12 NOTE — Progress Notes (Addendum)
6 Days Post-Op  Subjective: Generally feels okay, cooperative, but tearful about her situation. Daughter at bedside. Affect improved with discussion as to her treatment plan. Still c/o of llq pain with rebound tenderness.  Objective: Vital signs in last 24 hours: Temp:  [97.5 F (36.4 C)-98.7 F (37.1 C)] 98.5 F (36.9 C) (06/17 1308) Pulse Rate:  [77-102] 90  (06/17 0614) Resp:  [18] 18  (06/17 0614) BP: (181-194)/(49-103) 193/89 mmHg (06/17 0614) SpO2:  [92 %-98 %] 92 % (06/17 0614) Last BM Date:  (pre op)  Intake/Output from previous day: 08/22/2022 0701 - 06/17 0700 In: -  Out: 1175 [Urine:1000; Emesis/NG output:175] Intake/Output this shift:    General appearance: alert, cooperative, appears stated age and mild distress Abdomen: soft, tender in llq to palpation and re-bound, hypoactive BS, non distended. NG remains, draining dark bilious material (500cc in canister).  Lab Results:   Basename 08/12/11 0656 Aug 22, 2011 1057  WBC 7.8 8.4  HGB 9.3* 9.2*  HCT 29.3* 30.1*  PLT 110* 102*   BMET  Basename 08/12/11 0656 08-22-2011 1057  NA 148* 144144  K 3.1* 3.3*3.3*  CL 105 100101  CO2 27 2829  GLUCOSE 133* 146*147*  BUN 58* 50*49*  CREATININE 2.20* 1.99*1.99*  CALCIUM 8.7 8.78.8   PT/INR No results found for this basename: LABPROT:2,INR:2 in the last 72 hours ABG No results found for this basename: PHART:2,PCO2:2,PO2:2,HCO3:2 in the last 72 hours  Studies/Results: Dg Abd 2 Views  08/22/11  *RADIOLOGY REPORT*  Clinical Data: Abdominal pain.  Status post ventral hernia repair 5 days ago.  ABDOMEN - 2 VIEW  Comparison: 08/10/2011.  Findings: Again noted is some gas and stool scattered throughout the colon extending to the level of the distal rectum.  There is several dilated loops of small bowel, largest which measures up to 6.5 cm in diameter in the left upper quadrant of the abdomen.  No definite evidence of pneumoperitoneum on the decubitus views. Multiple air  fluid levels are noted on the decubitus views. Surgical staples are noted in the midline.  There are also clips in the right upper quadrant of the abdomen, likely from prior cholecystectomy.  A nasogastric tube is seen with tip and side port in the body of the stomach.   Status post right total hip arthroplasty.  IMPRESSION: 1.  Nonspecific bowel gas pattern again noted.  Findings are favored to represent a postoperative ileus, however, earlier partial small bowel obstruction would be difficult to entirely exclude. 2.  No pneumoperitoneum. 3.  Postoperative changes and support apparatus, as above.  Original Report Authenticated By: Florencia Reasons, M.D.    Anti-infectives: Anti-infectives     Start     Dose/Rate Route Frequency Ordered Stop   08/06/11 1400   ceFAZolin (ANCEF) IVPB 1 g/50 mL premix  Status:  Discontinued        1 g 100 mL/hr over 30 Minutes Intravenous 60 min pre-op 08/06/11 1313 08/06/11 1800          Assessment/Plan: s/p Procedure(s) (LRB): HERNIA REPAIR VENTRAL ADULT (N/A) 1.d/c foley 2.Continue all other orders.  LOS: 6 days    [name] 08/12/2011

## 2011-08-12 NOTE — Progress Notes (Signed)
Report given to 3300 nurse.

## 2011-08-12 NOTE — Progress Notes (Signed)
See notes of JD,PA. Abd fairly soft. Expect improvement in ileus over next day or two. May need CT if fails to progress.

## 2011-08-12 NOTE — Progress Notes (Signed)
Pt arrived with RR RN from CT, was on 5100, pulse 80s-140s.

## 2011-08-12 NOTE — Significant Event (Signed)
Rapid Response Event Note  Overview:  Called to see patient with sudden onset AMS changes.   Time Called: 1630 Arrival Time: 1635 Event Type: Neurologic  Initial Focused Assessment: On arrival patient supine in bed - pale - cool clammy skin - pale mucosa - resps shallow and regular -  Breath sounds clear.  HOB elevated.  NGT patent mod amounts golden bile return - abd soft.  Patient arouses to sternal rub with mild grimace.  Eyes deviated upward on exam.  Some mouth chewing motions noted.  Airway clear.  Staff report patient was alert  20 minutes prior to event.  Son states he was present in room - patient was alert - then he noted stiffness and withdrawal of upper extremities with shaking - last about 15 seconds or so - then patient was unresponsive.  Daughter states patient had seizure before when in hospital about 4 years ago - but has not been treated or had any since.  Labs reviewed - K+ 3.1 this Am - replaced with IV KCL per RN.   BP 170/90  HR 87 - monitor shows SR  RR 22 O2sats 100% on 2 liter nasal cannula.  CBG 169.   Interventions:  Positioned patient for airway protection with HOB elevated.  Patient has foley cath - no incontinence of stool noted.  Called for stat ABG, lab work, 12 Lead EKG.  Reviewed med list - no new meds today.  Had dose Narcan yesterday but no narcotics given since yesterday.  Clonidine patch noted.   Dr. Jamey Ripa and PA [place] present.  12 lead no changes.  Labs drawn.  Repeat BP  162/80  HR 87 - some speeding and slowing of HR noted.  Patient becoming somewhat arousable - opens eyes to sternal rub.  Moving UE purposeful.  To CT scan - then transferred to 3311. Noticed some increased frequency of elevated HR - to 120's - possibly atrial fib - clears without intervention- hard to assess on transport monitor.  Patient now opens eyes spont - states she is Ok - but then falls back to sleep.  ABG noted - WNL.  PA aware.  Handoff report to [employer].  Support given to family.      Event Summary: Name of Physician Notified: Dr. Jamey Ripa  paged prior to RRT arrival at      at    Outcome: Transferred (Comment)  Event End Time: 1745  [place]

## 2011-08-12 NOTE — Consult Note (Addendum)
Reason for Consult:Seizure and encephalopathy. Referring Physician: Dr.Gross. PCP - Dr.Bruchette at [facility]. [name] is an 78 y.o. female.  HPI: 78 year-old female with history of diabetes mellitus type 2, chronic kidney disease, anemia secondary to chronic kidney disease, history of ITP was admitted week ago after patient abdominal pain and was had undergone ventral hernia surgery. Post surgery patient had become more encephalopathic. Today around 4:00 in the evening patient had a generalized tonic-clonic convulsions witnessed by the patient's family which lasted for a few minutes after which patient's family feels she may was conscious. At this moment patient is confused and looking through her notes she was confused even previously. CT of the head did not show anything acute. At this time surgery has requested medical consult for recommendations with regard to her postictal state and seizures. 2 years ago patient did have an EEG done which was read as normal. As per family patient at that time also had a seizure. Patient was not on any antiseizure medications. Patient's car enzymes troponin is tender to be positive and EKG does not show any ST elevation or depression at this time and the patient is encephalopathic to give [place] any credible history on chest pain. Patient's CBGs noted during the seizures were more than 100 as confirmed with patient's nurse. Patient has been n.p.o. since her surgery due to ileus.  Past Medical History  Diagnosis Date  . Hypertension   . Anxiety and depression   . Chronic headaches   . Diverticular disease   . Anemia   . Arthritis     osteoarthritis  . Osteoporosis   . Vitamin B 12 deficiency   . History of ITP   . Anxiety     takes xanax for sleep  . Diabetes mellitus     NIDDM x 12 years  . Heart murmur     benign;asymptomatic  . Chronic kidney disease     esrd    Past Surgical History  Procedure Date  . Cholecystectomy   .  Av fistula placement 11/15/10    left upper arm AVF  . Abdominal hysterectomy 1973  . Joint replacement 2007    rt hip  . Eye surgery   . Cataract extraction w/ intraocular lens  implant, bilateral 2002  . Vascular surgery 10-2010    left AVF  . Hernia repair 2000    ventral  . Ventral hernia repair 08/06/2011    Procedure: HERNIA REPAIR VENTRAL ADULT;  Surgeon: Liz Malady, MD;  Location: [facility] OR;  Service: General;  Laterality: N/A;  Open Repair [employer] Hernia with Mesh    Family History  Problem Relation Age of Onset  . Stroke Mother   . Heart disease Father     Social History:  reports that she has never smoked. Her smokeless tobacco use includes Snuff. She reports that she does not drink alcohol or use illicit drugs.  Allergies: No Known Allergies  Medications: I have reviewed the patient's current medications.  Results for orders placed during the hospital encounter of 08/06/11 (from the past 48 hour(s))  GLUCOSE, CAPILLARY     Status: Abnormal   Collection Time   08/10/11 11:51 PM      Component Value Range Comment   Glucose-Capillary 122 (*) 70 - 99 mg/dL   GLUCOSE, CAPILLARY     Status: Abnormal   Collection Time   08/11/11  4:07 AM      Component Value Range Comment  Glucose-Capillary 120 (*) 70 - 99 mg/dL   GLUCOSE, CAPILLARY     Status: Abnormal   Collection Time   08/11/11  8:19 AM      Component Value Range Comment   Glucose-Capillary 157 (*) 70 - 99 mg/dL    Comment 1 Notify RN     GLUCOSE, CAPILLARY     Status: Abnormal   Collection Time   08/11/11  8:24 AM      Component Value Range Comment   Glucose-Capillary 126 (*) 70 - 99 mg/dL    Comment 1 Notify RN     RENAL FUNCTION PANEL     Status: Abnormal   Collection Time   08/11/11 10:57 AM      Component Value Range Comment   Sodium 144  135 - 145 mEq/L    Potassium 3.3 (*) 3.5 - 5.1 mEq/L    Chloride 100  96 - 112 mEq/L    CO2 28  19 - 32 mEq/L    Glucose, Bld 146 (*) 70 - 99 mg/dL    BUN  50 (*) 6 - 23 mg/dL    Creatinine, Ser 4.09 (*) 0.50 - 1.10 mg/dL    Calcium 8.7  8.4 - 81.1 mg/dL    Phosphorus 2.9  2.3 - 4.6 mg/dL    Albumin 2.6 (*) 3.5 - 5.2 g/dL    GFR calc non Af Amer 23 (*) >90 mL/min    GFR calc Af Amer 27 (*) >90 mL/min   CBC     Status: Abnormal   Collection Time   08/11/11 10:57 AM      Component Value Range Comment   WBC 8.4  4.0 - 10.5 K/uL    RBC 3.08 (*) 3.87 - 5.11 MIL/uL    Hemoglobin 9.2 (*) 12.0 - 15.0 g/dL    HCT 91.4 (*) 78.2 - 46.0 %    MCV 97.7  78.0 - 100.0 fL    MCH 29.9  26.0 - 34.0 pg    MCHC 30.6  30.0 - 36.0 g/dL    RDW 95.6  21.3 - 08.6 %    Platelets 102 (*) 150 - 400 K/uL CONSISTENT WITH PREVIOUS RESULT  DIFFERENTIAL     Status: Abnormal   Collection Time   08/11/11 10:57 AM      Component Value Range Comment   Neutrophils Relative 84 (*) 43 - 77 %    Neutro Abs 7.0  1.7 - 7.7 K/uL    Lymphocytes Relative 9 (*) 12 - 46 %    Lymphs Abs 0.7  0.7 - 4.0 K/uL    Monocytes Relative 8  3 - 12 %    Monocytes Absolute 0.7  0.1 - 1.0 K/uL    Eosinophils Relative 0  0 - 5 %    Eosinophils Absolute 0.0  0.0 - 0.7 K/uL    Basophils Relative 0  0 - 1 %    Basophils Absolute 0.0  0.0 - 0.1 K/uL   BASIC METABOLIC PANEL     Status: Abnormal   Collection Time   08/11/11 10:57 AM      Component Value Range Comment   Sodium 144  135 - 145 mEq/L    Potassium 3.3 (*) 3.5 - 5.1 mEq/L    Chloride 101  96 - 112 mEq/L    CO2 29  19 - 32 mEq/L    Glucose, Bld 147 (*) 70 - 99 mg/dL    BUN 49 (*) 6 - 23 mg/dL  Creatinine, Ser 1.99 (*) 0.50 - 1.10 mg/dL    Calcium 8.8  8.4 - 40.9 mg/dL    GFR calc non Af Amer 23 (*) >90 mL/min    GFR calc Af Amer 27 (*) >90 mL/min   GLUCOSE, CAPILLARY     Status: Abnormal   Collection Time   08/11/11 12:16 PM      Component Value Range Comment   Glucose-Capillary 138 (*) 70 - 99 mg/dL    Comment 1 Notify RN     GLUCOSE, CAPILLARY     Status: Abnormal   Collection Time   08/11/11  5:22 PM      Component Value  Range Comment   Glucose-Capillary 126 (*) 70 - 99 mg/dL    Comment 1 Notify RN     GLUCOSE, CAPILLARY     Status: Abnormal   Collection Time   08/11/11  7:57 PM      Component Value Range Comment   Glucose-Capillary 113 (*) 70 - 99 mg/dL   GLUCOSE, CAPILLARY     Status: Abnormal   Collection Time   08/11/11 11:37 PM      Component Value Range Comment   Glucose-Capillary 140 (*) 70 - 99 mg/dL   GLUCOSE, CAPILLARY     Status: Abnormal   Collection Time   08/12/11  3:55 AM      Component Value Range Comment   Glucose-Capillary 134 (*) 70 - 99 mg/dL   BASIC METABOLIC PANEL     Status: Abnormal   Collection Time   08/12/11  6:56 AM      Component Value Range Comment   Sodium 148 (*) 135 - 145 mEq/L    Potassium 3.1 (*) 3.5 - 5.1 mEq/L    Chloride 105  96 - 112 mEq/L    CO2 27  19 - 32 mEq/L    Glucose, Bld 133 (*) 70 - 99 mg/dL    BUN 58 (*) 6 - 23 mg/dL    Creatinine, Ser 8.11 (*) 0.50 - 1.10 mg/dL    Calcium 8.7  8.4 - 91.4 mg/dL    GFR calc non Af Amer 20 (*) >90 mL/min    GFR calc Af Amer 24 (*) >90 mL/min   CBC     Status: Abnormal   Collection Time   08/12/11  6:56 AM      Component Value Range Comment   WBC 7.8  4.0 - 10.5 K/uL    RBC 3.00 (*) 3.87 - 5.11 MIL/uL    Hemoglobin 9.3 (*) 12.0 - 15.0 g/dL    HCT 78.2 (*) 95.6 - 46.0 %    MCV 97.7  78.0 - 100.0 fL    MCH 31.0  26.0 - 34.0 pg    MCHC 31.7  30.0 - 36.0 g/dL    RDW 21.3  08.6 - 57.8 %    Platelets 110 (*) 150 - 400 K/uL CONSISTENT WITH PREVIOUS RESULT  DIFFERENTIAL     Status: Abnormal   Collection Time   08/12/11  6:56 AM      Component Value Range Comment   Neutrophils Relative 86 (*) 43 - 77 %    Neutro Abs 6.7  1.7 - 7.7 K/uL    Lymphocytes Relative 8 (*) 12 - 46 %    Lymphs Abs 0.7  0.7 - 4.0 K/uL    Monocytes Relative 6  3 - 12 %    Monocytes Absolute 0.4  0.1 - 1.0 K/uL    Eosinophils Relative  0  0 - 5 %    Eosinophils Absolute 0.0  0.0 - 0.7 K/uL    Basophils Relative 0  0 - 1 %    Basophils  Absolute 0.0  0.0 - 0.1 K/uL   GLUCOSE, CAPILLARY     Status: Abnormal   Collection Time   08/12/11  8:05 AM      Component Value Range Comment   Glucose-Capillary 136 (*) 70 - 99 mg/dL    Comment 1 Notify RN     GLUCOSE, CAPILLARY     Status: Abnormal   Collection Time   08/12/11 12:01 PM      Component Value Range Comment   Glucose-Capillary 135 (*) 70 - 99 mg/dL    Comment 1 Notify RN     GLUCOSE, CAPILLARY     Status: Abnormal   Collection Time   08/12/11  4:06 PM      Component Value Range Comment   Glucose-Capillary 147 (*) 70 - 99 mg/dL    Comment 1 Notify RN     BASIC METABOLIC PANEL     Status: Abnormal   Collection Time   08/12/11  4:46 PM      Component Value Range Comment   Sodium 149 (*) 135 - 145 mEq/L    Potassium 3.7  3.5 - 5.1 mEq/L    Chloride 105  96 - 112 mEq/L    CO2 23  19 - 32 mEq/L    Glucose, Bld 179 (*) 70 - 99 mg/dL    BUN 63 (*) 6 - 23 mg/dL    Creatinine, Ser 2.44 (*) 0.50 - 1.10 mg/dL    Calcium 8.9  8.4 - 01.0 mg/dL    GFR calc non Af Amer 18 (*) >90 mL/min    GFR calc Af Amer 21 (*) >90 mL/min   CBC     Status: Abnormal   Collection Time   08/12/11  4:46 PM      Component Value Range Comment   WBC 9.8  4.0 - 10.5 K/uL    RBC 3.14 (*) 3.87 - 5.11 MIL/uL    Hemoglobin 9.7 (*) 12.0 - 15.0 g/dL    HCT 27.2 (*) 53.6 - 46.0 %    MCV 99.0  78.0 - 100.0 fL    MCH 30.9  26.0 - 34.0 pg    MCHC 31.2  30.0 - 36.0 g/dL    RDW 64.4  03.4 - 74.2 %    Platelets 132 (*) 150 - 400 K/uL   GLUCOSE, CAPILLARY     Status: Abnormal   Collection Time   08/12/11  4:49 PM      Component Value Range Comment   Glucose-Capillary 169 (*) 70 - 99 mg/dL    Comment 1 Notify RN     BLOOD GAS, ARTERIAL     Status: Abnormal   Collection Time   08/12/11  4:58 PM      Component Value Range Comment   O2 Content 2.0      pH, Arterial 7.413 (*) 7.350 - 7.400    pCO2 arterial 38.8  35.0 - 45.0 mmHg    pO2, Arterial 123.0 (*) 80.0 - 100.0 mmHg    Bicarbonate 24.3 (*) 20.0 -  24.0 mEq/L    TCO2 25.5  0 - 100 mmol/L    Acid-Base Excess 0.2  0.0 - 2.0 mmol/L    O2 Saturation 99.6      Patient temperature 98.6      Collection site  RIGHT RADIAL      Drawn by [phone]      Sample type ARTERIAL DRAW      Allens test (pass/fail) PASS  PASS   AMMONIA     Status: Normal   Collection Time   08/12/11  6:00 PM      Component Value Range Comment   Ammonia 13  11 - 60 umol/L   CARDIAC PANEL(CRET KIN+CKTOT+MB+TROPI)     Status: Abnormal   Collection Time   08/12/11  6:48 PM      Component Value Range Comment   Total CK 38  7 - 177 U/L    CK, MB 3.6  0.3 - 4.0 ng/mL    Troponin I 0.67 (*) <0.30 ng/mL    Relative Index RELATIVE INDEX IS INVALID  0.0 - 2.5   GLUCOSE, CAPILLARY     Status: Abnormal   Collection Time   08/12/11  7:48 PM      Component Value Range Comment   Glucose-Capillary 137 (*) 70 - 99 mg/dL     Ct Head Wo Contrast  08/12/2011  *RADIOLOGY REPORT*  Clinical Data: Found unresponsive, questionable seizure.  CT HEAD WITHOUT CONTRAST  Technique:  Contiguous axial images were obtained from the base of the skull through the vertex without contrast.  Comparison: 07/20/2008  Findings: Some  images are degraded by motion.  Allowing for this, mild prominence of the sulci, cisterns, and ventricles, in keeping with volume loss. There are subcortical and periventricular white matter hypodensities, a nonspecific finding most often seen with chronic microangiopathic changes.  There is no evidence for acute hemorrhage, overt hydrocephalus, mass lesion, or abnormal extra-axial fluid collection.  No definite CT evidence for acute cortical based (large artery) infarction.  Nonspecific left sphenoid chamber mucosal thickening.  Otherwise, the visualized paranasal sinuses and mastoid air cells are predominately clear. NG tube present.  No displaced calvarial fracture.  Atherosclerotic vascular calcifications.  IMPRESSION: White matter changes and volume loss as above.  No definite  acute intracranial abnormality.  Original Report Authenticated By: Waneta Martins, M.D.   Dg Abd 2 Views  08/11/2011  *RADIOLOGY REPORT*  Clinical Data: Abdominal pain.  Status post ventral hernia repair 5 days ago.  ABDOMEN - 2 VIEW  Comparison: 08/10/2011.  Findings: Again noted is some gas and stool scattered throughout the colon extending to the level of the distal rectum.  There is several dilated loops of small bowel, largest which measures up to 6.5 cm in diameter in the left upper quadrant of the abdomen.  No definite evidence of pneumoperitoneum on the decubitus views. Multiple air fluid levels are noted on the decubitus views. Surgical staples are noted in the midline.  There are also clips in the right upper quadrant of the abdomen, likely from prior cholecystectomy.  A nasogastric tube is seen with tip and side port in the body of the stomach.   Status post right total hip arthroplasty.  IMPRESSION: 1.  Nonspecific bowel gas pattern again noted.  Findings are favored to represent a postoperative ileus, however, earlier partial small bowel obstruction would be difficult to entirely exclude. 2.  No pneumoperitoneum. 3.  Postoperative changes and support apparatus, as above.  Original Report Authenticated By: Florencia Reasons, M.D.    Review of Systems  Constitutional: Negative.   HENT: Negative.   Eyes: Negative.   Respiratory: Negative.   Cardiovascular: Negative.   Gastrointestinal: Negative.   Genitourinary: Negative.   Musculoskeletal: Negative.   Skin: Negative.  Neurological: Positive for seizures.  Endo/Heme/Allergies: Negative.   Psychiatric/Behavioral: Negative.    Blood pressure 157/96, pulse 92, temperature 98.5 F (36.9 C), temperature source Oral, resp. rate 20, height 5\' 3"  (1.6 m), weight 59.875 kg (132 lb), SpO2 100.00%. Physical Exam  Constitutional: She appears well-developed and well-nourished. No distress.  HENT:  Head: Normocephalic and atraumatic.    Right Ear: External ear normal.  Left Ear: External ear normal.  Nose: Nose normal.  Mouth/Throat: Oropharynx is clear and moist. No oropharyngeal exudate.  Eyes: Conjunctivae are normal. Pupils are equal, round, and reactive to light. Right eye exhibits no discharge. Left eye exhibits no discharge. No scleral icterus.  Neck: Normal range of motion. Neck supple.  Cardiovascular: Normal rate and regular rhythm.   Respiratory: Effort normal and breath sounds normal. No respiratory distress. She has no wheezes. She has no rales.  GI: Soft. Bowel sounds are normal.  Musculoskeletal: She exhibits no edema.       Bruising seen.  Neurological:       Drowsy but arousable. Follows commands. Moves all extremities. Oriented to her name only.  Skin: [name] not diaphoretic.       Bruising seen in extremities.    Assessment/Plan: #1. Possible seizure with postictal state - at this time have discussed with Dr. Levada Dy on-call neurologist who is evaluating the patient. EEG and MRI has been ordered. Along with seizures CVA and PRES is among the consideration. Further recommendations based on the EEG and MRI and neurologists opinion. Neurologist as planned to start Keppra. Patient's sodium levels are also high have a repeat metabolic panel at this time to check the sodium trends. Check LFTs. #2. Uncontrolled hypertension - continue her present medications and I have also added as needed IV hydralazine for systolic blood pressure more than 180. #3. Diabetes mellitus2 - patient is n.p.o. and is on IV fluids D5 normal saline. CBGs are being checked every 4 hours. Closely follow CBG. #4. Positive troponin - we will cycle cardiac markers. Patient's CK levels are normal. We'll also check a 2-D echo. #5. Chronic kidney disease - follow metabolic panel closely. Nephrologist on board. #6. Anemia probably secondary to chronic kidney disease - follow CBC. #7. History of ITP per records  - follow CBC closely.  #8.  Status post surgery for ventral hernia with postsurgical ileus presently n.p.o. and IV fluids - management per surgery.  T.hanks for involving [place] in patient's care we will follow along with you.   Eduard Clos 08/12/2011, 8:54 PM

## 2011-08-12 NOTE — Progress Notes (Signed)
CRITICAL VALUE ALERT  Critical value received:  Elevated troponin at 0.67  Date of notification:  08/12/2011  Time of notification:  1950  Critical value read back:yes  Nurse who received alert: Leta Baptist RN  MD notified (1st page):  MD Teddy Spike at bedside and made aware of level. EKG to be done  Time of first page:  na  MD notified (2nd page):  Time of second page:  Responding MD:  na  Time MD responded:  na

## 2011-08-12 NOTE — Progress Notes (Signed)
Patient to CT for CT Scan of the head.

## 2011-08-12 NOTE — Progress Notes (Signed)
Overheard Son saying " Mom, relax, relax.." Found patient unresponsive to verbal stimuli and to sternal rub . Skin is cold and clammy, lips cyanotic, Charge nurse made aware Vital signs taken wth BP of 177/81,P92, O2 started, Rapid response notified. NP made aware of patients status.

## 2011-08-12 NOTE — Progress Notes (Signed)
CTSP due to seizure. Events noted as above  Family just arrived to room.  General: Pt awakens, oriented x3 in no major acute distress at best.  Pt awakes to voice GCS 15 now w me & SDU RN, moving all 4 ext.  Eyes: PERRL, normal EOM. Sclera nonicteric Neuro: CN II-XII intact w/o focal sensory/motor deficits. Lymph: No head/neck/groin lymphadenopathy Psych:  No delerium/psychosis/paranoia HENT: Normocephalic, Mucus membranes moist.  No thrush Neck: Supple, No tracheal deviation Chest: No pain.  Good respiratory excursion. CV:  Pulses intact.  occ irreg, mostly Regular rhythm Abdomen: Soft, Nondistended.  Nontender.  No incarcerated hernias.  Incision closed.  Old ecchymosis.  Scant NGT output.  No evid aspiration Ext:  SCDs BLE.  No significant edema.  No cyanosis.  Hands in mitts for protection Skin: No petechiae / purpurae   O2 sat OK.  SBP stable.  Labs OK.  ABG OK.  CT head negative.  UOP OK BUN/Cr elevated but stable.  K 3.7.  Hgb unchanged  Have d/w ICU IM - Dr. Sharon Seller - whom has graciously offered to help w/u etiology of witnessed seizure. Tried to reach Neurology @ [phone] - no success yet

## 2011-08-12 NOTE — Consult Note (Signed)
Reason for Consult: seizure Referring Physician: Dr. Michaell Cowing (surgery)  Chief Complaint: seizure evaluation  HPI: [name] is an 78 y.o. female with history of chronic kidney disease stage IV, likely from hypertension and type 2 diabetes mellitus, also has idiopathic thrombocytopenic purpera on chronic corticosteroid therapy and history of osteoarthritis/osteoporosis. Her baseline creatinine ranges 1.8- 2.0.  Patient admitted for 08/06/11 for vental hernia and incarcerated bowel, s/p surgery on same day. Was gradually recovery in post-op period until today, when she had sudden onset unresponsiveness with convulsions.  Per nursing notes 08/06/11 at 16:40: "Overheard Son saying " Mom, relax, relax.." Found patient unresponsive to verbal stimuli and to sternal rub . Skin is cold and clammy, lips cyanotic, Charge nurse made aware Vital signs taken wth BP of 177/81,P92, O2 started".  Now patient transferred to 3300. Patients daughter at bedside. She reports history of similar episode in 2010, ? seizure, with apparent neg workup. Not started on anti-seizure meds at that time.  Review of Systems - patient unable to answer questions fully due to post-ictal state; complains of diffuse pain.  Past Medical History  Diagnosis Date  . Hypertension   . Anxiety and depression   . Chronic headaches   . Diverticular disease   . Anemia   . Arthritis     osteoarthritis  . Osteoporosis   . Vitamin B 12 deficiency   . History of ITP   . Anxiety     takes xanax for sleep  . Diabetes mellitus     NIDDM x 12 years  . Heart murmur     benign;asymptomatic  . Chronic kidney disease     esrd    Past Surgical History  Procedure Date  . Cholecystectomy   . Av fistula placement 11/15/10    left upper arm AVF  . Abdominal hysterectomy 1973  . Joint replacement 2007    rt hip  . Eye surgery   . Cataract extraction w/ intraocular lens  implant, bilateral 2002  . Vascular surgery 10-2010    left AVF  .  Hernia repair 2000    ventral  . Ventral hernia repair 08/06/2011    Procedure: HERNIA REPAIR VENTRAL ADULT;  Surgeon: Liz Malady, MD;  Location: [facility] OR;  Service: General;  Laterality: N/A;  Open Repair REcurrent Inc. Hernia with Mesh    Family History  Problem Relation Age of Onset  . Stroke Mother   . Heart disease Father     Social History:   reports that she has never smoked. Her smokeless tobacco use includes Snuff. She reports that she does not drink alcohol or use illicit drugs.  Allergies:  No Known Allergies  Outpatient Medications: Prescriptions prior to admission  Medication Sig Dispense Refill  . acetaminophen (TYLENOL) 500 MG tablet Take 500 mg by mouth 2 (two) times daily as needed. For pain.      Marland Kitchen alprazolam (XANAX) 2 MG tablet Take 1-2 mg by mouth at bedtime as needed. For sleep.       Marland Kitchen atenolol (TENORMIN) 25 MG tablet Take 25 mg by mouth 2 (two) times daily.       . cloNIDine (CATAPRES) 0.1 MG tablet Take 0.1 mg by mouth 2 (two) times daily.        . cyanocobalamin (,VITAMIN B-12,) 1000 MCG/ML injection Inject 1,000 mcg into the muscle every 30 (thirty) days.       . darbepoetin (ARANESP) 150 MCG/0.3ML SOLN Inject 150 mcg into the skin every 30 (thirty)  days. As needed. Only receives if blood test is low      . furosemide (LASIX) 20 MG tablet Take 20 mg by mouth daily.       Marland Kitchen oxybutynin (DITROPAN) 5 MG tablet Take 5 mg by mouth daily.       . pioglitazone (ACTOS) 30 MG tablet Take 30 mg by mouth daily.       . predniSONE (DELTASONE) 5 MG tablet TAKE 2 TABLETS BY MOUTH EVERY MORNING  100 tablet  1  . simvastatin (ZOCOR) 40 MG tablet Take 40 mg by mouth at bedtime.          Inpatient Medications:    . antiseptic oral rinse  15 mL Mouth Rinse q12n4p  . bisacodyl  10 mg Rectal Daily  . chlorhexidine  15 mL Mouth Rinse BID  . cloNIDine  0.2 mg Transdermal Weekly  . darbepoetin (ARANESP) injection - NON-DIALYSIS  60 mcg Subcutaneous Q Sat-1800  .  hydrocortisone sod succinate (SOLU-CORTEF) injection  50 mg Intravenous Q8H  . insulin aspart  0-9 Units Subcutaneous Q4H  . lip balm  1 application Topical BID  . metoprolol  10 mg Intravenous Q6H  . pantoprazole (PROTONIX) IV  40 mg Intravenous QHS  . potassium chloride  10 mEq Intravenous Q1 Hr x 4  . white petrolatum      . DISCONTD: hydrocortisone sod succinate (SOLU-CORTEF) injection  100 mg Intravenous Q8H   magic mouthwash, menthol-cetylpyridinium, naloxone (NARCAN) injection, ondansetron  Labs: Lab Results  Component Value Date   WBC 9.8 08/12/2011   HGB 9.7* 08/12/2011   HCT 31.1* 08/12/2011   MCV 99.0 08/12/2011   PLT 132* 08/12/2011   Lab Results  Component Value Date   CREATININE 2.40* 08/12/2011   BUN 63* 08/12/2011   NA 149* 08/12/2011   K 3.7 08/12/2011   CL 105 08/12/2011   CO2 23 08/12/2011   ABG    Component Value Date/Time   PHART 7.413* 08/12/2011 1658   PCO2ART 38.8 08/12/2011 1658   PO2ART 123.0* 08/12/2011 1658   HCO3 24.3* 08/12/2011 1658   TCO2 25.5 08/12/2011 1658   ACIDBASEDEF 5.0* 07/14/2008 1948   O2SAT 99.6 08/12/2011 1658   Lab Results  Component Value Date   ALT 8 08/04/2011   AST 11 08/04/2011   ALKPHOS 39 08/04/2011   BILITOT 0.6 08/04/2011   Lab Results  Component Value Date   TSH 3.719 08/12/2008   Lab Results  Component Value Date   VITAMINB12 737 04/24/2011   08/12/11 - Ammonia 13  NEUROIMAGING STUDIES  08/12/2011  CT HEAD WITHOUT CONTRAST  Findings: Some  images are degraded by motion.  Allowing for this, mild prominence of the sulci, cisterns, and ventricles, in keeping with volume loss. There are subcortical and periventricular white matter hypodensities, a nonspecific finding most often seen with chronic microangiopathic changes.  There is no evidence for acute hemorrhage, overt hydrocephalus, mass lesion, or abnormal extra-axial fluid collection.  No definite CT evidence for acute cortical based (large artery) infarction.  Nonspecific left  sphenoid chamber mucosal thickening.  Otherwise, the visualized paranasal sinuses and mastoid air cells are predominately clear. NG tube present.  No displaced calvarial fracture.  Atherosclerotic vascular calcifications.  IMPRESSION: White matter changes and volume loss as above.  No definite acute intracranial abnormality.  Original Report Authenticated By: Waneta Martins, M.D.  I reviewed CT head imaging and agree. Moderate temporal atrophy and moderate chronic small vessel ischemic disease. -VRP  Physical exam: Blood  pressure 157/96, pulse 92, temperature 98.5 F (36.9 C), temperature source Oral, resp. rate 20, height 5\' 3"  (1.6 m), weight 59.875 kg (132 lb), SpO2 100.00%. Temp:  [97.5 F (36.4 C)-99.2 F (37.3 C)] 98.5 F (36.9 C) (06/17 1746) Pulse Rate:  [80-107] 92  (06/17 1640) Resp:  [18-20] 20  (06/17 1640) BP: (122-193)/(81-100) 157/96 mmHg (06/17 1746) SpO2:  [90 %-100 %] 100 % (06/17 1640)  GENERAL EXAM: PATIENT IS CONFUSED, EYES CLOSED. SAYS SOME WORDS AND PHRASES.  CARDIOVASCULAR: Regular rate and rhythm, no murmurs, no carotid bruits; LEFT FOREARM THRILL.  NEUROLOGIC: MENTAL STATUS: EYES CLOSED, RESPONDS TO VERBAL AND STIM. FOLLOWS SOME COMMANDS BUT NOT ALL. CANNOT OPEN EYES TO COMMAND.  CRANIAL NERVE: pupils equal and reactive to light, ROVING EY MOVEMENTS.  No nystagmus, facial sensation and strength symmetric MOTOR: normal bulk and DECREASED TONE THROUGHOUT. CANNOT HOLD ARMS OR LEGS ANTIGRAVITY. WITHDRAWS IN ALL EXTREMITIES TO STIM. SENSORY: normal and symmetric to light touch AND pinprick COORDINATION: NOT FOLLOWING COMMANDS. REFLEXES: BUE 1, RIGHT KNEE 1, LEFT KNEE 0, ABSENT AT ANKLES. DOWN GOING TOES. GAIT/STATION: BEDREST.  Assessment/Plan: 78 y.o. female with history of chronic kidney disease stage IV, likely from hypertension and type 2 diabetes mellitus, also with idiopathic thrombocytopenic purpera on chronic corticosteroid therapy. Admitted with  ventral hernia, s/p repair (08/06/11). Now with suspected convulsive seizure. Could be second in life, with suspicious event in 2010. Temp max 99.2 at 13:49 and BP max 193/89 at 6am today.  Ddx of seizure: CNS structural, stroke, posterior reversible encephalopathy syndrome (HTN and renal insufficiency are risk factors), metabolic, infectious  RECS: 1. MRI brain (without) 2. EEG 3. Start levetiracetam 250mg  IV q12 (renal insufficiency dosing) 4. Discussed with Dr. Toniann Fail.   Joycelyn Schmid, MD [facility] 08/12/2011, 8:40 PM

## 2011-08-13 ENCOUNTER — Inpatient Hospital Stay (HOSPITAL_COMMUNITY): Payer: Medicare Other

## 2011-08-13 DIAGNOSIS — R569 Unspecified convulsions: Secondary | ICD-10-CM

## 2011-08-13 DIAGNOSIS — G40109 Localization-related (focal) (partial) symptomatic epilepsy and epileptic syndromes with simple partial seizures, not intractable, without status epilepticus: Secondary | ICD-10-CM

## 2011-08-13 DIAGNOSIS — E870 Hyperosmolality and hypernatremia: Secondary | ICD-10-CM

## 2011-08-13 DIAGNOSIS — I10 Essential (primary) hypertension: Secondary | ICD-10-CM

## 2011-08-13 DIAGNOSIS — R197 Diarrhea, unspecified: Secondary | ICD-10-CM

## 2011-08-13 LAB — RENAL FUNCTION PANEL
Albumin: 2.7 g/dL — ABNORMAL LOW (ref 3.5–5.2)
BUN: 68 mg/dL — ABNORMAL HIGH (ref 6–23)
Creatinine, Ser: 2.38 mg/dL — ABNORMAL HIGH (ref 0.50–1.10)
Phosphorus: 3.7 mg/dL (ref 2.3–4.6)
Potassium: 3.7 mEq/L (ref 3.5–5.1)

## 2011-08-13 LAB — GLUCOSE, CAPILLARY
Glucose-Capillary: 125 mg/dL — ABNORMAL HIGH (ref 70–99)
Glucose-Capillary: 154 mg/dL — ABNORMAL HIGH (ref 70–99)
Glucose-Capillary: 170 mg/dL — ABNORMAL HIGH (ref 70–99)

## 2011-08-13 LAB — MAGNESIUM: Magnesium: 2.4 mg/dL (ref 1.5–2.5)

## 2011-08-13 LAB — CARDIAC PANEL(CRET KIN+CKTOT+MB+TROPI)
CK, MB: 3.8 ng/mL (ref 0.3–4.0)
Relative Index: INVALID (ref 0.0–2.5)
Relative Index: INVALID (ref 0.0–2.5)
Relative Index: INVALID (ref 0.0–2.5)
Total CK: 43 U/L (ref 7–177)
Troponin I: 0.64 ng/mL (ref <0.30)
Troponin I: 0.64 ng/mL (ref <0.30)

## 2011-08-13 MED ORDER — SODIUM CHLORIDE 0.9 % IV SOLN
Freq: Once | INTRAVENOUS | Status: AC
  Administered 2011-08-13: 500 mL/h via INTRAVENOUS

## 2011-08-13 NOTE — Progress Notes (Signed)
INITIAL ADULT NUTRITION ASSESSMENT Date: 08/13/2011   Time: 12:50 PM  Reason for Assessment: Low Braden  ASSESSMENT: Female 78 y.o.  Dx: Recurrent incisional hernia with incarceration and distal SBO; S/P surgical repair 6/11  Hx:  Past Medical History  Diagnosis Date  . Hypertension   . Anxiety and depression   . Chronic headaches   . Diverticular disease   . Anemia   . Arthritis     osteoarthritis  . Osteoporosis   . Vitamin B 12 deficiency   . History of ITP   . Anxiety     takes xanax for sleep  . Diabetes mellitus     NIDDM x 12 years  . Heart murmur     benign;asymptomatic  . Chronic kidney disease     esrd   Past Surgical History  Procedure Date  . Cholecystectomy   . Av fistula placement 11/15/10    left upper arm AVF  . Abdominal hysterectomy 1973  . Joint replacement 2007    rt hip  . Eye surgery   . Cataract extraction w/ intraocular lens  implant, bilateral 2002  . Vascular surgery 10-2010    left AVF  . Hernia repair 2000    ventral  . Ventral hernia repair 08/06/2011    Procedure: HERNIA REPAIR VENTRAL ADULT;  Surgeon: Liz Malady, MD;  Location: [facility] OR;  Service: General;  Laterality: N/A;  Open Repair REcurrent Inc. Hernia with Mesh    Related Meds:  Scheduled Meds:   . sodium chloride   Intravenous Once  . antiseptic oral rinse  15 mL Mouth Rinse q12n4p  . bisacodyl  10 mg Rectal Daily  . chlorhexidine  15 mL Mouth Rinse BID  . cloNIDine  0.2 mg Transdermal Weekly  . darbepoetin (ARANESP) injection - NON-DIALYSIS  60 mcg Subcutaneous Q Sat-1800  . hydrocortisone sod succinate (SOLU-CORTEF) injection  50 mg Intravenous Q8H  . insulin aspart  0-9 Units Subcutaneous Q4H  . levetiracetam  250 mg Intravenous Q12H  . lip balm  1 application Topical BID  . metoprolol  10 mg Intravenous Q6H  . pantoprazole (PROTONIX) IV  40 mg Intravenous QHS  . potassium chloride  10 mEq Intravenous Q1 Hr x 4  . DISCONTD: hydrocortisone sod succinate  (SOLU-CORTEF) injection  100 mg Intravenous Q8H   Continuous Infusions:   . dextrose 5 % and 0.9% NaCl 50 mL/hr at 08/12/11 1959  . DISCONTD: sodium chloride 20 mL/hr (08/12/11 1102)   PRN Meds:.hydrALAZINE, magic mouthwash, menthol-cetylpyridinium, ondansetron   Ht: 5\' 3"  (160 cm)  Wt: 132 lb (59.875 kg)  Ideal Wt: 52.3 kg % Ideal Wt: 114%  Wt Readings from Last 14 Encounters:  08/06/11 132 lb (59.875 kg)  08/06/11 132 lb (59.875 kg)  08/06/11 132 lb (59.875 kg)  06/19/11 127 lb 6.4 oz (57.788 kg)  03/11/11 140 lb (63.504 kg)  02/20/11 141 lb 15.6 oz (64.4 kg)  02/11/11 139 lb (63.05 kg)  01/23/11 143 lb (64.864 kg)  09/05/10 140 lb (63.504 kg)  12/31/10 138 lb (62.596 kg)  10/22/10 136 lb (61.689 kg)  05/25/08 137 lb 8 oz (62.37 kg)     Usual Wt: 140 lb (5 months ago) % Usual Wt: 94%  Body mass index is 23.38 kg/(m^2).  WNL.  Food/Nutrition Related Hx: No nutrition problems identified on admission nutrition screening.  6% weight loss as compared to weight from January 2013.  Labs:  CMP     Component Value Date/Time   NA  149* 08/13/2011 0410   K 3.7 08/13/2011 0410   CL 108 08/13/2011 0410   CO2 29 08/13/2011 0410   GLUCOSE 163* 08/13/2011 0410   BUN 68* 08/13/2011 0410   CREATININE 2.38* 08/13/2011 0410   CALCIUM 8.8 08/13/2011 0410   PROT 5.2* 08/12/2011 2119   ALBUMIN 2.7* 08/13/2011 0410   AST 17 08/12/2011 2119   ALT 12 08/12/2011 2119   ALKPHOS 37* 08/12/2011 2119   BILITOT 0.9 08/12/2011 2119   GFRNONAA 19* 08/13/2011 0410   GFRAA 22* 08/13/2011 0410    CBG (last 3)   Basename 08/13/11 1142 08/13/11 0743 08/13/11 0411  GLUCAP 127* 170* 137*     Intake/Output Summary (Last 24 hours) at 08/13/11 1257 Last data filed at 08/13/11 1140  Gross per 24 hour  Intake    802 ml  Output    626 ml  Net    176 ml     Diet Order: NPO  Supplements/Tube Feeding:  None  IVF:    dextrose 5 % and 0.9% NaCl Last Rate: 50 mL/hr at 08/12/11 1959  DISCONTD: sodium  chloride Last Rate: 20 mL/hr (08/12/11 1102)    Estimated Nutritional Needs:   Kcal: 1350-1550 Protein: 72-84 grams Fluid: 1.4-1.6  Patient experienced a witnessed tonic clonic seizure on 6/17.  Neurology is now following.  Patient remains NPO due to seizure activity and post-op ileus.  RN reports patient has had a few small bowel movement smears.  Does not received HD, but has a fistula in place for initiation of HD in the near future.  Patient with fragile skin and multiple skin tears per RN.  Patient is at nutrition risk given weight loss of ~6% of usual weight in 5 months, fragile skin, and prolonged NPO status (since admission--7 days).  NUTRITION DIAGNOSIS: -Inadequate oral intake (NI-2.1).  Status: Ongoing  RELATED TO: altered GI function  AS EVIDENCED BY: NPO status  MONITORING/EVALUATION(Goals):  Goal:  Intake to meet 90-100% of estimated nutrition needs.  Monitor:  Diet advancement, labs, weight trend, PO intake.  EDUCATION NEEDS: -Education not appropriate at this time  INTERVENTION:  As bowel function improves, recommend advance diet as tolerated to Regular.  When diet advanced, will likely need PO supplements to help meet nutrition needs.  If unable to advance diet within the next 48 hours, consider TPN for nutrition support.  Dietitian #:  [phone]  DOCUMENTATION CODES Per approved criteria  -Not Applicable    Hettie Holstein 08/13/2011, 12:50 PM

## 2011-08-13 NOTE — Clinical Social Work Psychosocial (Signed)
     Clinical Social Work Department BRIEF PSYCHOSOCIAL ASSESSMENT 08/13/2011  Patient:  [name], [name]     Account Number:  [id number]     Admit date:  08/06/2011  Clinical Social Worker:  Dennison Bulla  Date/Time:  08/13/2011 03:30 PM  Referred by:  Physician  Date Referred:  08/13/2011 Referred for  SNF Placement   Other Referral:   Interview type:  Patient Other interview type:   Dtr and grandson present    PSYCHOSOCIAL DATA Living Status:  FAMILY Admitted from facility:   Level of care:   Primary support name:  Jasmine December Primary support relationship to patient:  CHILD, ADULT Degree of support available:   Strong    CURRENT CONCERNS Current Concerns  Post-Acute Placement   Other Concerns:    SOCIAL WORK ASSESSMENT / PLAN CSW received referral to assist with placement. CSW reviewed chart which did not have any PT evaluation. CSW spoke with bedside RN who reported patient was not ambulating. CSW met with patient at bedside. Family present. Pt agreeable to family being involved in assessment.    CSW introduced myself and explained role. CSW spoke with patient and family regarding living arrangements prior to admission. Patient reports that she lives with her son. Son works during the day. Dtr reports that at dc patient will have 24 hour because she will stay with patient when son is at work. CSW explained SNF option. CSW explained Medicare benefits and SNF process. CSW attempted to give family SNF list but family reports that patient will return home with them. Family is receptive to [facility] or outpatient therapy but is not willing for patient to go to SNF. CSW is signing off but can be consulted if patient or family chooses SNF option.   Assessment/plan status:  No Further Intervention Required Other assessment/ plan:   Information/referral to community resources:   Family politely declined SNF list    PATIENTS/FAMILYS RESPONSE TO PLAN OF CARE: Patient was confused at  times during the assessment. Family is supportive and is agreeable to patient returning home with 24 hour care at dc.

## 2011-08-13 NOTE — Progress Notes (Signed)
Portable EEG completed

## 2011-08-13 NOTE — Progress Notes (Signed)
Subjective: No recurrence of generalized seizure activity. Patient continues to be confused. According to family this is somewhat typical of what has happened when she has been hospitalized previously. History of previous seizures well and 2010.  Objective: Current vital signs: BP 163/71  Pulse 73  Temp 98.6 F (37 C) (Oral)  Resp 15  Ht 5\' 3"  (1.6 m)  Wt 59.875 kg (132 lb)  BMI 23.38 kg/m2  SpO2 99%  Neurologic Exam: Patient is awake and markedly confused. [name] moderately agitated as well. As no indication of active hallucinations. She does not follow simple commands well. Not corporative enough for evaluation of pupils. Extraocular movements were full and conjugate. No clear facial weakness. Patient was edentulous and speech was slightly dysarthric. She moved extremities equally. Strength was normal bilaterally.  Medications:  Scheduled:   . sodium chloride   Intravenous Once  . antiseptic oral rinse  15 mL Mouth Rinse q12n4p  . bisacodyl  10 mg Rectal Daily  . chlorhexidine  15 mL Mouth Rinse BID  . cloNIDine  0.2 mg Transdermal Weekly  . darbepoetin (ARANESP) injection - NON-DIALYSIS  60 mcg Subcutaneous Q Sat-1800  . hydrocortisone sod succinate (SOLU-CORTEF) injection  50 mg Intravenous Q8H  . insulin aspart  0-9 Units Subcutaneous Q4H  . levetiracetam  250 mg Intravenous Q12H  . lip balm  1 application Topical BID  . metoprolol  10 mg Intravenous Q6H  . pantoprazole (PROTONIX) IV  40 mg Intravenous QHS  . potassium chloride  10 mEq Intravenous Q1 Hr x 4  . DISCONTD: hydrocortisone sod succinate (SOLU-CORTEF) injection  100 mg Intravenous Q8H   Continuous:   . dextrose 5 % and 0.9% NaCl 50 mL/hr at 08/12/11 1959  . DISCONTD: sodium chloride 20 mL/hr (08/12/11 1102)   WRU:[id number], magic mouthwash, menthol-cetylpyridinium, ondansetron  Assessment/Plan: 1. Recurrent generalized seizure. 2. Altered mental status, most likely multifactorial in etiology,  including postictal state, displacement from familiar surroundings, as well as altered wake/sleep cycle. There is no documentation of underlying dementia.  Recommendations: 1. MRI of the brain without contrast is planned. 2. EEG as planned. 3. Continue Keppra at 250 mg every 12 hours.  C.R. Roseanne Reno, MD [facility] [phone]  08/13/2011  9:55 AM

## 2011-08-13 NOTE — Progress Notes (Signed)
  Echocardiogram 2D Echocardiogram has been performed.  Jorje Guild [facility] 08/13/2011, 11:24 AM

## 2011-08-13 NOTE — Clinical Documentation Improvement (Signed)
CHANGE MENTAL STATUS DOCUMENTATION CLARIFICATION   THIS DOCUMENT IS NOT A PERMANENT PART OF THE MEDICAL RECORD  TO RESPOND TO THE THIS QUERY, FOLLOW THE INSTRUCTIONS BELOW:  1. If needed, update documentation for the patient's encounter via the notes activity.  2. Access this query again and click edit on the In [employer].  3. After updating, or not, click F2 to complete all highlighted (required) fields concerning your review. Select "additional documentation in the medical record" OR "no additional documentation provided".  4. Click Sign note button.  5. The deficiency will fall out of your In Basket *Please let us know if you are not able to complete this workflow by phone or e-mail (listed below).         08/13/11  Dear Dr. [facility]  In an effort to better capture your patient's severity of illness, reflect appropriate length of stay and utilization of resources, a review of the patient medical record has revealed the following indicators.    Based on your clinical judgment, please clarify and document in a progress note and/or discharge summary the clinical condition associated with the following supporting information:  In responding to this query please exercise your independent judgment.  The fact that a query is asked, does not imply that any particular answer is desired or expected.  Possible Clinical Conditions  Encephalopathy (describe type if known)                       Hypertensive                       Metabolic                       Toxic                       Other  Drug induced confusion/ acute delirium  Acute delirium  Other Condition  Cannot Clinically Determine   Supporting Information: Risk Factors: Ventral Hernia repair with post op ileus "Suspected to be ATN from hypoperfusion"per notes "Uncontrolled hypertension" per note Recurrent seizure post op Acute renal Failure Elevated Sodium level NGT-NPO x 7days Narcotics for  pain  Signs & Symptoms: "Patient continues to be confused" per notes "Patient is awake and markedly confused. [name] moderately agitated as well" per notes "Altered mental status" per notes  Diagnostics: Lab: Results for NEILA, TEEM (MRN [id number]) as of 08/13/2011 14:33  Ref. Range 08/11/2011 10:57 08/12/2011 06:56 08/12/2011 16:46 08/12/2011 21:19 08/13/2011 04:10  Sodium Latest Range: 135-145 mEq/L 144 148 (H) 149 (H) 149 (H) 149 (H)   Results for CHARNELL, PEPLINSKI (MRN [id number]) as of 08/13/2011 14:33  Ref. Range 08/12/2011 16:58  pH, Arterial Latest Range: 7.350-7.400  7.413 (H)  pCO2 arterial Latest Range: 35.0-45.0 mmHg 38.8  pO2, Arterial Latest Range: 80.0-100.0 mmHg 123.0 (H)  Bicarbonate Latest Range: 20.0-24.0 mEq/L 24.3 (H)   Radiology: MRI:    Pending EEG:  Pending  Treatment: Continuous monitoring and assessment of patient Medicine to correct Uncontrolled HTN Monitoring labs and I & O  Reviewed: Query addressed  Thank You,     Rossie Muskrat RN, BSN Clinical Documentation Specialist Pager:  [phone] [email or web address]  Health Information Management [place]

## 2011-08-13 NOTE — Progress Notes (Signed)
Patient seen with JD, PA and plans reviewed. Will move to stepdown, check labs and Ct and obtain Neuro consult. Apparently had a similar episode several years ago, but no documentation of that available at time we are seeing her

## 2011-08-13 NOTE — Progress Notes (Signed)
Subjective:  Events noted.  Patient with possible seizure overnight and post ictal state.  Neurology on board. HCT unremarkable except white matter change.  Still with ileus and hypertension.  UOP recorded as less, renal function essentially the same.  Patient started on steroids and keppra.  Confused this AM  Objective Vital signs in last 24 hours: Filed Vitals:   08/12/11 2000 08/13/11 0000 08/13/11 0400 08/13/11 0739  BP: 183/87 178/84 163/71   Pulse: 82 77 73   Temp: 97.5 F (36.4 C) 98 F (36.7 C) 98.2 F (36.8 C) 98.6 F (37 C)  TempSrc: Oral Oral Oral Oral  Resp: 22 17 15    Height:      Weight:      SpO2: 98% 99% 99%    Weight change:   Intake/Output Summary (Last 24 hours) at 08/13/11 0856 Last data filed at 08/13/11 0742  Gross per 24 hour  Intake    802 ml  Output    801 ml  Net      1 ml   Labs: Basic Metabolic Panel:  Lab 08/13/11 1610 08/12/11 2119 08/12/11 1646 08/11/11 1057 08/10/11 0600  NA 149* 149* 149* -- --  K 3.7 3.6 3.7 -- --  CL 108 107 105 -- --  CO2 29 26 23  -- --  GLUCOSE 163* 167* 179* -- --  BUN 68* 65* 63* -- --  CREATININE 2.38* 2.23* 2.40* -- --  CALCIUM 8.8 9.0 8.9 -- --  ALB -- -- -- -- --  PHOS 3.7 -- -- 2.9 3.3   Liver Function Tests:  Lab 08/13/11 0410 08/12/11 2119 08/11/11 1057  AST -- 17 --  ALT -- 12 --  ALKPHOS -- 37* --  BILITOT -- 0.9 --  PROT -- 5.2* --  ALBUMIN 2.7* 2.9* 2.6*   No results found for this basename: LIPASE:3,AMYLASE:3 in the last 168 hours  Lab 08/12/11 1800  AMMONIA 13   CBC:  Lab 08/12/11 1646 08/12/11 0656 08/11/11 1057 08/10/11 0600 08/09/11 0610  WBC 9.8 7.8 8.4 -- --  NEUTROABS -- 6.7 7.0 -- --  HGB 9.7* 9.3* 9.2* -- --  HCT 31.1* 29.3* 30.1* -- --  MCV 99.0 97.7 97.7 99.1 97.9  PLT 132* 110* 102* -- --   Cardiac Enzymes:  Lab 08/13/11 0410 08/13/11 0208 08/12/11 2119 08/12/11 1848  CKTOTAL 39 43 39 38  CKMB 3.8 3.8 3.8 3.6  CKMBINDEX -- -- -- --  TROPONINI 0.64* 0.64* 0.55*  0.67*   CBG:  Lab 08/13/11 0743 08/13/11 0411 08/13/11 0037 08/12/11 1948 08/12/11 1649  GLUCAP 170* 137* 154* 137* 169*    Iron Studies:   Basename 08/10/11 1100  IRON 70  TIBC 193*  TRANSFERRIN --  FERRITIN 869*   Studies/Results: Ct Head Wo Contrast  08/12/2011  *RADIOLOGY REPORT*  Clinical Data: Found unresponsive, questionable seizure.  CT HEAD WITHOUT CONTRAST  Technique:  Contiguous axial images were obtained from the base of the skull through the vertex without contrast.  Comparison: 07/20/2008  Findings: Some  images are degraded by motion.  Allowing for this, mild prominence of the sulci, cisterns, and ventricles, in keeping with volume loss. There are subcortical and periventricular white matter hypodensities, a nonspecific finding most often seen with chronic microangiopathic changes.  There is no evidence for acute hemorrhage, overt hydrocephalus, mass lesion, or abnormal extra-axial fluid collection.  No definite CT evidence for acute cortical based (large artery) infarction.  Nonspecific left sphenoid chamber mucosal thickening.  Otherwise, the  visualized paranasal sinuses and mastoid air cells are predominately clear. NG tube present.  No displaced calvarial fracture.  Atherosclerotic vascular calcifications.  IMPRESSION: White matter changes and volume loss as above.  No definite acute intracranial abnormality.  Original Report Authenticated By: Waneta Martins, M.D.   Dg Abd 2 Views  08/11/2011  *RADIOLOGY REPORT*  Clinical Data: Abdominal pain.  Status post ventral hernia repair 5 days ago.  ABDOMEN - 2 VIEW  Comparison: 08/10/2011.  Findings: Again noted is some gas and stool scattered throughout the colon extending to the level of the distal rectum.  There is several dilated loops of small bowel, largest which measures up to 6.5 cm in diameter in the left upper quadrant of the abdomen.  No definite evidence of pneumoperitoneum on the decubitus views. Multiple air fluid  levels are noted on the decubitus views. Surgical staples are noted in the midline.  There are also clips in the right upper quadrant of the abdomen, likely from prior cholecystectomy.  A nasogastric tube is seen with tip and side port in the body of the stomach.   Status post right total hip arthroplasty.  IMPRESSION: 1.  Nonspecific bowel gas pattern again noted.  Findings are favored to represent a postoperative ileus, however, earlier partial small bowel obstruction would be difficult to entirely exclude. 2.  No pneumoperitoneum. 3.  Postoperative changes and support apparatus, as above.  Original Report Authenticated By: Florencia Reasons, M.D.   Medications: Infusions:    . dextrose 5 % and 0.9% NaCl 50 mL/hr at 08/12/11 1959  . DISCONTD: sodium chloride 20 mL/hr (08/12/11 1102)    Scheduled Medications:    . sodium chloride   Intravenous Once  . antiseptic oral rinse  15 mL Mouth Rinse q12n4p  . bisacodyl  10 mg Rectal Daily  . chlorhexidine  15 mL Mouth Rinse BID  . cloNIDine  0.2 mg Transdermal Weekly  . darbepoetin (ARANESP) injection - NON-DIALYSIS  60 mcg Subcutaneous Q Sat-1800  . hydrocortisone sod succinate (SOLU-CORTEF) injection  50 mg Intravenous Q8H  . insulin aspart  0-9 Units Subcutaneous Q4H  . levetiracetam  250 mg Intravenous Q12H  . lip balm  1 application Topical BID  . metoprolol  10 mg Intravenous Q6H  . pantoprazole (PROTONIX) IV  40 mg Intravenous QHS  . potassium chloride  10 mEq Intravenous Q1 Hr x 4  . DISCONTD: hydrocortisone sod succinate (SOLU-CORTEF) injection  100 mg Intravenous Q8H    have reviewed scheduled and prn medications.  Physical Exam: General: pale, anxious, alert, complaining of pain Heart: RRR Lungs: mostly clr Abdomen: distended, currently painful,  Extremities: dependant edema  I Assessment/ Plan: Pt is a 78 y.o. yo female who was admitted on 08/06/2011 with an incarcerated ventral hernia, s/p surgical repair.  complicated by  acute on chronic renal failure  Assessment/Plan: 1. ARF on Chronic kidney disease stage IV: due to ATN vs hypoperfusion. Creat stable but not great, adequate UOP.   I think patient would be a poor candidate for dialysis therapy due to dementia/anxiety. Hopefully renal function will continue to hang in there.  Are steroids needed? will probably worsen uremia  2. AMS- difficult to arouse over weekend, now s/p ?sz.  3. Hypertension: IV metoprolol increased + Catapres patch #2-  better.  Does not need any more IVF, not dry, will decrease 4. Anemia of chronic kidney disease: iron stores OK, on aranesp. 5. Metabolic bone disease: phos WNL, is NPO still 6. S/p ventral  hernia repair with ileus- still with NG output, per surgery.   7. Hypokalemia- repleted per surgery.    8. Dispo- unfortunately has had a very difficult time with this surgery.  Would consider discussions about code status    Lillyona Polasek A   08/13/2011,8:56 AM  LOS: 7 days

## 2011-08-13 NOTE — Consult Note (Addendum)
[facility]  Interim history: 79y/o female with DM 2, CKD, AOCD, HTN and ITP on low dose prednisone. She has had 1 seizure int he past with a negative EEG.  She was admitted by surgery on 6/11 and underwent a ventral hernia repair.  Medicine was consulted due to a witness tonic clonic seizure on 6/17. We subsequentially consulted Neuro who has started Keppra for her.   Subjective: [name] sleepy. When awakened, she is confused and unable to participate in a conversation.   Objective: Blood pressure 163/71, pulse 73, temperature 98.6 F (37 C), temperature source Oral, resp. rate 15, height 5\' 3"  (1.6 m), weight 59.875 kg (132 lb), SpO2 99.00%. Weight change:   Intake/Output Summary (Last 24 hours) at 08/13/11 1057 Last data filed at 08/13/11 1610  Gross per 24 hour  Intake    802 ml  Output    501 ml  Net    301 ml    Physical Exam: General appearance: slowed mentation Throat: dry oral mucosa Lungs: clear to auscultation bilaterally Heart: regular rate and rhythm, S1, S2 normal, no murmur Abdomen: soft, tender, no bowel sounds, non- distended Extremities: extremities normal, atraumatic, no cyanosis or edema Skin: Skin color, texture, turgor normal- abdominal incision healing well.   Lab Results:  Basename 08/13/11 0410 08/12/11 2119 08/11/11 1057  NA 149* 149* --  K 3.7 3.6 --  CL 108 107 --  CO2 29 26 --  GLUCOSE 163* 167* --  BUN 68* 65* --  CREATININE 2.38* 2.23* --  CALCIUM 8.8 9.0 --  MG -- -- --  PHOS 3.7 -- 2.9    Basename 08/13/11 0410 08/12/11 2119  AST -- 17  ALT -- 12  ALKPHOS -- 37*  BILITOT -- 0.9  PROT -- 5.2*  ALBUMIN 2.7* 2.9*   No results found for this basename: LIPASE:2,AMYLASE:2 in the last 72 hours  Basename 08/12/11 1646 08/12/11 0656 08/11/11 1057  WBC 9.8 7.8 --  NEUTROABS -- 6.7 7.0  HGB 9.7* 9.3* --  HCT 31.1* 29.3* --  MCV 99.0 97.7 --  PLT 132* 110* --    Basename 08/13/11 0410 08/13/11 0208 08/12/11 2119  CKTOTAL 39  43 39  CKMB 3.8 3.8 3.8  CKMBINDEX -- -- --  TROPONINI 0.64* 0.64* 0.55*   No components found with this basename: POCBNP:3 No results found for this basename: DDIMER:2 in the last 72 hours No results found for this basename: HGBA1C:2 in the last 72 hours No results found for this basename: CHOL:2,HDL:2,LDLCALC:2,TRIG:2,CHOLHDL:2,LDLDIRECT:2 in the last 72 hours  Basename 08/12/11 2119  TSH 0.361  T4TOTAL --  T3FREE --  THYROIDAB --    Basename 08/12/11 1646 08/10/11 1100  VITAMINB12 1934* --  FOLATE 10.6 --  FERRITIN -- 869*  TIBC -- 193*  IRON -- 70  RETICCTPCT -- --    Micro Results: Recent Results (from the past 240 hour(s))  URINE CULTURE     Status: Normal   Collection Time   08/04/11  2:53 PM      Component Value Range Status Comment   Specimen Description URINE, CLEAN CATCH   Final    Special Requests NONE   Final    Culture  Setup Time [id number]   Final    Colony Count >=100,000 COLONIES/ML   Final    Culture ESCHERICHIA COLI   Final    Report Status 08/06/2011 FINAL   Final    Organism ID, Bacteria ESCHERICHIA COLI   Final   SURGICAL PCR  SCREEN     Status: Normal   Collection Time   08/06/11  1:28 PM      Component Value Range Status Comment   MRSA, PCR NEGATIVE  NEGATIVE Final    Staphylococcus aureus NEGATIVE  NEGATIVE Final     Studies/Results: Ct Head Wo Contrast  08/12/2011  *RADIOLOGY REPORT*  Clinical Data: Found unresponsive, questionable seizure.  CT HEAD WITHOUT CONTRAST  Technique:  Contiguous axial images were obtained from the base of the skull through the vertex without contrast.  Comparison: 07/20/2008  Findings: Some  images are degraded by motion.  Allowing for this, mild prominence of the sulci, cisterns, and ventricles, in keeping with volume loss. There are subcortical and periventricular white matter hypodensities, a nonspecific finding most often seen with chronic microangiopathic changes.  There is no evidence for acute hemorrhage,  overt hydrocephalus, mass lesion, or abnormal extra-axial fluid collection.  No definite CT evidence for acute cortical based (large artery) infarction.  Nonspecific left sphenoid chamber mucosal thickening.  Otherwise, the visualized paranasal sinuses and mastoid air cells are predominately clear. NG tube present.  No displaced calvarial fracture.  Atherosclerotic vascular calcifications.  IMPRESSION: White matter changes and volume loss as above.  No definite acute intracranial abnormality.  Original Report Authenticated By: Waneta Martins, M.D.    Dg Abd 2 Views  08/11/2011  *RADIOLOGY REPORT*  Clinical Data: Abdominal pain.  Status post ventral hernia repair 5 days ago.  ABDOMEN - 2 VIEW  Comparison: 08/10/2011.  Findings: Again noted is some gas and stool scattered throughout the colon extending to the level of the distal rectum.  There is several dilated loops of small bowel, largest which measures up to 6.5 cm in diameter in the left upper quadrant of the abdomen.  No definite evidence of pneumoperitoneum on the decubitus views. Multiple air fluid levels are noted on the decubitus views. Surgical staples are noted in the midline.  There are also clips in the right upper quadrant of the abdomen, likely from prior cholecystectomy.  A nasogastric tube is seen with tip and side port in the body of the stomach.   Status post right total hip arthroplasty.  IMPRESSION: 1.  Nonspecific bowel gas pattern again noted.  Findings are favored to represent a postoperative ileus, however, earlier partial small bowel obstruction would be difficult to entirely exclude. 2.  No pneumoperitoneum. 3.  Postoperative changes and support apparatus, as above.  Original Report Authenticated By: Florencia Reasons, M.D.     Medications: Scheduled Meds:   . sodium chloride   Intravenous Once  . antiseptic oral rinse  15 mL Mouth Rinse q12n4p  . bisacodyl  10 mg Rectal Daily  . chlorhexidine  15 mL Mouth Rinse BID  .  cloNIDine  0.2 mg Transdermal Weekly  . darbepoetin (ARANESP) injection - NON-DIALYSIS  60 mcg Subcutaneous Q Sat-1800  . hydrocortisone sod succinate (SOLU-CORTEF) injection  50 mg Intravenous Q8H  . insulin aspart  0-9 Units Subcutaneous Q4H  . levetiracetam  250 mg Intravenous Q12H  . lip balm  1 application Topical BID  . metoprolol  10 mg Intravenous Q6H  . pantoprazole (PROTONIX) IV  40 mg Intravenous QHS  . potassium chloride  10 mEq Intravenous Q1 Hr x 4  . DISCONTD: hydrocortisone sod succinate (SOLU-CORTEF) injection  100 mg Intravenous Q8H   Continuous Infusions:   . dextrose 5 % and 0.9% NaCl 50 mL/hr at 08/12/11 1959  . DISCONTD: sodium chloride 20 mL/hr (08/12/11 1102)  PRN Meds:.hydrALAZINE, magic mouthwash, menthol-cetylpyridinium, ondansetron  Assessment/Plan: Generalized recurrent seizure Management per neuro  HTN Cont clonidine patch, metoprolol and Hydralazine IV She was previously on Atenolol and low dose PO clonidine Hopefully surgery can taper steroids which will help with BP.  ITP-  recommend tapering steroids to 25 BID today-   ARF on CKD Managed per nephro-suspected to be ATN from hypoperfusion - due to poor urine ouput this AM our team was called - Revonda Standard did order 500 cc NS bolus today.   Ventral Hernia repair with post op ileus Per surgery  Confusion- likely delirium due to hospital stay Not sure what her baseline is but apparently she had been lethargic a couple of days ago as well with improvement noted with Narcan?   DM2 Sensitive sliding scale while NPO   Crockett Rallo [phone] 08/13/2011, 10:57 AM  LOS: 7 days

## 2011-08-13 NOTE — Progress Notes (Signed)
7 Days Post-Op  Subjective: Awake and conversational this am, is unable to describe last pm events, unclear as to her current mental state as she will not/can not directly answer specific questions. States that she feels "awful" this morning. Daughter at bedside.  Objective: Vital signs in last 24 hours: Temp:  [97.5 F (36.4 C)-99.2 F (37.3 C)] 98.2 F (36.8 C) (06/18 0400) Pulse Rate:  [73-107] 73  (06/18 0400) Resp:  [15-22] 15  (06/18 0400) BP: (122-183)/(71-100) 163/71 mmHg (06/18 0400) SpO2:  [90 %-100 %] 99 % (06/18 0400) Last BM Date: 08/09/11  Intake/Output from previous day: 06/17 0701 - 06/18 0700 In: 702 [I.V.:600; IV Piggyback:102] Out: 726 [Urine:625; Emesis/NG output:100; Stool:1] Intake/Output this shift:    General appearance: alert, cooperative, appears older than stated age and slowed mentation Abdomen: remains soft, difussly tender this morning in llq to palpation and re-bound, hypoactive BS, non distended.  NG remains, draining dark bilious material.    Lab Results:   Basename 08/12/11 1646 08/12/11 0656  WBC 9.8 7.8  HGB 9.7* 9.3*  HCT 31.1* 29.3*  PLT 132* 110*   BMET  Basename 08/13/11 0410 08/12/11 2119  NA 149* 149*  K 3.7 3.6  CL 108 107  CO2 29 26  GLUCOSE 163* 167*  BUN 68* 65*  CREATININE 2.38* 2.23*  CALCIUM 8.8 9.0   PT/INR No results found for this basename: LABPROT:2,INR:2 in the last 72 hours ABG  Basename 08/12/11 1658  PHART 7.413*  HCO3 24.3*    Studies/Results: Ct Head Wo Contrast  08/12/2011  *RADIOLOGY REPORT*  Clinical Data: Found unresponsive, questionable seizure.  CT HEAD WITHOUT CONTRAST  Technique:  Contiguous axial images were obtained from the base of the skull through the vertex without contrast.  Comparison: 07/20/2008  Findings: Some  images are degraded by motion.  Allowing for this, mild prominence of the sulci, cisterns, and ventricles, in keeping with volume loss. There are subcortical and  periventricular white matter hypodensities, a nonspecific finding most often seen with chronic microangiopathic changes.  There is no evidence for acute hemorrhage, overt hydrocephalus, mass lesion, or abnormal extra-axial fluid collection.  No definite CT evidence for acute cortical based (large artery) infarction.  Nonspecific left sphenoid chamber mucosal thickening.  Otherwise, the visualized paranasal sinuses and mastoid air cells are predominately clear. NG tube present.  No displaced calvarial fracture.  Atherosclerotic vascular calcifications.  IMPRESSION: White matter changes and volume loss as above.  No definite acute intracranial abnormality.  Original Report Authenticated By: Waneta Martins, M.D.   Dg Abd 2 Views  08/11/2011  *RADIOLOGY REPORT*  Clinical Data: Abdominal pain.  Status post ventral hernia repair 5 days ago.  ABDOMEN - 2 VIEW  Comparison: 08/10/2011.  Findings: Again noted is some gas and stool scattered throughout the colon extending to the level of the distal rectum.  There is several dilated loops of small bowel, largest which measures up to 6.5 cm in diameter in the left upper quadrant of the abdomen.  No definite evidence of pneumoperitoneum on the decubitus views. Multiple air fluid levels are noted on the decubitus views. Surgical staples are noted in the midline.  There are also clips in the right upper quadrant of the abdomen, likely from prior cholecystectomy.  A nasogastric tube is seen with tip and side port in the body of the stomach.   Status post right total hip arthroplasty.  IMPRESSION: 1.  Nonspecific bowel gas pattern again noted.  Findings are favored to  represent a postoperative ileus, however, earlier partial small bowel obstruction would be difficult to entirely exclude. 2.  No pneumoperitoneum. 3.  Postoperative changes and support apparatus, as above.  Original Report Authenticated By: Florencia Reasons, M.D.    Anti-infectives: Anti-infectives      Start     Dose/Rate Route Frequency Ordered Stop   08/06/11 1400   ceFAZolin (ANCEF) IVPB 1 g/50 mL premix  Status:  Discontinued        1 g 100 mL/hr over 30 Minutes Intravenous 60 min pre-op 08/06/11 1313 08/06/11 1800          Assessment/Plan: s/p Procedure(s) (LRB): HERNIA REPAIR VENTRAL ADULT (N/A) 1. Agree with neurology's plan for MRI, Keppra. (their note is seen and appreciated) 2. Continue with all other orders for now.  LOS: 7 days    [name] 08/13/2011

## 2011-08-13 NOTE — Progress Notes (Signed)
Seems alert this am - denies abdominal pain and abdomen seems soft. Will recheck xrays in am if no progress in GI function. Dr Janee Morn anticipated slow return of bowel function - he did not do extensive adhesiolysis, so there could be other areas of partial sbo.

## 2011-08-14 DIAGNOSIS — E870 Hyperosmolality and hypernatremia: Secondary | ICD-10-CM

## 2011-08-14 DIAGNOSIS — N3000 Acute cystitis without hematuria: Secondary | ICD-10-CM

## 2011-08-14 DIAGNOSIS — R197 Diarrhea, unspecified: Secondary | ICD-10-CM

## 2011-08-14 DIAGNOSIS — G40109 Localization-related (focal) (partial) symptomatic epilepsy and epileptic syndromes with simple partial seizures, not intractable, without status epilepticus: Secondary | ICD-10-CM

## 2011-08-14 DIAGNOSIS — K7290 Hepatic failure, unspecified without coma: Secondary | ICD-10-CM

## 2011-08-14 DIAGNOSIS — R569 Unspecified convulsions: Secondary | ICD-10-CM

## 2011-08-14 LAB — GLUCOSE, CAPILLARY
Glucose-Capillary: 123 mg/dL — ABNORMAL HIGH (ref 70–99)
Glucose-Capillary: 120 mg/dL — ABNORMAL HIGH (ref 70–99)
Glucose-Capillary: 189 mg/dL — ABNORMAL HIGH (ref 70–99)
Glucose-Capillary: 150 mg/dL — ABNORMAL HIGH (ref 70–99)

## 2011-08-14 LAB — URINALYSIS, ROUTINE W REFLEX MICROSCOPIC
Nitrite: NEGATIVE
Specific Gravity, Urine: 1.012 (ref 1.005–1.030)
Urobilinogen, UA: 0.2 mg/dL (ref 0.0–1.0)

## 2011-08-14 LAB — URINE MICROSCOPIC-ADD ON

## 2011-08-14 LAB — PHENYTOIN LEVEL, TOTAL: Phenytoin Lvl: 13.6 ug/mL (ref 10.0–20.0)

## 2011-08-14 MED ORDER — CLONIDINE HCL 0.3 MG/24HR TD PTWK
0.3000 mg | MEDICATED_PATCH | TRANSDERMAL | Status: DC
  Administered 2011-08-14: 0.3 mg via TRANSDERMAL
  Filled 2011-08-14: qty 1

## 2011-08-14 MED ORDER — DEXTROSE-NACL 5-0.45 % IV SOLN: INTRAVENOUS | Status: DC

## 2011-08-14 MED ORDER — SODIUM CHLORIDE 0.9 % IJ SOLN
INTRAMUSCULAR | Status: AC
  Administered 2011-08-14: 10 mL
  Filled 2011-08-14: qty 10

## 2011-08-14 MED ORDER — PHENYTOIN SODIUM 50 MG/ML IJ SOLN
100.0000 mg | Freq: Three times a day (TID) | INTRAMUSCULAR | Status: DC
  Administered 2011-08-14 – 2011-08-17 (×9): 100 mg via INTRAVENOUS
  Filled 2011-08-14 (×12): qty 2

## 2011-08-14 MED ORDER — FUROSEMIDE 10 MG/ML IJ SOLN
40.0000 mg | Freq: Two times a day (BID) | INTRAMUSCULAR | Status: DC
  Administered 2011-08-14 – 2011-08-16 (×6): 40 mg via INTRAVENOUS
  Filled 2011-08-14 (×8): qty 4

## 2011-08-14 MED ORDER — SODIUM CHLORIDE 0.9 % IV SOLN
700.0000 mg | Freq: Once | INTRAVENOUS | Status: DC
  Filled 2011-08-14: qty 14

## 2011-08-14 MED ORDER — DEXTROSE 5 % IV SOLN
1.0000 g | INTRAVENOUS | Status: DC
  Administered 2011-08-14 – 2011-08-15 (×2): 1 g via INTRAVENOUS
  Filled 2011-08-14 (×3): qty 10

## 2011-08-14 MED ORDER — HYDRALAZINE HCL 20 MG/ML IJ SOLN
10.0000 mg | Freq: Four times a day (QID) | INTRAMUSCULAR | Status: DC
  Administered 2011-08-14 – 2011-08-17 (×11): 10 mg via INTRAVENOUS
  Filled 2011-08-14 (×3): qty 0.5
  Filled 2011-08-14: qty 1
  Filled 2011-08-14 (×9): qty 0.5

## 2011-08-14 MED ORDER — HYDROCORTISONE SOD SUCCINATE 100 MG IJ SOLR
25.0000 mg | Freq: Two times a day (BID) | INTRAMUSCULAR | Status: DC
  Administered 2011-08-14 – 2011-08-15 (×2): 25 mg via INTRAVENOUS
  Filled 2011-08-14 (×3): qty 0.5

## 2011-08-14 MED ORDER — SODIUM CHLORIDE 0.9 % IV SOLN
950.0000 mg | Freq: Once | INTRAVENOUS | Status: AC
  Administered 2011-08-14: 950 mg via INTRAVENOUS
  Filled 2011-08-14: qty 19

## 2011-08-14 NOTE — Progress Notes (Signed)
Patient ID: [name], female   DOB: 1932/07/19, 78 y.o.   MRN: [id number] 8 Days Post-Op  Subjective: Much more somnolent this am, unclear as to her current mental state. Daughter at bedside. Neurology and Nephrology, reviewing chart and examining patient as well.  Objective: Vital signs in last 24 hours: Temp:  [97.9 F (36.6 C)-98.9 F (37.2 C)] 98.9 F (37.2 C) (06/19 0724) Pulse Rate:  [81-91] 91  (06/19 0444) Resp:  [15-22] 18  (06/19 0444) BP: (170-184)/(71-81) 174/78 mmHg (06/19 0724) SpO2:  [98 %-99 %] 99 % (06/19 0444) Last BM Date: 08/13/11  Intake/Output from previous day: 06/18 0701 - 06/19 0700 In: 170 [I.V.:170] Out: 575 [Urine:525; Emesis/NG output:50] Intake/Output this shift: Total I/O In: 10 [I.V.:10] Out: 125 [Urine:125]  General appearance:somulent,not arousable  Abdomen: remains soft, hypoactive BS, non distended.  NG remains, draining dark bilious material.  Chest:CTA Cardiac: RRR, no murmurs, rubs, gallops.    Lab Results:   Basename 08/12/11 1646 08/12/11 0656  WBC 9.8 7.8  HGB 9.7* 9.3*  HCT 31.1* 29.3*  PLT 132* 110*   BMET  Basename 08/13/11 0410 08/12/11 2119  NA 149* 149*  K 3.7 3.6  CL 108 107  CO2 29 26  GLUCOSE 163* 167*  BUN 68* 65*  CREATININE 2.38* 2.23*  CALCIUM 8.8 9.0   PT/INR No results found for this basename: LABPROT:2,INR:2 in the last 72 hours ABG  Basename 08/12/11 1658  PHART 7.413*  HCO3 24.3*    Studies/Results: Ct Head Wo Contrast  08/12/2011  *RADIOLOGY REPORT*  Clinical Data: Found unresponsive, questionable seizure.  CT HEAD WITHOUT CONTRAST  Technique:  Contiguous axial images were obtained from the base of the skull through the vertex without contrast.  Comparison: 07/20/2008  Findings: Some  images are degraded by motion.  Allowing for this, mild prominence of the sulci, cisterns, and ventricles, in keeping with volume loss. There are subcortical and periventricular white matter hypodensities, a  nonspecific finding most often seen with chronic microangiopathic changes.  There is no evidence for acute hemorrhage, overt hydrocephalus, mass lesion, or abnormal extra-axial fluid collection.  No definite CT evidence for acute cortical based (large artery) infarction.  Nonspecific left sphenoid chamber mucosal thickening.  Otherwise, the visualized paranasal sinuses and mastoid air cells are predominately clear. NG tube present.  No displaced calvarial fracture.  Atherosclerotic vascular calcifications.  IMPRESSION: White matter changes and volume loss as above.  No definite acute intracranial abnormality.  Original Report Authenticated By: Waneta Martins, M.D.   Mr Brain Wo Contrast  08/13/2011  *RADIOLOGY REPORT*  Clinical Data: Post hernia repair.  Renal failure.  Confusion after surgery.  Questionable seizure.  MRI HEAD WITHOUT CONTRAST  Technique:  Multiplanar, multiecho pulse sequences of the brain and surrounding structures were obtained according to standard protocol without intravenous contrast.  Comparison: 08/12/2011 CT.  07/15/2008 MR.  Findings: Symmetric altered signal intensity involving the cerebellum bilaterally, mid to posterior temporal lobes bilaterally, occipital lobes bilaterally more notable on the right and the medial aspect of the parietal lobes.  The distribution and history are suggestive of posterior reversible encephalopathy syndrome. Other causes such as that related to infection, metabolic abnormality and or inflammatory process felt to be less likely considerations.  Global atrophy without hydrocephalus.  Moderate small vessel disease type changes.  No acute thrombotic infarct.  No intracranial hemorrhage.  No intracranial mass lesion detected on this unenhanced exam.  Abnormal appearance of the left vertebral artery which may be occluded.  Paranasal sinus mucosal thickening.  Partially empty sella incidentally noted.  IMPRESSION: Findings suggestive of posterior reversible  encephalopathy syndrome as detailed above.  This has been made a PRA call report utilizing dashboard call feature.  Original Report Authenticated By: Fuller Canada, M.D.    Anti-infectives: Anti-infectives     Start     Dose/Rate Route Frequency Ordered Stop   08/06/11 1400   ceFAZolin (ANCEF) IVPB 1 g/50 mL premix  Status:  Discontinued        1 g 100 mL/hr over 30 Minutes Intravenous 60 min pre-op 08/06/11 1313 08/06/11 1800          Assessment/Plan: s/p Procedure(s) (LRB): HERNIA REPAIR VENTRAL ADULT (N/A) 1. Await Neurology and Nephrology's recommendations. 2. Will begin Hydralize IV for BP management (Neurology's recommendation target 140/70's)  LOS: 8 days    [name] 08/14/2011

## 2011-08-14 NOTE — Progress Notes (Signed)
Subjective:  Still confused.  EEG and MRI suggestive of encephalopathy ?hypertensive vs metabolic.  Objective Vital signs in last 24 hours: Filed Vitals:   08/13/11 2113 08/14/11 0012 08/14/11 0444 08/14/11 0724  BP: 170/81 173/77 176/81 174/78  Pulse: 81 87 91   Temp:  97.9 F (36.6 C) 98.5 F (36.9 C) 98.9 F (37.2 C)  TempSrc:  Oral Oral Oral  Resp: 15 22 18    Height:      Weight:      SpO2: 98% 99% 99%    Weight change:   Intake/Output Summary (Last 24 hours) at 08/14/11 0915 Last data filed at 08/14/11 0725  Gross per 24 hour  Intake    130 ml  Output    625 ml  Net   -495 ml   Labs: Basic Metabolic Panel:  Lab 08/13/11 1610 08/12/11 2119 08/12/11 1646 08/11/11 1057 08/10/11 0600  NA 149* 149* 149* -- --  K 3.7 3.6 3.7 -- --  CL 108 107 105 -- --  CO2 29 26 23  -- --  GLUCOSE 163* 167* 179* -- --  BUN 68* 65* 63* -- --  CREATININE 2.38* 2.23* 2.40* -- --  CALCIUM 8.8 9.0 8.9 -- --  ALB -- -- -- -- --  PHOS 3.7 -- -- 2.9 3.3   Liver Function Tests:  Lab 08/13/11 0410 08/12/11 2119 08/11/11 1057  AST -- 17 --  ALT -- 12 --  ALKPHOS -- 37* --  BILITOT -- 0.9 --  PROT -- 5.2* --  ALBUMIN 2.7* 2.9* 2.6*   No results found for this basename: LIPASE:3,AMYLASE:3 in the last 168 hours  Lab 08/12/11 1800  AMMONIA 13   CBC:  Lab 08/12/11 1646 08/12/11 0656 08/11/11 1057 08/10/11 0600 08/09/11 0610  WBC 9.8 7.8 8.4 -- --  NEUTROABS -- 6.7 7.0 -- --  HGB 9.7* 9.3* 9.2* -- --  HCT 31.1* 29.3* 30.1* -- --  MCV 99.0 97.7 97.7 99.1 97.9  PLT 132* 110* 102* -- --   Cardiac Enzymes:  Lab 08/13/11 1412 08/13/11 0410 08/13/11 0208 08/12/11 2119 08/12/11 1848  CKTOTAL 37 39 43 39 38  CKMB 3.5 3.8 3.8 3.8 3.6  CKMBINDEX -- -- -- -- --  TROPONINI 0.46* 0.64* 0.64* 0.55* 0.67*   CBG:  Lab 08/14/11 0727 08/14/11 0442 08/14/11 0011 08/13/11 2004 08/13/11 1514  GLUCAP 120* 123* 147* 125* 111*    Iron Studies:  No results found for this basename:  IRON,TIBC,TRANSFERRIN,FERRITIN in the last 72 hours Studies/Results: Ct Head Wo Contrast  08/12/2011  *RADIOLOGY REPORT*  Clinical Data: Found unresponsive, questionable seizure.  CT HEAD WITHOUT CONTRAST  Technique:  Contiguous axial images were obtained from the base of the skull through the vertex without contrast.  Comparison: 07/20/2008  Findings: Some  images are degraded by motion.  Allowing for this, mild prominence of the sulci, cisterns, and ventricles, in keeping with volume loss. There are subcortical and periventricular white matter hypodensities, a nonspecific finding most often seen with chronic microangiopathic changes.  There is no evidence for acute hemorrhage, overt hydrocephalus, mass lesion, or abnormal extra-axial fluid collection.  No definite CT evidence for acute cortical based (large artery) infarction.  Nonspecific left sphenoid chamber mucosal thickening.  Otherwise, the visualized paranasal sinuses and mastoid air cells are predominately clear. NG tube present.  No displaced calvarial fracture.  Atherosclerotic vascular calcifications.  IMPRESSION: White matter changes and volume loss as above.  No definite acute intracranial abnormality.  Original Report Authenticated By:  Waneta Martins, M.D.   Mr Brain Wo Contrast  08/13/2011  *RADIOLOGY REPORT*  Clinical Data: Post hernia repair.  Renal failure.  Confusion after surgery.  Questionable seizure.  MRI HEAD WITHOUT CONTRAST  Technique:  Multiplanar, multiecho pulse sequences of the brain and surrounding structures were obtained according to standard protocol without intravenous contrast.  Comparison: 08/12/2011 CT.  07/15/2008 MR.  Findings: Symmetric altered signal intensity involving the cerebellum bilaterally, mid to posterior temporal lobes bilaterally, occipital lobes bilaterally more notable on the right and the medial aspect of the parietal lobes.  The distribution and history are suggestive of posterior reversible  encephalopathy syndrome. Other causes such as that related to infection, metabolic abnormality and or inflammatory process felt to be less likely considerations.  Global atrophy without hydrocephalus.  Moderate small vessel disease type changes.  No acute thrombotic infarct.  No intracranial hemorrhage.  No intracranial mass lesion detected on this unenhanced exam.  Abnormal appearance of the left vertebral artery which may be occluded.  Paranasal sinus mucosal thickening.  Partially empty sella incidentally noted.  IMPRESSION: Findings suggestive of posterior reversible encephalopathy syndrome as detailed above.  This has been made a PRA call report utilizing dashboard call feature.  Original Report Authenticated By: Fuller Canada, M.D.   Medications: Infusions:    . dextrose 5 % and 0.9% NaCl 10 mL/hr (08/13/11 0909)    Scheduled Medications:    . antiseptic oral rinse  15 mL Mouth Rinse q12n4p  . bisacodyl  10 mg Rectal Daily  . chlorhexidine  15 mL Mouth Rinse BID  . cloNIDine  0.2 mg Transdermal Weekly  . darbepoetin (ARANESP) injection - NON-DIALYSIS  60 mcg Subcutaneous Q Sat-1800  . hydrocortisone sod succinate (SOLU-CORTEF) injection  50 mg Intravenous Q8H  . insulin aspart  0-9 Units Subcutaneous Q4H  . levetiracetam  250 mg Intravenous Q12H  . lip balm  1 application Topical BID  . metoprolol  10 mg Intravenous Q6H  . pantoprazole (PROTONIX) IV  40 mg Intravenous QHS    have reviewed scheduled and prn medications.  Physical Exam: General: somnolent, but arousable HEART- RRR ungs: mostly clr Abdomen: distended, currently painful,  Extremities: dependant edema  I Assessment/ Plan: Pt is a 78 y.o. yo female who was admitted on 08/06/2011 with an incarcerated ventral hernia, s/p surgical repair.  complicated by acute on chronic renal failure  Assessment/Plan: 1. ARF on Chronic kidney disease stage IV: due to ATN vs hypoperfusion. Creat stable but not great, adequate UOP.    I think patient would be a poor candidate for dialysis therapy due to dementia/anxiety. Hopefully renal function will continue to hang in there.   2. AMS- difficult to arouse over weekend, now s/p ?sz/encephalopathy.  Neuro on board 3. Hypertension: Really does need better control.  I will increase clonidine patch and add hydralazine scheduled.  Lasix may help also, will add.  4. Anemia- stable, on aranesp 5. Metabolic bone disease: phos WNL, is NPO still 6. S/p ventral hernia repair with ileus- still with NG output, per surgery.   7. Hypokalemia- repleted per surgery.    8. Dispo- unfortunately has had a very difficult time with this surgery and complicationsconsider discussions about code status    Shamir Tuzzolino A   08/14/2011,9:15 AM  LOS: 8 days

## 2011-08-14 NOTE — Procedures (Signed)
EEG NUMBER:  13 - [id number].  This routine EEG was requested in this 78 year old woman, who had a possible seizure with the sudden onset of unresponsiveness and convulsions.  She had a similar episode in 2010.  Her medications include levetiracetam.  The EEG was done with the patient awake and confused.  During periods of maximal wakefulness, background activities were characterized by low-to- medium amplitude theta activities over the right hemisphere.  Over the left hemisphere in the left temporal chain, similar activities were seen; however, in the left parasagittal and midline chain, there were higher amplitude of arrhythmic delta activities seen.  These appear to be at maximum negativity in the left central and left vertex regions. At times, there were frequent sharp waves seen that were arrhythmic, but at times evolved to be pseudoperiodic in the same location.  At times, these sharp waves had a wider field involving the right parasagittal chain as well as the left temporal chain.    No normal activities of sleep were seen.  Photic stimulation did not produce a driving response. Hyperventilation was not performed.  CLINICAL INTERPRETATION:  This routine EEG done with the patient with decreased responsiveness is abnormal.  Background activities are too slow suggesting a moderate encephalopathy of nonspecific etiology.  The delta activities mixed with frequent sharp waves over the left central and vertex region are potential source of seizures.  While at times, the sharp waves become more frequent, I do not favor that these represent electrographic seizures.  If her clinical condition does not improve; however, a repeat EEG may add additional clinical information.          ______________________________ Denton Meek, MD    [id number] D:  08/13/2011 23:58:05  T:  08/14/2011 00:35:28  Job #:  [id number]

## 2011-08-14 NOTE — Progress Notes (Signed)
Abdomen is soft this am and not tender. Patient passed gas and a small BM, so will dc ng

## 2011-08-14 NOTE — Progress Notes (Addendum)
MEDICATION RELATED CONSULT NOTE - INITIAL   Pharmacy Consult for Phenytoin Indication: Change from Keppra for EEG/MRI (+) for ?encephalopathy vs sz  No Known Allergies Patient Measurements: Height: 5\' 3"  (160 cm) Weight: 132 lb (59.875 kg) IBW/kg (Calculated) : 52.4  Vital Signs: Temp: 98.9 F (37.2 C) (06/19 0724) Temp src: Oral (06/19 0724) BP: 174/78 mmHg (06/19 0724) Pulse Rate: 91  (06/19 0444) Labs:  Basename 08/13/11 0410 08/13/11 0401 08/12/11 2119 08/12/11 1646 08/12/11 0656 08/11/11 1057  WBC -- -- -- 9.8 7.8 8.4  HGB -- -- -- 9.7* 9.3* 9.2*  HCT -- -- -- 31.1* 29.3* 30.1*  PLT -- -- -- 132* 110* 102*  APTT -- -- -- -- -- --  CREATININE 2.38* -- 2.23* 2.40* -- --  LABCREA -- -- -- -- -- --  CREATININE 2.38* -- 2.23* 2.40* -- --  CREAT24HRUR -- -- -- -- -- --  MG -- 2.4 -- -- -- --  PHOS 3.7 -- -- -- -- 2.9  ALBUMIN 2.7* -- 2.9* -- -- 2.6*  PROT -- -- 5.2* -- -- --  ALBUMIN 2.7* -- 2.9* -- -- 2.6*  AST -- -- 17 -- -- --  ALT -- -- 12 -- -- --  ALKPHOS -- -- 37* -- -- --  BILITOT -- -- 0.9 -- -- --  BILIDIR -- -- -- -- -- --  IBILI -- -- -- -- -- --   Estimated Creatinine Clearance: 16.4 ml/min (by C-G formula based on Cr of 2.38).  Assessment: 35 YOF with EEG and MRI suggestive of encephalopathy s/p elevated ventral hernia repair on 08/06/11 followed by sudden onset unresponsiveness and convulsions on 08/12/11. Patient on Keppra PTA for similar episode in 2010. Keppra had been off prior to episode and was resumed 6/17 after episode. Neurology now stopping Keppra and changing to Phenytoin.   LFTs wnl on 6/17. Albumin 2.7.   Goal of Therapy:  Phenytoin level 10-20 mcg/mL  Plan:  Phenytoin 700mg  ordered by neurology which only equals ~11 mg/kg load. Normal load is 15-20mg /kg. Will add another 250mg  x1 for total of 950mg  or 16mg /kg load.  Continue Phenytoin 100mg  IV q8h.  Will follow-up level 2 hrs after load.  Plan to recheck level at [facility] in 5-7 days.    Link Snuffer, PharmD, BCPS Clinical Pharmacist [phone] 08/14/2011,9:44 AM   Addendum: 08/14/2011, 2:34 PM 2 hr Phenytoin load appropriate- 13.6 (corrects up to 20).  No further load needed.  Continue Phenytoin 100mg  IV q8h.  Check Phenytoin level and albumin in 5-7 days at [facility].  Link Snuffer, PharmD, BCPS Clinical Pharmacist [phone]

## 2011-08-14 NOTE — Progress Notes (Signed)
[facility] Progress Note  This is a resident note. Please see below for attending addendum for additional details.  Subjective:   Patient unable to provide history, appears in no acute distress. No overnight events per RN and daughter at bedside. Of note, in discussion with the patient's daughter, it seems that at baseline, patient is able to complete all ADLs including cooking, cleaning, dressing, bathing independently. As well, she is able to perform IADLs independently as well including balancing her check book. She uses cane for ambulation, and very infrequently has falls (approximately 1x per year).  Objective:    Vital Signs:   Temp:  [97.9 F (36.6 C)-98.9 F (37.2 C)] 98.9 F (37.2 C) (06/19 0724) Pulse Rate:  [81-91] 91  (06/19 0444) Resp:  [15-22] 18  (06/19 0444) BP: (170-184)/(71-81) 174/78 mmHg (06/19 0724) SpO2:  [98 %-99 %] 99 % (06/19 0444) Last BM Date: 08/13/11  Intake/Output:  06/18 0701 - 06/19 0700 In: 170 [I.V.:170] Out: 575 [Urine:525; Emesis/NG output:50]   Physical Exam: General Exam: Lungs:  Normal respiratory effort. Clear to auscultation BL without crackles or wheezes.  Heart: RRR. S1 and S2 normal without gallop, rubs. (+) systolic murmur.  Abdomen:  BS +. Soft, Nondistended, non-tender.  No masses or organomegaly.  Extremities: No pretibial edema.     Neurologic Exam:  Mental Status: In no acute distress; lethargic, not oriented to person, place, or time. Unable to recount location despite being given options. Difficulty with following commands.  Cranial Nerves:   II: Does not blink to confrontation bilaterally.   III/IV/VI: Extraocular movements intact grossly.  Pupils reactive bilaterally.  V/VII: Smile symmetric.  VIII: Grossly intact.  IX/X: Did not assess gag reflex.  XI: Unable to assess bilateral shoulder shrug as patient unable to follow commands.  XII: Unable to assess tongue extension as patient unable to follow  commands.  Motor:  BL Upper extremities 3+/5, LLE 2/5, RLE 3/5.  Sensory:  Reactive to noxious stimuli.  DTRs: 1+ BL upper extremities, 1+ BL knees, absent at the BL ankles  Plantars:  Downgoing right, not moving on left.    Labs: Basic Metabolic Panel:  Lab 08/13/11 1610 08/13/11 0401 08/12/11 2119 08/12/11 1646 08/12/11 0656 08/11/11 1057 08/10/11 0600 08/09/11 0610  NA 149* -- 149* 149* 148* 144144 -- --  K 3.7 -- 3.6 3.7 3.1* 3.3*3.3* -- --  CL 108 -- 107 105 105 100101 -- --  CO2 29 -- 26 23 27  2829 -- --  GLUCOSE 163* -- 167* 179* 133* 146*147* -- --  BUN 68* -- 65* 63* 58* 50*49* -- --  CREATININE 2.38* -- 2.23* 2.40* 2.20* 1.99*1.99* -- --  CALCIUM 8.8 -- 9.0 8.9 -- -- -- --  MG -- 2.4 -- -- -- -- -- --  PHOS 3.7 -- -- -- -- 2.9 3.3 3.6    Liver Function Tests:  Lab 08/13/11 0410 08/12/11 2119 08/11/11 1057 08/10/11 0600 08/09/11 0610  AST -- 17 -- -- --  ALT -- 12 -- -- --  ALKPHOS -- 37* -- -- --  BILITOT -- 0.9 -- -- --  PROT -- 5.2* -- -- --  ALBUMIN 2.7* 2.9* 2.6* 2.7* 2.7*     Lab 08/12/11 1800  AMMONIA 13    CBC:  Lab 08/12/11 1646 08/12/11 0656 08/11/11 1057 08/10/11 0600 08/09/11 0610  WBC 9.8 7.8 8.4 9.3 11.1*  NEUTROABS -- 6.7 7.0 -- --  HGB 9.7* 9.3* 9.2* 9.5* 10.3*  HCT 31.1* 29.3* 30.1* 31.3*  32.7*  MCV 99.0 97.7 97.7 99.1 97.9  PLT 132* 110* 102* 101* 88*    Cardiac Enzymes:  Lab 08/13/11 1412 08/13/11 0410 08/13/11 0208 08/12/11 2119 08/12/11 1848  CKTOTAL 37 39 43 39 38  CKMB 3.5 3.8 3.8 3.8 3.6  CKMBINDEX -- -- -- -- --  TROPONINI 0.46* 0.64* 0.64* 0.55* 0.67*    CBG:  Lab 08/14/11 0727 08/14/11 0442 08/14/11 0011 08/13/11 2004 08/13/11 1514  GLUCAP 120* 123* 147* 125* 111*    Microbiology: Results for orders placed during the hospital encounter of 08/06/11  SURGICAL PCR SCREEN     Status: Normal   Collection Time   08/06/11  1:28 PM      Component Value Range Status Comment   MRSA, PCR NEGATIVE  NEGATIVE Final     Staphylococcus aureus NEGATIVE  NEGATIVE Final     Imaging:  Ct Head Wo Contrast (08/12/2011) - White matter changes and volume loss as above.  No definite acute intracranial abnormality.  Original Report Authenticated By: Waneta Martins, M.D.   Mr Brain Wo Contrast (08/13/2011) - Findings suggestive of posterior reversible encephalopathy syndrome as detailed above.  This has been made a PRA call report utilizing dashboard call feature.  Original Report Authenticated By: Fuller Canada, M.D.   EEG (08/14/2011) - This routine EEG done with the patient with decreased responsiveness is abnormal. Background activities are too slow suggesting a moderate encephalopathy of nonspecific etiology. In the delta activities mixed with frequent sharp waves over the left central and vertex region are potential source of seizures. While at times, the sharp waves become more frequent, I do not favor that these represent electrographic seizures. If her clinical condition does not improve; however, a repeat EEG may add additional clinical information.    Medications:    Infusions:    . dextrose 5 % and 0.9% NaCl 10 mL/hr (08/13/11 0909)    Scheduled Medications:    . antiseptic oral rinse  15 mL Mouth Rinse q12n4p  . bisacodyl  10 mg Rectal Daily  . chlorhexidine  15 mL Mouth Rinse BID  . cloNIDine  0.2 mg Transdermal Weekly  . darbepoetin (ARANESP) injection - NON-DIALYSIS  60 mcg Subcutaneous Q Sat-1800  . hydrocortisone sod succinate (SOLU-CORTEF) injection  50 mg Intravenous Q8H  . insulin aspart  0-9 Units Subcutaneous Q4H  . levetiracetam  250 mg Intravenous Q12H  . lip balm  1 application Topical BID  . metoprolol  10 mg Intravenous Q6H  . pantoprazole (PROTONIX) IV  40 mg Intravenous QHS    PRN Medications: hydrALAZINE, magic mouthwash, menthol-cetylpyridinium, ondansetron   Assessment/ Plan:    Pt is a 78 y.o. yo female with a PMHx of DMII (last A1c 7.1 in 2011), B12 deficiency,  CKD IV who was admitted on 08/06/2011 for elective ventral hernia repair. Neurology service is consulted for evaluation and treatment of recurrent generalized seizure (with a witnessed tonic clonic seizure on 08/12/2011).  Patient was started on Keppra therapy (06/17).  Assessment: 1) Recurrent Generalized Seizure - likely related to #2. Currently on Keppra therapy without recurrence of seizure activity. 2) Posterior Reversible Encephalopathy Syndrome - noted on MRI brain this AM. PRES is likely the main factor contributing towards the patient's AMS as well as recent seizure activity. It is likely secondary to elevated blood pressures with persistent elevation of SBP to 170s mmHg. 3) CKD IV - LE vascular access in place several months ago at recommendation of primary nephrologist, Dr. Eliott Nine, in  anticipation of future need for HD. Renal does not feel acute HD needs at this point.   Plan: 1) Recurrent generalized seizure  - Will discontinue keppra (given compromised renal function). - Start Dilantin. To be provided dilantin load, subsequent dilantin level 2 hours later. Then, to be continued on dilantin maintenance.  - Pharmacy consult for dilantin.  2) Posterior Reversible Encephalopathy Syndrome - main goal is improved blood pressure control with goal of 140/70s mmHg. - Recommend to start hydralazine IV. - As well, clonidine was increased and lasix added per nephrology team.   Length of Stay: 8 days   Patient history and plan of care reviewed with attending, Dr. Thana Farr.   Signed: Johnette Abraham, D.ODonnajean Lopes, Internal Medicine Resident Pager: [phone] (7AM-5PM) 08/14/2011, 8:53 AM    Patient with confusion, likely visual issues and seizures clinically.  Imaging shows evidence of PRES.  Patient hypertensive.  With clinical presentation there is some urgency to BP control.  Agree with use of IV antihypertensives that can be adjusted frequently as necessary.  May consider  beta blocker use as well.  With control of BP expect neurologic issues to resolve.   Based on comorbidities, Dilantin soul be a better AED to use for seizure prophylaxis.    Patient seen and examined. I agree with the above.  Thana Farr, MD [facility] [phone]  08/14/2011  2:18 PM

## 2011-08-14 NOTE — Consult Note (Signed)
[facility] [place] TEAM 1 - Stepdown/ICU TEAM  Interim history: 78y/o female with DM 2, CKD, AOCD, HTN and ITP on low dose prednisone. She has had 1 seizure in  the past with a negative EEG.  She was admitted by surgery on 6/11 and underwent a ventral hernia repair.  Medicine was consulted due to a witnessed tonic clonic seizure on 6/17. We subsequentially consulted Neuro who has started Keppra for her.   Subjective: The patient remains quite lethargic.  She will open her eyes to stimulation, but will not answer questions.  Her daughter is updated at the bedside.    Objective: Blood pressure 174/78, pulse 91, temperature 98 F (36.7 C), temperature source Oral, resp. rate 18, height 5\' 3"  (1.6 m), weight 59.875 kg (132 lb), SpO2 99.00%. Weight change:   Intake/Output Summary (Last 24 hours) at 08/14/11 1324 Last data filed at 08/14/11 1100  Gross per 24 hour  Intake    130 ml  Output    600 ml  Net   -470 ml   Physical Exam: General appearance: lethargic - no acute resp distress Lungs: clear to auscultation bilaterally w/o wheeze Heart: regular rate and rhythm, S1, S2 normal, no murmur Abdomen: soft, tender, no bowel sounds, non- distended Extremities: extremities normal, atraumatic, no cyanosis or edema Neuro: no focal deficits appreciable   Lab Results:  Basename 08/13/11 0410 08/13/11 0401 08/12/11 2119  NA 149* -- 149*  K 3.7 -- 3.6  CL 108 -- 107  CO2 29 -- 26  GLUCOSE 163* -- 167*  BUN 68* -- 65*  CREATININE 2.38* -- 2.23*  CALCIUM 8.8 -- 9.0  MG -- 2.4 --  PHOS 3.7 -- --   Basename 08/13/11 0410 08/12/11 2119  AST -- 17  ALT -- 12  ALKPHOS -- 37*  BILITOT -- 0.9  PROT -- 5.2*  ALBUMIN 2.7* 2.9*   Basename 08/12/11 1646 08/12/11 0656  WBC 9.8 7.8  NEUTROABS -- 6.7  HGB 9.7* 9.3*  HCT 31.1* 29.3*  MCV 99.0 97.7  PLT 132* 110*   Micro Results: Recent Results (from the past 240 hour(s))  URINE CULTURE     Status: Normal   Collection Time   08/04/11  2:53 PM      Component Value Range Status Comment   Specimen Description URINE, CLEAN CATCH   Final    Special Requests NONE   Final    Culture  Setup Time [id number]   Final    Colony Count >=100,000 COLONIES/ML   Final    Culture ESCHERICHIA COLI   Final    Report Status 08/06/2011 FINAL   Final    Organism ID, Bacteria ESCHERICHIA COLI   Final   SURGICAL PCR SCREEN     Status: Normal   Collection Time   08/06/11  1:28 PM      Component Value Range Status Comment   MRSA, PCR NEGATIVE  NEGATIVE Final    Staphylococcus aureus NEGATIVE  NEGATIVE Final     Studies/Results: Ct Head Wo Contrast  08/12/2011  *RADIOLOGY REPORT*  Clinical Data: Found unresponsive, questionable seizure.  CT HEAD WITHOUT CONTRAST  Technique:  Contiguous axial images were obtained from the base of the skull through the vertex without contrast.  Comparison: 07/20/2008  Findings: Some  images are degraded by motion.  Allowing for this, mild prominence of the sulci, cisterns, and ventricles, in keeping with volume loss. There are subcortical and periventricular white matter hypodensities, a nonspecific finding most often seen  with chronic microangiopathic changes.  There is no evidence for acute hemorrhage, overt hydrocephalus, mass lesion, or abnormal extra-axial fluid collection.  No definite CT evidence for acute cortical based (large artery) infarction.  Nonspecific left sphenoid chamber mucosal thickening.  Otherwise, the visualized paranasal sinuses and mastoid air cells are predominately clear. NG tube present.  No displaced calvarial fracture.  Atherosclerotic vascular calcifications.  IMPRESSION: White matter changes and volume loss as above.  No definite acute intracranial abnormality.  Original Report Authenticated By: Waneta Martins, M.D.   Assessment/Plan:  Generalized recurrent seizure As per Neuro - changing from keppra to dilantin  Posterior Reversible Encephalopathy Syndrome/HTN cont  clonidine patch, metoprolol and hydralazine IV - she was previously on Atenolol and low dose PO clonidine  ITP she was receiving 10 mg of prednisone daily as an outpt - will rapidly taper stress dose hydrocortisone, as BP control is of upmost importance at present  ARF on CKD IV As per Nephrology - pt is followed by Dr. Eliott Nine in oupt setting  Pan sensitive E coli UTI/Pyelo As per urine culture dated 08/04/2011 - it is unclear to me if she was tx for this or not - will initiate rocephin tx, and recheck UA today   Ventral Hernia repair with post op ileus Per primary team (Gen Surgery)  DM2 CBG variable at this time - follow trend with tapering of steroids  Dispo We will cont to follow along with you as a medical consultatnt  08/14/2011, 1:24 PM  LOS: 8 days   Lonia Blood, MD [facility] Office  [phone] Pager [phone]  On-Call/Text Page:      [email or web address]      password [facility]

## 2011-08-15 DIAGNOSIS — N3000 Acute cystitis without hematuria: Secondary | ICD-10-CM

## 2011-08-15 DIAGNOSIS — G40109 Localization-related (focal) (partial) symptomatic epilepsy and epileptic syndromes with simple partial seizures, not intractable, without status epilepticus: Secondary | ICD-10-CM

## 2011-08-15 DIAGNOSIS — R197 Diarrhea, unspecified: Secondary | ICD-10-CM

## 2011-08-15 DIAGNOSIS — I10 Essential (primary) hypertension: Secondary | ICD-10-CM

## 2011-08-15 DIAGNOSIS — R569 Unspecified convulsions: Secondary | ICD-10-CM

## 2011-08-15 DIAGNOSIS — K7682 Hepatic encephalopathy: Secondary | ICD-10-CM

## 2011-08-15 DIAGNOSIS — K7290 Hepatic failure, unspecified without coma: Secondary | ICD-10-CM

## 2011-08-15 LAB — RENAL FUNCTION PANEL
CO2: 33 mEq/L — ABNORMAL HIGH (ref 19–32)
Chloride: 111 mEq/L (ref 96–112)
Creatinine, Ser: 2.49 mg/dL — ABNORMAL HIGH (ref 0.50–1.10)
GFR calc Af Amer: 20 mL/min — ABNORMAL LOW (ref >90)
GFR calc non Af Amer: 18 mL/min — ABNORMAL LOW (ref >90)
Sodium: 151 mEq/L — ABNORMAL HIGH (ref 135–145)

## 2011-08-15 LAB — GLUCOSE, CAPILLARY
Glucose-Capillary: 146 mg/dL — ABNORMAL HIGH (ref 70–99)
Glucose-Capillary: 153 mg/dL — ABNORMAL HIGH (ref 70–99)
Glucose-Capillary: 162 mg/dL — ABNORMAL HIGH (ref 70–99)

## 2011-08-15 LAB — URINE CULTURE

## 2011-08-15 MED ORDER — HYDROCORTISONE SOD SUCCINATE 100 MG IJ SOLR
25.0000 mg | Freq: Every day | INTRAMUSCULAR | Status: DC
  Administered 2011-08-16: 25 mg via INTRAVENOUS
  Filled 2011-08-15 (×2): qty 0.5

## 2011-08-15 MED ORDER — SODIUM CHLORIDE 0.9 % IJ SOLN
INTRAMUSCULAR | Status: AC
  Administered 2011-08-15: 10 mL
  Filled 2011-08-15: qty 10

## 2011-08-15 MED ORDER — METOPROLOL TARTRATE 1 MG/ML IV SOLN
10.0000 mg | Freq: Four times a day (QID) | INTRAVENOUS | Status: DC
  Administered 2011-08-15 – 2011-08-16 (×3): 10 mg via INTRAVENOUS
  Filled 2011-08-15 (×4): qty 10

## 2011-08-15 MED ORDER — ATENOLOL 25 MG PO TABS
25.0000 mg | ORAL_TABLET | Freq: Two times a day (BID) | ORAL | Status: DC
  Administered 2011-08-15 – 2011-08-18 (×6): 25 mg via ORAL
  Filled 2011-08-15 (×9): qty 1

## 2011-08-15 MED ORDER — POTASSIUM CHLORIDE 10 MEQ/100ML IV SOLN
10.0000 meq | INTRAVENOUS | Status: AC
  Administered 2011-08-15 (×3): 10 meq via INTRAVENOUS
  Filled 2011-08-15 (×3): qty 100

## 2011-08-15 NOTE — Consult Note (Signed)
[facility] TEAM 1 - Stepdown/ICU TEAM  Interim history: 78y/o female with DM 2, CKD, AOCD, HTN and ITP on low dose prednisone. She has had 1 seizure in  the past with a negative EEG.  She was admitted by surgery on 6/11 and underwent a ventral hernia repair.  Medicine was consulted due to a witnessed tonic clonic seizure on 6/17. We subsequentially consulted Neuro who has started Keppra for her.   Subjective: [name] very alert today, sitting up in bed, conversant and able to recognize her daughter. Per daughter who fed the patient lunch today, she ate quite well and is drinking fluids well. Pt does not have any complaints currently but was wanting to walk to the bathroom due to urge to urinate earlier. She has a foley cath.   Objective: Blood pressure 162/65, pulse 79, temperature 98.8 F (37.1 C), temperature source Oral, resp. rate 17, height 5\' 3"  (1.6 m), weight 59.875 kg (132 lb), SpO2 99.00%. Weight change:   Intake/Output Summary (Last 24 hours) at 08/15/11 1548 Last data filed at 08/15/11 1217  Gross per 24 hour  Intake    750 ml  Output   1875 ml  Net  -1125 ml   Physical Exam: General appearance: alert, no distress, mittens on both hands Lungs: clear to auscultation bilaterally w/o wheeze Heart: regular rate and rhythm, S1, S2 normal, no murmur Abdomen: soft, tender, no bowel sounds, non- distended, incisions is clean. Staples intact Extremities: extremities normal, atraumatic, no cyanosis or edema Neuro: no focal deficits appreciable   Lab Results:  Basename 08/15/11 0335 08/13/11 0410 08/13/11 0401  NA 151* 149* --  K 3.3* 3.7 --  CL 111 108 --  CO2 33* 29 --  GLUCOSE 159* 163* --  BUN 69* 68* --  CREATININE 2.49* 2.38* --  CALCIUM 8.7 8.8 --  MG -- -- 2.4  PHOS 3.2 3.7 --    Basename 08/15/11 0335 08/13/11 0410 08/12/11 2119  AST -- -- 17  ALT -- -- 12  ALKPHOS -- -- 37*  BILITOT -- -- 0.9  PROT -- -- 5.2*  ALBUMIN 2.8* 2.7* --     Basename 08/12/11 1646  WBC 9.8  NEUTROABS --  HGB 9.7*  HCT 31.1*  MCV 99.0  PLT 132*   Micro Results: Recent Results (from the past 240 hour(s))  SURGICAL PCR SCREEN     Status: Normal   Collection Time   08/06/11  1:28 PM      Component Value Range Status Comment   MRSA, PCR NEGATIVE  NEGATIVE Final    Staphylococcus aureus NEGATIVE  NEGATIVE Final      Assessment/Plan:  Generalized recurrent seizure As per Neuro - changed from keppra to dilantin  Posterior Reversible Encephalopathy Syndrome/HTN cont clonidine patch, metoprolol and hydralazine IV - she was previously on Atenolol and low dose PO clonidine Will resume Atenolol  as she appears to be tolerating PO- will place holding parameters on IV Metoprolol  ITP she was receiving 10 mg of prednisone daily as an outpt - will give one last dose of hydrocortisone tomorrow AM before discontinuing changing back to PO Prednisone.   ARF on CKD IV As per Nephrology - now on Lasix - pt is followed by Dr. Eliott Nine in oupt setting  Hypokalemia Replaced IV this AM- may need continuous replacement due to being on lasix.   Pan sensitive E coli UTI/Pyelo As per urine culture dated 08/04/2011 - it is unclear to me if she  was tx for this or not - will initiate rocephin tx, and recheck UA today   Ventral Hernia repair with post op ileus Per primary team (Gen Surgery)  DM2 CBG stable - follow trend with tapering of steroids  Dispo We will cont to follow along with you   08/15/2011, 3:48 PM  LOS: 9 days   Calvert Cantor, MD [phone]

## 2011-08-15 NOTE — Care Management Note (Signed)
    Page 1 of 2   08/26/2011     1:15:26 PM   CARE MANAGEMENT NOTE 08/26/2011  Patient:  [name], [name]   Account Number:  [id number]  Date Initiated:  08/06/2011  Documentation initiated by:  Ronny Flurry  Subjective/Objective Assessment:   s/p hernia repair     Action/Plan:   PT/OT evals needed   Anticipated DC Date:  08/23/2011   Anticipated DC Plan:  HOME W HOME HEALTH SERVICES      DC Planning Services  CM consult      Choice offered to / List presented to:             Status of service:  In process, will continue to follow Medicare Important Message given?   (If response is "NO", the following Medicare IM given date fields will be blank) Date Medicare IM given:   Date Additional Medicare IM given:    Discharge Disposition:    Per UR Regulation:  Reviewed for med. necessity/level of care/duration of stay  If discussed at Long Length of Stay Meetings, dates discussed:   08/14/2011    Comments:  PCP- Doristine Counter  08-26-11 - 11am Avie Arenas, RNBSN [phone] Now extubated but still very sleepy - slow to come around - continues on HD.  Ltach order recieved, talked with daughter Jasmine December about progression Ltach plan.  would like to have her upstairs at [facility] rather than [facility]. Awaiting bed offers.  08-22-11 Vale Haven, RNBSN [phone] Hope for extubation today.  08-19-11 Patient  was admitted by surgery on 6/11 and underwent a ventral hernia repair.  Medicine was consulted due to a witnessed tonic clonic seizure on 6/17 patient was transferred to SDU ( 3300 ) . Neuro  started Keppra for her. On 08-16-11 patient was transferred to 5100.  ON 08-18-11 at 0705  K 6.6 . Patient received 30g kayexalate and repeat potassium 6.9. Will give another dose of Kayexalate 30g x 1.  Initial hemodialysis on 08-18-11 . on 08-19-11 patient became agonal and obtunded. Initial concern was respiratory but minutes later patient noted to have tonic clonic seizures of LUE that soon involved all  body; 2mg  ativan given. Intubated transferred to ICU.  Ronny Flurry RN BSN [phone]    08/15/11- 1114- Donn Pierini RN, BSN [phone] UR completed, will advance diet slowly- pt will need PT/OT evals to assist with d/c needs and planning - MD please order PT/OT evals.

## 2011-08-15 NOTE — Progress Notes (Signed)
[facility] Progress Note  This is a resident note. Please see below for attending addendum for additional details.  Subjective:   Currently, the patient is slightly more interactive than yesterday, daughter indicates that she was more verbal yesterday, and at least part of her responses were appropriate..    Interval Events: MRI indicated PRES, therefore, focus has been shifted upon improving blood pressure control - therefore hydralazine was added.   Objective:    Vital Signs:   Temp:  [98 F (36.7 C)-99.3 F (37.4 C)] 99.3 F (37.4 C) (06/20 0700) Pulse Rate:  [74-86] 79  (06/20 0000) Resp:  [15-18] 17  (06/20 0000) BP: (117-178)/(38-65) 162/65 mmHg (06/20 0637) SpO2:  [97 %-99 %] 99 % (06/20 0000) Last BM Date: 08/13/11  Intake/Output:  06/19 0701 - 06/20 0700 In: 20 [I.V.:20] Out: 2100 [Urine:2100]   Physical Exam: General Exam:   Lungs:  Normal respiratory effort. Clear to auscultation BL without crackles or wheezes.  Heart: RRR. S1 and S2 normal without gallop, rubs. (+) systolic murmur.  Abdomen:  BS +. Soft, Nondistended, non-tender.  No masses or organomegaly.  Extremities: No pretibial edema.     Neurologic Exam:   Mental Status: In no acute distress; lethargic, not oriented to person, place, or time. Difficulty with following commands, although improved from yesterday (now able to open eyes to command, still unable to engage in muscle strength testing with command)  Cranial Nerves:   II: Does not consistently blink to confrontation bilaterally - although improved from yesterday.   III/IV/VI: Extraocular movements intact grossly.  Pupils reactive bilaterally.  V/VII: Smile symmetric.  VIII: Grossly intact.  IX/X: Did not assess gag reflex.  XI: Unable to assess bilateral shoulder shrug as patient unable to follow commands.  XII: Unable to assess tongue extension as patient unable to follow commands.  Motor:  BL Upper extremities 3+/5, LLE 2/5,  RLE 3/5.  Sensory:  Reactive to noxious stimuli.  DTRs: 1+ BL upper extremities, 1+ BL knees, absent at the BL ankles  Plantars:  Downgoing right, not moving on left.    Labs: Basic Metabolic Panel:  Lab 08/15/11 1610 08/13/11 0410 08/13/11 0401 08/12/11 2119 08/12/11 1646 08/12/11 0656 08/11/11 1057 08/10/11 0600 08/09/11 0610  NA 151* 149* -- 149* 149* 148* -- -- --  K 3.3* 3.7 -- 3.6 3.7 3.1* -- -- --  CL 111 108 -- 107 105 105 -- -- --  CO2 33* 29 -- 26 23 27  -- -- --  GLUCOSE 159* 163* -- 167* 179* 133* -- -- --  BUN 69* 68* -- 65* 63* 58* -- -- --  CREATININE 2.49* 2.38* -- 2.23* 2.40* 2.20* -- -- --  CALCIUM 8.7 8.8 -- 9.0 -- -- -- -- --  MG -- -- 2.4 -- -- -- -- -- --  PHOS 3.2 3.7 -- -- -- -- 2.9 3.3 3.6    Liver Function Tests:  Lab 08/15/11 0335 08/13/11 0410 08/12/11 2119 08/11/11 1057 08/10/11 0600  AST -- -- 17 -- --  ALT -- -- 12 -- --  ALKPHOS -- -- 37* -- --  BILITOT -- -- 0.9 -- --  PROT -- -- 5.2* -- --  ALBUMIN 2.8* 2.7* 2.9* 2.6* 2.7*     Lab 08/12/11 1800  AMMONIA 13    CBC:  Lab 08/12/11 1646 08/12/11 0656 08/11/11 1057 08/10/11 0600 08/09/11 0610  WBC 9.8 7.8 8.4 9.3 11.1*  NEUTROABS -- 6.7 7.0 -- --  HGB 9.7* 9.3* 9.2* 9.5*  10.3*  HCT 31.1* 29.3* 30.1* 31.3* 32.7*  MCV 99.0 97.7 97.7 99.1 97.9  PLT 132* 110* 102* 101* 88*    Cardiac Enzymes:  Lab 08/13/11 1412 08/13/11 0410 08/13/11 0208 08/12/11 2119 08/12/11 1848  CKTOTAL 37 39 43 39 38  CKMB 3.5 3.8 3.8 3.8 3.6  CKMBINDEX -- -- -- -- --  TROPONINI 0.46* 0.64* 0.64* 0.55* 0.67*    CBG:  Lab 08/15/11 0744 08/15/11 0432 08/15/11 0013 08/14/11 2032 08/14/11 1536  GLUCAP 153* 169* 162* 150* 189*    Microbiology: Results for orders placed during the hospital encounter of 08/06/11  SURGICAL PCR SCREEN     Status: Normal   Collection Time   08/06/11  1:28 PM      Component Value Range Status Comment   MRSA, PCR NEGATIVE  NEGATIVE Final    Staphylococcus aureus NEGATIVE   NEGATIVE Final     Imaging:  Ct Head Wo Contrast (08/12/2011) - White matter changes and volume loss as above.  No definite acute intracranial abnormality.  Original Report Authenticated By: Waneta Martins, M.D.   Mr Brain Wo Contrast (08/13/2011) - Findings suggestive of posterior reversible encephalopathy syndrome as detailed above.  This has been made a PRA call report utilizing dashboard call feature.  Original Report Authenticated By: Fuller Canada, M.D.   EEG (08/14/2011) - This routine EEG done with the patient with decreased responsiveness is abnormal. Background activities are too slow suggesting a moderate encephalopathy of nonspecific etiology. In the delta activities mixed with frequent sharp waves over the left central and vertex region are potential source of seizures. While at times, the sharp waves become more frequent, I do not favor that these represent electrographic seizures. If her clinical condition does not improve; however, a repeat EEG may add additional clinical information.    Medications:    Infusions:    . dextrose 5 % and 0.45% NaCl 10 mL/hr (08/14/11 1715)  . DISCONTD: dextrose 5 % and 0.9% NaCl 10 mL/hr (08/13/11 0909)    Scheduled Medications:    . antiseptic oral rinse  15 mL Mouth Rinse q12n4p  . bisacodyl  10 mg Rectal Daily  . cefTRIAXone (ROCEPHIN)  IV  1 g Intravenous Q24H  . chlorhexidine  15 mL Mouth Rinse BID  . cloNIDine  0.3 mg Transdermal Weekly  . darbepoetin (ARANESP) injection - NON-DIALYSIS  60 mcg Subcutaneous Q Sat-1800  . furosemide  40 mg Intravenous Q12H  . hydrALAZINE  10 mg Intravenous Q6H  . hydrocortisone sod succinate (SOLU-CORTEF) injection  25 mg Intravenous Q12H  . insulin aspart  0-9 Units Subcutaneous Q4H  . lip balm  1 application Topical BID  . metoprolol  10 mg Intravenous Q6H  . pantoprazole (PROTONIX) IV  40 mg Intravenous QHS  . phenytoin (DILANTIN) IV  950 mg Intravenous Once  . phenytoin (DILANTIN) IV   100 mg Intravenous Q8H  . potassium chloride  10 mEq Intravenous Q1 Hr x 3  . sodium chloride      . DISCONTD: cloNIDine  0.2 mg Transdermal Weekly  . DISCONTD: hydrocortisone sod succinate (SOLU-CORTEF) injection  50 mg Intravenous Q8H  . DISCONTD: levetiracetam  250 mg Intravenous Q12H  . DISCONTD: phenytoin (DILANTIN) IV  700 mg Intravenous Once    PRN Medications: hydrALAZINE, magic mouthwash, menthol-cetylpyridinium, ondansetron   Assessment/ Plan:    Pt is a 78 y.o. yo female with a PMHx of DMII (last A1c 7.1 in 2011), B12 deficiency, CKD IV who was  admitted on 08/06/2011 for elective ventral hernia repair. Neurology service is consulted for evaluation and treatment of recurrent generalized seizure (with a witnessed tonic clonic seizure on 08/12/2011).  Patient was started on Keppra therapy (06/17).  Assessment: 1) Recurrent Generalized Seizure - likely related to #2. Currently on Keppra therapy without recurrence of seizure activity. 2) Posterior Reversible Encephalopathy Syndrome - noted on MRI brain this AM. PRES is likely the main factor contributing towards the patient's AMS as well as recent seizure activity. It is likely secondary to elevated blood pressures with persistent elevation of SBP to 170s mmHg. 3) CKD IV - LE vascular access in place several months ago at recommendation of primary nephrologist, Dr. Eliott Nine, in anticipation of future need for HD. Renal does not feel acute HD needs at this point.   Plan: 1) Recurrent generalized seizure  - Continue dilantin for now. - Given seizure likely secondary to PRES, will discuss with attending if need to continue antiseizure medication beyond MRI clearing from PRES.  2) Posterior Reversible Encephalopathy Syndrome - likely risk factors of CKD and possible baseline HTN. At this point, main goal is improved blood pressure control with goal of 140/70s mmHg. - Continue hydralazine IV. - Continue clonidine and lasix added per  nephrology team.  3) HTN - blood pressure goal of 140/70 mmHg secondary to #1. Improved yesterday, with most values < 150 mmHg systolic. - As per #2   Length of Stay: 9 days   Patient history and plan of care reviewed with attending, Dr. Thana Farr.   Signed: Johnette Abraham, Erline Levine, Internal Medicine Resident Pager: [phone] (7AM-5PM) 08/15/2011, 8:38 AM    Hospital course and management discussed at length.  Dilantin to be continued.  Dosing being managed by pharmacy at this time.  Will likely not require chronic Dilantin therapy. I agree with the above.  Thana Farr, MD [facility] [phone]  08/15/2011  5:31 PM

## 2011-08-15 NOTE — Progress Notes (Signed)
Subjective:  Mental status seems better today.  BP is down.  EEG and MRI suggestive of encephalopathy ?hypertensive vs metabolic.  Objective Vital signs in last 24 hours: Filed Vitals:   08/15/11 0000 08/15/11 0400 08/15/11 0637 08/15/11 0700  BP: 131/62  162/65   Pulse: 79     Temp: 99.2 F (37.3 C) 98.6 F (37 C)  99.3 F (37.4 C)  TempSrc: Oral Oral  Oral  Resp: 17     Height:      Weight:      SpO2: 99%      Weight change:   Intake/Output Summary (Last 24 hours) at 08/15/11 1000 Last data filed at 08/15/11 0800  Gross per 24 hour  Intake    250 ml  Output   2225 ml  Net  -1975 ml   Labs: Basic Metabolic Panel:  Lab 08/15/11 4098 08/13/11 0410 08/12/11 2119 08/11/11 1057  NA 151* 149* 149* --  K 3.3* 3.7 3.6 --  CL 111 108 107 --  CO2 33* 29 26 --  GLUCOSE 159* 163* 167* --  BUN 69* 68* 65* --  CREATININE 2.49* 2.38* 2.23* --  CALCIUM 8.7 8.8 9.0 --  ALB -- -- -- --  PHOS 3.2 3.7 -- 2.9   Liver Function Tests:  Lab 08/15/11 0335 08/13/11 0410 08/12/11 2119  AST -- -- 17  ALT -- -- 12  ALKPHOS -- -- 37*  BILITOT -- -- 0.9  PROT -- -- 5.2*  ALBUMIN 2.8* 2.7* 2.9*   No results found for this basename: LIPASE:3,AMYLASE:3 in the last 168 hours  Lab 08/12/11 1800  AMMONIA 13   CBC:  Lab 08/12/11 1646 08/12/11 0656 08/11/11 1057 08/10/11 0600 08/09/11 0610  WBC 9.8 7.8 8.4 -- --  NEUTROABS -- 6.7 7.0 -- --  HGB 9.7* 9.3* 9.2* -- --  HCT 31.1* 29.3* 30.1* -- --  MCV 99.0 97.7 97.7 99.1 97.9  PLT 132* 110* 102* -- --   Cardiac Enzymes:  Lab 08/13/11 1412 08/13/11 0410 08/13/11 0208 08/12/11 2119 08/12/11 1848  CKTOTAL 37 39 43 39 38  CKMB 3.5 3.8 3.8 3.8 3.6  CKMBINDEX -- -- -- -- --  TROPONINI 0.46* 0.64* 0.64* 0.55* 0.67*   CBG:  Lab 08/15/11 0744 08/15/11 0432 08/15/11 0013 08/14/11 2032 08/14/11 1536  GLUCAP 153* 169* 162* 150* 189*    Iron Studies:  No results found for this basename: IRON,TIBC,TRANSFERRIN,FERRITIN in the last 72  hours Studies/Results: Mr Brain Wo Contrast  08/13/2011  *RADIOLOGY REPORT*  Clinical Data: Post hernia repair.  Renal failure.  Confusion after surgery.  Questionable seizure.  MRI HEAD WITHOUT CONTRAST  Technique:  Multiplanar, multiecho pulse sequences of the brain and surrounding structures were obtained according to standard protocol without intravenous contrast.  Comparison: 08/12/2011 CT.  07/15/2008 MR.  Findings: Symmetric altered signal intensity involving the cerebellum bilaterally, mid to posterior temporal lobes bilaterally, occipital lobes bilaterally more notable on the right and the medial aspect of the parietal lobes.  The distribution and history are suggestive of posterior reversible encephalopathy syndrome. Other causes such as that related to infection, metabolic abnormality and or inflammatory process felt to be less likely considerations.  Global atrophy without hydrocephalus.  Moderate small vessel disease type changes.  No acute thrombotic infarct.  No intracranial hemorrhage.  No intracranial mass lesion detected on this unenhanced exam.  Abnormal appearance of the left vertebral artery which may be occluded.  Paranasal sinus mucosal thickening.  Partially empty sella incidentally  noted.  IMPRESSION: Findings suggestive of posterior reversible encephalopathy syndrome as detailed above.  This has been made Adkins PRA call report utilizing dashboard call feature.  Original Report Authenticated By: Fuller Canada, M.D.   Medications: Infusions:    . dextrose 5 % and 0.45% NaCl 10 mL/hr (08/14/11 1715)  . DISCONTD: dextrose 5 % and 0.9% NaCl 10 mL/hr (08/13/11 0909)    Scheduled Medications:    . antiseptic oral rinse  15 mL Mouth Rinse q12n4p  . bisacodyl  10 mg Rectal Daily  . cefTRIAXone (ROCEPHIN)  IV  1 g Intravenous Q24H  . chlorhexidine  15 mL Mouth Rinse BID  . cloNIDine  0.3 mg Transdermal Weekly  . darbepoetin (ARANESP) injection - NON-DIALYSIS  60 mcg Subcutaneous Q  Sat-1800  . furosemide  40 mg Intravenous Q12H  . hydrALAZINE  10 mg Intravenous Q6H  . hydrocortisone sod succinate (SOLU-CORTEF) injection  25 mg Intravenous Q12H  . insulin aspart  0-9 Units Subcutaneous Q4H  . lip balm  1 application Topical BID  . metoprolol  10 mg Intravenous Q6H  . pantoprazole (PROTONIX) IV  40 mg Intravenous QHS  . phenytoin (DILANTIN) IV  950 mg Intravenous Once  . phenytoin (DILANTIN) IV  100 mg Intravenous Q8H  . potassium chloride  10 mEq Intravenous Q1 Hr x 3  . sodium chloride      . DISCONTD: hydrocortisone sod succinate (SOLU-CORTEF) injection  50 mg Intravenous Q8H  . DISCONTD: phenytoin (DILANTIN) IV  700 mg Intravenous Once    have reviewed scheduled and prn medications.  Physical Exam: General: somnolent, but arousable HEART- RRR ungs: mostly clr Abdomen: distended, currently painful,  Extremities: dependant edema.  Has left upper arm AVF, good thrill and bruit  I Assessment/ Plan: Pt is Adkins 78 y.o. yo female who was admitted on 08/06/2011 with an incarcerated ventral hernia, s/p surgical repair.  complicated by acute on chronic renal failure  Assessment/Plan: 1. ARF on Chronic kidney disease stage IV: due to ATN vs hypoperfusion. Creat stable , adequate UOP.   I think patient would be Adkins poor candidate for chronic dialysis therapy due to dementia/anxiety. Fortunately no indications to date. Hopefully renal function will continue to hang in there.  Again, does patient need solucortef? Not sure what purpose is, am afraid it will worsen uremia and htn 2. AMS- better.  now s/p ?sz/encephalopathy.  Neuro on board 3. Hypertension: Much better control with current measures.  No change today.  4. Anemia- stable, on aranesp 5. Metabolic bone disease: phos WNL 6. S/p ventral hernia repair with ileus- still with NG output, per surgery.   7. Hypokalemia- continue to replete.   8. Hypernatremia- needs  More free water, will try to get, push fluid intake as  able 9. Dispo- unfortunately has had Adkins very difficult time with this surgery and complications consider discussions about code status   [name]   08/15/2011,10:00 AM  LOS: 9 days

## 2011-08-15 NOTE — Progress Notes (Signed)
As above. Abd soft - will advance diet slowly. Staples can come out tomorrow with steri-strips. Will give some K+

## 2011-08-15 NOTE — Progress Notes (Signed)
Patient ID: Dante Gang, female   DOB: 07/19/1932, 78 y.o.   MRN: [id number] Patient ID: CICLALY MULCAHEY, female   DOB: 1932/11/07, 78 y.o.   MRN: [id number] 9 Days Post-Op  Subjective: Much more alert this am,current mental state appears to be much clearer today. Daughter at bedside. No report of NV, Had BM last PM, "eating" per daughter.  Objective: Vital signs in last 24 hours: Temp:  [98 F (36.7 C)-99.2 F (37.3 C)] 98.6 F (37 C) (06/20 0400) Pulse Rate:  [74-86] 79  (06/20 0000) Resp:  [15-18] 17  (06/20 0000) BP: (117-178)/(38-65) 162/65 mmHg (06/20 0637) SpO2:  [97 %-99 %] 99 % (06/20 0000) Last BM Date: 08/13/11  Intake/Output from previous day: 06/19 0701 - 06/20 0700 In: 20 [I.V.:20] Out: 2100 [Urine:2100] Intake/Output this shift:    General appearance:much more alert this am, does not appear to be in any acute distress.  Abdomen: remains soft, positive BS, non distended.  Chest:CTA Cardiac: RRR, no murmurs, rubs, gallops.    Lab Results:   [facility] 08/12/11 1646  WBC 9.8  HGB 9.7*  HCT 31.1*  PLT 132*   BMET  Basename 08/15/11 0335 08/13/11 0410  NA 151* 149*  K 3.3* 3.7  CL 111 108  CO2 33* 29  GLUCOSE 159* 163*  BUN 69* 68*  CREATININE 2.49* 2.38*  CALCIUM 8.7 8.8   PT/INR No results found for this basename: LABPROT:2,INR:2 in the last 72 hours ABG  Basename 08/12/11 1658  PHART 7.413*  HCO3 24.3*    Studies/Results: Mr Brain Wo Contrast  08/13/2011  *RADIOLOGY REPORT*  Clinical Data: Post hernia repair.  Renal failure.  Confusion after surgery.  Questionable seizure.  MRI HEAD WITHOUT CONTRAST  Technique:  Multiplanar, multiecho pulse sequences of the brain and surrounding structures were obtained according to standard protocol without intravenous contrast.  Comparison: 08/12/2011 CT.  07/15/2008 MR.  Findings: Symmetric altered signal intensity involving the cerebellum bilaterally, mid to posterior temporal lobes bilaterally, occipital lobes  bilaterally more notable on the right and the medial aspect of the parietal lobes.  The distribution and history are suggestive of posterior reversible encephalopathy syndrome. Other causes such as that related to infection, metabolic abnormality and or inflammatory process felt to be less likely considerations.  Global atrophy without hydrocephalus.  Moderate small vessel disease type changes.  No acute thrombotic infarct.  No intracranial hemorrhage.  No intracranial mass lesion detected on this unenhanced exam.  Abnormal appearance of the left vertebral artery which may be occluded.  Paranasal sinus mucosal thickening.  Partially empty sella incidentally noted.  IMPRESSION: Findings suggestive of posterior reversible encephalopathy syndrome as detailed above.  This has been made a PRA call report utilizing dashboard call feature.  Original Report Authenticated By: Fuller Canada, M.D.    Anti-infectives: Anti-infectives     Start     Dose/Rate Route Frequency Ordered Stop   08/14/11 1800   cefTRIAXone (ROCEPHIN) 1 g in dextrose 5 % 50 mL IVPB        1 g 100 mL/hr over 30 Minutes Intravenous Every 24 hours 08/14/11 1706     08/06/11 1400   ceFAZolin (ANCEF) IVPB 1 g/50 mL premix  Status:  Discontinued        1 g 100 mL/hr over 30 Minutes Intravenous 60 min pre-op 08/06/11 1313 08/06/11 1800          Assessment/Plan: s/p Procedure(s) (LRB): HERNIA REPAIR VENTRAL ADULT (N/A) 1. Much more alert this am,  NG remains out. No NV +BS,BM. 2. BP appears at goal this am, will continue with BP management. 3. Slightly Hypernatremic,Hypokalemic; but no S/S so will continue to watch for now. Will discuss with Dr. Jamey Ripa as well. ? Need to replete K+ at this point. Blenda Mounts 08/15/2011

## 2011-08-16 DIAGNOSIS — I674 Hypertensive encephalopathy: Secondary | ICD-10-CM

## 2011-08-16 DIAGNOSIS — R197 Diarrhea, unspecified: Secondary | ICD-10-CM

## 2011-08-16 DIAGNOSIS — G40109 Localization-related (focal) (partial) symptomatic epilepsy and epileptic syndromes with simple partial seizures, not intractable, without status epilepticus: Secondary | ICD-10-CM

## 2011-08-16 DIAGNOSIS — R569 Unspecified convulsions: Secondary | ICD-10-CM

## 2011-08-16 DIAGNOSIS — N3000 Acute cystitis without hematuria: Secondary | ICD-10-CM

## 2011-08-16 DIAGNOSIS — I10 Essential (primary) hypertension: Secondary | ICD-10-CM

## 2011-08-16 LAB — GLUCOSE, CAPILLARY
Glucose-Capillary: 113 mg/dL — ABNORMAL HIGH (ref 70–99)
Glucose-Capillary: 234 mg/dL — ABNORMAL HIGH (ref 70–99)
Glucose-Capillary: 212 mg/dL — ABNORMAL HIGH (ref 70–99)

## 2011-08-16 LAB — BASIC METABOLIC PANEL
CO2: 36 mEq/L — ABNORMAL HIGH (ref 19–32)
Chloride: 110 mEq/L (ref 96–112)
Glucose, Bld: 95 mg/dL (ref 70–99)
Potassium: 3.2 mEq/L — ABNORMAL LOW (ref 3.5–5.1)
Sodium: 154 mEq/L — ABNORMAL HIGH (ref 135–145)

## 2011-08-16 MED ORDER — POTASSIUM CHLORIDE CRYS ER 20 MEQ PO TBCR
40.0000 meq | EXTENDED_RELEASE_TABLET | Freq: Two times a day (BID) | ORAL | Status: DC
  Administered 2011-08-16 – 2011-08-17 (×4): 40 meq via ORAL
  Filled 2011-08-16 (×6): qty 2

## 2011-08-16 MED ORDER — POTASSIUM CHLORIDE 10 MEQ/100ML IV SOLN
10.0000 meq | INTRAVENOUS | Status: AC
  Administered 2011-08-16 (×4): 10 meq via INTRAVENOUS
  Filled 2011-08-16 (×4): qty 100

## 2011-08-16 MED ORDER — DEXTROSE 5 % IV SOLN
INTRAVENOUS | Status: DC
  Administered 2011-08-16 – 2011-08-17 (×2): via INTRAVENOUS

## 2011-08-16 NOTE — Progress Notes (Signed)
Report given to [facility] on 5100, to transfer to 5121-02 via bed, daughter accompanying patient, Berle Mull RN

## 2011-08-16 NOTE — Progress Notes (Signed)
[facility] Progress Note  This is a resident note. Please see below for attending addendum for additional details.  Subjective:    Currently, the patient is feeling better than prior, states she is feeling more clear than before. She denies chest pain, shortness of breath, difficulty breathing, nausea, vomiting, abdominal pain. No new focal neurologic deficits that she can tell.  Interval Events: Interval clearing of mentation.   Objective:    Vital Signs:   Temp:  [98.3 F (36.8 C)-99 F (37.2 C)] 99 F (37.2 C) (06/21 0741) Pulse Rate:  [81-95] 87  (06/21 0400) Resp:  [18-23] 23  (06/21 0000) BP: (127-166)/(57-84) 166/68 mmHg (06/21 0443) SpO2:  [97 %-99 %] 97 % (06/21 0400) Last BM Date: 08/13/11  Intake/Output:  06/20 0701 - 06/21 0700 In: 1360 [P.O.:1240; I.V.:120] Out: 2150 [Urine:2150]   Physical Exam: General Exam:   Lungs:  Normal respiratory effort. Clear to auscultation BL without crackles or wheezes.  Heart: RRR. S1 and S2 normal without gallop, rubs. (+) systolic murmur.  Abdomen:  BS +. Soft, Nondistended, non-tender.  No masses or organomegaly.  Extremities: No pretibial edema.     Neurologic Exam:   Mental Status: In no acute distress; lethargic although improved from yesterday, oriented x 1 to person. Not oriented to place (thought she was at school) or time (thought it was 55). Improving ability to follow commands although unable to follow complex instrutions (is able to engage in muscle strength testing today, though cannot follow instructions for testing of extraocular muscles).  Cranial Nerves:   II: Blinks to confrontation.  III/IV/VI: Extraocular movements intact grossly.  Pupils reactive bilaterally.  V/VII: Smile symmetric.  VIII: Grossly intact.  IX/X: Did not assess gag reflex.  XI: Shoulder shrug intact bilaterally.  XII: Tongue extension midline  Motor:  BL Upper extremities 4+/5, BL LE 5/5  Sensory:  Reactive to noxious  stimuli and light touch sensations.  DTRs: 1+ BL upper extremities, 1+ BL knees, absent at the BL ankles  Plantars:  Downgoing bilaterally.    Labs: Basic Metabolic Panel:  Lab 08/16/11 0102 08/15/11 0335 08/13/11 0410 08/13/11 0401 08/12/11 2119 08/12/11 1646 08/11/11 1057 08/10/11 0600  NA 154* 151* 149* -- 149* 149* -- --  K 3.2* 3.3* 3.7 -- 3.6 3.7 -- --  CL 110 111 108 -- 107 105 -- --  CO2 36* 33* 29 -- 26 23 -- --  GLUCOSE 95 159* 163* -- 167* 179* -- --  BUN 62* 69* 68* -- 65* 63* -- --  CREATININE 2.56* 2.49* 2.38* -- 2.23* 2.40* -- --  CALCIUM 8.9 8.7 8.8 -- -- -- -- --  MG -- -- -- 2.4 -- -- -- --  PHOS -- 3.2 3.7 -- -- -- 2.9 3.3    Liver Function Tests:  Lab 08/15/11 0335 08/13/11 0410 08/12/11 2119 08/11/11 1057 08/10/11 0600  AST -- -- 17 -- --  ALT -- -- 12 -- --  ALKPHOS -- -- 37* -- --  BILITOT -- -- 0.9 -- --  PROT -- -- 5.2* -- --  ALBUMIN 2.8* 2.7* 2.9* 2.6* 2.7*     Lab 08/12/11 1800  AMMONIA 13    CBC:  Lab 08/12/11 1646 08/12/11 0656 08/11/11 1057 08/10/11 0600  WBC 9.8 7.8 8.4 9.3  NEUTROABS -- 6.7 7.0 --  HGB 9.7* 9.3* 9.2* 9.5*  HCT 31.1* 29.3* 30.1* 31.3*  MCV 99.0 97.7 97.7 99.1  PLT 132* 110* 102* 101*    Cardiac Enzymes:  Lab 08/13/11 1412 08/13/11 0410 08/13/11 0208 08/12/11 2119 08/12/11 1848  CKTOTAL 37 39 43 39 38  CKMB 3.5 3.8 3.8 3.8 3.6  CKMBINDEX -- -- -- -- --  TROPONINI 0.46* 0.64* 0.64* 0.55* 0.67*    CBG:  Lab 08/16/11 0744 08/16/11 0436 08/16/11 0005 08/15/11 2017 08/15/11 1552  GLUCAP 113* 110* 154* 269* 258*    Microbiology: Results for orders placed during the hospital encounter of 08/06/11  SURGICAL PCR SCREEN     Status: Normal   Collection Time   08/06/11  1:28 PM      Component Value Range Status Comment   MRSA, PCR NEGATIVE  NEGATIVE Final    Staphylococcus aureus NEGATIVE  NEGATIVE Final   URINE CULTURE     Status: Normal   Collection Time   08/14/11  5:39 PM      Component Value Range Status  Comment   Specimen Description URINE, CATHETERIZED   Final    Special Requests NONE   Final    Culture  Setup Time [id number]   Final    Colony Count NO GROWTH   Final    Culture NO GROWTH   Final    Report Status 08/15/2011 FINAL   Final     Imaging:  Ct Head Wo Contrast (08/12/2011) - White matter changes and volume loss as above.  No definite acute intracranial abnormality.  Original Report Authenticated By: Waneta Martins, M.D.   Mr Brain Wo Contrast (08/13/2011) - Findings suggestive of posterior reversible encephalopathy syndrome as detailed above.  This has been made a PRA call report utilizing dashboard call feature.  Original Report Authenticated By: Fuller Canada, M.D.   EEG (08/14/2011) - This routine EEG done with the patient with decreased responsiveness is abnormal. Background activities are too slow suggesting a moderate encephalopathy of nonspecific etiology. In the delta activities mixed with frequent sharp waves over the left central and vertex region are potential source of seizures. While at times, the sharp waves become more frequent, I do not favor that these represent electrographic seizures. If her clinical condition does not improve; however, a repeat EEG may add additional clinical information.    Medications:    Infusions:    . dextrose    . DISCONTD: dextrose 5 % and 0.45% NaCl 10 mL/hr at 08/15/11 1900    Scheduled Medications:    . antiseptic oral rinse  15 mL Mouth Rinse q12n4p  . atenolol  25 mg Oral BID  . bisacodyl  10 mg Rectal Daily  . cefTRIAXone (ROCEPHIN)  IV  1 g Intravenous Q24H  . chlorhexidine  15 mL Mouth Rinse BID  . cloNIDine  0.3 mg Transdermal Weekly  . darbepoetin (ARANESP) injection - NON-DIALYSIS  60 mcg Subcutaneous Q Sat-1800  . furosemide  40 mg Intravenous Q12H  . hydrALAZINE  10 mg Intravenous Q6H  . hydrocortisone sod succinate (SOLU-CORTEF) injection  25 mg Intravenous Daily  . insulin aspart  0-9 Units  Subcutaneous Q4H  . lip balm  1 application Topical BID  . metoprolol  10 mg Intravenous Q6H  . pantoprazole (PROTONIX) IV  40 mg Intravenous QHS  . phenytoin (DILANTIN) IV  100 mg Intravenous Q8H  . potassium chloride  10 mEq Intravenous Q1 Hr x 3  . potassium chloride  10 mEq Intravenous Q1 Hr x 4  . potassium chloride  40 mEq Oral BID  . sodium chloride      . DISCONTD: hydrocortisone sod succinate (SOLU-CORTEF) injection  25 mg Intravenous Q12H  . DISCONTD: metoprolol  10 mg Intravenous Q6H    PRN Medications: hydrALAZINE, magic mouthwash, menthol-cetylpyridinium, ondansetron   Assessment/ Plan:    Pt is a 78 y.o. yo female with a PMHx of DMII (last A1c 7.1 in 2011), B12 deficiency, CKD IV who was admitted on 08/06/2011 for elective ventral hernia repair. Neurology service is consulted for evaluation and treatment of recurrent generalized seizure (with a witnessed tonic clonic seizure on 08/12/2011).  Patient was started on Keppra therapy (06/17).  Assessment: 1) Recurrent Generalized Seizure - likely related to #2. Currently on Dilantin therapy without recurrence of seizure activity. 2) Posterior Reversible Encephalopathy Syndrome - noted on MRI brain this AM. PRES is likely the main factor contributing towards the patient's AMS as well as recent seizure activity. It is likely secondary to elevated blood pressures with persistent elevation of SBP to 170s mmHg. 3) CKD IV - LE vascular access in place several months ago at recommendation of primary nephrologist, Dr. Eliott Nine, in anticipation of future need for HD. Renal does not feel acute HD needs at this point.   Plan: 1) Recurrent generalized seizure  - Continue dilantin for now. - Given seizure likely secondary to PRES, will likely not require long term dilantin. - Appreciate pharmacy assistance and input in the management of our patient's dilantin.  2) Posterior Reversible Encephalopathy Syndrome - likely risk factors of CKD and  possible baseline HTN. At this point, main goal is improved blood pressure control with goal of 140/70s mmHg. - Continue hydralazine, consider to escalate dosage to allow for more consistent control of blood pressures of the goal 140/70 mmHg if persistently elevated. - Continue clonidine and lasix added per nephrology team. - Consider recheck MRI within next few days to assess for interval change.  3) HTN - blood pressure goal of 140/70 mmHg secondary to #1. Few elevated blood pressures overnight. - As per #2  LOS: 10 days  Patient history and plan of care reviewed with attending, Dr. Thana Farr.   Signed: Johnette Abraham, D.ODonnajean Lopes, Internal Medicine Resident Pager: [phone] (7AM-5PM) 08/16/2011, 9:36 AM     Patient seen and examined. Hospital course and management discussed at length.  I agree with the above.  Thana Farr, MD [facility] [phone]  08/16/2011  1:02 PM

## 2011-08-16 NOTE — Evaluation (Addendum)
Physical Therapy Evaluation Patient Details Name: DAMARIA VACHON MRN: [id number] DOB: 04-30-1932 Today's Date: 08/16/2011 Time: 8469-6295 PT Time Calculation (min): 28 min  PT Assessment / Plan / Recommendation Clinical Impression  Pt adm for ventral hernia repair.  Pt then suffered seizure.  Currently pt with very poor mobility and needs skilled PT to maximize I and safety to decr burden of care. Pt's family plans to provide assist at home.  Recommend CIR consulty.    PT Assessment  Patient needs continued PT services    Follow Up Recommendations  Inpatient Rehab    Barriers to Discharge        lEquipment Recommendations  Defer to next venue    Recommendations for Other Services Rehab consult   Frequency Min 3X/week    Precautions / Restrictions Precautions Precautions: Fall   Pertinent Vitals/Pain N/A      Mobility  Bed Mobility Bed Mobility: Supine to Sit;Scooting to [facility];Sitting - Scoot to Edge of Bed;Sit to Supine Supine to Sit: 1: +1 Total assist;HOB elevated Sitting - Scoot to Edge of Bed: 1: +1 Total assist Sit to Supine: 1: +1 Total assist Scooting to HOB: 1: +2 Total assist Scooting to [facility]: Patient Percentage: 30% Details for Bed Mobility Assistance: Pt needed assist with legs and trunk.    Exercises     PT Diagnosis: Difficulty walking;Generalized weakness;Altered mental status  PT Problem List: Decreased strength;Decreased activity tolerance;Decreased balance;Decreased mobility;Decreased knowledge of use of DME;Decreased cognition;Decreased safety awareness;Decreased knowledge of precautions PT Treatment Interventions: DME instruction;Functional mobility training;Gait training;Therapeutic activities;Therapeutic exercise;Balance training;Patient/family education   PT Goals Acute Rehab PT Goals PT Goal Formulation: With patient Time For Goal Achievement: 08/30/11 Potential to Achieve Goals: Fair Pt will go Supine/Side to Sit: with mod assist PT Goal:  Supine/Side to Sit - Progress: Goal set today Pt will Sit at Edge of Bed: with supervision;3-5 min PT Goal: Sit at Edge Of Bed - Progress: Goal set today Pt will go Sit to Supine/Side: with mod assist PT Goal: Sit to Supine/Side - Progress: Goal set today Pt will go Sit to Stand: with mod assist PT Goal: Sit to Stand - Progress: Goal set today Pt will go Stand to Sit: with mod assist PT Goal: Stand to Sit - Progress: Goal set today Pt will Transfer Bed to Chair/Chair to Bed: with mod assist PT Transfer Goal: Bed to Chair/Chair to Bed - Progress: Goal set today  Visit Information  Last PT Received On: 08/16/11 Assistance Needed: +2    Subjective Data  Subjective: "I want to just sit here," pt stated about not getting up to EOB. Patient Stated Goal: Daughter wants pt to return home.   Prior Functioning  Home Living Lives With: Son Available Help at Discharge: Family;Available 24 hours/day Type of Home: House Home Access: Level entry Home Layout: One level Bathroom Shower/Tub: Engineer, manufacturing systems: Standard Home Adaptive Equipment: Tub transfer bench;Bedside commode/3-in-1;Walker - rolling;Straight cane Prior Function Level of Independence: Independent with assistive device(s) (cane for amb) Able to Take Stairs?: Yes Driving: No Vocation: Retired Musician: [employer] Dominant Hand: Right    Cognition  Overall Cognitive Status: Impaired Area of Impairment: Memory;Following commands;Awareness of deficits Arousal/Alertness: Lethargic Orientation Level: Disoriented to;Place;Time;Situation Behavior During Session: Lethargic Memory Deficits: Decr short term memory Following Commands: Follows one step commands consistently    Extremity/Trunk Assessment Right Lower Extremity Assessment RLE ROM/Strength/Tone: Deficits RLE ROM/Strength/Tone Deficits: pt with active movement against gravity. Unable to formally assess due to cognition Left Lower Extremity  Assessment LLE ROM/Strength/Tone: Deficits LLE ROM/Strength/Tone Deficits: active movement against gravity.  Unable to formally assess due to cognition   Balance Static Sitting Balance Static Sitting - Balance Support: Bilateral upper extremity supported;Feet supported Static Sitting - Level of Assistance: 3: Mod assist;4: Min assist Static Sitting - Comment/# of Minutes: Sat EOB for 7-8 minutes.  Mod A with occasional brief times with min A.  Pt with rt lean.  End of Session PT - End of Session Activity Tolerance: Patient limited by fatigue Patient left: in bed;with call bell/phone within reach;with bed alarm set;with family/visitor present Nurse Communication: Mobility status   Aodhan Scheidt 08/16/2011, 3:47 PM  [facility] PT [phone]

## 2011-08-16 NOTE — Consult Note (Signed)
[facility] [place] TEAM 1 - Stepdown/ICU TEAM  Interim history: 78y/o female with DM 2, CKD, AOCD, HTN and ITP on low dose prednisone. She has had 1 seizure in  the past with a negative EEG.  She was admitted by surgery on 6/11 and underwent a ventral hernia repair.  Medicine was consulted due to a witnessed tonic clonic seizure on 6/17. We subsequentially consulted Neuro who has started Keppra for her.   Subjective: [name] less alert at this time. No complaints.   Objective: Blood pressure 133/50, pulse 83, temperature 98.4 F (36.9 C), temperature source Oral, resp. rate 16, height 5\' 3"  (1.6 m), weight 59.875 kg (132 lb), SpO2 100.00%. Weight change:   Intake/Output Summary (Last 24 hours) at 08/16/11 2051 Last data filed at 08/16/11 1511  Gross per 24 hour  Intake 1347.75 ml  Output   1275 ml  Net  72.75 ml   Physical Exam: General appearance: sleepy but awakens easily, no distress, mittens on both hands HEENT- tongue has gray-brown coating Lungs: clear to auscultation bilaterally w/o wheeze Heart: regular rate and rhythm, S1, S2 normal, no murmur Abdomen: soft, tender, no bowel sounds, non- distended, incisions is clean. Staples removed Extremities: extremities normal, atraumatic, no cyanosis or edema Neuro: no focal deficits appreciable   Lab Results:  Basename 08/16/11 0400 08/15/11 0335  NA 154* 151*  K 3.2* 3.3*  CL 110 111  CO2 36* 33*  GLUCOSE 95 159*  BUN 62* 69*  CREATININE 2.56* 2.49*  CALCIUM 8.9 8.7  MG -- --  PHOS -- 3.2    Basename 08/15/11 0335  AST --  ALT --  ALKPHOS --  BILITOT --  PROT --  ALBUMIN 2.8*   No results found for this basename: WBC:2,NEUTROABS:2,HGB:2,HCT:2,MCV:2,PLT:2 in the last 72 hours Micro Results: Recent Results (from the past 240 hour(s))  URINE CULTURE     Status: Normal   Collection Time   08/14/11  5:39 PM      Component Value Range Status Comment   Specimen Description URINE, CATHETERIZED   Final    Special Requests NONE   Final    Culture  Setup Time [id number]   Final    Colony Count NO GROWTH   Final    Culture NO GROWTH   Final    Report Status 08/15/2011 FINAL   Final      Assessment/Plan:  Generalized recurrent seizure As per Neuro - changed from keppra to dilantin  Posterior Reversible Encephalopathy Syndrome/HTN cont clonidine patch, metoprolol and hydralazine IV - she was previously on Atenolol and low dose PO clonidine Have resumed Atenolol and discontinued IV Metoprolol  ITP she was receiving 10 mg of prednisone daily as an outpt - transitioned back to PO Prednisone today.   ARF on CKD IV As per Nephrology - now on Lasix - pt is followed by Dr. Eliott Nine in oupt setting  Hypernatremia Started D5 -  on Lasix as well- follow Na tomorrow  Hypokalemia Will need continuous replacement due to being on lasix.   Pan sensitive E coli UTI/Pyelo As per urine culture dated 08/04/2011 - and repeat UA is negative- agree with stopping Rocephin today   Ventral Hernia repair with post op ileus Per primary team (Gen Surgery)  DM2 CBG stable - follow trend with tapering of steroids  Dispo We will cont to follow along with you   08/16/2011, 8:51 PM  LOS: 10 days   Calvert Cantor, MD [phone]

## 2011-08-16 NOTE — Progress Notes (Signed)
Subjective:  Is better still today. BP is stable, making urine, kidney function stable.  GI/ileus is better as well.  Objective Vital signs in last 24 hours: Filed Vitals:   08/16/11 0000 08/16/11 0400 08/16/11 0443 08/16/11 0741  BP: 127/84 166/68 166/68   Pulse: 95 87  82  Temp: 98.8 F (37.1 C) 98.3 F (36.8 C)  99 F (37.2 C)  TempSrc: Oral Oral  Oral  Resp: 23   18  Height:      Weight:      SpO2: 97% 97%  94%   Weight change:   Intake/Output Summary (Last 24 hours) at 08/16/11 1022 Last data filed at 08/16/11 0741  Gross per 24 hour  Intake   1120 ml  Output   2200 ml  Net  -1080 ml   Labs: Basic Metabolic Panel:  Lab 08/16/11 4098 08/15/11 0335 08/13/11 0410 08/11/11 1057  NA 154* 151* 149* --  K 3.2* 3.3* 3.7 --  CL 110 111 108 --  CO2 36* 33* 29 --  GLUCOSE 95 159* 163* --  BUN 62* 69* 68* --  CREATININE 2.56* 2.49* 2.38* --  CALCIUM 8.9 8.7 8.8 --  ALB -- -- -- --  PHOS -- 3.2 3.7 2.9   Liver Function Tests:  Lab 08/15/11 0335 08/13/11 0410 08/12/11 2119  AST -- -- 17  ALT -- -- 12  ALKPHOS -- -- 37*  BILITOT -- -- 0.9  PROT -- -- 5.2*  ALBUMIN 2.8* 2.7* 2.9*   No results found for this basename: LIPASE:3,AMYLASE:3 in the last 168 hours  Lab 08/12/11 1800  AMMONIA 13   CBC:  Lab 08/12/11 1646 08/12/11 0656 08/11/11 1057 08/10/11 0600  WBC 9.8 7.8 8.4 --  NEUTROABS -- 6.7 7.0 --  HGB 9.7* 9.3* 9.2* --  HCT 31.1* 29.3* 30.1* --  MCV 99.0 97.7 97.7 99.1  PLT 132* 110* 102* --   Cardiac Enzymes:  Lab 08/13/11 1412 08/13/11 0410 08/13/11 0208 08/12/11 2119 08/12/11 1848  CKTOTAL 37 39 43 39 38  CKMB 3.5 3.8 3.8 3.8 3.6  CKMBINDEX -- -- -- -- --  TROPONINI 0.46* 0.64* 0.64* 0.55* 0.67*   CBG:  Lab 08/16/11 0744 08/16/11 0436 08/16/11 0005 08/15/11 2017 08/15/11 1552  GLUCAP 113* 110* 154* 269* 258*    Iron Studies:  No results found for this basename: IRON,TIBC,TRANSFERRIN,FERRITIN in the last 72 hours Studies/Results: No results  found. Medications: Infusions:    . dextrose 75 mL/hr at 08/16/11 0841  . DISCONTD: dextrose 5 % and 0.45% NaCl 10 mL/hr at 08/15/11 1900    Scheduled Medications:    . antiseptic oral rinse  15 mL Mouth Rinse q12n4p  . atenolol  25 mg Oral BID  . bisacodyl  10 mg Rectal Daily  . cefTRIAXone (ROCEPHIN)  IV  1 g Intravenous Q24H  . chlorhexidine  15 mL Mouth Rinse BID  . cloNIDine  0.3 mg Transdermal Weekly  . darbepoetin (ARANESP) injection - NON-DIALYSIS  60 mcg Subcutaneous Q Sat-1800  . furosemide  40 mg Intravenous Q12H  . hydrALAZINE  10 mg Intravenous Q6H  . hydrocortisone sod succinate (SOLU-CORTEF) injection  25 mg Intravenous Daily  . insulin aspart  0-9 Units Subcutaneous Q4H  . lip balm  1 application Topical BID  . metoprolol  10 mg Intravenous Q6H  . pantoprazole (PROTONIX) IV  40 mg Intravenous QHS  . phenytoin (DILANTIN) IV  100 mg Intravenous Q8H  . potassium chloride  10 mEq Intravenous  Q1 Hr x 3  . potassium chloride  10 mEq Intravenous Q1 Hr x 4  . potassium chloride  40 mEq Oral BID  . sodium chloride      . DISCONTD: hydrocortisone sod succinate (SOLU-CORTEF) injection  25 mg Intravenous Q12H  . DISCONTD: metoprolol  10 mg Intravenous Q6H    have reviewed scheduled and prn medications.  Physical Exam: General: more alert HEART- RRR ungs: mostly clr Abdomen: softer  Extremities: dependant edema.  Has left upper arm AVF, good thrill and bruit  I Assessment/ Plan: Pt is a 78 y.o. yo female who was admitted on 08/06/2011 with an incarcerated ventral hernia, s/p surgical repair.  complicated by acute on chronic renal failure  Assessment/Plan: 1. ARF on Chronic kidney disease stage IV: due to ATN vs hypoperfusion. Creat stable , adequate UOP.  Fortunately no indications for dialysis to date. Hopefully renal function will continue to hang in there.  2. AMS- better.  now s/p ?sz/encephalopathy.  Neuro on board 3. Hypertension: Much better control with  current measures.  No change today.  4. Anemia- stable to improved, on aranesp 5. Metabolic bone disease: phos WNL 6. S/p ventral hernia repair with ileus- better, pt reports BM and taking POS 7. Hypokalemia- continue to replete, increase to 80 meq q day per primary.   8. Hypernatremia- needs  more free water, will try to get, push fluid intake as able.  Is not able to correct by POs alone, will give D5W- actually already ordered  Dispo- has made remarkable progress the last 48 hours- actually everything (K, Na)  is being covered by surgery/medicine. Renal will sign off, call with questions.   Ashle Stief A   08/16/2011,10:22 AM  LOS: 10 days

## 2011-08-16 NOTE — Progress Notes (Signed)
Agree with note of RC,MD. Will advance diet and move to floor

## 2011-08-16 NOTE — Progress Notes (Signed)
Removed 9 staples per order from midline incision, steris applied with benzoin per protocol, area where approximately staple 5 removed halfway down midline incision with constant ooze of blood, incision is well aproximated without redness, patient does have large area of eccymosis to abdomen , manual pressure applied x 3 minutes then small pressure dressing of folded 4x4 applied to middle area of incision with control of bleeding, Marisue Ivan PA for surgery notified and will check patient in approximately 1 hour when transferred to [id number],  Berle Mull RN

## 2011-08-16 NOTE — Progress Notes (Signed)
Patient ID: [name], female   DOB: 12/21/1932, 78 y.o.   MRN: [id number] 10 Days Post-Op  Subjective: Pleasant, awake.  Daughter reports good PO intake of full liquid diet, +BM.  Denies pain and nausea.   Objective: Vital signs in last 24 hours: Temp:  [98.3 F (36.8 C)-99 F (37.2 C)] 99 F (37.2 C) (06/21 0741) Pulse Rate:  [81-95] 87  (06/21 0400) Resp:  [18-23] 23  (06/21 0000) BP: (127-166)/(57-84) 166/68 mmHg (06/21 0443) SpO2:  [97 %-99 %] 97 % (06/21 0400) Last BM Date: 08/13/11  Intake/Output from previous day: 06/20 0701 - 06/21 0700 In: 1360 [P.O.:1240; I.V.:120] Out: 2150 [Urine:2150] Intake/Output this shift: Total I/O In: -  Out: 300 [Urine:300]  General appearance:Awake, alert, oriented to person and place.  No distress.  Abdomen: remains soft, positive BS, non distended.  Wound: staples in place, wound healing well with no drainage or bleeding.  Chest:CTAB Cardiac: RRR, no murmurs, rubs, gallops.    Lab Results:  BMET  Basename 08/16/11 0400 08/15/11 0335  NA 154* 151*  K 3.2* 3.3*  CL 110 111  CO2 36* 33*  GLUCOSE 95 159*  BUN 62* 69*  CREATININE 2.56* 2.49*  CALCIUM 8.9 8.7    Anti-infectives: Anti-infectives     Start     Dose/Rate Route Frequency Ordered Stop   08/14/11 1800   cefTRIAXone (ROCEPHIN) 1 g in dextrose 5 % 50 mL IVPB        1 g 100 mL/hr over 30 Minutes Intravenous Every 24 hours 08/14/11 1706     08/06/11 1400   ceFAZolin (ANCEF) IVPB 1 g/50 mL premix  Status:  Discontinued        1 g 100 mL/hr over 30 Minutes Intravenous 60 min pre-op 08/06/11 1313 08/06/11 1800          Assessment/Plan: s/p Procedure(s) (LRB): HERNIA REPAIR VENTRAL ADULT (N/A) 1. NG remains out. No NV +BS,BM. Will advance diet to low residue diet.  2. BP appears at goal this am, will continue with BP management. 3. Slightly Hypernatremic,Hypokalemic- K+ replaced IV. Encourage free water intake.  4. Wound- d/c staples today and place steri  strips.  5. ID- Urine culture negative, s/p treatment for prior UTI, d/c Ceftriaxone.  6. Dispo- transfer to floor. Will consult PT/OT as patient is deconditioned.  Jleigh Striplin 08/16/2011

## 2011-08-16 NOTE — Progress Notes (Signed)
Inpatient Diabetes Program Recommendations  AACE/ADA: New Consensus Statement on Inpatient Glycemic Control (2009)  Target Ranges:  Prepandial:   less than 140 mg/dL      Peak postprandial:   less than 180 mg/dL (1-2 hours)      Critically ill patients:  140 - 180 mg/dL   Reason for Visit: Results for [name], [name] (MRN [id number]) as of 08/16/2011 14:03  Ref. Range 08/16/2011 07:44 08/16/2011 11:57  Glucose-Capillary Latest Range: 70-99 mg/dL 347 (H) 425 (H)    Inpatient Diabetes Program Recommendations Insulin - Meal Coverage: Consider adding Novolog 3 units tid with meals (hold if patient eats less than 50%). HgbA1C: Please check A1C to determine prehospitalization glycemic control.

## 2011-08-17 DIAGNOSIS — R569 Unspecified convulsions: Secondary | ICD-10-CM

## 2011-08-17 DIAGNOSIS — G40109 Localization-related (focal) (partial) symptomatic epilepsy and epileptic syndromes with simple partial seizures, not intractable, without status epilepticus: Secondary | ICD-10-CM

## 2011-08-17 DIAGNOSIS — R197 Diarrhea, unspecified: Secondary | ICD-10-CM

## 2011-08-17 DIAGNOSIS — I674 Hypertensive encephalopathy: Secondary | ICD-10-CM

## 2011-08-17 DIAGNOSIS — N3000 Acute cystitis without hematuria: Secondary | ICD-10-CM

## 2011-08-17 DIAGNOSIS — I10 Essential (primary) hypertension: Secondary | ICD-10-CM

## 2011-08-17 LAB — CBC
HCT: 34.2 % — ABNORMAL LOW (ref 36.0–46.0)
Hemoglobin: 10.4 g/dL — ABNORMAL LOW (ref 12.0–15.0)
MCH: 30.9 pg (ref 26.0–34.0)
MCV: 101.5 fL — ABNORMAL HIGH (ref 78.0–100.0)
Platelets: 117 10*3/uL — ABNORMAL LOW (ref 150–400)
RBC: 3.37 MIL/uL — ABNORMAL LOW (ref 3.87–5.11)

## 2011-08-17 LAB — GLUCOSE, CAPILLARY
Glucose-Capillary: 117 mg/dL — ABNORMAL HIGH (ref 70–99)
Glucose-Capillary: 132 mg/dL — ABNORMAL HIGH (ref 70–99)
Glucose-Capillary: 102 mg/dL — ABNORMAL HIGH (ref 70–99)

## 2011-08-17 LAB — BASIC METABOLIC PANEL
BUN: 57 mg/dL — ABNORMAL HIGH (ref 6–23)
CO2: 35 mEq/L — ABNORMAL HIGH (ref 19–32)
Chloride: 103 mEq/L (ref 96–112)
Creatinine, Ser: 2.51 mg/dL — ABNORMAL HIGH (ref 0.50–1.10)
Potassium: 4.9 mEq/L (ref 3.5–5.1)

## 2011-08-17 LAB — PLATELET FUNCTION ASSAY: Collagen / Epinephrine: 141 seconds (ref 0–184)

## 2011-08-17 MED ORDER — PREDNISONE 20 MG PO TABS
20.0000 mg | ORAL_TABLET | Freq: Every day | ORAL | Status: DC
  Administered 2011-08-17 – 2011-08-18 (×2): 20 mg via ORAL
  Filled 2011-08-17 (×4): qty 1

## 2011-08-17 MED ORDER — PANTOPRAZOLE SODIUM 40 MG PO TBEC
40.0000 mg | DELAYED_RELEASE_TABLET | Freq: Every day | ORAL | Status: DC
  Administered 2011-08-17 – 2011-08-18 (×2): 40 mg via ORAL
  Filled 2011-08-17 (×2): qty 1

## 2011-08-17 MED ORDER — PHENYTOIN SODIUM EXTENDED 100 MG PO CAPS
100.0000 mg | ORAL_CAPSULE | Freq: Three times a day (TID) | ORAL | Status: DC
Start: 2011-08-17 — End: 2011-08-17
  Filled 2011-08-17 (×2): qty 1

## 2011-08-17 MED ORDER — PHENYTOIN SODIUM EXTENDED 100 MG PO CAPS: 300.0000 mg | ORAL_CAPSULE | Freq: Three times a day (TID) | ORAL | Status: DC

## 2011-08-17 MED ORDER — PHENYTOIN SODIUM EXTENDED 100 MG PO CAPS
100.0000 mg | ORAL_CAPSULE | Freq: Three times a day (TID) | ORAL | Status: DC
  Administered 2011-08-17 – 2011-08-18 (×5): 100 mg via ORAL
  Filled 2011-08-17 (×6): qty 1

## 2011-08-17 MED ORDER — FUROSEMIDE 40 MG PO TABS
40.0000 mg | ORAL_TABLET | Freq: Two times a day (BID) | ORAL | Status: DC
  Filled 2011-08-17 (×2): qty 1

## 2011-08-17 MED ORDER — HYDRALAZINE HCL 10 MG PO TABS
10.0000 mg | ORAL_TABLET | Freq: Four times a day (QID) | ORAL | Status: DC
  Administered 2011-08-17 – 2011-08-18 (×4): 10 mg via ORAL
  Filled 2011-08-17 (×8): qty 1

## 2011-08-17 NOTE — Evaluation (Signed)
Occupational Therapy Evaluation Patient Details Name: [name] MRN: [id number] DOB: 02/06/1933 Today's Date: 08/17/2011 Time:  -    OT eval cancelled to day due to sterilt procedure in progress                                                                 Galen Manila 08/17/2011, 1:53 PM

## 2011-08-17 NOTE — Progress Notes (Signed)
Peripherally Inserted Central Catheter/Midline Placement  The IV Nurse has discussed with the patient and/or persons authorized to consent for the patient, the purpose of this procedure and the potential benefits and risks involved with this procedure.  The benefits include less needle sticks, lab draws from the catheter and patient may be discharged home with the catheter.  Risks include, but not limited to, infection, bleeding, blood clot (thrombus formation), and puncture of an artery; nerve damage and irregular heat beat.  Alternatives to this procedure were also discussed.  PICC/Midline Placement Documentation        Lisabeth Devoid 08/17/2011, 2:36 PM Consent obtained from daughter

## 2011-08-17 NOTE — Progress Notes (Signed)
Attempted to insert RT arm PICC without success.  Limited to right arm due to graft in left arm.  Attempted basilic and cephalic veins.  Unable to thread guidewire.  Veins very small even with ultrasound.   PICC would have occupied 50% of vein.   Attempted to place a PIV in right anterior forearm with U/S without success.  Catheter would not thread.  Primary RN made aware to notify MD.

## 2011-08-17 NOTE — Progress Notes (Signed)
Subjective: Patient alert this morning.  Per report of daughter she is returning to baseline.  Although confused at times the daughter reports that this is usual for her when she is in the hospital.  She does have clear periods when she seems quite oriented.  Objective: Current vital signs: BP 130/59  Pulse 85  Temp 98 F (36.7 C) (Oral)  Resp 16  Ht 5\' 3"  (1.6 m)  Wt 59.875 kg (132 lb)  BMI 23.38 kg/m2  SpO2 92% Vital signs in last 24 hours: Temp:  [98 F (36.7 C)-98.6 F (37 C)] 98 F (36.7 C) (06/22 0612) Pulse Rate:  [80-85] 85  (06/22 0612) Resp:  [16-20] 16  (06/22 0612) BP: (128-158)/(50-84) 130/59 mmHg (06/22 0612) SpO2:  [92 %-100 %] 92 % (06/22 0612)  Intake/Output from previous day: 06/21 0701 - 06/22 0700 In: 2348.8 [P.O.:360; I.V.:1583.8; IV Piggyback:405] Out: 1400 [Urine:1400] Intake/Output this shift:   Nutritional status: Fiber Restricted  Neurologic Exam: Mental Status: Alert.  Knows she is in the hospital.  Reports that it is 78.  Speech fluent without evidence of aphasia.  Able to follow simple commands without difficulty. Cranial Nerves: II: visual fields grossly normal, pupils equal, round, reactive to light and accommodation.  Tracks visitors around the room III,IV, VI: ptosis not present, extra-ocular motions intact bilaterally V,VII: smile symmetric, facial light touch sensation normal bilaterally VIII: hearing normal bilaterally IX,X: gag reflex present XI: trapezius strength/neck flexion strength normal bilaterally XII: tongue strength normal  Motor: Moves all extremities against gravity symmetrically Tone and bulk:normal tone throughout; no atrophy noted Sensory: Pinprick and light touch intact throughout, bilaterally Deep Tendon Reflexes: 1+ and symmetric with absent AJ's bilaterally  Plantars: Right: downgoing   Left: downgoing   Lab Results: Results for orders placed during the hospital encounter of 08/06/11 (from the past 48  hour(s))  GLUCOSE, CAPILLARY     Status: Abnormal   Collection Time   08/15/11 12:13 PM      Component Value Range Comment   Glucose-Capillary 146 (*) 70 - 99 mg/dL    Comment 1 Documented in Chart      Comment 2 Notify RN     GLUCOSE, CAPILLARY     Status: Abnormal   Collection Time   08/15/11  3:52 PM      Component Value Range Comment   Glucose-Capillary 258 (*) 70 - 99 mg/dL    Comment 1 Documented in Chart      Comment 2 Notify RN     GLUCOSE, CAPILLARY     Status: Abnormal   Collection Time   08/15/11  8:17 PM      Component Value Range Comment   Glucose-Capillary 269 (*) 70 - 99 mg/dL    Comment 1 Documented in Chart      Comment 2 Notify RN     GLUCOSE, CAPILLARY     Status: Abnormal   Collection Time   08/16/11 12:05 AM      Component Value Range Comment   Glucose-Capillary 154 (*) 70 - 99 mg/dL    Comment 1 Documented in Chart      Comment 2 Notify RN     BASIC METABOLIC PANEL     Status: Abnormal   Collection Time   08/16/11  4:00 AM      Component Value Range Comment   Sodium 154 (*) 135 - 145 mEq/L    Potassium 3.2 (*) 3.5 - 5.1 mEq/L    Chloride 110  96 -  112 mEq/L    CO2 36 (*) 19 - 32 mEq/L    Glucose, Bld 95  70 - 99 mg/dL    BUN 62 (*) 6 - 23 mg/dL    Creatinine, Ser 1.61 (*) 0.50 - 1.10 mg/dL    Calcium 8.9  8.4 - 09.6 mg/dL    GFR calc non Af Amer 17 (*) >90 mL/min    GFR calc Af Amer 20 (*) >90 mL/min   GLUCOSE, CAPILLARY     Status: Abnormal   Collection Time   08/16/11  4:36 AM      Component Value Range Comment   Glucose-Capillary 110 (*) 70 - 99 mg/dL    Comment 1 Documented in Chart      Comment 2 Notify RN     GLUCOSE, CAPILLARY     Status: Abnormal   Collection Time   08/16/11  7:44 AM      Component Value Range Comment   Glucose-Capillary 113 (*) 70 - 99 mg/dL    Comment 1 Documented in Chart      Comment 2 Notify RN     GLUCOSE, CAPILLARY     Status: Abnormal   Collection Time   08/16/11 11:57 AM      Component Value Range Comment    Glucose-Capillary 234 (*) 70 - 99 mg/dL    Comment 1 Documented in Chart      Comment 2 Notify RN     GLUCOSE, CAPILLARY     Status: Abnormal   Collection Time   08/16/11  3:47 PM      Component Value Range Comment   Glucose-Capillary 181 (*) 70 - 99 mg/dL    Comment 1 Notify RN      Comment 2 Documented in Chart     GLUCOSE, CAPILLARY     Status: Abnormal   Collection Time   08/16/11  8:25 PM      Component Value Range Comment   Glucose-Capillary 212 (*) 70 - 99 mg/dL   GLUCOSE, CAPILLARY     Status: Abnormal   Collection Time   08/16/11 11:59 PM      Component Value Range Comment   Glucose-Capillary 132 (*) 70 - 99 mg/dL   GLUCOSE, CAPILLARY     Status: Abnormal   Collection Time   08/17/11  4:08 AM      Component Value Range Comment   Glucose-Capillary 107 (*) 70 - 99 mg/dL   BASIC METABOLIC PANEL     Status: Abnormal   Collection Time   08/17/11  7:05 AM      Component Value Range Comment   Sodium 144  135 - 145 mEq/L    Potassium 4.9  3.5 - 5.1 mEq/L    Chloride 103  96 - 112 mEq/L    CO2 35 (*) 19 - 32 mEq/L    Glucose, Bld 152 (*) 70 - 99 mg/dL    BUN 57 (*) 6 - 23 mg/dL    Creatinine, Ser 0.45 (*) 0.50 - 1.10 mg/dL    Calcium 8.9  8.4 - 40.9 mg/dL    GFR calc non Af Amer 17 (*) >90 mL/min    GFR calc Af Amer 20 (*) >90 mL/min   GLUCOSE, CAPILLARY     Status: Abnormal   Collection Time   08/17/11  7:56 AM      Component Value Range Comment   Glucose-Capillary 117 (*) 70 - 99 mg/dL    Comment 1 Notify RN  Recent Results (from the past 240 hour(s))  URINE CULTURE     Status: Normal   Collection Time   08/14/11  5:39 PM      Component Value Range Status Comment   Specimen Description URINE, CATHETERIZED   Final    Special Requests NONE   Final    Culture  Setup Time [id number]   Final    Colony Count NO GROWTH   Final    Culture NO GROWTH   Final    Report Status 08/15/2011 FINAL   Final     Lipid Panel No results found for this basename:  CHOL,TRIG,HDL,CHOLHDL,VLDL,LDLCALC in the last 72 hours  Studies/Results: No results found.  Medications:  I have reviewed the patient's current medications. Scheduled:   . antiseptic oral rinse  15 mL Mouth Rinse q12n4p  . atenolol  25 mg Oral BID  . bisacodyl  10 mg Rectal Daily  . chlorhexidine  15 mL Mouth Rinse BID  . cloNIDine  0.3 mg Transdermal Weekly  . darbepoetin (ARANESP) injection - NON-DIALYSIS  60 mcg Subcutaneous Q Sat-1800  . furosemide  40 mg Intravenous Q12H  . hydrALAZINE  10 mg Intravenous Q6H  . hydrocortisone sod succinate (SOLU-CORTEF) injection  25 mg Intravenous Daily  . insulin aspart  0-9 Units Subcutaneous Q4H  . lip balm  1 application Topical BID  . pantoprazole (PROTONIX) IV  40 mg Intravenous QHS  . phenytoin (DILANTIN) IV  100 mg Intravenous Q8H  . potassium chloride  10 mEq Intravenous Q1 Hr x 4  . potassium chloride  40 mEq Oral BID  . DISCONTD: cefTRIAXone (ROCEPHIN)  IV  1 g Intravenous Q24H  . DISCONTD: metoprolol  10 mg Intravenous Q6H    Assessment/Plan:  Patient Active Hospital Problem List:  Seizure, convulsive (08/12/2011)   Assessment: No further seizure activity noted.  Mental status improving.   Plan:  1.  Continue Dilantin.  Pharmacy monitoring dosing.   2.  Once able would change Dilantin to po dosing.  PRES   Assessment:  Blood pressures controlled.  Mental status improving.  Patient able to tell me today where she was and at least attempt to answer the remaining questions appropriately.  No longer perseverative.     Plan:  Agree with continued BP control   LOS: 11 days   Thana Farr, MD [facility] [phone] 08/17/2011  9:12 AM

## 2011-08-17 NOTE — Progress Notes (Signed)
11 Days Post-Op  Subjective: Pt awake but sleepy.  Daughter at bedside.  Objective: Vital signs in last 24 hours: Temp:  [98 F (36.7 C)-98.6 F (37 C)] 98 F (36.7 C) (06/22 0612) Pulse Rate:  [80-85] 85  (06/22 0612) Resp:  [16-20] 16  (06/22 0612) BP: (128-158)/(50-84) 130/59 mmHg (06/22 0612) SpO2:  [92 %-100 %] 92 % (06/22 0612) Last BM Date: 08/15/11  Intake/Output from previous day: 06/21 0701 - 06/22 0700 In: 2348.8 [P.O.:360; I.V.:1583.8; IV Piggyback:405] Out: 1400 [Urine:1400] Intake/Output this shift:    Resp: clear to auscultation bilaterally GI: significant flank bruising.  soft non tender.  wound clean dry intact Neurologic: Grossly normal Extremities  Left arm graft intact.  Left forearm with redness probably secondary to friction.  Left arm bruising noted.  IV in right shoulder infiltrated.   Lab Results:  No results found for this basename: WBC:2,HGB:2,HCT:2,PLT:2 in the last 72 hours BMET  Basename 08/17/11 0705 08/16/11 0400  NA 144 154*  K 4.9 3.2*  CL 103 110  CO2 35* 36*  GLUCOSE 152* 95  BUN 57* 62*  CREATININE 2.51* 2.56*  CALCIUM 8.9 8.9   PT/INR No results found for this basename: LABPROT:2,INR:2 in the last 72 hours ABG No results found for this basename: PHART:2,PCO2:2,PO2:2,HCO3:2 in the last 72 hours  Studies/Results: No results found.  Anti-infectives: Anti-infectives     Start     Dose/Rate Route Frequency Ordered Stop   08/14/11 1800   cefTRIAXone (ROCEPHIN) 1 g in dextrose 5 % 50 mL IVPB  Status:  Discontinued        1 g 100 mL/hr over 30 Minutes Intravenous Every 24 hours 08/14/11 1706 08/16/11 1354   08/06/11 1400   ceFAZolin (ANCEF) IVPB 1 g/50 mL premix  Status:  Discontinued        1 g 100 mL/hr over 30 Minutes Intravenous 60 min pre-op 08/06/11 1313 08/06/11 1800          Assessment/Plan: s/p Procedure(s) (LRB): HERNIA REPAIR VENTRAL ADULT (N/Adkins) PICC line Significant bruising noted.  High risk of bleeding.   Hold pharmalogical DVT prophylaxsis. Keep foley for strict I and O PT/OT Very deconditioned. Contact dermatitis right forearm. Check bleeding studies given bruising.  LOS: 11 days    [name]. 08/17/2011

## 2011-08-17 NOTE — Consult Note (Signed)
[facility] [place] TEAM 4  Interim history: 79y/o female with DM 2, CKD, AOCD, HTN and ITP on low dose prednisone. She has had 1 seizure in  the past with a negative EEG.  She was admitted by surgery on 6/11 and underwent a ventral hernia repair.  Medicine was consulted due to a witnessed tonic clonic seizure on 6/17. We subsequentially consulted Neuro who has started Keppra for her.   Subjective: [name] more alert and following commands. Patient with bouts of confusion.  Objective: Blood pressure 145/70, pulse 88, temperature 98 F (36.7 C), temperature source Oral, resp. rate 16, height 5\' 3"  (1.6 m), weight 59.875 kg (132 lb), SpO2 95.00%. Weight change:   Intake/Output Summary (Last 24 hours) at 08/17/11 1531 Last data filed at 08/17/11 1455  Gross per 24 hour  Intake   1581 ml  Output   1100 ml  Net    481 ml   Physical Exam: General appearance: Alert, no distress, mittens on both hands HEENT- tongue has gray-brown coating Lungs: clear to auscultation bilaterally w/o wheeze Heart: regular rate and rhythm, S1, S2 normal, with 2/6 SEM murmur Abdomen: soft, tender, no bowel sounds, non- distended, incisions is clean. Staples removed Extremities: extremities normal, atraumatic, no cyanosis or edema Neuro: no focal deficits appreciable   Lab Results:  Basename 08/17/11 0705 08/16/11 0400 08/15/11 0335  NA 144 154* --  K 4.9 3.2* --  CL 103 110 --  CO2 35* 36* --  GLUCOSE 152* 95 --  BUN 57* 62* --  CREATININE 2.51* 2.56* --  CALCIUM 8.9 8.9 --  MG -- -- --  PHOS -- -- 3.2    Basename 08/15/11 0335  AST --  ALT --  ALKPHOS --  BILITOT --  PROT --  ALBUMIN 2.8*    Basename 08/17/11 1010  WBC 13.0*  NEUTROABS --  HGB 10.4*  HCT 34.2*  MCV 101.5*  PLT 117*   Micro Results: Recent Results (from the past 240 hour(s))  URINE CULTURE     Status: Normal   Collection Time   08/14/11  5:39 PM      Component Value Range Status Comment   Specimen  Description URINE, CATHETERIZED   Final    Special Requests NONE   Final    Culture  Setup Time [id number]   Final    Colony Count NO GROWTH   Final    Culture NO GROWTH   Final    Report Status 08/15/2011 FINAL   Final      Assessment/Plan:  Generalized recurrent seizure As per Neuro - changed from keppra to dilantin  Posterior Reversible Encephalopathy Syndrome/HTN cont clonidine patch, metoprolol and hydralazine IV - she was previously on Atenolol and low dose PO clonidine Atenolol resumed and discontinued IV Metoprolol.  ITP she was receiving 10 mg of prednisone daily as an outpt - transitioned back to PO Prednisone yesterday.   ARF on CKD IV As per Nephrology - now on Lasix, will change to orally. - pt is followed by Dr. Eliott Nine in oupt setting  Hypernatremia Improving. Continue D5 -  on Lasix as well- follow Na.  Hypokalemia Will need continuous replacement due to being on lasix.   Pan sensitive E coli UTI/Pyelo As per urine culture dated 08/04/2011 - and repeat UA is negative- s/p Rocephin.  Ventral Hernia repair with post op ileus Per primary team (Gen Surgery)  DM2 CBG stable - follow trend with tapering of steroids  Dispo We will  cont to follow along with you   08/17/2011, 3:31 PM  LOS: 11 days   Ramiro Harvest, MD [phone]

## 2011-08-17 NOTE — Progress Notes (Signed)
MEDICATION RELATED CONSULT NOTE - FOLLOW UP   Pharmacy Consult for Dilantin Indication: Seizure-like activity  No Known Allergies  Patient Measurements: Height: 5\' 3"  (160 cm) Weight: 132 lb (59.875 kg) IBW/kg (Calculated) : 52.4  Adjusted Body Weight:   Vital Signs: Temp: 98 F (36.7 C) (06/22 0612) Temp src: Oral (06/22 0612) BP: 130/59 mmHg (06/22 0612) Pulse Rate: 85  (06/22 0612) Intake/Output from previous day: 06/21 0701 - 06/22 0700 In: 2348.8 [P.O.:360; I.V.:1583.8; IV Piggyback:405] Out: 1400 [Urine:1400] Intake/Output from this shift:    Labs:  Basename 08/17/11 0705 08/16/11 0400 08/15/11 0335  WBC -- -- --  HGB -- -- --  HCT -- -- --  PLT -- -- --  APTT -- -- --  CREATININE 2.51* 2.56* 2.49*  LABCREA -- -- --  CREATININE 2.51* 2.56* 2.49*  CREAT24HRUR -- -- --  MG -- -- --  PHOS -- -- 3.2  ALBUMIN -- -- 2.8*  PROT -- -- --  ALBUMIN -- -- 2.8*  AST -- -- --  ALT -- -- --  ALKPHOS -- -- --  BILITOT -- -- --  BILIDIR -- -- --  IBILI -- -- --   Estimated Creatinine Clearance: 15.5 ml/min (by C-G formula based on Cr of 2.51).   Microbiology: Recent Results (from the past 720 hour(s))  URINE CULTURE     Status: Normal   Collection Time   08/04/11  2:53 PM      Component Value Range Status Comment   Specimen Description URINE, CLEAN CATCH   Final    Special Requests NONE   Final    Culture  Setup Time [id number]   Final    Colony Count >=100,000 COLONIES/ML   Final    Culture ESCHERICHIA COLI   Final    Report Status 08/06/2011 FINAL   Final    Organism ID, Bacteria ESCHERICHIA COLI   Final   SURGICAL PCR SCREEN     Status: Normal   Collection Time   08/06/11  1:28 PM      Component Value Range Status Comment   MRSA, PCR NEGATIVE  NEGATIVE Final    Staphylococcus aureus NEGATIVE  NEGATIVE Final   URINE CULTURE     Status: Normal   Collection Time   08/14/11  5:39 PM      Component Value Range Status Comment   Specimen Description URINE,  CATHETERIZED   Final    Special Requests NONE   Final    Culture  Setup Time [id number]   Final    Colony Count NO GROWTH   Final    Culture NO GROWTH   Final    Report Status 08/15/2011 FINAL   Final     Assessment: 27 YOF with EEG and MRI suggestive of encephalopathy s/p elevated ventral hernia repair on 08/06/11 followed by sudden onset unresponsiveness and convulsions on 08/12/11. Patient on Keppra PTA for similar episode in 2010. Keppra had been off prior to episode and was resumed 6/17 after episode. Neurology then stopped Keppra and changing to Phenytoin.   Neurology: difficult to aruose x1 wk s/p ?sz vs encephalopathy (sudden onset of unresponsiveness and convulsions); LFTs wnl on 6/17. More alert/clear-> Neurology does not plan to continue Phenytoin long-term.  6/19: PHY 13.6 (corrects up to 20) post-load.    Goal of Therapy:  Phenytoin level 10-20  Plan:  Continue PHY 100mg  IV q8h.  Phenytoin level around Monday.   Merilynn Finland, [employer] 08/17/2011,9:40 AM

## 2011-08-18 ENCOUNTER — Inpatient Hospital Stay (HOSPITAL_COMMUNITY): Payer: Medicare Other

## 2011-08-18 DIAGNOSIS — G40109 Localization-related (focal) (partial) symptomatic epilepsy and epileptic syndromes with simple partial seizures, not intractable, without status epilepticus: Secondary | ICD-10-CM

## 2011-08-18 DIAGNOSIS — R197 Diarrhea, unspecified: Secondary | ICD-10-CM

## 2011-08-18 DIAGNOSIS — E875 Hyperkalemia: Secondary | ICD-10-CM

## 2011-08-18 DIAGNOSIS — I10 Essential (primary) hypertension: Secondary | ICD-10-CM

## 2011-08-18 LAB — GLUCOSE, CAPILLARY
Glucose-Capillary: 110 mg/dL — ABNORMAL HIGH (ref 70–99)
Glucose-Capillary: 144 mg/dL — ABNORMAL HIGH (ref 70–99)
Glucose-Capillary: 116 mg/dL — ABNORMAL HIGH (ref 70–99)

## 2011-08-18 LAB — COMPREHENSIVE METABOLIC PANEL
ALT: 20 U/L (ref 0–35)
Alkaline Phosphatase: 66 U/L (ref 39–117)
BUN: 58 mg/dL — ABNORMAL HIGH (ref 6–23)
CO2: 35 mEq/L — ABNORMAL HIGH (ref 19–32)
Chloride: 101 mEq/L (ref 96–112)
GFR calc Af Amer: 17 mL/min — ABNORMAL LOW (ref >90)
GFR calc non Af Amer: 15 mL/min — ABNORMAL LOW (ref >90)
Glucose, Bld: 135 mg/dL — ABNORMAL HIGH (ref 70–99)
Potassium: 6.6 mEq/L (ref 3.5–5.1)
Total Bilirubin: 0.5 mg/dL (ref 0.3–1.2)

## 2011-08-18 LAB — BASIC METABOLIC PANEL
BUN: 59 mg/dL — ABNORMAL HIGH (ref 6–23)
CO2: 34 mEq/L — ABNORMAL HIGH (ref 19–32)
Calcium: 9.1 mg/dL (ref 8.4–10.5)
GFR calc non Af Amer: 14 mL/min — ABNORMAL LOW (ref >90)
Glucose, Bld: 135 mg/dL — ABNORMAL HIGH (ref 70–99)

## 2011-08-18 MED ORDER — GLUCOSE 40 % PO GEL: 1.0000 | ORAL | Status: DC | PRN

## 2011-08-18 MED ORDER — DEXTROSE 50 % IV SOLN: 50.0000 mL | Freq: Once | INTRAVENOUS | Status: DC | PRN

## 2011-08-18 MED ORDER — PENTAFLUOROPROP-TETRAFLUOROETH EX AERO: 1.0000 "application " | INHALATION_SPRAY | CUTANEOUS | Status: DC | PRN

## 2011-08-18 MED ORDER — DEXTROSE 50 % IV SOLN
INTRAVENOUS | Status: AC
  Administered 2011-08-18: 50 mL
  Filled 2011-08-18: qty 50

## 2011-08-18 MED ORDER — GLUCAGON HCL (RDNA) 1 MG IJ SOLR: 0.5000 mg | Freq: Once | INTRAMUSCULAR | Status: AC | PRN

## 2011-08-18 MED ORDER — INSULIN ASPART 100 UNIT/ML ~~LOC~~ SOLN
10.0000 [IU] | Freq: Once | SUBCUTANEOUS | Status: AC
  Administered 2011-08-18: 10 [IU] via SUBCUTANEOUS

## 2011-08-18 MED ORDER — DEXTROSE 50 % IV SOLN: 25.0000 mL | Freq: Once | INTRAVENOUS | Status: AC | PRN

## 2011-08-18 MED ORDER — HYDRALAZINE HCL 20 MG/ML IJ SOLN
10.0000 mg | Freq: Four times a day (QID) | INTRAMUSCULAR | Status: DC
  Administered 2011-08-18 – 2011-08-19 (×2): 10 mg via INTRAVENOUS
  Filled 2011-08-18 (×6): qty 0.5

## 2011-08-18 MED ORDER — ALTEPLASE 2 MG IJ SOLR
2.0000 mg | Freq: Once | INTRAMUSCULAR | Status: AC | PRN
  Filled 2011-08-18: qty 2

## 2011-08-18 MED ORDER — DEXTROSE 50 % IV SOLN
1.0000 | Freq: Once | INTRAVENOUS | Status: AC
  Administered 2011-08-18: 50 mL via INTRAVENOUS
  Filled 2011-08-18: qty 50

## 2011-08-18 MED ORDER — DEXTROSE-NACL 5-0.9 % IV SOLN: INTRAVENOUS | Status: DC

## 2011-08-18 MED ORDER — GLUCOSE-VITAMIN C 4-6 GM-MG PO CHEW: 4.0000 | CHEWABLE_TABLET | ORAL | Status: DC | PRN

## 2011-08-18 MED ORDER — DEXTROSE 5 % IV SOLN: INTRAVENOUS | Status: DC

## 2011-08-18 MED ORDER — SODIUM CHLORIDE 0.9 % IV SOLN
1.0000 g | Freq: Once | INTRAVENOUS | Status: AC
  Administered 2011-08-18: 1 g via INTRAVENOUS
  Filled 2011-08-18: qty 10

## 2011-08-18 MED ORDER — HEPARIN SODIUM (PORCINE) 1000 UNIT/ML DIALYSIS
1000.0000 [IU] | INTRAMUSCULAR | Status: DC | PRN
  Filled 2011-08-18: qty 1

## 2011-08-18 MED ORDER — DEXTROSE 50 % IV SOLN: 50.0000 mL | Freq: Once | INTRAVENOUS | Status: AC | PRN

## 2011-08-18 MED ORDER — SODIUM POLYSTYRENE SULFONATE 15 GM/60ML PO SUSP
45.0000 g | Freq: Once | ORAL | Status: DC
  Filled 2011-08-18: qty 180

## 2011-08-18 MED ORDER — FUROSEMIDE 10 MG/ML IJ SOLN
40.0000 mg | Freq: Once | INTRAMUSCULAR | Status: DC
  Filled 2011-08-18: qty 4

## 2011-08-18 MED ORDER — LIDOCAINE-PRILOCAINE 2.5-2.5 % EX CREA
1.0000 "application " | TOPICAL_CREAM | CUTANEOUS | Status: DC | PRN
  Filled 2011-08-18: qty 5

## 2011-08-18 MED ORDER — SODIUM POLYSTYRENE SULFONATE 15 GM/60ML PO SUSP
30.0000 g | Freq: Once | ORAL | Status: AC
  Administered 2011-08-18: 30 g via ORAL
  Filled 2011-08-18: qty 120

## 2011-08-18 MED ORDER — SODIUM POLYSTYRENE SULFONATE 15 GM/60ML PO SUSP
30.0000 g | Freq: Once | ORAL | Status: AC
  Administered 2011-08-18: 30 g via RECTAL
  Filled 2011-08-18: qty 120

## 2011-08-18 MED ORDER — GLUCOSE 40 % PO GEL: 1.0000 | Freq: Once | ORAL | Status: AC | PRN

## 2011-08-18 MED ORDER — GLUCOSE 40 % PO GEL
ORAL | Status: AC
  Filled 2011-08-18: qty 1

## 2011-08-18 MED ORDER — PHENYTOIN SODIUM 50 MG/ML IJ SOLN
100.0000 mg | Freq: Three times a day (TID) | INTRAMUSCULAR | Status: DC
  Administered 2011-08-18 – 2011-08-25 (×22): 100 mg via INTRAVENOUS
  Filled 2011-08-18 (×29): qty 2

## 2011-08-18 MED ORDER — GLUCAGON HCL (RDNA) 1 MG IJ SOLR: 0.5000 mg | Freq: Once | INTRAMUSCULAR | Status: DC | PRN

## 2011-08-18 MED ORDER — DEXTROSE 50 % IV SOLN
INTRAVENOUS | Status: AC
  Filled 2011-08-18: qty 50

## 2011-08-18 MED ORDER — LIDOCAINE HCL (PF) 1 % IJ SOLN: 5.0000 mL | INTRAMUSCULAR | Status: DC | PRN

## 2011-08-18 MED ORDER — SODIUM CHLORIDE 0.9 % IV SOLN: 100.0000 mL | INTRAVENOUS | Status: DC | PRN

## 2011-08-18 MED ORDER — NEPRO/CARBSTEADY PO LIQD
237.0000 mL | ORAL | Status: DC | PRN
  Filled 2011-08-18: qty 237

## 2011-08-18 MED ORDER — GLUCAGON HCL (RDNA) 1 MG IJ SOLR: 1.0000 mg | Freq: Once | INTRAMUSCULAR | Status: AC | PRN

## 2011-08-18 MED ORDER — SODIUM POLYSTYRENE SULFONATE 15 GM/60ML PO SUSP
60.0000 g | Freq: Once | ORAL | Status: DC
  Filled 2011-08-18: qty 240

## 2011-08-18 MED ORDER — GLUCAGON HCL (RDNA) 1 MG IJ SOLR: 1.0000 mg | Freq: Once | INTRAMUSCULAR | Status: DC | PRN

## 2011-08-18 MED ORDER — SODIUM POLYSTYRENE SULFONATE 15 GM/60ML PO SUSP
30.0000 g | Freq: Once | ORAL | Status: DC
  Filled 2011-08-18: qty 120

## 2011-08-18 NOTE — Progress Notes (Signed)
SLP Cancellation Note   ST received order for BSE. Evaluation to be deferred to 08/19/11 Moreen Fowler MS, CCC-SLP [phone] [facility] 08/18/2011, 4:57 PM

## 2011-08-18 NOTE — Progress Notes (Signed)
Notified Dr. Isidoro Donning of patient's potassium of 6.6.  Orders in chart.

## 2011-08-18 NOTE — Consult Note (Signed)
[facility]  TEAM 4  Interim history: 78y/o female with DM 2, CKD, AOCD, HTN and ITP on low dose prednisone. She has had 1 seizure in  the past with a negative EEG.  She was admitted by surgery on 6/11 and underwent a ventral hernia repair.  Medicine was consulted due to a witnessed tonic clonic seizure on 6/17. We subsequentially consulted Neuro who has started Keppra for her.   Subjective: [name] more alert and following commands. Patient with bouts of confusion. Patient with no CP, NO sob.   Objective: Blood pressure 130/59, pulse 87, temperature 98.3 F (36.8 C), temperature source Oral, resp. rate 16, height 5\' 3"  (1.6 m), weight 59.875 kg (132 lb), SpO2 100.00%. Weight change:   Intake/Output Summary (Last 24 hours) at 08/18/11 1437 Last data filed at 08/18/11 0618  Gross per 24 hour  Intake    240 ml  Output    600 ml  Net   -360 ml   Physical Exam: General appearance: Alert, no distress, mittens on both hands HEENT- tongue has gray-brown coating Lungs: clear to auscultation bilaterally w/o wheeze Heart: regular rate and rhythm, S1, S2 normal, with 2/6 SEM murmur Abdomen: soft, tender, no bowel sounds, non- distended, incisions is clean. Staples removed Extremities: extremities normal, atraumatic, no cyanosis or edema Neuro: no focal deficits appreciable   Lab Results:  Basename 08/18/11 1205 08/18/11 0515 08/17/11 0705  NA -- 142 144  K 6.9* 6.6* --  CL -- 101 103  CO2 -- 35* 35*  GLUCOSE -- 135* 152*  BUN -- 58* 57*  CREATININE -- 2.84* 2.51*  CALCIUM -- 9.2 8.9  MG -- -- --  PHOS -- -- --    Basename 08/18/11 0515  AST 13  ALT 20  ALKPHOS 66  BILITOT 0.5  PROT 5.4*  ALBUMIN 2.7*    Basename 08/17/11 1010  WBC 13.0*  NEUTROABS --  HGB 10.4*  HCT 34.2*  MCV 101.5*  PLT 117*   Micro Results: Recent Results (from the past 240 hour(s))  URINE CULTURE     Status: Normal   Collection Time   08/14/11  5:39 PM      Component Value  Range Status Comment   Specimen Description URINE, CATHETERIZED   Final    Special Requests NONE   Final    Culture  Setup Time [id number]   Final    Colony Count NO GROWTH   Final    Culture NO GROWTH   Final    Report Status 08/15/2011 FINAL   Final      Assessment/Plan:  Generalized recurrent seizure As per Neuro - changed from keppra to dilantin  Posterior Reversible Encephalopathy Syndrome/HTN cont clonidine patch, metoprolol and hydralazine IV - she was previously on Atenolol and low dose PO clonidine Atenolol resumed and discontinued IV Metoprolol.  ITP she was receiving 10 mg of prednisone daily as an outpt - transitioned back to PO Prednisone yesterday.   ARF on CKD IV As per Nephrology - LASIX D/C'D. fOLLOW - pt is followed by Dr. Eliott Nine in oupt setting  Hypernatremia Improving. Follow.  Hypokalemia Resolved. Now hyperkalemic.  Hyperkalemia Secondary to oral supplementation. K= 6.6 this AM. Patient given 30g kayexalate and repeat potassium 6.9. Will give another dose of Kayexalate 30g x 1. Check EKG. Once we obtain access will give 1 amp of D50 and [place] insulin and calcium gluconate. Repeat BMET . Follow.  Pan sensitive E coli UTI/Pyelo As per urine culture dated  08/04/2011 - and repeat UA is negative- s/p Rocephin.  Ventral Hernia repair with post op ileus Per primary team (Gen Surgery)  DM2 CBG stable - follow trend with tapering of steroids  Dispo We will cont to follow along with you   08/18/2011, 2:37 PM  LOS: 12 days   Ramiro Harvest, MD [phone]

## 2011-08-18 NOTE — Progress Notes (Signed)
12 Days Post-Op  Subjective: MUCH MORE AWAKE TODAY.  Objective: Vital signs in last 24 hours: Temp:  [98 F (36.7 C)-98.4 F (36.9 C)] 98.3 F (36.8 C) (06/23 0616) Pulse Rate:  [87-92] 87  (06/23 0616) Resp:  [16-17] 16  (06/23 0616) BP: (126-161)/(48-71) 130/59 mmHg (06/23 0616) SpO2:  [95 %-100 %] 100 % (06/23 0616) Last BM Date: 08/17/11  Intake/Output from previous day: 06/22 0701 - 06/23 0700 In: 480 [P.O.:480] Out: 600 [Urine:600] Intake/Output this shift:    Incision/Wound:SOFT CLEAN DRY INTACT.  Bruising less today.   Much more awake and alert.  Lab Results:   Basename 08/17/11 1010  WBC 13.0*  HGB 10.4*  HCT 34.2*  PLT 117*   BMET  Basename 08/18/11 0515 08/17/11 0705  NA 142 144  K 6.6* 4.9  CL 101 103  CO2 35* 35*  GLUCOSE 135* 152*  BUN 58* 57*  CREATININE 2.84* 2.51*  CALCIUM 9.2 8.9   PT/INR  Basename 08/17/11 1010  LABPROT 12.3  INR 0.90   ABG No results found for this basename: PHART:2,PCO2:2,PO2:2,HCO3:2 in the last 72 hours  Studies/Results: No results found.  Anti-infectives: Anti-infectives     Start     Dose/Rate Route Frequency Ordered Stop   08/14/11 1800   cefTRIAXone (ROCEPHIN) 1 g in dextrose 5 % 50 mL IVPB  Status:  Discontinued        1 g 100 mL/hr over 30 Minutes Intravenous Every 24 hours 08/14/11 1706 08/16/11 1354   08/06/11 1400   ceFAZolin (ANCEF) IVPB 1 g/50 mL premix  Status:  Discontinued        1 g 100 mL/hr over 30 Minutes Intravenous 60 min pre-op 08/06/11 1313 08/06/11 1800          Assessment/Plan: s/p Procedure(s) (LRB): HERNIA REPAIR VENTRAL ADULT (N/A) Deconditioned continue nutritional supplementation.  Needs swallowing evaluation CRI stable slight increase in creatinine.  UOP OK.   Hyperkalemia  Medicine following.  Recheck K level later today per medicine. Seizure disorder  Followed by neurology.  Appears stable.  Much more awake today. Bruising  Looks a little less.  May need hematology  input.  Leave off chemical DVT  Prevention since concerned about bleeding risk.  SCD.  LOS: 12 days    Slyvia Lartigue A. 08/18/2011

## 2011-08-18 NOTE — Progress Notes (Signed)
Subjective:  Asked by Dr. Janee Morn to reestablish our consult status.  Labs looked great yesterday , but today creatinine a little worse and K was 6.6 (had been receiving repletion).  Lasix was stopped yesterday, K was stopped this AM.  UOP down some Objective Vital signs in last 24 hours: Filed Vitals:   08/17/11 1454 08/17/11 2127 08/18/11 0218 08/18/11 0616  BP: 145/70 161/71 126/48 130/59  Pulse: 88 88 92 87  Temp: 98 F (36.7 C) 98.4 F (36.9 C) 98.2 F (36.8 C) 98.3 F (36.8 C)  TempSrc: Oral Oral    Resp: 16 17 16 16   Height:      Weight:      SpO2: 95% 100% 96% 100%   Weight change:   Intake/Output Summary (Last 24 hours) at 08/18/11 0934 Last data filed at 08/18/11 0618  Gross per 24 hour  Intake    240 ml  Output    600 ml  Net   -360 ml   Labs: Basic Metabolic Panel:  Lab 08/18/11 1610 08/17/11 0705 08/16/11 0400 08/15/11 0335 08/13/11 0410 08/11/11 1057  NA 142 144 154* -- -- --  K 6.6* 4.9 3.2* -- -- --  CL 101 103 110 -- -- --  CO2 35* 35* 36* -- -- --  GLUCOSE 135* 152* 95 -- -- --  BUN 58* 57* 62* -- -- --  CREATININE 2.84* 2.51* 2.56* -- -- --  CALCIUM 9.2 8.9 8.9 -- -- --  ALB -- -- -- -- -- --  PHOS -- -- -- 3.2 3.7 2.9   Liver Function Tests:  Lab 08/18/11 0515 08/15/11 0335 08/13/11 0410 08/12/11 2119  AST 13 -- -- 17  ALT 20 -- -- 12  ALKPHOS 66 -- -- 37*  BILITOT 0.5 -- -- 0.9  PROT 5.4* -- -- 5.2*  ALBUMIN 2.7* 2.8* 2.7* --   No results found for this basename: LIPASE:3,AMYLASE:3 in the last 168 hours  Lab 08/12/11 1800  AMMONIA 13   CBC:  Lab 08/17/11 1010 08/12/11 1646 08/12/11 0656 08/11/11 1057  WBC 13.0* 9.8 7.8 --  NEUTROABS -- -- 6.7 7.0  HGB 10.4* 9.7* 9.3* --  HCT 34.2* 31.1* 29.3* --  MCV 101.5* 99.0 97.7 97.7  PLT 117* 132* 110* --   Cardiac Enzymes:  Lab 08/13/11 1412 08/13/11 0410 08/13/11 0208 08/12/11 2119 08/12/11 1848  CKTOTAL 37 39 43 39 38  CKMB 3.5 3.8 3.8 3.8 3.6  CKMBINDEX -- -- -- -- --  TROPONINI  0.46* 0.64* 0.64* 0.55* 0.67*   CBG:  Lab 08/18/11 0410 08/18/11 0011 08/17/11 2018 08/17/11 1609 08/17/11 1206  GLUCAP 135* 144* 102* 135* 168*    Iron Studies:  No results found for this basename: IRON,TIBC,TRANSFERRIN,FERRITIN in the last 72 hours Studies/Results: No results found. Medications: Infusions:    . dextrose 75 mL/hr at 08/17/11 0253    Scheduled Medications:    . antiseptic oral rinse  15 mL Mouth Rinse q12n4p  . atenolol  25 mg Oral BID  . bisacodyl  10 mg Rectal Daily  . chlorhexidine  15 mL Mouth Rinse BID  . cloNIDine  0.3 mg Transdermal Weekly  . darbepoetin (ARANESP) injection - NON-DIALYSIS  60 mcg Subcutaneous Q Sat-1800  . hydrALAZINE  10 mg Oral Q6H  . insulin aspart  0-9 Units Subcutaneous Q4H  . lip balm  1 application Topical BID  . pantoprazole  40 mg Oral Q1200  . phenytoin  100 mg Oral TID  . predniSONE  20 mg Oral QAC breakfast  . sodium polystyrene  30 g Oral Once  . DISCONTD: furosemide  40 mg Intravenous Q12H  . DISCONTD: furosemide  40 mg Oral BID  . DISCONTD: hydrALAZINE  10 mg Intravenous Q6H  . DISCONTD: hydrocortisone sod succinate (SOLU-CORTEF) injection  25 mg Intravenous Daily  . DISCONTD: pantoprazole (PROTONIX) IV  40 mg Intravenous QHS  . DISCONTD: phenytoin  100 mg Oral TID  . DISCONTD: potassium chloride  40 mEq Oral BID    have reviewed scheduled and prn medications.  Physical Exam: General: more alert HEART- RRR ungs: mostly clr Abdomen: softer  Extremities: dependant edema.  Has left upper arm AVF, good thrill and bruit  I Assessment/ Plan: Pt is a 78 y.o. yo female who was admitted on 08/06/2011 with an incarcerated ventral hernia, s/p surgical repair.  complicated by acute on chronic renal failure  Assessment/Plan: 1. ARF on Chronic kidney disease stage IV: Baseline CKD, creatinine in the high 2's , has access in place. ARF due to ATN vs hypoperfusion. Creat had been stable with adequate UOP.  Unfortunately,  status a little worse today.   no indications for dialysis if K will come down with medical management. Hopefully renal function will continue to hang in there.  2. AMS- better.  now s/p ?sz/encephalopathy.  Neuro on board 3. Hypertension: Much better control with current measures.   4. Anemia- stable to improved, on aranesp 5. Metabolic bone disease: phos WNL 6. S/p ventral hernia repair with ileus- better, pt reports BM and taking POS 7. Hypokalemia- now overcorrected.  supps stopped and got kaexylate.    8. Hypernatremia- better on D5W 9. We will continue to follow.  FYI, pt has follow up appt with Dr. Eliott Nine July 19 at [facility].    Chantal Worthey A   08/18/2011,9:34 AM  LOS: 12 days

## 2011-08-18 NOTE — Evaluation (Signed)
Occupational Therapy Evaluation Patient Details Name: [name] MRN: [id number] DOB: 15-Oct-1932 Today's Date: 08/18/2011 Time:  -     OT Assessment / Plan / Recommendation Clinical Impression  Pt demos decline in function with strength, balance, safety and activity tolerance for ADLs and ADL mobility safety. Pt would benefri from skilled OT sercvies to address these impairments to restore PLOF    OT Assessment  Patient needs continued OT Services    Follow Up Recommendations  Inpatient Rehab;Skilled nursing facility;Other (comment) (Family would like to have HH)    Barriers to Discharge Other (comment) (Uncertain if family will be able to provide adequate assist) Pt's dtr stated that they plan to take pt home from acute setting and will rpovide care for her and plan to have [facility] therapy. Uncertain that this will be realistic based upon pt's extensive need for assist/weakness  Equipment Recommendations       Recommendations for Other Services Rehab consult (depending on progress on acute)  Frequency  Min 2X/week    Precautions / Restrictions Precautions Precautions: Fall Restrictions Weight Bearing Restrictions: No   Pertinent Vitals/Pain     ADL  Grooming: Performed;Wash/dry hands;Wash/dry face;Moderate assistance Where Assessed - Grooming: Supported sitting Upper Body Bathing: Simulated;Maximal assistance Where Assessed - Upper Body Bathing: Supported sitting Lower Body Bathing: Maximal assistance;Simulated Where Assessed - Lower Body Bathing: Supported sitting Upper Body Dressing: Performed;Maximal assistance Where Assessed - Upper Body Dressing: Supported sitting Lower Body Dressing: Performed;Maximal assistance Where Assessed - Lower Body Dressing: Supported sitting    OT Diagnosis: Generalized weakness  OT Problem List: Decreased strength;Decreased cognition;Decreased safety awareness;Decreased activity tolerance;Impaired balance (sitting and/or standing);Decreased  knowledge of use of DME or AE;Decreased knowledge of precautions;Impaired UE functional use;Increased edema OT Treatment Interventions: Self-care/ADL training;Therapeutic activities;Therapeutic exercise;Neuromuscular education;DME and/or AE instruction;Patient/family education;Balance training   OT Goals Acute Rehab OT Goals OT Goal Formulation: With patient Time For Goal Achievement: 08/25/11 Potential to Achieve Goals: Good ADL Goals Pt Will Perform Grooming: with min assist;Sitting, edge of bed ADL Goal: Grooming - Progress: Goal set today Pt Will Perform Upper Body Bathing: with mod assist;Sitting, edge of bed ADL Goal: Upper Body Bathing - Progress: Goal set today Pt Will Perform Lower Body Bathing: with mod assist;Sitting, edge of bed ADL Goal: Lower Body Bathing - Progress: Goal set today Pt Will Perform Upper Body Dressing: with mod assist;Sitting, bed ADL Goal: Upper Body Dressing - Progress: Goal set today Pt Will Perform Lower Body Dressing: Sitting, bed;Sit to stand from bed;with mod assist ADL Goal: Lower Body Dressing - Progress: Goal set today  Visit Information  Last OT Received On: 08/18/11    Subjective Data  Subjective: " I feel so itchy " Patient Stated Goal: To return home   Prior Functioning  Home Living Lives With: Son Available Help at Discharge: Family;Available 24 hours/day Type of Home: House Home Access: Level entry Home Layout: One level Bathroom Shower/Tub: Engineer, manufacturing systems: Standard Bathroom Accessibility: Yes Home Adaptive Equipment: Tub transfer bench;Walker - rolling;Straight cane;Bedside commode/3-in-1 Prior Function Level of Independence: Independent Driving: No Vocation: Retired Musician: HOH Dominant Hand: Right    Cognition  Overall Cognitive Status: Impaired Area of Impairment: Memory;Following commands;Awareness of deficits Arousal/Alertness: Awake/alert Orientation Level: Disoriented  to;Place;Time;Situation Following Commands: Follows one step commands with increased time    Extremity/Trunk Assessment Right Upper Extremity Assessment RUE ROM/Strength/Tone: WFL for tasks assessed (AROM WFL, strength decreased 3/5) RUE Sensation: WFL - Light Touch RUE Coordination: WFL - gross/fine motor Left  Upper Extremity Assessment LUE ROM/Strength/Tone: WFL for tasks assessed (AROM WFL, strength decreased 3/5) LUE Sensation: WFL - Light Touch LUE Coordination: WFL - gross/fine motor   Mobility Bed Mobility Bed Mobility: Left Sidelying to Sit;Supine to Sit;Sitting - Scoot to Edge of Bed;Sit to Supine Left Sidelying to Sit: 2: Max assist Supine to Sit: 2: Max assist Sitting - Scoot to [place] of Bed: 2: Max assist Sit to Supine: 2: Max assist Scooting to [facility]: 2: Max assist Scooting to [facility]: Patient Percentage: 10% Transfers Transfers: Not assessed       Balance Balance Balance Assessed: Yes Static Sitting Balance Static Sitting - Balance Support: Bilateral upper extremity supported Static Sitting - Level of Assistance: 3: Mod assist Dynamic Sitting Balance Dynamic Sitting - Balance Support: Bilateral upper extremity supported;During functional activity Dynamic Sitting - Level of Assistance: 3: Mod assist Dynamic Sitting - Balance Activities: Reaching for objects;Reaching across midline;Head control activities;Trunk control activities  End of Session OT - End of Session Activity Tolerance: Patient limited by fatigue Patient left: in bed;with call bell/phone within reach;with bed alarm set;with family/visitor present   Galen Manila 08/18/2011, 11:03 AM

## 2011-08-18 NOTE — Plan of Care (Signed)
Problem: Phase II Progression Outcomes Goal: Pain controlled Pt very weak and deconditioned. Pt's family expressed desire to take her home and care for her, however, pt may need more extensive therapy than HH can offer at this time

## 2011-08-19 ENCOUNTER — Inpatient Hospital Stay (HOSPITAL_COMMUNITY): Payer: Medicare Other

## 2011-08-19 DIAGNOSIS — R569 Unspecified convulsions: Secondary | ICD-10-CM

## 2011-08-19 DIAGNOSIS — N179 Acute kidney failure, unspecified: Secondary | ICD-10-CM | POA: Clinically undetermined

## 2011-08-19 DIAGNOSIS — R6521 Severe sepsis with septic shock: Secondary | ICD-10-CM

## 2011-08-19 DIAGNOSIS — J9600 Acute respiratory failure, unspecified whether with hypoxia or hypercapnia: Secondary | ICD-10-CM | POA: Diagnosis not present

## 2011-08-19 DIAGNOSIS — A419 Sepsis, unspecified organism: Secondary | ICD-10-CM | POA: Diagnosis present

## 2011-08-19 DIAGNOSIS — Z9889 Other specified postprocedural states: Secondary | ICD-10-CM

## 2011-08-19 DIAGNOSIS — E875 Hyperkalemia: Secondary | ICD-10-CM

## 2011-08-19 DIAGNOSIS — N189 Chronic kidney disease, unspecified: Secondary | ICD-10-CM | POA: Clinically undetermined

## 2011-08-19 DIAGNOSIS — I959 Hypotension, unspecified: Secondary | ICD-10-CM | POA: Diagnosis not present

## 2011-08-19 DIAGNOSIS — R6520 Severe sepsis without septic shock: Secondary | ICD-10-CM

## 2011-08-19 DIAGNOSIS — K7290 Hepatic failure, unspecified without coma: Secondary | ICD-10-CM

## 2011-08-19 DIAGNOSIS — G9340 Encephalopathy, unspecified: Secondary | ICD-10-CM | POA: Diagnosis present

## 2011-08-19 DIAGNOSIS — G40401 Other generalized epilepsy and epileptic syndromes, not intractable, with status epilepticus: Secondary | ICD-10-CM

## 2011-08-19 LAB — GLUCOSE, RANDOM: Glucose, Bld: 93 mg/dL (ref 70–99)

## 2011-08-19 LAB — CBC
HCT: 31.6 % — ABNORMAL LOW (ref 36.0–46.0)
MCH: 30.6 pg (ref 26.0–34.0)
Platelets: 100 10*3/uL — ABNORMAL LOW (ref 150–400)
Platelets: 95 10*3/uL — ABNORMAL LOW (ref 150–400)
RBC: 2.94 MIL/uL — ABNORMAL LOW (ref 3.87–5.11)
RDW: 15.7 % — ABNORMAL HIGH (ref 11.5–15.5)
RDW: 16.1 % — ABNORMAL HIGH (ref 11.5–15.5)
WBC: 11.3 10*3/uL — ABNORMAL HIGH (ref 4.0–10.5)
WBC: 10.3 10*3/uL (ref 4.0–10.5)

## 2011-08-19 LAB — COMPREHENSIVE METABOLIC PANEL
Alkaline Phosphatase: 64 U/L (ref 39–117)
BUN: 37 mg/dL — ABNORMAL HIGH (ref 6–23)
Chloride: 101 mEq/L (ref 96–112)
GFR calc Af Amer: 18 mL/min — ABNORMAL LOW (ref >90)
GFR calc non Af Amer: 15 mL/min — ABNORMAL LOW (ref >90)
Glucose, Bld: 82 mg/dL (ref 70–99)
Potassium: 5.3 mEq/L — ABNORMAL HIGH (ref 3.5–5.1)
Total Bilirubin: 0.6 mg/dL (ref 0.3–1.2)
Total Protein: 4.8 g/dL — ABNORMAL LOW (ref 6.0–8.3)

## 2011-08-19 LAB — GLUCOSE, CAPILLARY
Glucose-Capillary: 102 mg/dL — ABNORMAL HIGH (ref 70–99)
Glucose-Capillary: 107 mg/dL — ABNORMAL HIGH (ref 70–99)
Glucose-Capillary: 83 mg/dL (ref 70–99)
Glucose-Capillary: >600 mg/dL (ref 70–99)

## 2011-08-19 LAB — BLOOD GAS, ARTERIAL
Acid-Base Excess: 7.7 mmol/L — ABNORMAL HIGH (ref 0.0–2.0)
Bicarbonate: 32 mEq/L — ABNORMAL HIGH (ref 20.0–24.0)
Drawn by: 305991
Drawn by: 330991
FIO2: 1 %
O2 Saturation: 100 %
PEEP: 5 cmH2O
RATE: 15 resp/min
pCO2 arterial: 45.4 mmHg — ABNORMAL HIGH (ref 35.0–45.0)
pH, Arterial: 7.536 — ABNORMAL HIGH (ref 7.350–7.400)
pO2, Arterial: 245 mmHg — ABNORMAL HIGH (ref 80.0–100.0)
pO2, Arterial: 259 mmHg — ABNORMAL HIGH (ref 80.0–100.0)

## 2011-08-19 LAB — URINALYSIS, ROUTINE W REFLEX MICROSCOPIC
Ketones, ur: 15 mg/dL — AB
Nitrite: NEGATIVE
Protein, ur: >300 mg/dL — AB
Urobilinogen, UA: 0.2 mg/dL (ref 0.0–1.0)

## 2011-08-19 LAB — BASIC METABOLIC PANEL
CO2: 32 mEq/L (ref 19–32)
Chloride: 101 mEq/L (ref 96–112)
Chloride: 101 mEq/L (ref 96–112)
Creatinine, Ser: 2.26 mg/dL — ABNORMAL HIGH (ref 0.50–1.10)
Creatinine, Ser: 2.65 mg/dL — ABNORMAL HIGH (ref 0.50–1.10)
GFR calc Af Amer: 23 mL/min — ABNORMAL LOW (ref >90)
GFR calc Af Amer: 19 mL/min — ABNORMAL LOW (ref >90)
GFR calc non Af Amer: 20 mL/min — ABNORMAL LOW (ref >90)
Potassium: 5.5 mEq/L — ABNORMAL HIGH (ref 3.5–5.1)
Sodium: 142 mEq/L (ref 135–145)

## 2011-08-19 LAB — DIFFERENTIAL
Basophils Absolute: 0 10*3/uL (ref 0.0–0.1)
Eosinophils Absolute: 0.1 10*3/uL (ref 0.0–0.7)
Lymphs Abs: 0.5 10*3/uL — ABNORMAL LOW (ref 0.7–4.0)
Neutrophils Relative %: 90 % — ABNORMAL HIGH (ref 43–77)

## 2011-08-19 LAB — PHENYTOIN LEVEL, TOTAL: Phenytoin Lvl: 10 ug/mL (ref 10.0–20.0)

## 2011-08-19 LAB — PROTIME-INR
INR: 1.05 (ref 0.00–1.49)
Prothrombin Time: 13.9 seconds (ref 11.6–15.2)

## 2011-08-19 LAB — MAGNESIUM: Magnesium: 1.7 mg/dL (ref 1.5–2.5)

## 2011-08-19 LAB — PHOSPHORUS: Phosphorus: 3.6 mg/dL (ref 2.3–4.6)

## 2011-08-19 LAB — AMYLASE: Amylase: 188 U/L — ABNORMAL HIGH (ref 0–105)

## 2011-08-19 LAB — OCCULT BLOOD X 1 CARD TO LAB, STOOL: Fecal Occult Bld: NEGATIVE

## 2011-08-19 LAB — PRO B NATRIURETIC PEPTIDE: Pro B Natriuretic peptide (BNP): 8892 pg/mL — ABNORMAL HIGH (ref 0–450)

## 2011-08-19 LAB — CARDIAC PANEL(CRET KIN+CKTOT+MB+TROPI)
CK, MB: 13 ng/mL (ref 0.3–4.0)
Relative Index: 1.6 (ref 0.0–2.5)
Troponin I: <0.3 ng/mL (ref <0.30)

## 2011-08-19 LAB — PROCALCITONIN: Procalcitonin: 1.46 ng/mL

## 2011-08-19 LAB — LACTIC ACID, PLASMA: Lactic Acid, Venous: 1.3 mmol/L (ref 0.5–2.2)

## 2011-08-19 LAB — HEPATITIS B SURFACE ANTIGEN: Hepatitis B Surface Ag: NEGATIVE

## 2011-08-19 LAB — URINE MICROSCOPIC-ADD ON

## 2011-08-19 MED ORDER — SODIUM CHLORIDE 0.9 % IV BOLUS (SEPSIS): 500.0000 mL | INTRAVENOUS | Status: DC | PRN

## 2011-08-19 MED ORDER — PROPOFOL 10 MG/ML IV EMUL: 5.0000 ug/kg/min | INTRAVENOUS | Status: DC

## 2011-08-19 MED ORDER — FENTANYL CITRATE 0.05 MG/ML IJ SOLN: 25.0000 ug | INTRAMUSCULAR | Status: DC | PRN

## 2011-08-19 MED ORDER — SODIUM CHLORIDE 0.9 % IV SOLN: 1000.0000 mg | Freq: Once | INTRAVENOUS | Status: DC

## 2011-08-19 MED ORDER — PIPERACILLIN-TAZOBACTAM IN DEX 2-0.25 GM/50ML IV SOLN
2.2500 g | Freq: Three times a day (TID) | INTRAVENOUS | Status: DC
  Administered 2011-08-19 – 2011-08-21 (×6): 2.25 g via INTRAVENOUS
  Filled 2011-08-19 (×8): qty 50

## 2011-08-19 MED ORDER — LORAZEPAM 2 MG/ML IJ SOLN
INTRAMUSCULAR | Status: AC
  Administered 2011-08-19: 2 mg via INTRAVENOUS
  Filled 2011-08-19: qty 1

## 2011-08-19 MED ORDER — MIDAZOLAM HCL 2 MG/2ML IJ SOLN
INTRAMUSCULAR | Status: AC
  Filled 2011-08-19: qty 2

## 2011-08-19 MED ORDER — SODIUM CHLORIDE 0.9 % IV BOLUS (SEPSIS): 500.0000 mL | Freq: Once | INTRAVENOUS | Status: AC

## 2011-08-19 MED ORDER — FENTANYL CITRATE 0.05 MG/ML IJ SOLN
INTRAMUSCULAR | Status: AC
  Filled 2011-08-19: qty 2

## 2011-08-19 MED ORDER — LORAZEPAM 2 MG/ML IJ SOLN
2.0000 mg | Freq: Once | INTRAMUSCULAR | Status: AC
  Administered 2011-08-19: 2 mg via INTRAVENOUS

## 2011-08-19 MED ORDER — MIDAZOLAM HCL 2 MG/2ML IJ SOLN
INTRAMUSCULAR | Status: AC
  Administered 2011-08-19: 2 mg via INTRAVENOUS
  Filled 2011-08-19: qty 2

## 2011-08-19 MED ORDER — LORAZEPAM 2 MG/ML IJ SOLN
1.0000 mg | Freq: Once | INTRAMUSCULAR | Status: DC
  Filled 2011-08-19: qty 1

## 2011-08-19 MED ORDER — SODIUM CHLORIDE 0.9 % IV SOLN
250.0000 mL | INTRAVENOUS | Status: DC | PRN
  Administered 2011-08-20: 10 mL/h via INTRAVENOUS

## 2011-08-19 MED ORDER — MIDAZOLAM HCL 2 MG/2ML IJ SOLN
2.0000 mg | Freq: Once | INTRAMUSCULAR | Status: AC
  Administered 2011-08-19: 2 mg via INTRAVENOUS

## 2011-08-19 MED ORDER — MIDAZOLAM HCL 2 MG/2ML IJ SOLN
1.0000 mg | INTRAMUSCULAR | Status: DC | PRN
  Administered 2011-08-20: 2 mg via INTRAVENOUS
  Administered 2011-08-20: 1 mg via INTRAVENOUS
  Filled 2011-08-19 (×2): qty 2

## 2011-08-19 MED ORDER — VANCOMYCIN HCL IN DEXTROSE 1-5 GM/200ML-% IV SOLN
1000.0000 mg | INTRAVENOUS | Status: DC
  Administered 2011-08-19: 1000 mg via INTRAVENOUS
  Filled 2011-08-19 (×2): qty 200

## 2011-08-19 MED ORDER — PANTOPRAZOLE SODIUM 40 MG IV SOLR
40.0000 mg | Freq: Every day | INTRAVENOUS | Status: DC
  Administered 2011-08-19 – 2011-08-20 (×2): 40 mg via INTRAVENOUS
  Filled 2011-08-19 (×3): qty 40

## 2011-08-19 MED ORDER — ALBUTEROL SULFATE (5 MG/ML) 0.5% IN NEBU
2.5000 mg | INHALATION_SOLUTION | Freq: Four times a day (QID) | RESPIRATORY_TRACT | Status: DC
  Administered 2011-08-19 – 2011-08-26 (×28): 2.5 mg via RESPIRATORY_TRACT
  Filled 2011-08-19 (×28): qty 0.5

## 2011-08-19 MED ORDER — VALPROATE SODIUM 500 MG/5ML IV SOLN
500.0000 mg | Freq: Two times a day (BID) | INTRAVENOUS | Status: DC
  Administered 2011-08-19 – 2011-08-26 (×14): 500 mg via INTRAVENOUS
  Filled 2011-08-19 (×15): qty 5

## 2011-08-19 MED ORDER — IPRATROPIUM BROMIDE 0.02 % IN SOLN
0.5000 mg | Freq: Four times a day (QID) | RESPIRATORY_TRACT | Status: DC
  Administered 2011-08-19 – 2011-08-26 (×28): 0.5 mg via RESPIRATORY_TRACT
  Filled 2011-08-19 (×28): qty 2.5

## 2011-08-19 MED ORDER — VALPROATE SODIUM 500 MG/5ML IV SOLN
1000.0000 mg | Freq: Once | INTRAVENOUS | Status: AC
  Administered 2011-08-19: 1000 mg via INTRAVENOUS
  Filled 2011-08-19: qty 10

## 2011-08-19 NOTE — Procedures (Signed)
ENDOTRACHEAL INTUBATION  INDICATION: AMS after presumed seizure and multiple doses of lorazepam  PROCEDURE: time out performed. All necessary equipment verified. #3 MAC blade. Cords easily visualized. 7.0 ETT passed on first attempt. Position confirmed with EZ Cap and auscultation. F/U CXR pending. No complications   Billy Fischer, MD ; [facility] [phone].  After 5:30 PM or weekends, call [phone]

## 2011-08-19 NOTE — Progress Notes (Signed)
PT Cancellation Note  Treatment cancelled today due to medical issues with patient which prohibited therapy. Noted PT orders cancelled and pt transferred to 2100. Please re-order when pt medically appropriate for therapy.   [facility] [place] 08/19/2011, 12:55 PM Pager: [phone]

## 2011-08-19 NOTE — Progress Notes (Signed)
 [facility]  Subjective:  Intubated, sedated following "seizure" earlier today   Objective: Vital signs in last 24 hours: Blood pressure 77/45, pulse 89, temperature 98.2 F (36.8 C), temperature source Axillary, resp. rate 31, height 5\' 3"  (1.6 m), weight 62.3 kg (137 lb 5.6 oz), SpO2 100.00%.    PHYSICAL EXAM General--intubated, sedated Chest--rhonchi Heart--no rub Abd--post op, nontender Extr--trace edema, AVF patent in L upper arm  Melenic stool is present--hemocult pending  Lab Results:   Lab 08/19/11 1354 08/19/11 0930 08/19/11 0437 08/15/11 0335 08/13/11 0410  NA 142 142 141 -- --  K 5.3* 5.5* 4.9 -- --  CL 101 101 101 -- --  CO2 32 32 34* -- --  BUN 37* 36* 34* -- --  CREATININE 2.78* 2.65* 2.26* -- --  ALB -- -- -- -- --  GLUCOSE 82 -- -- -- --  CALCIUM 8.5 8.6 8.8 -- --  PHOS 3.6 -- -- 3.2 3.7     Basename 08/19/11 1354 08/19/11 0437  WBC 10.3 11.3*  HGB 9.0* 9.4*  HCT 30.2* 31.6*  PLT 95* 100*    Assessment/Plan: 1.  ARF on Chronic kidney disease stage IV: Baseline CKD, creatinine in the high 2.8 today , has access in place. ARF due to ATN vs hypoperfusion. Creat had been stable with adequate UOP. Dialyzed last night for high K 2.  AMS/Seizure- post ictal/sedated, neuro to see.  Curently intubated 3.  Hypertension--BP low currently, CVP 6-will bolus 500 cc NS  4.  Anemia- on aranesp, Fe/TIBC 36%, ferritin 869 on 15 Jun, hemocult pending 5.  Metabolic bone disease: phos WNL, check PTH  6.  S/p ventral hernia repair with ileus-per CCS 7.  Hyperkalemia-observe for now 8.   Hypernatremia-improved    LOS: 13 days   Sharlee Rufino F 08/19/2011,3:17 PM   .labalb

## 2011-08-19 NOTE — Progress Notes (Signed)
Nutrition Follow-up  Diet Order:  NPO  Pt with significant change in status this afternoon.  RD noted pt diet was advanced to low fiber over the weekend with 5-80% of meals consumed.  Some intermittent confusion noted by daughter.  Pt was made NPO this am due to somnolence and an SLP evaluation for diet safety was made.  SLP recommended continue NPO due to unresponsiveness. Pt transferred to ICU due to seizure-like activity with difficulty breathing. Pt now intubated without access for enteral feeds at this time. MVe: 6.6 L/min Tm: 37.1 No diprivan infusing.   Pt with stage 4 CKD. Graft in left arm, received hemodialysis overnight.  Electrolytes abnormal even after stopping replacement therapy and HD. Pt on IV dilantin.  Pt noted to be at nutrition risk due to wt loss of ~6% of usual weight in 5 months, fragile skin, and prolonged NPO status.  Pt has continued with poor PO intake, wt likely masked by fluid status.  Pt is on lasix. Pt remains at risk.  Will continue to follow.  Meds: Scheduled Meds:   . albuterol  2.5 mg Nebulization Q6H   And  . ipratropium  0.5 mg Nebulization Q6H  . bisacodyl  10 mg Rectal Daily  . calcium gluconate  1 g Intravenous Once  . dextrose      . dextrose  1 ampule Intravenous Once  . dextrose      . dextrose      . fentaNYL      . insulin aspart  10 Units Subcutaneous Once  . LORazepam  2 mg Intravenous Once  . midazolam  2 mg Intravenous Once  . pantoprazole (PROTONIX) IV  40 mg Intravenous QHS  . phenytoin (DILANTIN) IV  100 mg Intravenous Q8H  . piperacillin-tazobactam (ZOSYN)  IV  2.25 g Intravenous Q8H  . sodium chloride  500 mL Intravenous Once  . sodium polystyrene  30 g Rectal Once  . valproate sodium  1,000 mg Intravenous Once  . valproate sodium  500 mg Intravenous Q12H  . vancomycin  1,000 mg Intravenous Q48H  . DISCONTD: antiseptic oral rinse  15 mL Mouth Rinse q12n4p  . DISCONTD: atenolol  25 mg Oral BID  . DISCONTD: chlorhexidine  15  mL Mouth Rinse BID  . DISCONTD: cloNIDine  0.3 mg Transdermal Weekly  . DISCONTD: darbepoetin (ARANESP) injection - NON-DIALYSIS  60 mcg Subcutaneous Q Sat-1800  . DISCONTD: furosemide  40 mg Intravenous Once  . DISCONTD: hydrALAZINE  10 mg Intravenous Q6H  . DISCONTD: hydrALAZINE  10 mg Oral Q6H  . DISCONTD: insulin aspart  0-9 Units Subcutaneous Q4H  . DISCONTD: lip balm  1 application Topical BID  . DISCONTD: LORazepam  1 mg Intravenous Once  . DISCONTD: pantoprazole  40 mg Oral Q1200  . DISCONTD: phenytoin (DILANTIN) IV  1,000 mg Intravenous Once  . DISCONTD: phenytoin  100 mg Oral TID  . DISCONTD: predniSONE  20 mg Oral QAC breakfast  . DISCONTD: sodium polystyrene  30 g Oral Once   Continuous Infusions:   . propofol    . DISCONTD: dextrose 75 mL/hr at 08/17/11 0253  . DISCONTD: dextrose    . DISCONTD: dextrose    . DISCONTD: dextrose    . DISCONTD: dextrose 5 % and 0.9% NaCl     PRN Meds:.sodium chloride, alteplase, dextrose, dextrose, dextrose, dextrose, fentaNYL, glucagon, glucagon, midazolam, sodium chloride, DISCONTD: sodium chloride, DISCONTD: sodium chloride, DISCONTD: dextrose, DISCONTD: dextrose, DISCONTD: dextrose, DISCONTD: feeding supplement (NEPRO CARB STEADY), DISCONTD:  glucagon, DISCONTD: glucagon, DISCONTD: glucose-Vitamin C, DISCONTD: heparin, DISCONTD: lidocaine, DISCONTD: lidocaine-prilocaine DISCONTD: magic mouthwash, DISCONTD: menthol-cetylpyridinium, DISCONTD: ondansetron, DISCONTD: pentafluoroprop-tetrafluoroeth  Labs:  CMP     Component Value Date/Time   NA 142 08/19/2011 0930   K 5.5* 08/19/2011 0930   CL 101 08/19/2011 0930   CO2 32 08/19/2011 0930   GLUCOSE 96 08/19/2011 0930   BUN 36* 08/19/2011 0930   CREATININE 2.65* 08/19/2011 0930   CALCIUM 8.6 08/19/2011 0930   PROT 5.4* 08/18/2011 0515   ALBUMIN 2.7* 08/18/2011 0515   AST 13 08/18/2011 0515   ALT 20 08/18/2011 0515   ALKPHOS 66 08/18/2011 0515   BILITOT 0.5 08/18/2011 0515   GFRNONAA 16* 08/19/2011  0930   GFRAA 19* 08/19/2011 0930   Lipase     Component Value Date/Time   LIPASE 474* 08/19/2011 1354    Amylase    Component Value Date/Time   AMYLASE 188* 08/19/2011 1354    Intake/Output Summary (Last 24 hours) at 08/19/11 1433 Last data filed at 08/19/11 0601  Gross per 24 hour  Intake      0 ml  Output     10 ml  Net    -10 ml   Pt with decreased UOP yesterday, 200 mL output so far today. Pt receiving fluid boluses for BP.  Weight Status:  137 lbs, Stable Admission wt: 132 lbs BMI: 23.4  Re-estimated needs:  1160-1290 kcal, 50-62g protein  Nutrition Dx:  Inadequate oral intake, ongoing now r/t mechanical ventilation  Intervention:   1.  Enteral nutrition; if pt remains intubated, recommend initiation of nutrition support within 24-48 hrs.  Due to elevated electrolytes with adequate but decreasing UOP, recommend Nepro @ 10 mL/hr.  Advance by 10 mL q 8 hrs to 30 mL goal.  Will monitor for need for renal formula.  Monitor:   1.  Enteral nutrition; initiation with tolerance if pt to remain intubated >48 hrs 2. Food/Beverage; if pt extubated, PO diet per SLP recommendations. 3.  Labs; nutrition-related labs and need for specialty formula.   Hoyt Koch Pager #:  [phone]

## 2011-08-19 NOTE — Progress Notes (Signed)
RN called to patient room by PA. Patient noted to be in slight respiratory distress and mental status change from morning assessment. Oxygen saturations obtained and were mid 90's on 2 liters via nasal cannula. Patient's breathing noted to be "tugging" and she seemed to be having trouble getting enough air. Patient not verbally responding to RN or PA.  PA and surgical NP at bedside giving verbal orders. Non rebreather placed on patient at 100%. Oxygen sats at 100%, still in visual distress with breathing. Rapid response nurse notified and came to bedside to assist with care. EKG was obtained and shared with PA and NP at bedside. Patient transferred to 2113 on non rebreather in place with sats still at 100% and report was given at the bedside. Daughter present at bedside before and after transfer and updated per PA and MD with plans of care.

## 2011-08-19 NOTE — Progress Notes (Signed)
eLink Physician-Brief Progress Note Patient Name: [name] DOB: 1933/02/15 MRN: [id number]  Date of Service  08/19/2011   HPI/Events of Note     eICU Interventions  Placed SCDs   Intervention Category Intermediate Interventions: Best-practice therapies (e.g. DVT, beta blocker, etc.)  Kasin Tonkinson S. 08/19/2011, 3:06 PM

## 2011-08-19 NOTE — Progress Notes (Addendum)
[facility] PROGRESS NOTE    SUBJECTIVE   Patient was actively in respiratory distress upon entering room. While examining patient she showed bilateral upper and lower extremities concerning for possible seizure activity.  PCCM at bedside.  Patient has received 2 mg ativan and bilateral jerking has decreased.    OBJECTIVE   Vital signs in last 24 hours: Temp:  [98.2 F (36.8 C)-98.8 F (37.1 C)] 98.2 F (36.8 C) 09/17/22 1118) Pulse Rate:  [85-95] 95  09/17/22 0600) Resp:  [16-22] 16  09/17/22 0600) BP: (88-148)/(43-123) 148/123 mmHg 09-17-2022 1142) SpO2:  [93 %-100 %] 98 % September 17, 2022 1142) Weight:  [62.3 kg (137 lb 5.6 oz)-62.9 kg (138 lb 10.7 oz)] 62.3 kg (137 lb 5.6 oz) (06/23 2000)  Intake/Output from previous day: 06/23 0701 - 09-17-22 0700 In: 180 [P.O.:180] Out: 10 [Urine:200] Intake/Output this shift:   Nutritional status: NPO  Past Medical History  Diagnosis Date  . Hypertension   . Anxiety and depression   . Chronic headaches   . Diverticular disease   . Anemia   . Arthritis     osteoarthritis  . Osteoporosis   . Vitamin B 12 deficiency   . History of ITP   . Anxiety     takes xanax for sleep  . Diabetes mellitus     NIDDM x 12 years  . Heart murmur     benign;asymptomatic  . Chronic kidney disease     esrd    Neurologic Exam:  Mental Status: Obtunded, eyes roving, no response to verbal or tactile stimuli.  Cranial Nerves: II-no blink to threat III/IV/VI-Extraocular movements intact, roving and positive doll's.  Pupils reactive bilaterally. V/VII-face symmetric VIII-movements symmetrical IX/X-normal gag XII- tongue positioned midline Motor: bilateral extremities fine TC activity.  Increased tone throughout Sensory: no response Deep Tendon Reflexes: 1+ and symmetric throughout Plantars downgoing bilaterally    Lab Results: Lab Results  Component Value Date/Time   CHOL  Value: 182        ATP III  CLASSIFICATION:  <200     mg/dL   Desirable  161-096  mg/dL   Borderline High  >=045    mg/dL   High        40/10/8117  4:20 AM   Lipid Panel No results found for this basename: CHOL,TRIG,HDL,CHOLHDL,VLDL,LDLCALC in the last 72 hours  Studies/Results: Dg Abd 2 Views  September 17, 2011  *RADIOLOGY REPORT*  Clinical Data: Abdominal pain.  Abdominal distension.  ABDOMEN - 2 VIEW  Comparison: Multiple priors, most recently 08/11/2011.  Findings: There is some gas and stool scattered throughout the colon.  There are multiple dilated gas-filled loops of small bowel measuring up to approximately 5.6 cm in diameter.  Multiple air fluid levels are noted on the left lateral decubitus view.  No definite pneumoperitoneum is identified. The stomach appears to be massively dilated and filled with gas.  There is a relative paucity of bowel gas in the central abdomen on the supine view.  Surgical clips project over the right upper quadrant of the abdomen, and there are two residual skin staples projecting over the mid abdomen and lower anatomic pelvis.  Other previously noted skin staples have been removed.  Postoperative changes of right total hip arthroplasty are incompletely visualized.  IMPRESSION: 1.  Nonspecific  bowel gas pattern, as above, favored to reflect persistent postoperative ileus.  Partial small bowel obstruction is difficult to entirely excluded, however, and could have a similar appearance. 2.  Increasing gastric distension.  No nasogastric tube is identified. 3.  Postoperative changes, as above.  Original Report Authenticated By: Florencia Reasons, M.D.    Medications:     Scheduled:   . albuterol  2.5 mg Nebulization Q6H   And  . ipratropium  0.5 mg Nebulization Q6H  . bisacodyl  10 mg Rectal Daily  . calcium gluconate  1 g Intravenous Once  . dextrose      . dextrose  1 ampule Intravenous Once  . dextrose      . dextrose      . fentaNYL      . fentaNYL      . insulin aspart  10 Units  Subcutaneous Once  . LORazepam      . midazolam      . midazolam      . pantoprazole (PROTONIX) IV  40 mg Intravenous QHS  . phenytoin (DILANTIN) IV  100 mg Intravenous Q8H  . sodium polystyrene  30 g Rectal Once  . valproate sodium  1,000 mg Intravenous Once  . DISCONTD: antiseptic oral rinse  15 mL Mouth Rinse q12n4p  . DISCONTD: atenolol  25 mg Oral BID  . DISCONTD: chlorhexidine  15 mL Mouth Rinse BID  . DISCONTD: cloNIDine  0.3 mg Transdermal Weekly  . DISCONTD: darbepoetin (ARANESP) injection - NON-DIALYSIS  60 mcg Subcutaneous Q Sat-1800  . DISCONTD: furosemide  40 mg Intravenous Once  . DISCONTD: hydrALAZINE  10 mg Intravenous Q6H  . DISCONTD: hydrALAZINE  10 mg Oral Q6H  . DISCONTD: insulin aspart  0-9 Units Subcutaneous Q4H  . DISCONTD: lip balm  1 application Topical BID  . DISCONTD: LORazepam  1 mg Intravenous Once  . DISCONTD: pantoprazole  40 mg Oral Q1200  . DISCONTD: phenytoin (DILANTIN) IV  1,000 mg Intravenous Once  . DISCONTD: phenytoin  100 mg Oral TID  . DISCONTD: predniSONE  20 mg Oral QAC breakfast  . DISCONTD: sodium polystyrene  30 g Oral Once  . DISCONTD: sodium polystyrene  45 g Oral Once  . DISCONTD: sodium polystyrene  60 g Oral Once    Assessment/Plan:    Patient Active Hospital Problem List:  Seizure, convulsive (08/12/2011) Assessment: patient showing active TC seizure,  moved to 2113 and intubated by PCCM.  Patient given a total of 4 mg ativan and 1 gram Depacon infusing now.   -EEG has been called and will be obtaining EEG  Plan:  1. Continue Dilantin. Pharmacy monitoring dosing. Corrected level now 15.6 2. Patient is currently sedated and not showing clinical seizure activity.  Will obtain EEG to further evaluate nonconvulsive seizure activity. 3. Dilantin is not suppressing seizure activity thus will load with 1 Gram Depacon and continue maintenance dose of  Depacon 500 mg BID IV with level in AM 4. LFT, Mg+, phosphorus, to complete seizure  workup.    PRES  Assessment: stable   Plan: Agree with continued BP control   Felicie Morn PA-C [facility] [phone]  08/19/2011, 11:58 AM

## 2011-08-19 NOTE — Progress Notes (Signed)
OT Cancellation Note  Treatment cancelled today due to medical issues with patient which prohibited therapy.  [name] 08/19/2011, 11:40 AM

## 2011-08-19 NOTE — Progress Notes (Signed)
ANTIBIOTIC CONSULT NOTE - INITIAL  Pharmacy Consult for Vancomycin and Zosyn Indication: rule out sepsis  No Known Allergies  Patient Measurements: Height: 5\' 3"  (160 cm) Weight: 137 lb 5.6 oz (62.3 kg) IBW/kg (Calculated) : 52.4   Vital Signs: Temp: 98.2 F (36.8 C) (06/24 1118) Temp src: Axillary (06/24 1118) BP: 148/123 mmHg (06/24 1142) Pulse Rate: 95  (06/24 0600) Intake/Output from previous day: 06/23 0701 - 06/24 0700 In: 180 [P.O.:180] Out: 10 [Urine:200] Intake/Output from this shift:    Labs:  Basename 08/19/11 0930 08/19/11 0437 08/18/11 1429 08/17/11 1010  WBC -- 11.3* -- 13.0*  HGB -- 9.4* -- 10.4*  PLT -- 100* -- 117*  LABCREA -- -- -- --  CREATININE 2.65* 2.26* 3.02* --   Estimated Creatinine Clearance: 14.7 ml/min (by C-G formula based on Cr of 2.65). No results found for this basename: VANCOTROUGH:2,VANCOPEAK:2,VANCORANDOM:2,GENTTROUGH:2,GENTPEAK:2,GENTRANDOM:2,TOBRATROUGH:2,TOBRAPEAK:2,TOBRARND:2,AMIKACINPEAK:2,AMIKACINTROU:2,AMIKACIN:2, in the last 72 hours   Microbiology: Recent Results (from the past 720 hour(s))  URINE CULTURE     Status: Normal   Collection Time   08/04/11  2:53 PM      Component Value Range Status Comment   Specimen Description URINE, CLEAN CATCH   Final    Special Requests NONE   Final    Culture  Setup Time [id number]   Final    Colony Count >=100,000 COLONIES/ML   Final    Culture ESCHERICHIA COLI   Final    Report Status 08/06/2011 FINAL   Final    Organism ID, Bacteria ESCHERICHIA COLI   Final   SURGICAL PCR SCREEN     Status: Normal   Collection Time   08/06/11  1:28 PM      Component Value Range Status Comment   MRSA, PCR NEGATIVE  NEGATIVE Final    Staphylococcus aureus NEGATIVE  NEGATIVE Final   URINE CULTURE     Status: Normal   Collection Time   08/14/11  5:39 PM      Component Value Range Status Comment   Specimen Description URINE, CATHETERIZED   Final    Special Requests NONE   Final    Culture  Setup  Time [id number]   Final    Colony Count NO GROWTH   Final    Culture NO GROWTH   Final    Report Status 08/15/2011 FINAL   Final     Medical History: Past Medical History  Diagnosis Date  . Hypertension   . Anxiety and depression   . Chronic headaches   . Diverticular disease   . Anemia   . Arthritis     osteoarthritis  . Osteoporosis   . Vitamin B 12 deficiency   . History of ITP   . Anxiety     takes xanax for sleep  . Diabetes mellitus     NIDDM x 12 years  . Heart murmur     benign;asymptomatic  . Chronic kidney disease     esrd    Medications:  Prescriptions prior to admission  Medication Sig Dispense Refill  . acetaminophen (TYLENOL) 500 MG tablet Take 500 mg by mouth 2 (two) times daily as needed. For pain.      Marland Kitchen alprazolam (XANAX) 2 MG tablet Take 1-2 mg by mouth at bedtime as needed. For sleep.       Marland Kitchen atenolol (TENORMIN) 25 MG tablet Take 25 mg by mouth 2 (two) times daily.       . cloNIDine (CATAPRES) 0.1 MG tablet Take 0.1 mg by mouth 2 (  two) times daily.        . cyanocobalamin (,VITAMIN B-12,) 1000 MCG/ML injection Inject 1,000 mcg into the muscle every 30 (thirty) days.       . darbepoetin (ARANESP) 150 MCG/0.3ML SOLN Inject 150 mcg into the skin every 30 (thirty) days. As needed. Only receives if blood test is low      . furosemide (LASIX) 20 MG tablet Take 20 mg by mouth daily.       Marland Kitchen oxybutynin (DITROPAN) 5 MG tablet Take 5 mg by mouth daily.       . pioglitazone (ACTOS) 30 MG tablet Take 30 mg by mouth daily.       . predniSONE (DELTASONE) 5 MG tablet TAKE 2 TABLETS BY MOUTH EVERY MORNING  100 tablet  1  . simvastatin (ZOCOR) 40 MG tablet Take 40 mg by mouth at bedtime.         Assessment: 78 y.o. female admitted 6/11 for ventral hernia repair. Hospital course complicated by seizures, ileus, ARF on CKD stage IV. Pt known to pharmacy from dilantin dosing. Pt with new sz activity and respiratory distress which required intubation today and transfer to  ICU. To begin broad spectrum antibiotics for r/o sepsis. UOP only 200cc past 24 hours. No HD initiated yet.  Goal of Therapy:  Vancomycin trough level 15-20 mcg/ml  Plan:  1. Zosyn 2.25gm IV q8h 2. Vancomycin 1gm IV q48h 3. Will f/u microbiological data, renal function, HD plans, and pt's clinical condition.  Christoper Fabian, PharmD, BCPS Clinical pharmacist, pager [phone] 08/19/2011,12:43 PM

## 2011-08-19 NOTE — Progress Notes (Signed)
MEDICATION RELATED CONSULT NOTE - FOLLOW UP   Pharmacy Consult:  Dilantin Indication: Seizure-like activity  No Known Allergies  Patient Measurements: Height: 5\' 3"  (160 cm) Weight: 137 lb 5.6 oz (62.3 kg) IBW/kg (Calculated) : 52.4   Vital Signs: Temp: 98.8 F (37.1 C) (06/24 0600) BP: 116/84 mmHg (06/24 0600) Pulse Rate: 95  (06/24 0600) Intake/Output from previous day: 06/23 0701 - 06/24 0700 In: 180 [P.O.:180] Out: 10 [Urine:200]  Labs:  Basename 08/19/11 0437 08/18/11 1429 08/18/11 0515 08/17/11 1010  WBC 11.3* -- -- 13.0*  HGB 9.4* -- -- 10.4*  HCT 31.6* -- -- 34.2*  PLT 100* -- -- 117*  APTT -- -- -- --  CREATININE 2.26* 3.02* 2.84* --  LABCREA -- -- -- --  CREATININE 2.26* 3.02* 2.84* --  CREAT24HRUR -- -- -- --  MG -- -- -- --  PHOS -- -- -- --  ALBUMIN -- -- 2.7* --  PROT -- -- 5.4* --  ALBUMIN -- -- 2.7* --  AST -- -- 13 --  ALT -- -- 20 --  ALKPHOS -- -- 66 --  BILITOT -- -- 0.5 --  BILIDIR -- -- -- --  IBILI -- -- -- --   Estimated Creatinine Clearance: 17.2 ml/min (by C-G formula based on Cr of 2.26).       Assessment: 71 YOF with EEG and MRI suggestive of encephalopathy s/p elevated ventral hernia repair on 08/06/11 followed by sudden onset unresponsiveness and convulsions on 08/12/11. Patient on Keppra PTA for similar episode in 2010. Keppra had been off prior to episode and was resumed 6/17 after episode. Neurology then stopped Keppra and changing to Phenytoin.  Corrected phenytoin level therapeutic today.  6/19: DPH 13.6 post-load, corrects up to 20 6/24: DPH 10, albumin 2.7, corrected 15.6   Goal of Therapy:  Phenytoin level 10-20 mcg/mL   Plan:  - Continue Dilantin 100mg  IV Q8H - Monitor CBG trends     Altair Appenzeller D. Laney Potash, PharmD, BCPS Pager:  [phone] 08/19/2011, 9:30 AM

## 2011-08-19 NOTE — Consult Note (Addendum)
Name: [name] MRN: [id number] DOB: 30-Jul-1932    LOS: 13 Date of admit 08/06/2011  9:44 AM   Referring Provider:  Dr Margaree Mackintosh Reason for Referral:  SEizures, resp distress  PULMONARY / CRITICAL CARE MEDICINE  HPI:    78y/o female with DM 2, CKD, AOCD, HTN and ITP on low dose prednisone. She has had 1 seizure in the past with a negative EEG. She was admitted by surgery on 6/11 and underwent a ventral hernia repair. Medicine was consulted due to a witnessed tonic clonic seizure on 6/17. We subsequentially consulted Neuro who has started Keppra for her.  Patient was convalescing in ward 5100 with major event being acute dialysis for hyperkalemia and worsening creatinine on ? 6/21 or 6/22 but on 08/19/11  surgery PA witnessed to be her usual state but 10 min later neuro PA saw her to be agonal and obtunded. Initial concern was respiratory but minutes later patient noted to have tonic clonic seizures of LUE that soon involved all body; 2mg  ativan given. PCCM called for ICU transfer and take over as primary on 08/19/11  Dtr confirmed full code and consented for cvl and intubation 6/24 at time of transfer    Past Medical History  Diagnosis Date  . Hypertension   . Anxiety and depression   . Chronic headaches   . Diverticular disease   . Anemia   . Arthritis     osteoarthritis  . Osteoporosis   . Vitamin B 12 deficiency   . History of ITP   . Anxiety     takes xanax for sleep  . Diabetes mellitus     NIDDM x 12 years  . Heart murmur     benign;asymptomatic  . Chronic kidney disease     esrd   Past Surgical History  Procedure Date  . Cholecystectomy   . Av fistula placement 11/15/10    left upper arm AVF  . Abdominal hysterectomy 1973  . Joint replacement 2007    rt hip  . Eye surgery   . Cataract extraction w/ intraocular lens  implant, bilateral 2002  . Vascular surgery 10-2010    left AVF  . Hernia repair 2000    ventral  . Ventral hernia repair 08/06/2011    Procedure:  HERNIA REPAIR VENTRAL ADULT;  Surgeon: Liz Malady, MD;  Location: [facility] OR;  Service: General;  Laterality: N/A;  Open Repair [employer] Hernia with Mesh   Prior to Admission medications   Medication Sig Start Date End Date Taking? Authorizing Provider  acetaminophen (TYLENOL) 500 MG tablet Take 500 mg by mouth 2 (two) times daily as needed. For pain.   Yes Historical Provider, MD  alprazolam Prudy Feeler) 2 MG tablet Take 1-2 mg by mouth at bedtime as needed. For sleep.    Yes Historical Provider, MD  atenolol (TENORMIN) 25 MG tablet Take 25 mg by mouth 2 (two) times daily.    Yes Historical Provider, MD  cloNIDine (CATAPRES) 0.1 MG tablet Take 0.1 mg by mouth 2 (two) times daily.     Yes Historical Provider, MD  cyanocobalamin (,VITAMIN B-12,) 1000 MCG/ML injection Inject 1,000 mcg into the muscle every 30 (thirty) days.    Yes Historical Provider, MD  darbepoetin (ARANESP) 150 MCG/0.3ML SOLN Inject 150 mcg into the skin every 30 (thirty) days. As needed. Only receives if blood test is low   Yes Historical Provider, MD  furosemide (LASIX) 20 MG tablet Take 20 mg by mouth daily.  Yes Historical Provider, MD  oxybutynin (DITROPAN) 5 MG tablet Take 5 mg by mouth daily.    Yes Historical Provider, MD  pioglitazone (ACTOS) 30 MG tablet Take 30 mg by mouth daily.    Yes Historical Provider, MD  predniSONE (DELTASONE) 5 MG tablet TAKE 2 TABLETS BY MOUTH EVERY MORNING 03/25/11  Yes Exie Parody, MD  simvastatin (ZOCOR) 40 MG tablet Take 40 mg by mouth at bedtime.     Yes Historical Provider, MD   Allergies No Known Allergies  Family History Family History  Problem Relation Age of Onset  . Stroke Mother   . Heart disease Father    Social History  reports that she has never smoked. Her smokeless tobacco use includes Snuff. She reports that she does not drink alcohol or use illicit drugs.  Review Of Systems:  As in HPI  Brief patient description:  See HPI  Events Since Admission: See  HPI  Current Status: Critical, moved to ICU   Vital Signs: Temp:  [98.2 F (36.8 C)-98.8 F (37.1 C)] 98.2 F (36.8 C) (06/24 1118) Pulse Rate:  [85-95] 95  (06/24 0600) Resp:  [16-22] 16  (06/24 0600) BP: (88-148)/(43-123) 148/123 mmHg (06/24 1142) SpO2:  [93 %-100 %] 98 % (06/24 1142) Weight:  [62.3 kg (137 lb 5.6 oz)-62.9 kg (138 lb 10.7 oz)] 62.3 kg (137 lb 5.6 oz) (06/23 2000)  Physical Examination: General:  Cachectic female Neuro:  Seizures v mycolonic jerks. Interimittently somewhat arousable. Gags +. Does withdraw to pain HEENT:  FAce mask on. Gag +. PEERL + Neck:  Suppple No nodes Cardiovascular:  Mild tachy. Normal bp Lungs:  Coarse, mild resp distress Abdomen:  Binder on, distended + Musculoskeletal:  cachectic Skin:  Intact, chronic bruises RUE  Principal Problem:  *Acute respiratory failure Active Problems:  Seizure, convulsive  Hyperkalemia  Encephalopathy acute  Acute on chronic renal failure  Septic shock  Thrombocytopenia  Anemia of renal disease  Recurrent incisional hernia with incarceration  Hypotension  Status post laparotomy  Acute on chronic renal insufficiency   ASSESSMENT AND PLAN  PULMONARY  Lab 08/19/11 1135 08/12/11 1658  PHART 7.460* 7.413*  PCO2ART 45.4* 38.8  PO2ART 245.0* 123.0*  HCO3 31.8* 24.3*  O2SAT 100.0 99.6   Dg Abd 2 Views  08/19/2011  *RADIOLOGY REPORT*  Clinical Data: Abdominal pain.  Abdominal distension.  ABDOMEN - 2 VIEW  Comparison: Multiple priors, most recently 08/11/2011.  Findings: There is some gas and stool scattered throughout the colon.  There are multiple dilated gas-filled loops of small bowel measuring up to approximately 5.6 cm in diameter.  Multiple air fluid levels are noted on the left lateral decubitus view.  No definite pneumoperitoneum is identified. The stomach appears to be massively dilated and filled with gas.  There is a relative paucity of bowel gas in the central abdomen on the supine view.   Surgical clips project over the right upper quadrant of the abdomen, and there are two residual skin staples projecting over the mid abdomen and lower anatomic pelvis.  Other previously noted skin staples have been removed.  Postoperative changes of right total hip arthroplasty are incompletely visualized.  IMPRESSION: 1.  Nonspecific bowel gas pattern, as above, favored to reflect persistent postoperative ileus.  Partial small bowel obstruction is difficult to entirely excluded, however, and could have a similar appearance. 2.  Increasing gastric distension.  No nasogastric tube is identified. 3.  Postoperative changes, as above.  Original Report Authenticated By: Haze Boyden.  Llana Aliment, M.D.   CXR - small lung volumes  Ventilator Settings:   CXR:  Small lung volumes +  ETT:  08/19/2011  (planned) >>   A:  Acute resp failure due to seizure P:   Intubate by Dr Sung Amabile  CARDIOVASCULAR  Lab 08/13/11 1412 08/13/11 0410 08/13/11 0208 08/12/11 2119 08/12/11 1848  TROPONINI 0.46* 0.64* 0.64* 0.55* 0.67*  LATICACIDVEN -- -- -- -- --  PROBNP -- -- -- -- --   ECG:  LVH on 6/23 with NSR Lines: CVL planned 6/4>> ECHO 6/18  - EF 55%,. PA 60s,   A: Currently hypotensive P:  Fluid bolus PRessors if needed  RENAL  Lab 08/19/11 0930 08/19/11 0437 08/18/11 1429 08/18/11 0515 08/17/11 0705 08/15/11 0335 08/13/11 0410 08/13/11 0401  NA 142 141 142 142 144 -- -- --  K 5.5* 4.9 -- -- -- -- -- --  CL 101 101 100 101 103 -- -- --  CO2 32 34* 34* 35* 35* -- -- --  BUN 36* 34* 59* 58* 57* -- -- --  CREATININE 2.65* 2.26* 3.02* 2.84* 2.51* -- -- --  CALCIUM 8.6 8.8 9.1 9.2 8.9 -- -- --  MG -- -- -- -- -- -- -- 2.4  PHOS -- -- -- -- -- 3.2 3.7 --   Intake/Output      06/23 0701 - 06/24 0700 06/24 0701 - 06/25 0700   P.O. 180    Total Intake(mL/kg) 180 (2.9)    Urine (mL/kg/hr) 200 (0.1)    Other -190    Total Output 10    Net +170         Stool Occurrence 2 x     Foley:  ? date  A:   Woprsening acute on chronic renal failure. Sp acute HD ? 6/21 P:   Maintain hemodynamics Renal following  GASTROINTESTINAL  Lab 08/18/11 0515 08/15/11 0335 08/13/11 0410 08/12/11 2119  AST 13 -- -- 17  ALT 20 -- -- 12  ALKPHOS 66 -- -- 37*  BILITOT 0.5 -- -- 0.9  PROT 5.4* -- -- 5.2*  ALBUMIN 2.7* 2.8* 2.7* 2.9*   AXR - iles  A:  ILEUS P:   NG tube  CCS following  HEMATOLOGIC  Lab 08/19/11 0437 08/17/11 1010 08/12/11 1646  HGB 9.4* 10.4* 9.7*  HCT 31.6* 34.2* 31.1*  PLT 100* 117* 132*  INR -- 0.90 --  APTT -- -- --   A:  Anemia of criticall illness P:  - PRBC for hgb </= 6.9gm%    - exceptions are   -  if ACS susepcted/confirmed then transfuse for hgb </= 8.0gm%,  or    -  If septic shock first 24h and scvo2 < 70% then transfuse for hgb </= 9.0gm%   - active bleeding with hemodynamic instability, then transfuse regardless of hemoglobin value   At at all times try to transfuse 1 unit prbc as possible with exception of active hemorrhage    INFECTIOUS  Lab 08/19/11 0437 08/17/11 1010 08/12/11 1646  WBC 11.3* 13.0* 9.8  PROCALCITON -- -- --   Cultures: Results for orders placed during the hospital encounter of 08/06/11  SURGICAL PCR SCREEN     Status: Normal   Collection Time   08/06/11  1:28 PM      Component Value Range Status Comment   MRSA, PCR NEGATIVE  NEGATIVE Final    Staphylococcus aureus NEGATIVE  NEGATIVE Final   URINE CULTURE     Status: Normal  Collection Time   08/14/11  5:39 PM      Component Value Range Status Comment   Specimen Description URINE, CATHETERIZED   Final    Special Requests NONE   Final    Culture  Setup Time [id number]   Final    Colony Count NO GROWTH   Final    Culture NO GROWTH   Final    Report Status 08/15/2011 FINAL   Final     Antibiotics: Anti-infectives     Start     Dose/Rate Route Frequency Ordered Stop   08/14/11 1800   cefTRIAXone (ROCEPHIN) 1 g in dextrose 5 % 50 mL IVPB  Status:  Discontinued        1  g 100 mL/hr over 30 Minutes Intravenous Every 24 hours 08/14/11 1706 08/16/11 1354   08/06/11 1400   ceFAZolin (ANCEF) IVPB 1 g/50 mL premix  Status:  Discontinued        1 g 100 mL/hr over 30 Minutes Intravenous 60 min pre-op 08/06/11 1313 08/06/11 1800           A:  >? septic P:   Pan culture Broad antibiotics  ENDOCRINE  Lab 08/19/11 0753 08/19/11 0357 08/19/11 0004 08/18/11 2133 08/18/11 2104  GLUCAP 102* 91 >600* 116* 54*   A:  Fluctuating sugars   P:   ICU hyperglycemia protocol  NEUROLOGIC  A:  Recurrent seizure on 6/24 v myoclonus P:   Neuro following  BEST PRACTICE / DISPOSITION Level of Care:  ICU Primary Service:  ccs -> pccm on 6/24 Consultants:  Ccs, neuro Code Status:  full Diet:  Npo  DVT Px:  scd GI Px:  ppi Skin Integrity:  intact Social / Family:  dtr dpoa at bedside updated  The patient is critically ill with multiple organ systems failure and requires high complexity decision making for assessment and support, frequent evaluation and titration of therapies, application of advanced monitoring technologies and extensive interpretation of multiple databases.   Critical Care Time devoted to patient care services described in this note is  60  Minutes.  Dr. Kalman Shan, M.D., [facility].C.P Pulmonary and Critical Care Medicine Staff Physician [facility] Pager: [phone], If no answer or between  15:00h - 7:00h: call [phone]  08/19/2011 12:18 PM

## 2011-08-19 NOTE — Evaluation (Signed)
Clinical/Bedside Swallow Evaluation Patient Details  Name: [name] MRN: [id number] Date of Birth: 1932/03/15  Today's Date: 08/19/2011 Time: 4098-1191 SLP Time Calculation (min): 25 min  Past Medical History:  Past Medical History  Diagnosis Date  . Hypertension   . Anxiety and depression   . Chronic headaches   . Diverticular disease   . Anemia   . Arthritis     osteoarthritis  . Osteoporosis   . Vitamin B 12 deficiency   . History of ITP   . Anxiety     takes xanax for sleep  . Diabetes mellitus     NIDDM x 12 years  . Heart murmur     benign;asymptomatic  . Chronic kidney disease     esrd   Past Surgical History:  Past Surgical History  Procedure Date  . Cholecystectomy   . Av fistula placement 11/15/10    left upper arm AVF  . Abdominal hysterectomy 1973  . Joint replacement 2007    rt hip  . Eye surgery   . Cataract extraction w/ intraocular lens  implant, bilateral 2002  . Vascular surgery 10-2010    left AVF  . Hernia repair 2000    ventral  . Ventral hernia repair 08/06/2011    Procedure: HERNIA REPAIR VENTRAL ADULT;  Surgeon: Liz Malady, MD;  Location: [facility] OR;  Service: General;  Laterality: N/A;  Open Repair REcurrent Inc. Hernia with Mesh   HPI:  79y/o female with DM 2, CKD, AOCD, HTN and ITP on low dose prednisone. She has had 1 seizure in  the past with a negative EEG.  She was admitted by surgery on 6/11 and underwent a ventral hernia repair.  Medicine was consulted due to a witnessed tonic clonic seizure on 6/17. Neuro has started Keppra for her. Pt diagnosed with PRES. Has had moments of alertness in past two days. Had HD last night. Daughter reports she has been feeding her mom when she is sleeping and she swallows fine.    Assessment / Plan / Recommendation Clinical Impression  Pt is not able to consume POs because she is unresponsive. It is unclear if she exhibits any dysphagia at baseline. Daughter denies difficulty but she also reports  that her mother swallows just fine when she feeds her while she is sleeping. SLP educated daughter regarding the risks of aspiration with a somnolent pt. SLP will continue to follow as alertness improves. Keep NPO for now.     Aspiration Risk  Severe    Diet Recommendation NPO;Alternative means - temporary        Other  Recommendations Oral Care Recommendations: Oral care QID   Follow Up Recommendations       Frequency and Duration min 2x/week  2 weeks   Pertinent Vitals/Pain NA    SLP Swallow Goals Goal #3: Pt will sustain attention to PO intake for 30 seconds to participate in PO trials to assess swallow function.    Swallow Study Prior Functional Status       General HPI: 79y/o female with DM 2, CKD, AOCD, HTN and ITP on low dose prednisone. She has had 1 seizure in  the past with a negative EEG.  She was admitted by surgery on 6/11 and underwent a ventral hernia repair.  Medicine was consulted due to a witnessed tonic clonic seizure on 6/17. Neuro has started Keppra for her. Pt diagnosed with PRES. Has had moments of alertness in past two days. Had HD last night. Daughter  reports she has been feeding her mom when she is sleeping and she swallows fine.  Type of Study: Bedside swallow evaluation Diet Prior to this Study: NPO Temperature Spikes Noted: No Respiratory Status: Room air Behavior/Cognition: Other (comment) (unresponsive) Oral Cavity - Dentition: Dentures, not available Self-Feeding Abilities: Total assist Patient Positioning: Upright in bed Baseline Vocal Quality: Other (comment) (no voice) Volitional Cough: Cognitively unable to elicit Volitional Swallow: Unable to elicit    Oral/Motor/Sensory Function Overall Oral Motor/Sensory Function: Other (comment) (unable to participate)   Ice Chips Ice chips: Impaired Presentation: Spoon Oral Phase Impairments: Poor awareness of bolus Pharyngeal Phase Impairments: Unable to trigger swallow   Thin Liquid      Nectar  Thick     Honey Thick     Puree     Solid     Harlon Ditty, MA CCC-SLP [phone]  Claudine Mouton 08/19/2011,11:26 AM

## 2011-08-19 NOTE — Progress Notes (Signed)
Patient ID: [name], female   DOB: Sep 03, 1932, 78 y.o.   MRN: [id number] 13 Days Post-Op  Subjective: Somulent this morning, but does respond to voice. Does not appear to be in any acute distress. Daughter at bedside.  Objective: Vital signs in last 24 hours: Temp:  [98.2 F (36.8 C)-98.8 F (37.1 C)] 98.8 F (37.1 C) (06/24 0600) Pulse Rate:  [85-95] 95  (06/24 0600) Resp:  [16-22] 16  (06/24 0600) BP: (88-143)/(43-84) 116/84 mmHg (06/24 0600) SpO2:  [93 %-100 %] 97 % (06/24 0600) Weight:  [137 lb 5.6 oz (62.3 kg)-138 lb 10.7 oz (62.9 kg)] 137 lb 5.6 oz (62.3 kg) (06/23 2000) Last BM Date: 08/17/11  Intake/Output from previous day: 06/23 0701 - 06/24 0700 In: 180 [P.O.:180] Out: 10 [Urine:200] Intake/Output this shift:    Incision/Wound:SOFT CLEAN DRY INTACT.  Abdomen is firm, hypoactive BS, + BM. Left arm appears swollen? From dialysis, + pulses.  Lab Results:   Basename 08/19/11 0437 08/17/11 1010  WBC 11.3* 13.0*  HGB 9.4* 10.4*  HCT 31.6* 34.2*  PLT 100* 117*   BMET  Basename 08/19/11 0437 08/19/11 0108 08/18/11 1429  NA 141 -- 142  K 4.9 -- 6.4*  CL 101 -- 100  CO2 34* -- 34*  GLUCOSE 89 93 --  BUN 34* -- 59*  CREATININE 2.26* -- 3.02*  CALCIUM 8.8 -- 9.1   PT/INR  Basename 08/17/11 1010  LABPROT 12.3  INR 0.90   ABG No results found for this basename: PHART:2,PCO2:2,PO2:2,HCO3:2 in the last 72 hours  Studies/Results: No results found.  Anti-infectives: Anti-infectives     Start     Dose/Rate Route Frequency Ordered Stop   08/14/11 1800   cefTRIAXone (ROCEPHIN) 1 g in dextrose 5 % 50 mL IVPB  Status:  Discontinued        1 g 100 mL/hr over 30 Minutes Intravenous Every 24 hours 08/14/11 1706 08/16/11 1354   08/06/11 1400   ceFAZolin (ANCEF) IVPB 1 g/50 mL premix  Status:  Discontinued        1 g 100 mL/hr over 30 Minutes Intravenous 60 min pre-op 08/06/11 1313 08/06/11 1800          Assessment/Plan: s/p Procedure(s) (LRB): HERNIA  REPAIR VENTRAL ADULT (N/A) Deconditioned continue nutritional supplementation.  Needs swallowing evaluation (scheduled for today) will make NPO until this is completed secondary to increased somnolence this am. Will recheck K today.  Seizure disorder  Followed by neurology.  Appears stable. Leave off chemical DVT  Prevention since concerned about bleeding risk.  SCD. Will order 2 view abdominal films to assess belly.   Addenum:  Called to patients room @ approx 1120 am with c/o "not looking so good" voiced by nursing staff member. Patient appeared to be obtunded and had minimal airway movement and was hypotensive. Patient was placed on NRB mask, given 500cc NS bolus, sat's were 100% with this and repositioning in order to open her airway. Patient also exhibited what appeared to be seizure like activity, bilaterally with generalized twitching movements, and wandering gaze. After consultation with rapid response team and CCM, patient was transferred to the ICU for airway management and further workup.       LOS: 13 days    Gwenith Tschida 08/19/2011

## 2011-08-19 NOTE — Progress Notes (Signed)
Have noted events of today.  Patient apparently had seizure activity and is now intubated - on CCM service.  Abdomen seems to be benign.  Wound healing well.  Large liquid bowel movements - ?heme positive - FOBT pending.  Would hold chemical DVT prophylaxis for now.  Will follow her abdomen.  Wilmon Arms. Corliss Skains, MD, [facility] Surgery  08/19/2011 3:21 PM

## 2011-08-19 NOTE — Procedures (Signed)
PROCEDURE NOTE: [facility] [place] CVL PLACEMENT  INDICATION:    Monitoring of central venous pressures and/or administration of medications optimally administered in central vein  CONSENT:   Risks of procedure as well as the alternatives were explained to the patient or surrogate. Verbal consent for procedure obtained by Dr Marchelle Gearing. A time out was performed to review patient identification, procedure to be performed, correct patient position, medications/allergies/relevent history, required imaging and test results.  PROCEDURE  Maximum sterile technique was used including antiseptics, cap, gloves, gown, hand hygiene, mask and sheet.  Skin prep: Chlorhexidine; local anesthetic administered  A antimicrobial bonded/coated triple lumen catheter was placed in the L Hydro vein using the Seldinger technique.    EVALUATION:  Blood flow good  Complications: No apparent complications  Patient tolerated the procedure well.  Chest X-ray ordered to verify placement and is pending   Billy Fischer, MD PCCM service Mobile [phone]

## 2011-08-20 DIAGNOSIS — Z9889 Other specified postprocedural states: Secondary | ICD-10-CM

## 2011-08-20 DIAGNOSIS — R569 Unspecified convulsions: Secondary | ICD-10-CM

## 2011-08-20 DIAGNOSIS — K7290 Hepatic failure, unspecified without coma: Secondary | ICD-10-CM

## 2011-08-20 DIAGNOSIS — K567 Ileus, unspecified: Secondary | ICD-10-CM | POA: Diagnosis not present

## 2011-08-20 DIAGNOSIS — D696 Thrombocytopenia, unspecified: Secondary | ICD-10-CM

## 2011-08-20 DIAGNOSIS — G40401 Other generalized epilepsy and epileptic syndromes, not intractable, with status epilepticus: Secondary | ICD-10-CM

## 2011-08-20 LAB — COMPREHENSIVE METABOLIC PANEL
ALT: 81 U/L — ABNORMAL HIGH (ref 0–35)
BUN: 38 mg/dL — ABNORMAL HIGH (ref 6–23)
Calcium: 7.6 mg/dL — ABNORMAL LOW (ref 8.4–10.5)
Creatinine, Ser: 2.77 mg/dL — ABNORMAL HIGH (ref 0.50–1.10)
GFR calc Af Amer: 18 mL/min — ABNORMAL LOW (ref >90)
Glucose, Bld: 91 mg/dL (ref 70–99)
Sodium: 143 mEq/L (ref 135–145)
Total Protein: 4.5 g/dL — ABNORMAL LOW (ref 6.0–8.3)

## 2011-08-20 LAB — CBC
HCT: 27.1 % — ABNORMAL LOW (ref 36.0–46.0)
MCH: 30.9 pg (ref 26.0–34.0)
MCV: 100.7 fL — ABNORMAL HIGH (ref 78.0–100.0)
Platelets: 74 10*3/uL — ABNORMAL LOW (ref 150–400)
RBC: 2.69 MIL/uL — ABNORMAL LOW (ref 3.87–5.11)
WBC: 11.4 10*3/uL — ABNORMAL HIGH (ref 4.0–10.5)

## 2011-08-20 LAB — GLUCOSE, CAPILLARY
Glucose-Capillary: 102 mg/dL — ABNORMAL HIGH (ref 70–99)
Glucose-Capillary: 79 mg/dL (ref 70–99)
Glucose-Capillary: 81 mg/dL (ref 70–99)

## 2011-08-20 LAB — URINE CULTURE: Culture  Setup Time: 201306241920

## 2011-08-20 LAB — BLOOD GAS, ARTERIAL
MECHVT: 400 mL
TCO2: 25.1 mmol/L (ref 0–100)
pCO2 arterial: 34.3 mmHg — ABNORMAL LOW (ref 35.0–45.0)
pH, Arterial: 7.46 — ABNORMAL HIGH (ref 7.350–7.400)

## 2011-08-20 LAB — IRON AND TIBC
Iron: 25 ug/dL — ABNORMAL LOW (ref 42–135)
TIBC: 151 ug/dL — ABNORMAL LOW (ref 250–470)

## 2011-08-20 LAB — CARDIAC PANEL(CRET KIN+CKTOT+MB+TROPI)
CK, MB: 14.2 ng/mL (ref 0.3–4.0)
Total CK: 807 U/L — ABNORMAL HIGH (ref 7–177)

## 2011-08-20 LAB — MAGNESIUM: Magnesium: 1.4 mg/dL — ABNORMAL LOW (ref 1.5–2.5)

## 2011-08-20 MED ORDER — NEPRO/CARBSTEADY PO LIQD
1000.0000 mL | ORAL | Status: DC
  Administered 2011-08-20 – 2011-08-25 (×5): 1000 mL
  Filled 2011-08-20 (×7): qty 1000

## 2011-08-20 MED ORDER — NEPRO/CARBSTEADY PO LIQD
237.0000 mL | ORAL | Status: DC
  Filled 2011-08-20 (×4): qty 237

## 2011-08-20 MED ORDER — BIOTENE DRY MOUTH MT LIQD
15.0000 mL | Freq: Four times a day (QID) | OROMUCOSAL | Status: DC
  Administered 2011-08-20 – 2011-08-26 (×25): 15 mL via OROMUCOSAL

## 2011-08-20 MED ORDER — CHLORHEXIDINE GLUCONATE 0.12 % MT SOLN
15.0000 mL | Freq: Two times a day (BID) | OROMUCOSAL | Status: DC
Start: 2011-08-20 — End: 2011-08-26
  Administered 2011-08-20 – 2011-08-26 (×13): 15 mL via OROMUCOSAL
  Filled 2011-08-20 (×10): qty 15

## 2011-08-20 MED ORDER — NEPRO/CARBSTEADY PO LIQD
1000.0000 mL | ORAL | Status: DC
  Filled 2011-08-20: qty 1000

## 2011-08-20 NOTE — Progress Notes (Signed)
OT Cancellation Note  Treatment cancelled today due to medical issues with patient which prohibited therapy. Pt currently intubated and unable to follow commands. Will sign off for now. Please re-order when pt is medically appropriate.  Lawyer Washabaugh A OTR/L [phone] 08/20/2011, 9:39 AM

## 2011-08-20 NOTE — Progress Notes (Signed)
Patient has some thin serosanguinous drainage from between the steri-strips of her midline incision.  No purulent.  No cellulitis.  Large BM yesterday.  Doubt SBO or ileus.  Wilmon Arms. Corliss Skains, MD, [facility] Surgery  08/20/2011 10:08 AM

## 2011-08-20 NOTE — Progress Notes (Signed)
eLink Physician-Brief Progress Note Patient Name: [name] DOB: 06/21/1932 MRN: [id number]  Date of Service  08/20/2011   HPI/Events of Note   diarrhea and incontinence  eICU Interventions  flexiseal placement      Shenica Holzheimer S. 08/20/2011, 10:49 PM

## 2011-08-20 NOTE — Progress Notes (Signed)
Pt profile: 79 yowf with PMH of DM, CKD, Htn, ITP (chronic low dose pred) and one prior seizure with negative EEG. Underwent ventral hernia repair 6/11. Suffered tonic-clonic seizure 6/17. Required urgent HD 6/22 for hyperkalemia. Suffered recurrent tonic clonic seizures 6/24 requiring 4 mg Ativan IV. Intubated for depressed LOC. PCCM assumed care 6/24  Active problems: Principal Problem:  *Acute respiratory failure Active Problems:  Acute on chronic renal insufficiency  Seizure, convulsive  Encephalopathy acute  Status post laparotomy  Thrombocytopenia  Anemia of renal disease  Recurrent incisional hernia with incarceration  Ileus following gastrointestinal surgery  Hyperkalemia  Hypotension   Subj: RASS -2. Opens eyes to voice. Not F/C  Obj: Filed Vitals:   08/20/11 1600  BP: 107/51  Pulse: 100  Temp:   Resp: 19    Gen: chronically ill appearing, NAD HEENT: WNL Neck: JVP not visualized, no LAN Chest: slightly coarse B BS Cardiac: RRR s M Abd: Mildly distended and tympanitic, diminished to absent BS, ecchymosis over LLQ to L flank Ext: trace symmetric LE edema    CXR: low volumes with bilateral basilar atelectasis  BMET    Component Value Date/Time   NA 143 08/20/2011 0452   K 4.8 08/20/2011 0452   CL 105 08/20/2011 0452   CO2 23 08/20/2011 0452   GLUCOSE 91 08/20/2011 0452   BUN 38* 08/20/2011 0452   CREATININE 2.77* 08/20/2011 0452   CALCIUM 7.6* 08/20/2011 0452   GFRNONAA 15* 08/20/2011 0452   GFRAA 18* 08/20/2011 0452    Lab Results  Component Value Date   WBC 11.4* 08/20/2011   HGB 8.3* 08/20/2011   HCT 27.1* 08/20/2011   MCV 100.7* 08/20/2011   PLT 74* 08/20/2011       IMPRESSION/PLAN:   Acute respiratory failure due to decreased LOC post seizure - cont current vent support - Daily WUA/SBT   Hypotension - resolved   Seizure, convulsive  Encephalopathy acute - Anticonvulsants per Neuro - Minimize sedation   Acute on chronic renal  insufficiency  Hyperkalemia - resolved - Monitor daily BMET - Monitor I/Os   Anemia of renal disease  Thrombocytopenia - No indication for RBCs or platelets presently - LMWH stopped 6/24 - SCDs for DVT prophy    Recurrent incisional hernia with incarceration  Ileus following gastrointestinal surgery  Status post laparotomy - Post op mgmt per CCS - initiate trickle TFs   CCM 45 mins  Billy Fischer, MD ; [facility] service Mobile [phone].  After 5:30 PM or weekends, call [phone]

## 2011-08-20 NOTE — Progress Notes (Signed)
[facility] PROGRESS NOTE    SUBJECTIVE   Patient intubated, last versed given at 6 am.  Patient will open eyes to voice, and will show myoclonic jerking when touched.    OBJECTIVE   Vital signs in last 24 hours: Temp:  [95.9 F (35.5 C)-98.3 F (36.8 C)] 97.1 F (36.2 C) (06/25 0735) Pulse Rate:  [70-93] 93  (06/25 0900) Resp:  [15-31] 24  (06/25 0900) BP: (63-148)/(31-123) 122/54 mmHg (06/25 0900) SpO2:  [98 %-100 %] 100 % (06/25 0900) FiO2 (%):  [39.6 %-100 %] 39.6 % (06/25 0900) Weight:  [63.9 kg (140 lb 14 oz)-68.5 kg (151 lb 0.2 oz)] 68.5 kg (151 lb 0.2 oz) (06/25 0500)  Intake/Output from previous day: 06/24 0701 - 06/25 0700 In: 5316 [I.V.:400; IV Piggyback:4916] Out: 280 [Urine:280] Intake/Output this shift: Total I/O In: 60 [I.V.:60] Out: 5 [Urine:5] Nutritional status: NPO  Past Medical History  Diagnosis Date  . Hypertension   . Anxiety and depression   . Chronic headaches   . Diverticular disease   . Anemia   . Arthritis     osteoarthritis  . Osteoporosis   . Vitamin B 12 deficiency   . History of ITP   . Anxiety     takes xanax for sleep  . Diabetes mellitus     NIDDM x 12 years  . Heart murmur     benign;asymptomatic  . Chronic kidney disease     esrd    Neurologic Exam:   Mental Status: Intubated, follows no commands, will open eyes to voice and look toward her daughters voice.  Cranial Nerves: II-blinks to threat bilaterally III/IV/VI-intact to oculocephalic movement and will track her daughters voice  Pupils reactive bilaterally. V/VII-face symmetric and grimmice symmetric VIII-grossly intact  Motor: moved bilateral UE antigravity and localizes to pain with sternal rub.  When touched or manipulated she will show myoclonic jerking activity which will stop when stimuli is stopped.  Right leg has increased tone compared to left and patient withdraws from pain bilateral LE ext.  Sensory:  withdraws to pain in all 4 extremitis Deep Tendon Reflexes: 1+ and symmetric throughout Plantars downgoing bilaterally   Lab Results: Lab Results  Component Value Date/Time   CHOL  Value: 182        ATP III CLASSIFICATION:  <200     mg/dL   Desirable  161-096  mg/dL   Borderline High  >=045    mg/dL   High        40/10/8117  4:20 AM   Results for orders placed during the hospital encounter of 08/06/11 (from the past 24 hour(s))  GLUCOSE, CAPILLARY     Status: Abnormal   Collection Time   08/19/11 11:23 AM      Component Value Range   Glucose-Capillary 111 (*) 70 - 99 mg/dL   Comment 1 Notify RN    BLOOD GAS, ARTERIAL     Status: Abnormal   Collection Time   08/19/11 11:35 AM      Component Value Range   FIO2 1.00     Delivery systems NON-REBREATHER OXYGEN MASK     pH, Arterial 7.460 (*) 7.350 - 7.400   pCO2 arterial 45.4 (*) 35.0 - 45.0 mmHg   pO2, Arterial 245.0 (*) 80.0 -  100.0 mmHg   Bicarbonate 31.8 (*) 20.0 - 24.0 mEq/L   TCO2 33.2  0 - 100 mmol/L   Acid-Base Excess 7.7 (*) 0.0 - 2.0 mmol/L   O2 Saturation 100.0     Patient temperature 98.6     Collection site RIGHT RADIAL     Drawn by [phone]     Sample type ARTERIAL DRAW     Allens test (pass/fail) PASS  PASS  GLUCOSE, CAPILLARY     Status: Abnormal   Collection Time   08/19/11 12:08 PM      Component Value Range   Glucose-Capillary 107 (*) 70 - 99 mg/dL  CARDIAC PANEL(CRET KIN+CKTOT+MB+TROPI)     Status: Abnormal   Collection Time   08/19/11  1:12 PM      Component Value Range   Total CK 174  7 - 177 U/L   CK, MB 4.6 (*) 0.3 - 4.0 ng/mL   Troponin I <0.30  <0.30 ng/mL   Relative Index 2.6 (*) 0.0 - 2.5  PRO B NATRIURETIC PEPTIDE     Status: Abnormal   Collection Time   08/19/11  1:12 PM      Component Value Range   Pro B Natriuretic peptide (BNP) 8892.0 (*) 0 - 450 pg/mL  CULTURE, BLOOD (ROUTINE X 2)     Status: Normal (Preliminary result)   Collection Time   08/19/11  1:15 PM      Component Value Range    Specimen Description BLOOD RIGHT HAND     Special Requests BOTTLES DRAWN AEROBIC ONLY 4CC     Culture  Setup Time [id number]     Culture       Value:        BLOOD CULTURE RECEIVED NO GROWTH TO DATE CULTURE WILL BE HELD FOR 5 DAYS BEFORE ISSUING A FINAL NEGATIVE REPORT   Report Status PENDING    CULTURE, BLOOD (ROUTINE X 2)     Status: Normal (Preliminary result)   Collection Time   08/19/11  1:30 PM      Component Value Range   Specimen Description BLOOD RIGHT HAND     Special Requests BOTTLES DRAWN AEROBIC ONLY 5CC     Culture  Setup Time [id number]     Culture       Value:        BLOOD CULTURE RECEIVED NO GROWTH TO DATE CULTURE WILL BE HELD FOR 5 DAYS BEFORE ISSUING A FINAL NEGATIVE REPORT   Report Status PENDING    LACTIC ACID, PLASMA     Status: Normal   Collection Time   08/19/11  1:52 PM      Component Value Range   Lactic Acid, Venous 1.3  0.5 - 2.2 mmol/L  PROCALCITONIN     Status: Normal   Collection Time   08/19/11  1:52 PM      Component Value Range   Procalcitonin 1.46    CORTISOL     Status: Normal   Collection Time   08/19/11  1:53 PM      Component Value Range   Cortisol, Plasma 34.5    MAGNESIUM     Status: Normal   Collection Time   08/19/11  1:54 PM      Component Value Range   Magnesium 1.7  1.5 - 2.5 mg/dL  PHOSPHORUS     Status: Normal   Collection Time   08/19/11  1:54 PM      Component Value Range   Phosphorus 3.6  2.3 -  4.6 mg/dL  COMPREHENSIVE METABOLIC PANEL     Status: Abnormal   Collection Time   08/19/11  1:54 PM      Component Value Range   Sodium 142  135 - 145 mEq/L   Potassium 5.3 (*) 3.5 - 5.1 mEq/L   Chloride 101  96 - 112 mEq/L   CO2 32  19 - 32 mEq/L   Glucose, Bld 82  70 - 99 mg/dL   BUN 37 (*) 6 - 23 mg/dL   Creatinine, Ser 1.61 (*) 0.50 - 1.10 mg/dL   Calcium 8.5  8.4 - 09.6 mg/dL   Total Protein 4.8 (*) 6.0 - 8.3 g/dL   Albumin 2.3 (*) 3.5 - 5.2 g/dL   AST 38 (*) 0 - 37 U/L   ALT 30  0 - 35 U/L   Alkaline Phosphatase 64   39 - 117 U/L   Total Bilirubin 0.6  0.3 - 1.2 mg/dL   GFR calc non Af Amer 15 (*) >90 mL/min   GFR calc Af Amer 18 (*) >90 mL/min  AMYLASE     Status: Abnormal   Collection Time   08/19/11  1:54 PM      Component Value Range   Amylase 188 (*) 0 - 105 U/L  LIPASE, BLOOD     Status: Abnormal   Collection Time   08/19/11  1:54 PM      Component Value Range   Lipase 474 (*) 11 - 59 U/L  CBC     Status: Abnormal   Collection Time   08/19/11  1:54 PM      Component Value Range   WBC 10.3  4.0 - 10.5 K/uL   RBC 2.94 (*) 3.87 - 5.11 MIL/uL   Hemoglobin 9.0 (*) 12.0 - 15.0 g/dL   HCT 04.5 (*) 40.9 - 81.1 %   MCV 102.7 (*) 78.0 - 100.0 fL   MCH 30.6  26.0 - 34.0 pg   MCHC 29.8 (*) 30.0 - 36.0 g/dL   RDW 91.4 (*) 78.2 - 95.6 %   Platelets 95 (*) 150 - 400 K/uL  DIFFERENTIAL     Status: Abnormal   Collection Time   08/19/11  1:54 PM      Component Value Range   Neutrophils Relative 90 (*) 43 - 77 %   Neutro Abs 9.2 (*) 1.7 - 7.7 K/uL   Lymphocytes Relative 5 (*) 12 - 46 %   Lymphs Abs 0.5 (*) 0.7 - 4.0 K/uL   Monocytes Relative 5  3 - 12 %   Monocytes Absolute 0.5  0.1 - 1.0 K/uL   Eosinophils Relative 1  0 - 5 %   Eosinophils Absolute 0.1  0.0 - 0.7 K/uL   Basophils Relative 0  0 - 1 %   Basophils Absolute 0.0  0.0 - 0.1 K/uL  PROTIME-INR     Status: Normal   Collection Time   08/19/11  1:54 PM      Component Value Range   Prothrombin Time 13.9  11.6 - 15.2 seconds   INR 1.05  0.00 - 1.49  APTT     Status: Normal   Collection Time   08/19/11  1:54 PM      Component Value Range   aPTT 28  24 - 37 seconds  BLOOD GAS, ARTERIAL     Status: Abnormal   Collection Time   08/19/11  2:48 PM      Component Value Range   FIO2 100.00  Delivery systems VENTILATOR     Mode PRESSURE REGULATED VOLUME CONTROL     VT 400     Rate 15     Peep/cpap 5.0     pH, Arterial 7.536 (*) 7.350 - 7.400   pCO2 arterial 37.9  35.0 - 45.0 mmHg   pO2, Arterial 259.0 (*) 80.0 - 100.0 mmHg    Bicarbonate 32.0 (*) 20.0 - 24.0 mEq/L   TCO2 33.2  0 - 100 mmol/L   Acid-Base Excess 8.8 (*) 0.0 - 2.0 mmol/L   O2 Saturation 100.0     Patient temperature 98.6     Collection site RIGHT RADIAL     Drawn by [phone]     Sample type ARTERIAL DRAW     Allens test (pass/fail) PASS  PASS  GLUCOSE, CAPILLARY     Status: Normal   Collection Time   08/19/11  3:45 PM      Component Value Range   Glucose-Capillary 76  70 - 99 mg/dL  CULTURE, RESPIRATORY     Status: Normal (Preliminary result)   Collection Time   08/19/11  4:42 PM      Component Value Range   Specimen Description TRACHEAL ASPIRATE     Special Requests NONE     Gram Stain       Value: FEW WBC PRESENT,BOTH PMN AND MONONUCLEAR     NO SQUAMOUS EPITHELIAL CELLS SEEN     FEW YEAST   Culture Culture reincubated for better growth     Report Status PENDING    URINALYSIS, ROUTINE W REFLEX MICROSCOPIC     Status: Abnormal   Collection Time   08/19/11  5:30 PM      Component Value Range   Color, Urine RED (*) YELLOW   APPearance TURBID (*) CLEAR   Specific Gravity, Urine 1.016  1.005 - 1.030   pH 7.5  5.0 - 8.0   Glucose, UA NEGATIVE  NEGATIVE mg/dL   Hgb urine dipstick LARGE (*) NEGATIVE   Bilirubin Urine SMALL (*) NEGATIVE   Ketones, ur 15 (*) NEGATIVE mg/dL   Protein, ur >045 (*) NEGATIVE mg/dL   Urobilinogen, UA 0.2  0.0 - 1.0 mg/dL   Nitrite NEGATIVE  NEGATIVE   Leukocytes, UA LARGE (*) NEGATIVE  URINE MICROSCOPIC-ADD ON     Status: Abnormal   Collection Time   08/19/11  5:30 PM      Component Value Range   Squamous Epithelial / LPF FEW (*) RARE   WBC, UA TOO NUMEROUS TO COUNT  <3 WBC/hpf   RBC / HPF 7-10  <3 RBC/hpf   Bacteria, UA FEW (*) RARE   Urine-Other MANY YEAST    OCCULT BLOOD X 1 CARD TO LAB, STOOL     Status: Normal   Collection Time   08/19/11  7:04 PM      Component Value Range   Fecal Occult Bld NEGATIVE    GLUCOSE, CAPILLARY     Status: Normal   Collection Time   08/19/11  7:35 PM      Component Value  Range   Glucose-Capillary 83  70 - 99 mg/dL  CARDIAC PANEL(CRET KIN+CKTOT+MB+TROPI)     Status: Abnormal   Collection Time   08/19/11  8:25 PM      Component Value Range   Total CK 798 (*) 7 - 177 U/L   CK, MB 13.0 (*) 0.3 - 4.0 ng/mL   Troponin I <0.30  <0.30 ng/mL   Relative Index 1.6  0.0 -  2.5  GLUCOSE, CAPILLARY     Status: Abnormal   Collection Time   08/20/11 12:17 AM      Component Value Range   Glucose-Capillary 104 (*) 70 - 99 mg/dL   Comment 1 Documented in Chart     Comment 2 Notify RN    GLUCOSE, CAPILLARY     Status: Normal   Collection Time   08/20/11  4:22 AM      Component Value Range   Glucose-Capillary 79  70 - 99 mg/dL  CARDIAC PANEL(CRET KIN+CKTOT+MB+TROPI)     Status: Abnormal   Collection Time   08/20/11  4:25 AM      Component Value Range   Total CK 807 (*) 7 - 177 U/L   CK, MB 14.2 (*) 0.3 - 4.0 ng/mL   Troponin I <0.30  <0.30 ng/mL   Relative Index 1.8  0.0 - 2.5  CBC     Status: Abnormal   Collection Time   08/20/11  4:52 AM      Component Value Range   WBC 11.4 (*) 4.0 - 10.5 K/uL   RBC 2.69 (*) 3.87 - 5.11 MIL/uL   Hemoglobin 8.3 (*) 12.0 - 15.0 g/dL   HCT 81.1 (*) 91.4 - 78.2 %   MCV 100.7 (*) 78.0 - 100.0 fL   MCH 30.9  26.0 - 34.0 pg   MCHC 30.6  30.0 - 36.0 g/dL   RDW 95.6 (*) 21.3 - 08.6 %   Platelets 74 (*) 150 - 400 K/uL  MAGNESIUM     Status: Abnormal   Collection Time   08/20/11  4:52 AM      Component Value Range   Magnesium 1.4 (*) 1.5 - 2.5 mg/dL  PHOSPHORUS     Status: Normal   Collection Time   08/20/11  4:52 AM      Component Value Range   Phosphorus 4.0  2.3 - 4.6 mg/dL  VALPROIC ACID LEVEL     Status: Normal   Collection Time   08/20/11  4:52 AM      Component Value Range   Valproic Acid Lvl 56.0  50.0 - 100.0 ug/mL  COMPREHENSIVE METABOLIC PANEL     Status: Abnormal   Collection Time   08/20/11  4:52 AM      Component Value Range   Sodium 143  135 - 145 mEq/L   Potassium 4.8  3.5 - 5.1 mEq/L   Chloride 105  96 - 112  mEq/L   CO2 23  19 - 32 mEq/L   Glucose, Bld 91  70 - 99 mg/dL   BUN 38 (*) 6 - 23 mg/dL   Creatinine, Ser 5.78 (*) 0.50 - 1.10 mg/dL   Calcium 7.6 (*) 8.4 - 10.5 mg/dL   Total Protein 4.5 (*) 6.0 - 8.3 g/dL   Albumin 2.0 (*) 3.5 - 5.2 g/dL   AST 469 (*) 0 - 37 U/L   ALT 81 (*) 0 - 35 U/L   Alkaline Phosphatase 77  39 - 117 U/L   Total Bilirubin 0.6  0.3 - 1.2 mg/dL   GFR calc non Af Amer 15 (*) >90 mL/min   GFR calc Af Amer 18 (*) >90 mL/min  BLOOD GAS, ARTERIAL     Status: Abnormal   Collection Time   08/20/11  5:16 AM      Component Value Range   FIO2 0.40     Delivery systems VENTILATOR     Mode PRESSURE REGULATED VOLUME CONTROL  VT 400     Rate 15.0     Peep/cpap 5.0     pH, Arterial 7.460 (*) 7.350 - 7.400   pCO2 arterial 34.3 (*) 35.0 - 45.0 mmHg   pO2, Arterial 123.0 (*) 80.0 - 100.0 mmHg   Bicarbonate 24.0  20.0 - 24.0 mEq/L   TCO2 25.1  0 - 100 mmol/L   Acid-Base Excess 0.6  0.0 - 2.0 mmol/L   O2 Saturation 100.0     Patient temperature 98.6     Collection site RIGHT RADIAL     Drawn by [id number]     Sample type ARTERIAL DRAW     Allens test (pass/fail) PASS  PASS  GLUCOSE, CAPILLARY     Status: Normal   Collection Time   08/20/11  7:31 AM      Component Value Range   Glucose-Capillary 78  70 - 99 mg/dL   Lipid Panel No results found for this basename: CHOL,TRIG,HDL,CHOLHDL,VLDL,LDLCALC in the last 72 hours  Studies/Results: Dg Chest Port 1 View  08/19/2011  *RADIOLOGY REPORT*  Clinical Data: Central line placement.  PORTABLE CHEST - 1 VIEW  Comparison: 08/19/2011  Findings: Left central line is in place with the tip in the SVC. No pneumothorax.  Endotracheal tube tip is 4 cm above the carina. Low lung volumes with bibasilar atelectasis.  Cardiomegaly.  Mild vascular congestion.  IMPRESSION: Left central line tip in the SVC.  No pneumothorax.  Endotracheal tube 4 cm above the carina.  Otherwise no change.  Original Report Authenticated By: Cyndie Chime, M.D.     Dg Chest Port 1 View  08/19/2011  *RADIOLOGY REPORT*  Clinical Data: Respiratory distress.  PORTABLE CHEST - 1 VIEW  Comparison: 08/07/2011  Findings: Low lung volumes with bibasilar atelectasis. Cardiomegaly with vascular congestion.  No acute bony abnormality.  IMPRESSION: Cardiomegaly with vascular congestion.  No overt edema.  Low lung volumes with bibasilar atelectasis.  Original Report Authenticated By: Cyndie Chime, M.D.   Dg Abd 2 Views  08/19/2011  *RADIOLOGY REPORT*  Clinical Data: Abdominal pain.  Abdominal distension.  ABDOMEN - 2 VIEW  Comparison: Multiple priors, most recently 08/11/2011.  Findings: There is some gas and stool scattered throughout the colon.  There are multiple dilated gas-filled loops of small bowel measuring up to approximately 5.6 cm in diameter.  Multiple air fluid levels are noted on the left lateral decubitus view.  No definite pneumoperitoneum is identified. The stomach appears to be massively dilated and filled with gas.  There is a relative paucity of bowel gas in the central abdomen on the supine view.  Surgical clips project over the right upper quadrant of the abdomen, and there are two residual skin staples projecting over the mid abdomen and lower anatomic pelvis.  Other previously noted skin staples have been removed.  Postoperative changes of right total hip arthroplasty are incompletely visualized.  IMPRESSION: 1.  Nonspecific bowel gas pattern, as above, favored to reflect persistent postoperative ileus.  Partial small bowel obstruction is difficult to entirely excluded, however, and could have a similar appearance. 2.  Increasing gastric distension.  No nasogastric tube is identified. 3.  Postoperative changes, as above.  Original Report Authenticated By: Florencia Reasons, M.D.    Medications:     Scheduled:   . albuterol  2.5 mg Nebulization Q6H   And  . ipratropium  0.5 mg Nebulization Q6H  . antiseptic oral rinse  15 mL Mouth Rinse QID  .  chlorhexidine  15  mL Mouth Rinse BID  . fentaNYL      . LORazepam  2 mg Intravenous Once  . midazolam  2 mg Intravenous Once  . pantoprazole (PROTONIX) IV  40 mg Intravenous QHS  . phenytoin (DILANTIN) IV  100 mg Intravenous Q8H  . piperacillin-tazobactam (ZOSYN)  IV  2.25 g Intravenous Q8H  . sodium chloride  500 mL Intravenous Once  . valproate sodium  1,000 mg Intravenous Once  . valproate sodium  500 mg Intravenous Q12H  . vancomycin  1,000 mg Intravenous Q48H  . DISCONTD: antiseptic oral rinse  15 mL Mouth Rinse q12n4p  . DISCONTD: atenolol  25 mg Oral BID  . DISCONTD: bisacodyl  10 mg Rectal Daily  . DISCONTD: chlorhexidine  15 mL Mouth Rinse BID  . DISCONTD: cloNIDine  0.3 mg Transdermal Weekly  . DISCONTD: darbepoetin (ARANESP) injection - NON-DIALYSIS  60 mcg Subcutaneous Q Sat-1800  . DISCONTD: insulin aspart  0-9 Units Subcutaneous Q4H  . DISCONTD: lip balm  1 application Topical BID  . DISCONTD: LORazepam  1 mg Intravenous Once  . DISCONTD: pantoprazole  40 mg Oral Q1200  . DISCONTD: phenytoin (DILANTIN) IV  1,000 mg Intravenous Once  . DISCONTD: predniSONE  20 mg Oral QAC breakfast    Assessment/Plan:    Patient Active Hospital Problem List:  Seizure, convulsive (08/12/2011)   Assessment: 78 YO female with recurrent seizure while therapeutic on dilantin along with respiratory distress and hypotension. Patient necessitated intubation and transfer to ICU.  Currently stable, intubated and receiving Dilantin 100 mg BID along with Depakote 500 mg BID (Current VPA level 56.1) with no recurrent seizure. MG+ and phosphorus were normal.  EEG 08/19/11 showed no epileptiform activity.     Plan:  1) Patient shows no recurrent seizure and stable on VPA and Dilantin. Will have pharmacy follow Dilantin and VPA levels.  Will need to watch LFT's given both are cleared through the liver and recent labs show slight elevation in AST and ALT, although this could be multifactorial  (hypotension, shock, infection).  Continue to treat metabolic abnormalities.   PRES   Plan: Continue BP control and stabilization.       Felicie Morn PA-C [facility] [phone]  08/20/2011, 10:43 AM

## 2011-08-20 NOTE — Procedures (Signed)
EEG NUMBER:  REFERRING PHYSICIAN:  Dr. Katrinka Blazing.  HISTORY:  A 78 year old female with seizures.  MEDICATIONS:  Keppra, Ativan, Versed, Lopressor, Protonix, Dilantin, Sublimaze, Diprivan.  CONDITIONS OF RECORDING:  This is a 16-channel EEG carried out the patient in the unresponsive state.  DESCRIPTION:  The background activity consists of stage II sleep with background rhythms noted to be a polymorphic delta activity of low-to- moderate voltage with superimposed symmetrical sleep spindles and vertex central sharp transients.  Wakefulness was not obtained. Hyperventilation was not performed.  Intermittent photic stimulation failed to elicit any change in the tracing.  IMPRESSION:  This is a normal sleep EEG.  No epileptiform activity was noted.          ______________________________ Thana Farr, MD    [id number] D:  08/20/2011 09:51:58  T:  08/20/2011 11:46:43  Job #:  [id number]

## 2011-08-20 NOTE — Progress Notes (Signed)
eLink Physician-Brief Progress Note Patient Name: [name] DOB: [birth year] MRN: [id number]  Date of Service  08/20/2011   HPI/Events of Note   Received 3L of fluids, CVP 6, BP 89/43  eICU Interventions  Will accept SBP 85 & above for now, bolus only if symptomatic for this ESRD pt   Intervention Category Intermediate Interventions: Hypotension - evaluation and management  Michaelyn Wall V. 08/20/2011, 1:11 AM

## 2011-08-20 NOTE — Progress Notes (Signed)
[facility]  Subjective:  Intubated,got versed at 6 AM today   Objective: Vital signs in last 24 hours: Blood pressure 122/54, pulse 93, temperature 97.1 F (36.2 C), temperature source Oral, resp. rate 24, height 5\' 3"  (1.6 m), weight 68.5 kg (151 lb 0.2 oz), SpO2 100.00%.    PHYSICAL EXAM General--twitching around shoulders, intubated, sedated Chest--rhonchi Heart--no rub Abd--postop, nontender Extr--no edema, AVF patent L upper arm  Weight 59.9 kg on 11 Jun, 63.9 kg yesterday, 68.5 kg today!  Lab Results:   Lab 08/20/11 0452 08/19/11 1354 08/19/11 0930 08/15/11 0335  NA 143 142 142 --  K 4.8 5.3* 5.5* --  CL 105 101 101 --  CO2 23 32 32 --  BUN 38* 37* 36* --  CREATININE 2.77* 2.78* 2.65* --  ALB -- -- -- --  GLUCOSE 91 -- -- --  CALCIUM 7.6* 8.5 8.6 --  PHOS 4.0 3.6 -- 3.2     Basename 08/20/11 0452 08/19/11 1354  WBC 11.4* 10.3  HGB 8.3* 9.0*  HCT 27.1* 30.2*  PLT 74* 95*    Assessment/Plan: 1. ARF on Chronic kidney disease stage IV: Baseline CKD, creatinine  2.8 today , has access in place. ARF due to ATN vs hypoperfusion. Creat had been stable with  UOP. Dialyzed Sunday night for high K.  No indications currently.  BP low yesterday--CVP low--given 3 L IV Fluid.  Leave foley in today.  2. AMS/Seizure- post ictal/sedated, neuro to see. Curently intubated. Given dilantin and valproate 3. Hypertension--BP ok currently, CVP 7 4. Anemia- on aranesp, Fe/TIBC 36%, ferritin 869 on 15 Jun, hemocult negative.  Will check Fe studies again  5. Metabolic bone disease: phos WNL,  PTH pending 6. S/p ventral hernia repair with ileus-per CCS  7. Hyperkalemia-K 4.8 today  8. Hypernatremia-Na 143 today.   CXR, CBC + renal profile tomorrow     LOS: 14 days   Emmajane Altamura F 08/20/2011,9:37 AM   .labalb

## 2011-08-20 NOTE — Progress Notes (Signed)
Patient ID: Dante Gang, female   DOB: [birth year], 78 y.o.   MRN: [id number]\ 14 Days Post-Op   Central  Surgery Progress Note Subjective: Intubated, not sedated, patient opens eyes to stimuli, does not follow commands.   Objective: Vital signs in last 24 hours: Temp:  [95.9 F (35.5 C)-98.3 F (36.8 C)] 97.1 F (36.2 C) (06/25 0735) Pulse Rate:  [70-90] 89  (06/25 0700) Resp:  [15-31] 16  (06/25 0700) BP: (63-148)/(31-123) 94/43 mmHg (06/25 0700) SpO2:  [98 %-100 %] 100 % (06/25 0700) FiO2 (%):  [39.9 %-100 %] 40 % (06/25 0700) Weight:  [140 lb 14 oz (63.9 kg)-151 lb 0.2 oz (68.5 kg)] 151 lb 0.2 oz (68.5 kg) (06/25 0500) Last BM Date: 08/19/11  Intake/Output from previous day: 06/24 0701 - 06/25 0700 In: 5316 [I.V.:400; IV Piggyback:4916] Out: 280 [Urine:280] Intake/Output this shift:   Incision/Wound: Minimal serosanguinous drainage.   Abdomen: firm, hypoactive BS, + BM. Extremities: 2+ pitting edema  Lab Results:   Basename 08/20/11 0452 08/19/11 1354  WBC 11.4* 10.3  HGB 8.3* 9.0*  HCT 27.1* 30.2*  PLT 74* 95*   BMET  Basename 08/20/11 0452 08/19/11 1354  NA 143 142  K 4.8 5.3*  CL 105 101  CO2 23 32  GLUCOSE 91 82  BUN 38* 37*  CREATININE 2.77* 2.78*  CALCIUM 7.6* 8.5   PT/INR  Basename 08/19/11 1354 08/17/11 1010  LABPROT 13.9 12.3  INR 1.05 0.90   ABG  Basename 08/20/11 0516 08/19/11 1448  PHART 7.460* 7.536*  HCO3 24.0 32.0*    Studies/Results: Dg Chest Port 1 View  08/19/2011  *RADIOLOGY REPORT*  Clinical Data: Central line placement.  PORTABLE CHEST - 1 VIEW  Comparison: 08/19/2011  Findings: Left central line is in place with the tip in the SVC. No pneumothorax.  Endotracheal tube tip is 4 cm above the carina. Low lung volumes with bibasilar atelectasis.  Cardiomegaly.  Mild vascular congestion.  IMPRESSION: Left central line tip in the SVC.  No pneumothorax.  Endotracheal tube 4 cm above the carina.  Otherwise no change.  Original  Report Authenticated By: Cyndie Chime, M.D.   Dg Chest Port 1 View  08/19/2011  *RADIOLOGY REPORT*  Clinical Data: Respiratory distress.  PORTABLE CHEST - 1 VIEW  Comparison: 08/07/2011  Findings: Low lung volumes with bibasilar atelectasis. Cardiomegaly with vascular congestion.  No acute bony abnormality.  IMPRESSION: Cardiomegaly with vascular congestion.  No overt edema.  Low lung volumes with bibasilar atelectasis.  Original Report Authenticated By: Cyndie Chime, M.D.   Dg Abd 2 Views  08/19/2011  *RADIOLOGY REPORT*  Clinical Data: Abdominal pain.  Abdominal distension.  ABDOMEN - 2 VIEW  Comparison: Multiple priors, most recently 08/11/2011.  Findings: There is some gas and stool scattered throughout the colon.  There are multiple dilated gas-filled loops of small bowel measuring up to approximately 5.6 cm in diameter.  Multiple air fluid levels are noted on the left lateral decubitus view.  No definite pneumoperitoneum is identified. The stomach appears to be massively dilated and filled with gas.  There is a relative paucity of bowel gas in the central abdomen on the supine view.  Surgical clips project over the right upper quadrant of the abdomen, and there are two residual skin staples projecting over the mid abdomen and lower anatomic pelvis.  Other previously noted skin staples have been removed.  Postoperative changes of right total hip arthroplasty are incompletely visualized.  IMPRESSION: 1.  Nonspecific bowel  gas pattern, as above, favored to reflect persistent postoperative ileus.  Partial small bowel obstruction is difficult to entirely excluded, however, and could have a similar appearance. 2.  Increasing gastric distension.  No nasogastric tube is identified. 3.  Postoperative changes, as above.  Original Report Authenticated By: Florencia Reasons, M.D.    Anti-infectives: Anti-infectives     Start     Dose/Rate Route Frequency Ordered Stop   08/19/11 1400   vancomycin (VANCOCIN)  IVPB 1000 mg/200 mL premix        1,000 mg 200 mL/hr over 60 Minutes Intravenous Every 48 hours 08/19/11 1257     08/19/11 1330   piperacillin-tazobactam (ZOSYN) IVPB 2.25 g        2.25 g 100 mL/hr over 30 Minutes Intravenous 3 times per day 08/19/11 1257     08/14/11 1800   cefTRIAXone (ROCEPHIN) 1 g in dextrose 5 % 50 mL IVPB  Status:  Discontinued        1 g 100 mL/hr over 30 Minutes Intravenous Every 24 hours 08/14/11 1706 08/16/11 1354   08/06/11 1400   ceFAZolin (ANCEF) IVPB 1 g/50 mL premix  Status:  Discontinued        1 g 100 mL/hr over 30 Minutes Intravenous 60 min pre-op 08/06/11 1313 08/06/11 1800          Assessment/Plan: s/p Procedure(s) (LRB): HERNIA REPAIR VENTRAL ADULT (N/A)- Healing well, patient likely has illeus but no acute surgical abdomen Seizure disorder - patient with more seizure activity yesterday and acute respiratory failure, transferred to ICU and intubated, Neurology following.  Mental status remains poor Acute Respiratory failure- intubated, transferred to CCM service Acute on Chronic renal failure- received HD 6/23 for hyperkalemia, which improved.  Renal following.  CV- patient hypotensive but improved from yesterday with fluid boluses.  Unclear if this shock is from sepsis/infection ? Source. Unlikely cardiogenic shock- ECHO done yesterday shows normal EF.  Hemoglobin stable, do not think hemorrhagic shock.    Patient is critically ill with multi-organ system failure.  Unclear is she will be able to make a meaningful recovery.    LOS: 14 days  Ules Marsala 08/20/2011

## 2011-08-20 NOTE — Progress Notes (Signed)
MEDICATION RELATED CONSULT NOTE - FOLLOW UP  Pharmacy Consult:  Dilantin/Valproate Indication: Seizure-like activity  No Known Allergies  Patient Measurements: Height: 5\' 3"  (160 cm) Weight: 151 lb 0.2 oz (68.5 kg) IBW/kg (Calculated) : 52.4   Vital Signs: Temp: 97.1 F (36.2 C) (06/25 0735) Temp src: Oral (06/25 0735) BP: 122/42 mmHg (06/25 1100) Pulse Rate: 92  (06/25 1100) Intake/Output from previous day: 06/24 0701 - 06/25 0700 In: 5316 [I.V.:400; IV Piggyback:4916] Out: 280 [Urine:280]  Labs:  [facility] 08/20/11 0452 08/19/11 1354 08/19/11 0930 08/19/11 0437 08/18/11 0515  WBC 11.4* 10.3 -- 11.3* --  HGB 8.3* 9.0* -- 9.4* --  HCT 27.1* 30.2* -- 31.6* --  PLT 74* 95* -- 100* --  APTT -- 28 -- -- --  CREATININE 2.77* 2.78* 2.65* -- --  LABCREA -- -- -- -- --  CREATININE 2.77* 2.78* 2.65* -- --  CREAT24HRUR -- -- -- -- --  MG 1.4* 1.7 -- -- --  PHOS 4.0 3.6 -- -- --  ALBUMIN 2.0* 2.3* -- -- 2.7*  PROT 4.5* 4.8* -- -- 5.4*  ALBUMIN 2.0* 2.3* -- -- 2.7*  AST 150* 38* -- -- 13  ALT 81* 30 -- -- 20  ALKPHOS 77 64 -- -- 66  BILITOT 0.6 0.6 -- -- 0.5  BILIDIR -- -- -- -- --  IBILI -- -- -- -- --   Estimated Creatinine Clearance: 15.8 ml/min (by C-G formula based on Cr of 2.77).   Assessment: Was asked by neurology PA, Felicie Morn to follow seizure medications along with them for potential LFT interactions since these are metabolized via glucuronide conjugation and oxidation.  This 83 YOF with EEG and MRI suggestive of encephalopathy s/p elevated ventral hernia repair on 08/06/11 followed by sudden onset unresponsiveness and seizure like action on 08/12/11. Patient on Keppra PTA for similar episode in 2010. Keppra discontinued and IV Phenytoin started after her seizure activity on 6/17.  Neurology added Valproate with a 1gm load on 6/24 and maintenance of 500mg  twice daily.    6/19: DPH 13.6 post-load, corrects up to 20 6/24: DPH 10, albumin 2.7, corrected 15.6 6/25:   Valproic Acid Lvl 56.0 ug/ml which is lower end of goal   Goal of Therapy:  Phenytoin level 10-20 mcg/mL Valproate 50-100 mcg/ml  Plan:  - Continue Dilantin 100mg  IV Q8H - Valproate 500mg  IV Q12H - Monitor LFT's - Will check Dilantin and Valproate acid levels again next week.  Nadara Mustard, PharmD., MS Clinical Pharmacist Pager:  [phone]  Thank you for allowing pharmacy to be part of this patients care team. 08/20/2011, 11:33 AM

## 2011-08-21 ENCOUNTER — Inpatient Hospital Stay (HOSPITAL_COMMUNITY): Payer: Medicare Other

## 2011-08-21 ENCOUNTER — Other Ambulatory Visit: Payer: Medicare Other | Admitting: Lab

## 2011-08-21 ENCOUNTER — Ambulatory Visit: Payer: Medicare Other

## 2011-08-21 DIAGNOSIS — Z862 Personal history of diseases of the blood and blood-forming organs and certain disorders involving the immune mechanism: Secondary | ICD-10-CM

## 2011-08-21 DIAGNOSIS — B3749 Other urogenital candidiasis: Secondary | ICD-10-CM | POA: Diagnosis not present

## 2011-08-21 DIAGNOSIS — G40401 Other generalized epilepsy and epileptic syndromes, not intractable, with status epilepticus: Secondary | ICD-10-CM

## 2011-08-21 DIAGNOSIS — R569 Unspecified convulsions: Secondary | ICD-10-CM

## 2011-08-21 DIAGNOSIS — K7290 Hepatic failure, unspecified without coma: Secondary | ICD-10-CM

## 2011-08-21 LAB — COMPREHENSIVE METABOLIC PANEL
AST: 120 U/L — ABNORMAL HIGH (ref 0–37)
Albumin: 1.7 g/dL — ABNORMAL LOW (ref 3.5–5.2)
Chloride: 106 mEq/L (ref 96–112)
Creatinine, Ser: 2.96 mg/dL — ABNORMAL HIGH (ref 0.50–1.10)
Potassium: 4.6 mEq/L (ref 3.5–5.1)
Sodium: 141 mEq/L (ref 135–145)
Total Bilirubin: 0.4 mg/dL (ref 0.3–1.2)

## 2011-08-21 LAB — CBC
HCT: 23.9 % — ABNORMAL LOW (ref 36.0–46.0)
Hemoglobin: 7.6 g/dL — ABNORMAL LOW (ref 12.0–15.0)
MCH: 31.5 pg (ref 26.0–34.0)
MCHC: 31.8 g/dL (ref 30.0–36.0)
MCV: 99.2 fL (ref 78.0–100.0)
Platelets: 61 10*3/uL — ABNORMAL LOW (ref 150–400)
RBC: 2.41 MIL/uL — ABNORMAL LOW (ref 3.87–5.11)
RDW: 16.4 % — ABNORMAL HIGH (ref 11.5–15.5)
WBC: 9.1 10*3/uL (ref 4.0–10.5)

## 2011-08-21 LAB — CULTURE, RESPIRATORY W GRAM STAIN

## 2011-08-21 LAB — CLOSTRIDIUM DIFFICILE BY PCR: Toxigenic C. Difficile by PCR: NEGATIVE

## 2011-08-21 LAB — GLUCOSE, CAPILLARY
Glucose-Capillary: 72 mg/dL (ref 70–99)
Glucose-Capillary: 68 mg/dL — ABNORMAL LOW (ref 70–99)
Glucose-Capillary: 70 mg/dL (ref 70–99)
Glucose-Capillary: 105 mg/dL — ABNORMAL HIGH (ref 70–99)

## 2011-08-21 LAB — OCCULT BLOOD X 1 CARD TO LAB, STOOL: Fecal Occult Bld: NEGATIVE

## 2011-08-21 LAB — AMMONIA: Ammonia: 37 umol/L (ref 11–60)

## 2011-08-21 MED ORDER — DEXTROSE 50 % IV SOLN: 25.0000 mL | Freq: Once | INTRAVENOUS | Status: AC | PRN

## 2011-08-21 MED ORDER — HYDROCORTISONE SOD SUCCINATE 100 MG IJ SOLR
50.0000 mg | Freq: Two times a day (BID) | INTRAMUSCULAR | Status: DC
  Administered 2011-08-21 – 2011-08-22 (×3): 50 mg via INTRAVENOUS
  Filled 2011-08-21 (×4): qty 1

## 2011-08-21 MED ORDER — MODAFINIL 200 MG PO TABS
200.0000 mg | ORAL_TABLET | Freq: Every day | ORAL | Status: DC
  Administered 2011-08-22: 200 mg via ORAL
  Filled 2011-08-21: qty 1

## 2011-08-21 MED ORDER — FENTANYL CITRATE 0.05 MG/ML IJ SOLN: 12.5000 ug | INTRAMUSCULAR | Status: DC | PRN

## 2011-08-21 MED ORDER — FLUCONAZOLE IN SODIUM CHLORIDE 200-0.9 MG/100ML-% IV SOLN
200.0000 mg | INTRAVENOUS | Status: DC
  Administered 2011-08-21 – 2011-08-22 (×2): 200 mg via INTRAVENOUS
  Filled 2011-08-21 (×3): qty 100

## 2011-08-21 MED ORDER — MIDAZOLAM HCL 2 MG/2ML IJ SOLN: 1.0000 mg | INTRAMUSCULAR | Status: DC | PRN

## 2011-08-21 MED ORDER — DEXTROSE 50 % IV SOLN
INTRAVENOUS | Status: AC
  Administered 2011-08-21: 25 mL
  Filled 2011-08-21: qty 50

## 2011-08-21 MED ORDER — FAMOTIDINE 40 MG/5ML PO SUSR
20.0000 mg | Freq: Every day | ORAL | Status: DC
  Administered 2011-08-21: 20 mg
  Filled 2011-08-21 (×3): qty 2.5

## 2011-08-21 NOTE — Progress Notes (Signed)
Pt profile: 79 yowf with PMH of DM, CKD, Htn, ITP (chronic low dose pred) and one prior seizure with negative EEG. Underwent ventral hernia repair 6/11. Suffered tonic-clonic seizure 6/17. Required urgent HD 6/22 for hyperkalemia. Suffered recurrent tonic clonic seizures 6/24 requiring 4 mg Ativan IV. Intubated for depressed LOC. PCCM assumed care 6/24  Active problems: Principal Problem:  *Acute respiratory failure Active Problems:  Acute on chronic renal insufficiency  Seizure, convulsive  Encephalopathy acute  Status post laparotomy  Thrombocytopenia  Anemia of renal disease  Recurrent incisional hernia with incarceration  Ileus following gastrointestinal surgery  Hyperkalemia  Hypotension   Subj: RASS -2. Opens eyes to voice. Not F/C  Obj: Filed Vitals:   08/21/11 1600  BP: 116/47  Pulse: 100  Temp:   Resp: 17    Gen: chronically ill appearing, NAD HEENT: WNL Neck: JVP not visualized, no LAN Chest: slightly coarse B BS Cardiac: RRR s M Abd: Mildly distended and tympanitic, diminished to absent BS, ecchymosis over LLQ to L flank Ext: trace symmetric LE edema    CXR: NSC low volumes with bilateral basilar atelectasis  BMET    Component Value Date/Time   NA 141 08/21/2011 0439   K 4.6 08/21/2011 0439   CL 106 08/21/2011 0439   CO2 23 08/21/2011 0439   GLUCOSE 71 08/21/2011 0439   BUN 42* 08/21/2011 0439   CREATININE 2.96* 08/21/2011 0439   CALCIUM 7.2* 08/21/2011 0439   GFRNONAA 14* 08/21/2011 0439   GFRAA 17* 08/21/2011 0439    Lab Results  Component Value Date   WBC 9.1 08/21/2011   HGB 7.6* 08/21/2011   HCT 23.9* 08/21/2011   MCV 99.2 08/21/2011   PLT 61* 08/21/2011       IMPRESSION/PLAN:   Acute respiratory failure due to decreased LOC post seizure - cont current vent support - Daily WUA/SBT - minimize sedation   Hypotension - resolved   Seizure, convulsive  Encephalopathy acute - Anticonvulsants per Neuro - Minimize sedation   Acute on  chronic renal insufficiency  Hyperkalemia - resolved - Monitor daily BMET - Monitor I/Os   Anemia of renal disease  Thrombocytopenia - No indication for RBCs or platelets presently - LMWH stopped 6/24 - SCDs for DVT prophy    Recurrent incisional hernia with incarceration  Ileus following gastrointestinal surgery - resolved  Status post laparotomy  Diarrhea - Post op mgmt per CCS - advance TFs - Stool for C diff - Hemoccult  Candiduria  - Fluconazole ordered 6/26  Discussed with Dr Corliss Skains Granddaughter updated @ bedside  CCM 35 mins  Billy Fischer, MD ; [facility] service Mobile [phone].  After 5:30 PM or weekends, call [phone]

## 2011-08-21 NOTE — Progress Notes (Signed)
Incision with minimal drainage.  No sign of infection Large amount of diarrhea in place.  [name]. Corliss Skains, MD, [facility]  08/21/2011 10:57 AM

## 2011-08-21 NOTE — Progress Notes (Signed)
ANTIBIOTIC CONSULT NOTE - F/U  Pharmacy Consult for Vancomycin and Zosyn - Fluconazole added Indication: Sepsis and Candida in urine  No Known Allergies  Patient Measurements: Height: 5\' 3"  (160 cm) Weight: 150 lb 5.7 oz (68.2 kg) IBW/kg (Calculated) : 52.4   Vital Signs: Temp: 99.2 F (37.3 C) (06/26 0754) Temp src: Oral (06/26 0754) BP: 96/44 mmHg (06/26 0900) Pulse Rate: 100  (06/26 0900) Intake/Output from previous day: 06/25 0701 - 06/26 0700 In: 989 [I.V.:640; NG/GT:80; IV Piggyback:269] Out: 320 [Urine:280; Stool:40] Intake/Output from this shift: Total I/O In: 110 [I.V.:60; NG/GT:50] Out: 60 [Urine:60] Labs:  [facility] 08/21/11 0439 08/20/11 0452 08/19/11 1354  WBC 9.1 11.4* 10.3  HGB 7.6* 8.3* 9.0*  PLT 61* 74* 95*  LABCREA -- -- --  CREATININE 2.96* 2.77* 2.78*   Estimated Creatinine Clearance: 14.7 ml/min (by C-G formula based on Cr of 2.96).  Microbiology: Recent Results (from the past 720 hour(s))  URINE CULTURE     Status: Normal   Collection Time   08/04/11  2:53 PM      Component Value Range Status Comment   Specimen Description URINE, CLEAN CATCH   Final    Special Requests NONE   Final    Culture  Setup Time [id number]   Final    Colony Count >=100,000 COLONIES/ML   Final    Culture ESCHERICHIA COLI   Final    Report Status 08/06/2011 FINAL   Final    Organism ID, Bacteria ESCHERICHIA COLI   Final   SURGICAL PCR SCREEN     Status: Normal   Collection Time   08/06/11  1:28 PM      Component Value Range Status Comment   MRSA, PCR NEGATIVE  NEGATIVE Final    Staphylococcus aureus NEGATIVE  NEGATIVE Final   URINE CULTURE     Status: Normal   Collection Time   08/14/11  5:39 PM      Component Value Range Status Comment   Specimen Description URINE, CATHETERIZED   Final    Special Requests NONE   Final    Culture  Setup Time [id number]   Final    Colony Count NO GROWTH   Final    Culture NO GROWTH   Final    Report Status 08/15/2011 FINAL    Final   CULTURE, BLOOD (ROUTINE X 2)     Status: Normal (Preliminary result)   Collection Time   08/19/11  1:15 PM      Component Value Range Status Comment   Specimen Description BLOOD RIGHT HAND   Final    Special Requests BOTTLES DRAWN AEROBIC ONLY 4CC   Final    Culture  Setup Time [id number]   Final    Culture     Final    Value:        BLOOD CULTURE RECEIVED NO GROWTH TO DATE CULTURE WILL BE HELD FOR 5 DAYS BEFORE ISSUING A FINAL NEGATIVE REPORT   Report Status PENDING   Incomplete   CULTURE, BLOOD (ROUTINE X 2)     Status: Normal (Preliminary result)   Collection Time   08/19/11  1:30 PM      Component Value Range Status Comment   Specimen Description BLOOD RIGHT HAND   Final    Special Requests BOTTLES DRAWN AEROBIC ONLY [facility]   Final    Culture  Setup Time [id number]   Final    Culture     Final    Value:  BLOOD CULTURE RECEIVED NO GROWTH TO DATE CULTURE WILL BE HELD FOR 5 DAYS BEFORE ISSUING A FINAL NEGATIVE REPORT   Report Status PENDING   Incomplete   CULTURE, RESPIRATORY     Status: Normal   Collection Time   08/19/11  4:42 PM      Component Value Range Status Comment   Specimen Description TRACHEAL ASPIRATE   Final    Special Requests NONE   Final    Gram Stain     Final    Value: FEW WBC PRESENT,BOTH PMN AND MONONUCLEAR     NO SQUAMOUS EPITHELIAL CELLS SEEN     FEW YEAST   Culture FEW CANDIDA ALBICANS   Final    Report Status 08/21/2011 FINAL   Final   URINE CULTURE     Status: Normal   Collection Time   08/19/11  5:30 PM      Component Value Range Status Comment   Specimen Description URINE, CATHETERIZED   Final    Special Requests Normal   Final    Culture  Setup Time [id number]   Final    Colony Count >=100,000 COLONIES/ML   Final    Culture YEAST   Final    Report Status 08/20/2011 FINAL   Final    Medical History: Past Medical History  Diagnosis Date  . Hypertension   . Anxiety and depression   . Chronic headaches   . Diverticular disease     . Anemia   . Arthritis     osteoarthritis  . Osteoporosis   . Vitamin B 12 deficiency   . History of ITP   . Anxiety     takes xanax for sleep  . Diabetes mellitus     NIDDM x 12 years  . Heart murmur     benign;asymptomatic  . Chronic kidney disease     esrd   Medications:  ABX:  Zosyn 2.25 gm IV every 8 hours  Vancomcyin 1gm IV every 48 hours  Assessment: 78yo female with yeast UTI, to be started on IV Fluconazole.  She has acute renal failure on chronic renal failure with an estimated clearance of ~ 64ml/min.  [name] also receiving IV Vancomycin and Zosyn without noted complications.  Goal of Therapy:  Vancomycin trough level 15-20 mcg/ml  Plan:  1.  Continue Zosyn 2.25gm IV q8h 2.  Continue Vancomycin 1gm IV q48h 3.  Begin IV Fluconazole 200 mg daily 4.  Follow microbiological data, renal function, HD plans, and pt's clinical condition.  Nadara Mustard, PharmD., MS Clinical Pharmacist Pager:  [phone]  Thank you for allowing pharmacy to be part of this patients care team. 08/21/2011,11:01 AM

## 2011-08-21 NOTE — Progress Notes (Signed)
Chaplain Note:  Chaplain visited with pt and pt's daughter. Pt was in bed, intubated, and asleep.  She did not awaken during this visit. Chaplain provided spiritual comfort and support for pt's daughter, encouraging her to take care of her own health while she waited at bedside.  She seemed weary of waiting. Chaplain will follow up as needed.

## 2011-08-21 NOTE — Progress Notes (Signed)
Pt. Had a blood sugar of 68 this morning. I gave 1/2amp D50 and will recheck.

## 2011-08-21 NOTE — Progress Notes (Signed)
The patient tested negative for C. Diff so I have removed the [employer] Precautions.

## 2011-08-21 NOTE — Progress Notes (Signed)
MD wrote orders to check for C. Diff, and I placed the patient on brown contact precautions to rule out C. Diff. I educated the patient and family members.

## 2011-08-21 NOTE — Progress Notes (Signed)
Subjective:  Patient seen at the bedside, she appears hypersomnolent but can be easily aroused. No  Complications overnight Remains intubated.  Nods head for yes and no   Objective: Current vital signs: BP 92/39  Pulse 99  Temp 97.8 F (36.6 C) (Oral)  Resp 15  Ht 5\' 3"  (1.6 m)  Wt 68.2 kg (150 lb 5.7 oz)  BMI 26.63 kg/m2  SpO2 97% Vital signs in last 24 hours: Temp:  [97.8 F (36.6 C)-99.8 F (37.7 C)] 97.8 F (36.6 C) (06/26 1146) Pulse Rate:  [95-107] 99  (06/26 1700) Resp:  [15-28] 15  (06/26 1700) BP: (84-141)/(33-89) 92/39 mmHg (06/26 1700) SpO2:  [97 %-100 %] 97 % (06/26 1700) FiO2 (%):  [29.6 %-31.1 %] 30.2 % (06/26 1700) Weight:  [68.2 kg (150 lb 5.7 oz)] 68.2 kg (150 lb 5.7 oz) (06/26 0500)  Intake/Output from previous day: 06/25 0701 - 06/26 0700 In: 989 [I.V.:640; NG/GT:80; IV Piggyback:269] Out: 320 [Urine:280; Stool:40] Intake/Output this shift: Total I/O In: 607.5 [I.V.:232.5; NG/GT:220; IV Piggyback:155] Out: 425 [Urine:225; Stool:200] Nutritional status:    Genera:  Intubated Lungs: CTA Heart; IRR Abdomen: bowel sounds present Extremities: Edema with ecchymoses Skin: ecchymoses  Neurologic Exam: Hypersomnolent but can be aroused Pupils reactive Corneal reactive Withdraws to pain, Moves  Extremities with painful stimuli Significant dystonia with movement.   Lab Results: Results for orders placed during the hospital encounter of 08/06/11 (from the past 48 hour(s))  OCCULT BLOOD X 1 CARD TO LAB, STOOL     Status: Normal   Collection Time   08/19/11  7:04 PM      Component Value Range Comment   Fecal Occult Bld NEGATIVE     GLUCOSE, CAPILLARY     Status: Normal   Collection Time   08/19/11  7:35 PM      Component Value Range Comment   Glucose-Capillary 83  70 - 99 mg/dL   CARDIAC PANEL(CRET KIN+CKTOT+MB+TROPI)     Status: Abnormal   Collection Time   08/19/11  8:25 PM      Component Value Range Comment   Total CK 798 (*) 7 - 177 U/L    CK, MB 13.0 (*) 0.3 - 4.0 ng/mL    Troponin I <0.30  <0.30 ng/mL    Relative Index 1.6  0.0 - 2.5   GLUCOSE, CAPILLARY     Status: Abnormal   Collection Time   08/20/11 12:17 AM      Component Value Range Comment   Glucose-Capillary 104 (*) 70 - 99 mg/dL    Comment 1 Documented in Chart      Comment 2 Notify RN     GLUCOSE, CAPILLARY     Status: Normal   Collection Time   08/20/11  4:22 AM      Component Value Range Comment   Glucose-Capillary 79  70 - 99 mg/dL   CARDIAC PANEL(CRET KIN+CKTOT+MB+TROPI)     Status: Abnormal   Collection Time   08/20/11  4:25 AM      Component Value Range Comment   Total CK 807 (*) 7 - 177 U/L    CK, MB 14.2 (*) 0.3 - 4.0 ng/mL CRITICAL VALUE NOTED.  VALUE IS CONSISTENT WITH PREVIOUSLY REPORTED AND CALLED VALUE.   Troponin I <0.30  <0.30 ng/mL    Relative Index 1.8  0.0 - 2.5   CBC     Status: Abnormal   Collection Time   08/20/11  4:52 AM      Component Value  Range Comment   WBC 11.4 (*) 4.0 - 10.5 K/uL    RBC 2.69 (*) 3.87 - 5.11 MIL/uL    Hemoglobin 8.3 (*) 12.0 - 15.0 g/dL    HCT 16.1 (*) 09.6 - 46.0 %    MCV 100.7 (*) 78.0 - 100.0 fL    MCH 30.9  26.0 - 34.0 pg    MCHC 30.6  30.0 - 36.0 g/dL    RDW 04.5 (*) 40.9 - 15.5 %    Platelets 74 (*) 150 - 400 K/uL CONSISTENT WITH PREVIOUS RESULT  MAGNESIUM     Status: Abnormal   Collection Time   08/20/11  4:52 AM      Component Value Range Comment   Magnesium 1.4 (*) 1.5 - 2.5 mg/dL   PHOSPHORUS     Status: Normal   Collection Time   08/20/11  4:52 AM      Component Value Range Comment   Phosphorus 4.0  2.3 - 4.6 mg/dL   VALPROIC ACID LEVEL     Status: Normal   Collection Time   08/20/11  4:52 AM      Component Value Range Comment   Valproic Acid Lvl 56.0  50.0 - 100.0 ug/mL   COMPREHENSIVE METABOLIC PANEL     Status: Abnormal   Collection Time   08/20/11  4:52 AM      Component Value Range Comment   Sodium 143  135 - 145 mEq/L    Potassium 4.8  3.5 - 5.1 mEq/L    Chloride 105  96 - 112  mEq/L    CO2 23  19 - 32 mEq/L    Glucose, Bld 91  70 - 99 mg/dL    BUN 38 (*) 6 - 23 mg/dL    Creatinine, Ser 8.11 (*) 0.50 - 1.10 mg/dL    Calcium 7.6 (*) 8.4 - 10.5 mg/dL    Total Protein 4.5 (*) 6.0 - 8.3 g/dL    Albumin 2.0 (*) 3.5 - 5.2 g/dL    AST 914 (*) 0 - 37 U/L    ALT 81 (*) 0 - 35 U/L    Alkaline Phosphatase 77  39 - 117 U/L    Total Bilirubin 0.6  0.3 - 1.2 mg/dL    GFR calc non Af Amer 15 (*) >90 mL/min    GFR calc Af Amer 18 (*) >90 mL/min   BLOOD GAS, ARTERIAL     Status: Abnormal   Collection Time   08/20/11  5:16 AM      Component Value Range Comment   FIO2 0.40      Delivery systems VENTILATOR      Mode PRESSURE REGULATED VOLUME CONTROL      VT 400      Rate 15.0      Peep/cpap 5.0      pH, Arterial 7.460 (*) 7.350 - 7.400    pCO2 arterial 34.3 (*) 35.0 - 45.0 mmHg    pO2, Arterial 123.0 (*) 80.0 - 100.0 mmHg    Bicarbonate 24.0  20.0 - 24.0 mEq/L    TCO2 25.1  0 - 100 mmol/L    Acid-Base Excess 0.6  0.0 - 2.0 mmol/L    O2 Saturation 100.0      Patient temperature 98.6      Collection site RIGHT RADIAL      Drawn by [id number]      Sample type ARTERIAL DRAW      Allens test (pass/fail) PASS  PASS   GLUCOSE, CAPILLARY  Status: Normal   Collection Time   08/20/11  7:31 AM      Component Value Range Comment   Glucose-Capillary 78  70 - 99 mg/dL   IRON AND TIBC     Status: Abnormal   Collection Time   08/20/11 11:30 AM      Component Value Range Comment   Iron 25 (*) 42 - 135 ug/dL    TIBC 161 (*) 096 - 045 ug/dL    Saturation Ratios 17 (*) 20 - 55 %    UIBC 126  125 - 400 ug/dL   FERRITIN     Status: Abnormal   Collection Time   08/20/11 11:30 AM      Component Value Range Comment   Ferritin 6627 (*) 10 - 291 ng/mL Result confirmed by automatic dilution.  ALBUMIN     Status: Abnormal   Collection Time   08/20/11 11:30 AM      Component Value Range Comment   Albumin 1.8 (*) 3.5 - 5.2 g/dL   PHENYTOIN LEVEL, TOTAL     Status: Abnormal   Collection  Time   08/20/11 11:30 AM      Component Value Range Comment   Phenytoin Lvl 6.4 (*) 10.0 - 20.0 ug/mL   GLUCOSE, CAPILLARY     Status: Abnormal   Collection Time   08/20/11 11:50 AM      Component Value Range Comment   Glucose-Capillary 102 (*) 70 - 99 mg/dL   GLUCOSE, CAPILLARY     Status: Normal   Collection Time   08/20/11  8:13 PM      Component Value Range Comment   Glucose-Capillary 71  70 - 99 mg/dL   GLUCOSE, CAPILLARY     Status: Normal   Collection Time   08/20/11 11:55 PM      Component Value Range Comment   Glucose-Capillary 81  70 - 99 mg/dL   GLUCOSE, CAPILLARY     Status: Normal   Collection Time   08/21/11  4:12 AM      Component Value Range Comment   Glucose-Capillary 72  70 - 99 mg/dL   CBC     Status: Abnormal   Collection Time   08/21/11  4:39 AM      Component Value Range Comment   WBC 9.1  4.0 - 10.5 K/uL    RBC 2.41 (*) 3.87 - 5.11 MIL/uL    Hemoglobin 7.6 (*) 12.0 - 15.0 g/dL    HCT 40.9 (*) 81.1 - 46.0 %    MCV 99.2  78.0 - 100.0 fL    MCH 31.5  26.0 - 34.0 pg    MCHC 31.8  30.0 - 36.0 g/dL    RDW 91.4 (*) 78.2 - 15.5 %    Platelets 61 (*) 150 - 400 K/uL CONSISTENT WITH PREVIOUS RESULT  COMPREHENSIVE METABOLIC PANEL     Status: Abnormal   Collection Time   08/21/11  4:39 AM      Component Value Range Comment   Sodium 141  135 - 145 mEq/L    Potassium 4.6  3.5 - 5.1 mEq/L    Chloride 106  96 - 112 mEq/L    CO2 23  19 - 32 mEq/L    Glucose, Bld 71  70 - 99 mg/dL    BUN 42 (*) 6 - 23 mg/dL    Creatinine, Ser 9.56 (*) 0.50 - 1.10 mg/dL    Calcium 7.2 (*) 8.4 - 10.5 mg/dL    Total  Protein 4.3 (*) 6.0 - 8.3 g/dL    Albumin 1.7 (*) 3.5 - 5.2 g/dL    AST 161 (*) 0 - 37 U/L    ALT 90 (*) 0 - 35 U/L    Alkaline Phosphatase 64  39 - 117 U/L    Total Bilirubin 0.4  0.3 - 1.2 mg/dL    GFR calc non Af Amer 14 (*) >90 mL/min    GFR calc Af Amer 17 (*) >90 mL/min   AMMONIA     Status: Normal   Collection Time   08/21/11  4:42 AM      Component Value Range  Comment   Ammonia 37  11 - 60 umol/L   GLUCOSE, CAPILLARY     Status: Abnormal   Collection Time   08/21/11  7:48 AM      Component Value Range Comment   Glucose-Capillary 68 (*) 70 - 99 mg/dL   GLUCOSE, CAPILLARY     Status: Normal   Collection Time   08/21/11 11:07 AM      Component Value Range Comment   Glucose-Capillary 70  70 - 99 mg/dL   OCCULT BLOOD X 1 CARD TO LAB, STOOL     Status: Normal   Collection Time   08/21/11 11:13 AM      Component Value Range Comment   Fecal Occult Bld NEGATIVE     CLOSTRIDIUM DIFFICILE BY PCR     Status: Normal   Collection Time   08/21/11 11:14 AM      Component Value Range Comment   C difficile by pcr NEGATIVE  NEGATIVE   GLUCOSE, CAPILLARY     Status: Normal   Collection Time   08/21/11  4:35 PM      Component Value Range Comment   Glucose-Capillary 97  70 - 99 mg/dL     Recent Results (from the past 240 hour(s))  URINE CULTURE     Status: Normal   Collection Time   08/14/11  5:39 PM      Component Value Range Status Comment   Specimen Description URINE, CATHETERIZED   Final    Special Requests NONE   Final    Culture  Setup Time [id number]   Final    Colony Count NO GROWTH   Final    Culture NO GROWTH   Final    Report Status 08/15/2011 FINAL   Final   CULTURE, BLOOD (ROUTINE X 2)     Status: Normal (Preliminary result)   Collection Time   08/19/11  1:15 PM      Component Value Range Status Comment   Specimen Description BLOOD RIGHT HAND   Final    Special Requests BOTTLES DRAWN AEROBIC ONLY 4CC   Final    Culture  Setup Time [id number]   Final    Culture     Final    Value:        BLOOD CULTURE RECEIVED NO GROWTH TO DATE CULTURE WILL BE HELD FOR 5 DAYS BEFORE ISSUING A FINAL NEGATIVE REPORT   Report Status PENDING   Incomplete   CULTURE, BLOOD (ROUTINE X 2)     Status: Normal (Preliminary result)   Collection Time   08/19/11  1:30 PM      Component Value Range Status Comment   Specimen Description BLOOD RIGHT HAND   Final     Special Requests BOTTLES DRAWN AEROBIC ONLY [facility]   Final    Culture  Setup Time [id number]   Final  Culture     Final    Value:        BLOOD CULTURE RECEIVED NO GROWTH TO DATE CULTURE WILL BE HELD FOR 5 DAYS BEFORE ISSUING A FINAL NEGATIVE REPORT   Report Status PENDING   Incomplete   CULTURE, RESPIRATORY     Status: Normal   Collection Time   08/19/11  4:42 PM      Component Value Range Status Comment   Specimen Description TRACHEAL ASPIRATE   Final    Special Requests NONE   Final    Gram Stain     Final    Value: FEW WBC PRESENT,BOTH PMN AND MONONUCLEAR     NO SQUAMOUS EPITHELIAL CELLS SEEN     FEW YEAST   Culture FEW CANDIDA ALBICANS   Final    Report Status 08/21/2011 FINAL   Final   URINE CULTURE     Status: Normal   Collection Time   08/19/11  5:30 PM      Component Value Range Status Comment   Specimen Description URINE, CATHETERIZED   Final    Special Requests Normal   Final    Culture  Setup Time [id number]   Final    Colony Count >=100,000 COLONIES/ML   Final    Culture YEAST   Final    Report Status 08/20/2011 FINAL   Final   CLOSTRIDIUM DIFFICILE BY PCR     Status: Normal   Collection Time   08/21/11 11:14 AM      Component Value Range Status Comment   C difficile by pcr NEGATIVE  NEGATIVE Final     Lipid Panel No results found for this basename: CHOL,TRIG,HDL,CHOLHDL,VLDL,LDLCALC in the last 72 hours  Studies/Results: Dg Chest Port 1 View  08/21/2011  *RADIOLOGY REPORT*  Clinical Data: CHF, intubated  PORTABLE CHEST - 1 VIEW  Comparison: 08/19/2011  Findings: Endotracheal tube tip is 2.6 cm above carina.  Left subclavian central line stable.  Nasogastric tube loops in the stomach, which is partially distended by gas.  Bibasilar airspace opacities, left greater than right, slightly increased since previous exam.  Mild central pulmonary vascular congestion.  IMPRESSION:  1.  Interval advancement of endotracheal tube and placement of nasogastric tube as above. 2.  Slight worsening of bibasilar airspace opacities.  Original Report Authenticated By: Osa Craver, M.D.    Medications:  Scheduled:   . albuterol  2.5 mg Nebulization Q6H   And  . ipratropium  0.5 mg Nebulization Q6H  . antiseptic oral rinse  15 mL Mouth Rinse QID  . chlorhexidine  15 mL Mouth Rinse BID  . dextrose      . famotidine  20 mg Per Tube Daily  . feeding supplement (NEPRO CARB STEADY)  1,000 mL Per Tube Q24H  . fluconazole (DIFLUCAN) IV  200 mg Intravenous Q24H  . hydrocortisone sod succinate (SOLU-CORTEF) injection  50 mg Intravenous Q12H  . modafinil  200 mg Oral Daily  . phenytoin (DILANTIN) IV  100 mg Intravenous Q8H  . valproate sodium  500 mg Intravenous Q12H  . DISCONTD: pantoprazole (PROTONIX) IV  40 mg Intravenous QHS  . DISCONTD: piperacillin-tazobactam (ZOSYN)  IV  2.25 g Intravenous Q8H  . DISCONTD: vancomycin  1,000 mg Intravenous Q48H    Assessment/Plan:  78 y/o with PRES  And seizures, well controlled.  Patient also indicates features of hypertensive encephalopathy  Stable Will add 200 mg of provigil      LOS: 15 days   Pier Bosher  Eilleen Kempf., MD., Ph.D., MS 08/21/2011  5:41 PM

## 2011-08-21 NOTE — Progress Notes (Signed)
Nutrition Follow-up  Patient remains on ventilator.  Trickle TF were initiated yesterday evening--Nepro is currently running at 10 ml/h.  S/P HD Sunday night, but no indication for HD currently per Nephrology note.  UOP is decreasing--280 ml yesterday.  Diet Order:  NPO  TF via OG tube:  Nepro at 10 ml/h providing 432 kcals, 19 grams protein, 174 ml free water daily.  Orders to advance to goal rate of 40 ml/h will provide 1728 kcals, 78 grams protein, 698 ml free water daily.  Meds: Scheduled Meds:   . albuterol  2.5 mg Nebulization Q6H   And  . ipratropium  0.5 mg Nebulization Q6H  . antiseptic oral rinse  15 mL Mouth Rinse QID  . chlorhexidine  15 mL Mouth Rinse BID  . dextrose      . famotidine  20 mg Per Tube Daily  . feeding supplement (NEPRO CARB STEADY)  1,000 mL Per Tube Q24H  . fluconazole (DIFLUCAN) IV  200 mg Intravenous Q24H  . hydrocortisone sod succinate (SOLU-CORTEF) injection  50 mg Intravenous Q12H  . phenytoin (DILANTIN) IV  100 mg Intravenous Q8H  . valproate sodium  500 mg Intravenous Q12H  . DISCONTD: pantoprazole (PROTONIX) IV  40 mg Intravenous QHS  . DISCONTD: piperacillin-tazobactam (ZOSYN)  IV  2.25 g Intravenous Q8H  . DISCONTD: vancomycin  1,000 mg Intravenous Q48H   Continuous Infusions:   . DISCONTD: feeding supplement (NEPRO CARB STEADY)    . DISCONTD: feeding supplement (NEPRO CARB STEADY)     PRN Meds:.sodium chloride, dextrose, fentaNYL, midazolam, DISCONTD: fentaNYL, DISCONTD: midazolam  Labs:  CMP     Component Value Date/Time   NA 141 08/21/2011 0439   K 4.6 08/21/2011 0439   CL 106 08/21/2011 0439   CO2 23 08/21/2011 0439   GLUCOSE 71 08/21/2011 0439   BUN 42* 08/21/2011 0439   CREATININE 2.96* 08/21/2011 0439   CALCIUM 7.2* 08/21/2011 0439   PROT 4.3* 08/21/2011 0439   ALBUMIN 1.7* 08/21/2011 0439   AST 120* 08/21/2011 0439   ALT 90* 08/21/2011 0439   ALKPHOS 64 08/21/2011 0439   BILITOT 0.4 08/21/2011 0439   GFRNONAA 14* 08/21/2011 0439   GFRAA 17* 08/21/2011 0439   CBG (last 3)   Basename 08/21/11 0748 08/21/11 0412 08/20/11 2355  GLUCAP 68* 72 81    Phosphorus  Date/Time Value Range Status  08/20/2011  4:52 AM 4.0  2.3 - 4.6 mg/dL Final  06/03/8117  1:47 PM 3.6  2.3 - 4.6 mg/dL Final  10/24/5619  3:08 AM 3.2  2.3 - 4.6 mg/dL Final   Potassium  Date/Time Value Range Status  08/21/2011  4:39 AM 4.6  3.5 - 5.1 mEq/L Final  08/20/2011  4:52 AM 4.8  3.5 - 5.1 mEq/L Final  08/19/2011  1:54 PM 5.3* 3.5 - 5.1 mEq/L Final    Intake/Output Summary (Last 24 hours) at 08/21/11 1230 Last data filed at 08/21/11 1200  Gross per 24 hour  Intake 1171.5 ml  Output    390 ml  Net  781.5 ml    Weight Status:  68.2 kg (150 lb) up with positive fluid balance  Re-estimated needs:  1350 kcals, 70-80 grams protein daily  Nutrition Dx:  Inadequate oral intake, ongoing.  Goal:  Intake to meet 90-100% of estimated nutrition needs, unmet.  Intervention:  Recommend goal rate for Nepro = 30 ml/h with Pro-stat 30 ml once daily to provide 1396 kcals, 73 grams protein, 523 ml free water daily.  Monitor:  TF tolerance/adequacy, labs, weight trend.   Joaquin Courts, RD, CNSC, LDN Pager# [phone] After Hours Pager# [phone]

## 2011-08-21 NOTE — Progress Notes (Addendum)
[facility]  Subjective:  Intubated, sedated.  Still with upper body tremor Dr. Thad Ranger reported "normal sleep EEG " yesterday   Objective: Vital signs in last 24 hours: Blood pressure 96/44, pulse 100, temperature 99.2 F (37.3 C), temperature source Oral, resp. rate 15, height 5\' 3"  (1.6 m), weight 68.2 kg (150 lb 5.7 oz), SpO2 99.00%.    PHYSICAL EXAM General--intubated, sedated Chest--rhonchi Heart--no rub Abd--nontender Extr--trace edema, AVF patent L upper arm  Date  Weight 06 August 2011 59.8 kg 23  June 62.9 kg 24 June 63.9 kg  25 June  68.5 kg 26 June 68.2 kg  280 cc urine output yesterday  Lab Results:   Lab 08/21/11 0439 08/20/11 0452 08/19/11 1354 08/15/11 0335  NA 141 143 142 --  K 4.6 4.8 5.3* --  CL 106 105 101 --  CO2 23 23 32 --  BUN 42* 38* 37* --  CREATININE 2.96* 2.77* 2.78* --  ALB -- -- -- --  GLUCOSE 71 -- -- --  CALCIUM 7.2* 7.6* 8.5 --  PHOS -- 4.0 3.6 3.2     Basename 08/21/11 0439 08/20/11 0452  WBC 9.1 11.4*  HGB 7.6* 8.3*  HCT 23.9* 27.1*  PLT 61* 74*      Assessment/Plan: 1. ARF on Chronic kidney disease stage IV: Baseline CKD, creatinine 2.96 today , has access in place. ARF due to ATN vs hypoperfusion. Creat had been stable but  UOP decreasing. Dialyzed Sunday night for high K. No indications currently. BP low yesterday--CVP 5 today--given 3 L IV Fluid. Leave foley in today.  2. AMS/Seizure- post ictal/sedated, neuro to see. Curently intubated. Given dilantin and valproate.  Await consult from neurologist  3. Hypertension--BP low currently, CVP 5  4. Anemia- on aranesp, Fe/TIBC 17%, ferritin 6627 on 25 June, hemocult negative. 5. Metabolic bone disease: phos WNL, PTH pending  6. S/p ventral hernia repair with ileus-per CCS  7. Hyperkalemia-K 4.6 today  8. Hypernatremia-Na 141 today.CBC + renal profile tomorrow 9.  VDRF--CXR with worsening bibasilar opacities.  No fever, no purulent sputum. WBC 9100 today.   On zosyn and vanco.    LOS: 15 days   Abdishakur Gottschall F 08/21/2011,9:55 AM   .labalb

## 2011-08-21 NOTE — Progress Notes (Signed)
15 Days Post-Op  Subjective: Eye's remain closed to voice,does not follow commands, less myoclonic jerking noted with stimulus this am. BP improved from yesterday. Remains on vent, trickle TF begun, Flexiseal in place.  Objective: Vital signs in last 24 hours: Temp:  [97.1 F (36.2 C)-99.8 F (37.7 C)] 99.7 F (37.6 C) (06/26 0400) Pulse Rate:  [86-107] 102  (06/26 0600) Resp:  [15-28] 17  (06/26 0600) BP: (84-141)/(33-89) 104/39 mmHg (06/26 0600) SpO2:  [97 %-100 %] 98 % (06/26 0600) FiO2 (%):  [29.7 %-40.2 %] 29.9 % (06/26 0726) Weight:  [150 lb 5.7 oz (68.2 kg)] 150 lb 5.7 oz (68.2 kg) (06/26 0500) Last BM Date: 08/19/11  Intake/Output from previous day: 06/25 0701 - 06/26 0700 In: 947 [I.V.:610; NG/GT:70; IV Piggyback:267] Out: 320 [Urine:280; Stool:40] Intake/Output this shift:    General appearance: no distress and slowed mentation, does not open eyes to voice. Less myoclonic jerking with stimulation this am. Remains on vent. TF have been begun. Abdomen: soft, hypoactive BS, small amount of thin serous appearing drainage seen on dressing covering incision site. Appears to be equivalent to yesterday's drainage. Lab Results:   [facility] 08/21/11 0439 08/20/11 0452  WBC 9.1 11.4*  HGB 7.6* 8.3*  HCT 23.9* 27.1*  PLT 61* 74*   BMET  Basename 08/21/11 0439 08/20/11 0452  NA 141 143  K 4.6 4.8  CL 106 105  CO2 23 23  GLUCOSE 71 91  BUN 42* 38*  CREATININE 2.96* 2.77*  CALCIUM 7.2* 7.6*   PT/INR  Basename 08/19/11 1354  LABPROT 13.9  INR 1.05   ABG  Basename 08/20/11 0516 08/19/11 1448  PHART 7.460* 7.536*  HCO3 24.0 32.0*    Studies/Results: Dg Chest Port 1 View  08/19/2011  *RADIOLOGY REPORT*  Clinical Data: Central line placement.  PORTABLE CHEST - 1 VIEW  Comparison: 08/19/2011  Findings: Left central line is in place with the tip in the SVC. No pneumothorax.  Endotracheal tube tip is 4 cm above the carina. Low lung volumes with bibasilar atelectasis.   Cardiomegaly.  Mild vascular congestion.  IMPRESSION: Left central line tip in the SVC.  No pneumothorax.  Endotracheal tube 4 cm above the carina.  Otherwise no change.  Original Report Authenticated By: Cyndie Chime, M.D.   Dg Chest Port 1 View  08/19/2011  *RADIOLOGY REPORT*  Clinical Data: Respiratory distress.  PORTABLE CHEST - 1 VIEW  Comparison: 08/07/2011  Findings: Low lung volumes with bibasilar atelectasis. Cardiomegaly with vascular congestion.  No acute bony abnormality.  IMPRESSION: Cardiomegaly with vascular congestion.  No overt edema.  Low lung volumes with bibasilar atelectasis.  Original Report Authenticated By: Cyndie Chime, M.D.   Dg Abd 2 Views  08/19/2011  *RADIOLOGY REPORT*  Clinical Data: Abdominal pain.  Abdominal distension.  ABDOMEN - 2 VIEW  Comparison: Multiple priors, most recently 08/11/2011.  Findings: There is some gas and stool scattered throughout the colon.  There are multiple dilated gas-filled loops of small bowel measuring up to approximately 5.6 cm in diameter.  Multiple air fluid levels are noted on the left lateral decubitus view.  No definite pneumoperitoneum is identified. The stomach appears to be massively dilated and filled with gas.  There is a relative paucity of bowel gas in the central abdomen on the supine view.  Surgical clips project over the right upper quadrant of the abdomen, and there are two residual skin staples projecting over the mid abdomen and lower anatomic pelvis.  Other previously noted  skin staples have been removed.  Postoperative changes of right total hip arthroplasty are incompletely visualized.  IMPRESSION: 1.  Nonspecific bowel gas pattern, as above, favored to reflect persistent postoperative ileus.  Partial small bowel obstruction is difficult to entirely excluded, however, and could have a similar appearance. 2.  Increasing gastric distension.  No nasogastric tube is identified. 3.  Postoperative changes, as above.  Original  Report Authenticated By: Florencia Reasons, M.D.    Anti-infectives: Anti-infectives     Start     Dose/Rate Route Frequency Ordered Stop   08/19/11 1400   vancomycin (VANCOCIN) IVPB 1000 mg/200 mL premix        1,000 mg 200 mL/hr over 60 Minutes Intravenous Every 48 hours 08/19/11 1257     08/19/11 1330  piperacillin-tazobactam (ZOSYN) IVPB 2.25 g       2.25 g 100 mL/hr over 30 Minutes Intravenous 3 times per day 08/19/11 1257     08/14/11 1800   cefTRIAXone (ROCEPHIN) 1 g in dextrose 5 % 50 mL IVPB  Status:  Discontinued        1 g 100 mL/hr over 30 Minutes Intravenous Every 24 hours 08/14/11 1706 08/16/11 1354   08/06/11 1400   ceFAZolin (ANCEF) IVPB 1 g/50 mL premix  Status:  Discontinued        1 g 100 mL/hr over 30 Minutes Intravenous 60 min pre-op 08/06/11 1313 08/06/11 1800          Assessment/Plan: s/p Procedure(s) (LRB): HERNIA REPAIR VENTRAL ADULT (N/A) s/p Procedure(s) (LRB):  HERNIA REPAIR VENTRAL ADULT (N/A)- Healing well, patient likely has illeus but no acute surgical abdomen,trickle TF have been started.  Seizure disorder: Neurology following. Mental status remains poor.  Acute Respiratory failure- intubated.  Acute on Chronic renal failure- received HD 6/23 for hyperkalemia, which improved. Renal following.  CV- patient hypotension is improved.Hemoglobin @ 7.6 today, will defer management to CCM.  Patient remains critically ill with multi-organ system failure.     LOS: 15 days    Doneta Bayman 08/21/2011

## 2011-08-22 ENCOUNTER — Inpatient Hospital Stay (HOSPITAL_COMMUNITY): Payer: Medicare Other

## 2011-08-22 DIAGNOSIS — K7290 Hepatic failure, unspecified without coma: Secondary | ICD-10-CM

## 2011-08-22 DIAGNOSIS — G40401 Other generalized epilepsy and epileptic syndromes, not intractable, with status epilepticus: Secondary | ICD-10-CM

## 2011-08-22 DIAGNOSIS — R569 Unspecified convulsions: Secondary | ICD-10-CM

## 2011-08-22 DIAGNOSIS — B3749 Other urogenital candidiasis: Secondary | ICD-10-CM

## 2011-08-22 DIAGNOSIS — Z862 Personal history of diseases of the blood and blood-forming organs and certain disorders involving the immune mechanism: Secondary | ICD-10-CM

## 2011-08-22 LAB — GLUCOSE, CAPILLARY
Glucose-Capillary: 132 mg/dL — ABNORMAL HIGH (ref 70–99)
Glucose-Capillary: 119 mg/dL — ABNORMAL HIGH (ref 70–99)

## 2011-08-22 LAB — CBC
MCHC: 31 g/dL (ref 30.0–36.0)
Platelets: 56 10*3/uL — ABNORMAL LOW (ref 150–400)
RDW: 16.5 % — ABNORMAL HIGH (ref 11.5–15.5)

## 2011-08-22 LAB — BASIC METABOLIC PANEL
BUN: 50 mg/dL — ABNORMAL HIGH (ref 6–23)
Creatinine, Ser: 3.18 mg/dL — ABNORMAL HIGH (ref 0.50–1.10)
GFR calc Af Amer: 15 mL/min — ABNORMAL LOW (ref >90)
GFR calc non Af Amer: 13 mL/min — ABNORMAL LOW (ref >90)
Potassium: 3.8 mEq/L (ref 3.5–5.1)

## 2011-08-22 LAB — VALPROIC ACID LEVEL: Valproic Acid Lvl: 65.4 ug/mL (ref 50.0–100.0)

## 2011-08-22 LAB — PROCALCITONIN: Procalcitonin: 4.42 ng/mL

## 2011-08-22 MED ORDER — METHYLPREDNISOLONE SODIUM SUCC 40 MG IJ SOLR
40.0000 mg | Freq: Two times a day (BID) | INTRAMUSCULAR | Status: DC
  Administered 2011-08-22 – 2011-08-26 (×8): 40 mg via INTRAVENOUS
  Filled 2011-08-22 (×9): qty 1

## 2011-08-22 NOTE — Progress Notes (Signed)
Patient ID: [name], female   DOB: 12-13-1932, 78 y.o.   MRN: [id number] 16 Days Post-Op  Subjective: Eye's opento voice,still does not follow commands, no myoclonic jerking noted with stimulus this am. BP improved from yesterday. Remains on vent, trickle TF begun, Flexiseal in place. Receiving unit of PRBC.  Objective: Vital signs in last 24 hours: Temp:  [97.8 F (36.6 C)-99.3 F (37.4 C)] 99 F (37.2 C) (06/27 0700) Pulse Rate:  [91-105] 98  (06/27 0700) Resp:  [15-21] 20  (06/27 0700) BP: (92-144)/(37-62) 144/62 mmHg (06/27 0700) SpO2:  [97 %-100 %] 100 % (06/27 0600) FiO2 (%):  [29.6 %-30.4 %] 30.3 % (06/27 0600) Weight:  [147 lb 11.3 oz (67 kg)] 147 lb 11.3 oz (67 kg) (06/27 0400) Last BM Date: 08/21/11  Intake/Output from previous day: 06/26 0701 - 06/27 0700 In: 1544 [I.V.:612.5; Blood:12.5; NG/GT:710; IV Piggyback:209] Out: 900 [Urine:385; Stool:515] Intake/Output this shift:    General appearance: no distress and slowed mentation, remains intubated, does open eyes to voice. no myoclonic jerking with stimulation this am. Remains on vent. TF have been begun. Being transfused with 1 unit of PRBC for Hgb of 6.7. Abdomen: soft, hypoactive BS, small amount of thin serous appearing drainage seen on dressing covering incision site. No change from previous assessment. Lab Results:   Basename 08/22/11 0400 08/21/11 0439  WBC 6.4 9.1  HGB 6.7* 7.6*  HCT 21.6* 23.9*  PLT 56* 61*   BMET  Basename 08/22/11 0400 08/21/11 0439  NA 145 141  K 3.8 4.6  CL 110 106  CO2 22 23  GLUCOSE 161* 71  BUN 50* 42*  CREATININE 3.18* 2.96*  CALCIUM 7.3* 7.2*   PT/INR  Basename 08/19/11 1354  LABPROT 13.9  INR 1.05   ABG  Basename 08/20/11 0516 08/19/11 1448  PHART 7.460* 7.536*  HCO3 24.0 32.0*    Studies/Results: Dg Chest Port 1 View  08/21/2011  *RADIOLOGY REPORT*  Clinical Data: CHF, intubated  PORTABLE CHEST - 1 VIEW  Comparison: 08/19/2011  Findings: Endotracheal tube  tip is 2.6 cm above carina.  Left subclavian central line stable.  Nasogastric tube loops in the stomach, which is partially distended by gas.  Bibasilar airspace opacities, left greater than right, slightly increased since previous exam.  Mild central pulmonary vascular congestion.  IMPRESSION:  1.  Interval advancement of endotracheal tube and placement of nasogastric tube as above. 2. Slight worsening of bibasilar airspace opacities.  Original Report Authenticated By: Osa Craver, M.D.    Anti-infectives: Anti-infectives     Start     Dose/Rate Route Frequency Ordered Stop   08/21/11 1200   fluconazole (DIFLUCAN) IVPB 200 mg        200 mg 100 mL/hr over 60 Minutes Intravenous Every 24 hours 08/21/11 1101     08/19/11 1400   vancomycin (VANCOCIN) IVPB 1000 mg/200 mL premix  Status:  Discontinued        1,000 mg 200 mL/hr over 60 Minutes Intravenous Every 48 hours 08/19/11 1257 08/21/11 1054   08/19/11 1330   piperacillin-tazobactam (ZOSYN) IVPB 2.25 g  Status:  Discontinued        2.25 g 100 mL/hr over 30 Minutes Intravenous 3 times per day 08/19/11 1257 08/21/11 1054   08/14/11 1800   cefTRIAXone (ROCEPHIN) 1 g in dextrose 5 % 50 mL IVPB  Status:  Discontinued        1 g 100 mL/hr over 30 Minutes Intravenous Every 24 hours 08/14/11  1706 08/16/11 1354   08/06/11 1400   ceFAZolin (ANCEF) IVPB 1 g/50 mL premix  Status:  Discontinued        1 g 100 mL/hr over 30 Minutes Intravenous 60 min pre-op 08/06/11 1313 08/06/11 1800          Assessment/Plan: s/p Procedure(s) (LRB): HERNIA REPAIR VENTRAL ADULT (N/A) s/p Procedure(s) (LRB):  HERNIA REPAIR VENTRAL ADULT (N/A)- Healing well,  no acute surgical abdomen, TF have been started.  Seizure disorder: Neurology following. Mental status remains poor.  Acute Respiratory failure- intubated.  Acute on Chronic renal failure- received HD 6/23 for hyperkalemia, which improved. Renal following.  CV- patient hypotension is  improved.Hemoglobin @ 7.6 today, will defer management to CCM.  Patient remains critically ill with multi-organ system failure.     LOS: 16 days    DUANNE, JEREMY 08/22/2011  Agree with above.  Primary management per CCM.  Hernia repair stable.  Wilmon Arms. Corliss Skains, MD, [facility]  08/22/2011 1:41 PM

## 2011-08-22 NOTE — Progress Notes (Signed)
[facility] PROGRESS [facility]    SUBJECTIVE   Family states she is more alert today.    OBJECTIVE   Vital signs in last 24 hours: Temp:  [97.8 F (36.6 C)-99.3 F (37.4 C)] 98.9 F (37.2 C) (06/27 0840) Pulse Rate:  [91-105] 98  (06/27 0900) Resp:  [15-23] 20  (06/27 0900) BP: (92-169)/(37-70) 152/63 mmHg (06/27 0900) SpO2:  [97 %-100 %] 99 % (06/27 0900) FiO2 (%):  [29.8 %-30.4 %] 30 % (06/27 0900) Weight:  [67 kg (147 lb 11.3 oz)] 67 kg (147 lb 11.3 oz) (06/27 0400)  Intake/Output from previous day: 06/26 0701 - 06/27 0700 In: 1544 [I.V.:612.5; Blood:12.5; NG/GT:710; IV Piggyback:209] Out: 900 [Urine:385; Stool:515] Intake/Output this shift:   Nutritional status:    Past Medical History  Diagnosis Date  . Hypertension   . Anxiety and depression   . Chronic headaches   . Diverticular disease   . Anemia   . Arthritis     osteoarthritis  . Osteoporosis   . Vitamin B 12 deficiency   . History of ITP   . Anxiety     takes xanax for sleep  . Diabetes mellitus     NIDDM x 12 years  . Heart murmur     benign;asymptomatic  . Chronic kidney disease     esrd    Neurologic Exam:  Mental Status: Intubated, follows very minimal commands (shows me her thumb), opens eyes to external stimuli.   Cranial Nerves: II-No blink to threat III/IV/VI-pupils reactive, positive doll's V/VII-Smile symmetric VIII-winces to pain IX/X-normal gag  Motor: Increased tone throughout, shows tremors in upper extremities when stimulated that will resolve if not stimulated. Mild tremor in left leg.  Mild purposeful movement noted.  Sensory: Pinprick and light touch intact throughout, bilaterally Deep Tendon Reflexes: 2+ and symmetric throughout.     Plantars:      Right:  mute    Left:  Mute  Lab Results: Lab Results  Component Value Date/Time   CHOL  Value: 182        ATP III CLASSIFICATION:  <200     mg/dL   Desirable  161-096  mg/dL    Borderline High  >=045    mg/dL   High        40/10/8117  4:20 AM   Lipid Panel No results found for this basename: CHOL,TRIG,HDL,CHOLHDL,VLDL,LDLCALC in the last 72 hours  Studies/Results: Dg Chest Port 1 View  08/22/2011  *RADIOLOGY REPORT*  Clinical Data: Respiratory failure.  Shortness of breath.  PORTABLE CHEST - 1 VIEW  Comparison: Chest x-ray 08/21/2011.  Findings: An endotracheal tube is in place with tip 3.4 cm above the carina. There is a left-sided subclavian central venous catheter with tip terminating in the proximal superior vena cava. Nasogastric tube is seen coiled in the stomach.  Lung volumes are low.  There are extensive bibasilar opacities which may represent areas of atelectasis and/or consolidation with superimposed small bilateral pleural effusions.  Mild elevation of the left hemidiaphragm is unchanged.  Pulmonary vascular crowding accentuated by low lung volumes, without evidence of pulmonary edema.  Heart size is mildly enlarged (unchanged). The patient is rotated to the right on today's exam, resulting in distortion of the mediastinal contours and reduced diagnostic sensitivity and specificity  for mediastinal pathology.  Atherosclerotic calcifications within the arch of the aorta.  IMPRESSION: 1.  Support apparatus, as above. 2.  Low lung volumes with persistent bibasilar opacities which may represent areas of atelectasis and/or consolidation with superimposed small bilateral pleural effusions. 3.  Mild cardiomegaly. 4.  Atherosclerosis.  Original Report Authenticated By: Florencia Reasons, M.D.   Dg Chest Port 1 View  08/21/2011  *RADIOLOGY REPORT*  Clinical Data: CHF, intubated  PORTABLE CHEST - 1 VIEW  Comparison: 08/19/2011  Findings: Endotracheal tube tip is 2.6 cm above carina.  Left subclavian central line stable.  Nasogastric tube loops in the stomach, which is partially distended by gas.  Bibasilar airspace opacities, left greater than right, slightly increased since  previous exam.  Mild central pulmonary vascular congestion.  IMPRESSION:  1.  Interval advancement of endotracheal tube and placement of nasogastric tube as above. 2. Slight worsening of bibasilar airspace opacities.  Original Report Authenticated By: Osa Craver, M.D.    Medications:     Scheduled:   . albuterol  2.5 mg Nebulization Q6H   And  . ipratropium  0.5 mg Nebulization Q6H  . antiseptic oral rinse  15 mL Mouth Rinse QID  . chlorhexidine  15 mL Mouth Rinse BID  . famotidine  20 mg Per Tube Daily  . feeding supplement (NEPRO CARB STEADY)  1,000 mL Per Tube Q24H  . fluconazole (DIFLUCAN) IV  200 mg Intravenous Q24H  . hydrocortisone sod succinate (SOLU-CORTEF) injection  50 mg Intravenous Q12H  . modafinil  200 mg Oral Daily  . phenytoin (DILANTIN) IV  100 mg Intravenous Q8H  . valproate sodium  500 mg Intravenous Q12H  . DISCONTD: pantoprazole (PROTONIX) IV  40 mg Intravenous QHS  . DISCONTD: piperacillin-tazobactam (ZOSYN)  IV  2.25 g Intravenous Q8H  . DISCONTD: vancomycin  1,000 mg Intravenous Q48H    Assessment/Plan:   78 y/o with PRES And seizures, well controlled. Patient also indicates features of hypertensive encephalopathy  Stable   Recommend: 1) Continue 200 mg of provigil currently scheduled. 2) Pharmacy following Dilantin and Depakote 3) Continue BP management and underlying metabolic issues.    I have discussed patient with Dr. Luci Bank and he has seen the patient and agrees with the above mentioned.   Felicie Morn PA-C [facility] [phone]  08/22/2011, 10:33 AM

## 2011-08-22 NOTE — Procedures (Signed)
Extubation Procedure Note  Patient Details:   Name: [name] DOB: May 03, 1932 MRN: [id number]   Airway Documentation:     Evaluation  O2 sats: stable throughout Complications: No apparent complications Patient did tolerate procedure well. Bilateral Breath Sounds: Clear;Diminished Suctioning: Airway Yes pt able to speak.  HR 104, RR 28, Sp02 100% on 4LPM [place], BBS Clear Diminshed in bases, no stridor noted at this time. No cuff leak noted MD aware.   RT will continue to follow.   Levada Schilling 08/22/2011, 11:26 AM

## 2011-08-22 NOTE — Progress Notes (Signed)
Pt profile: 79 yowf with PMH of DM, CKD, Htn, ITP (chronic low dose pred) and one prior seizure with negative EEG. Underwent ventral hernia repair 6/11. Suffered tonic-clonic seizure 6/17. Required urgent HD 6/22 for hyperkalemia. Suffered recurrent tonic clonic seizures 6/24 requiring 4 mg Ativan IV. Intubated for depressed LOC. PCCM assumed care 6/24  Active problems: Principal Problem:  *Acute respiratory failure Active Problems:  Acute on chronic renal insufficiency  Seizure, convulsive  Encephalopathy acute  Status post laparotomy  Thrombocytopenia  Anemia of renal disease  Recurrent incisional hernia with incarceration  Ileus following gastrointestinal surgery  Candiduria  Hyperkalemia  Hypotension  History of ITP   Subj: RASS -1. +t F/C. Passed SBT. Extubated and looks OK but cough mechanics are poor  Obj: Filed Vitals:   08/22/11 1430  BP: 161/71  Pulse: 100  Temp: 99.3 F (37.4 C)  Resp: 25    Gen: chronically ill appearing, NAD HEENT: WNL Neck: JVP not visualized, no LAN Chest: slightly coarse B BS Cardiac: RRR s M Abd: Soft, diminished BS, ecchymosis over RLQ to R flank - NSC Ext: trace symmetric LE edema    CXR: NSC low volumes with bilateral basilar atelectasis  BMET    Component Value Date/Time   NA 145 08/22/2011 0400   K 3.8 08/22/2011 0400   CL 110 08/22/2011 0400   CO2 22 08/22/2011 0400   GLUCOSE 161* 08/22/2011 0400   BUN 50* 08/22/2011 0400   CREATININE 3.18* 08/22/2011 0400   CALCIUM 7.3* 08/22/2011 0400   GFRNONAA 13* 08/22/2011 0400   GFRAA 15* 08/22/2011 0400    Lab Results  Component Value Date   WBC 6.4 08/22/2011   HGB 6.7* 08/22/2011   HCT 21.6* 08/22/2011   MCV 100.0 08/22/2011   PLT 56* 08/22/2011       IMPRESSION/PLAN:   Acute respiratory failure due to decreased LOC post seizure - Extubated 6/27. Post extubation care implemented   Hypotension - resolved   Seizure, convulsive  Encephalopathy acute - Anticonvulsants per  Neuro   Acute on chronic renal insufficiency  Hyperkalemia - resolved - Monitor daily BMET - Monitor I/Os   Anemia of renal disease  Thrombocytopenia, h/o ITP on baseline dose of Pred - No indication for RBCs or platelets presently - LMWH stopped 6/24 - SCDs for DVT prophy - Change hydrocortisone to solumedrol    Recurrent incisional hernia with incarceration  Ileus following gastrointestinal surgery - resolved  Status post laparotomy  Diarrhea - Post op mgmt per CCS - NPO after extubation. Consider diet 6/28 - Stool for C diff NEG - Hemoccult NEG  Candiduria  - Fluconazole ordered 6/26 > cont   Family updated @ bedside  CCM 35 mins  Billy Fischer, MD ; [facility] [phone].  After 5:30 PM or weekends, call [phone]

## 2011-08-22 NOTE — Progress Notes (Signed)
MEDICATION RELATED CONSULT NOTE - FOLLOW UP  Pharmacy Consult:  Dilantin/Valproate Indication: Seizure-like activity  No Known Allergies  Patient Measurements: Height: 5\' 3"  (160 cm) Weight: 147 lb 11.3 oz (67 kg) IBW/kg (Calculated) : 52.4   Vital Signs: Temp: 98.9 F (37.2 C) (06/27 0840) Temp src: Oral (06/27 0748) BP: 152/63 mmHg (06/27 0900) Pulse Rate: 98  (06/27 0900) Intake/Output from previous day: 06/26 0701 - 06/27 0700 In: 1544 [I.V.:612.5; Blood:12.5; NG/GT:710; IV Piggyback:209] Out: 900 [Urine:385; Stool:515]  Labs:  [facility] 08/22/11 0400 08/21/11 0439 08/20/11 1130 08/20/11 0452 08/19/11 1354  WBC 6.4 9.1 -- 11.4* --  HGB 6.7* 7.6* -- 8.3* --  HCT 21.6* 23.9* -- 27.1* --  PLT 56* 61* -- 74* --  APTT -- -- -- -- 28  CREATININE 3.18* 2.96* -- 2.77* --  LABCREA -- -- -- -- --  CREATININE 3.18* 2.96* -- 2.77* --  CREAT24HRUR -- -- -- -- --  MG -- -- -- 1.4* 1.7  PHOS -- -- -- 4.0 3.6  ALBUMIN 1.5* 1.7* 1.8* -- --  PROT -- 4.3* -- 4.5* 4.8*  ALBUMIN 1.5* 1.7* 1.8* -- --  AST -- 120* -- 150* 38*  ALT -- 90* -- 81* 30  ALKPHOS -- 64 -- 77 64  BILITOT -- 0.4 -- 0.6 0.6  BILIDIR -- -- -- -- --  IBILI -- -- -- -- --   Estimated Creatinine Clearance: 13.6 ml/min (by C-G formula based on Cr of 3.18).   Assessment: Was asked by neurology PA, Felicie Morn to follow seizure medications along with them for potential LFT interactions since these are metabolized via glucuronide conjugation and oxidation.  This 47 YOF with EEG and MRI suggestive of encephalopathy s/p elevated ventral hernia repair on 08/06/11 followed by sudden onset unresponsiveness and seizure like action on 08/12/11. Patient on Keppra PTA for similar episode in 2010. Keppra discontinued and IV Phenytoin started after her seizure activity on 6/17.  Neurology added Valproate with a 1gm load on 6/24 and maintenance of 500mg  twice daily.    6/19: DPH 13.6 post-load, corrects up to 20 6/24: DPH 10,  albumin 2.7, corrected 15.6 6/25:  Valproic Acid Lvl 56.0 ug/ml which is lower end of goal 6/27: VPA 65.4 (goal 50-100); DPH 8.1 (corrects to 20 w/ renal/ low albumin) >> Mental status improved.   LFTs: AST trending down. ALT stable. Tbili 0.4.    Goal of Therapy:  Phenytoin level 10-20 mcg/mL Valproate 50-100 mcg/ml  Plan:  - Continue Dilantin 100mg  IV Q8H - Continue Valproate 500mg  IV Q12H - Monitor LFT's - Will check Dilantin and Valproate acid levels again next week.  Link Snuffer, PharmD, BCPS Clinical Pharmacist [phone]  Thank you for allowing pharmacy to be part of this patients care team. 08/22/2011, 11:07 AM

## 2011-08-22 NOTE — Progress Notes (Signed)
Name: [name] MRN: [id number] DOB: Feb 24, 1933  ELECTRONIC ICU PHYSICIAN NOTE  Problem:    Lab 08/22/11 0400 08/21/11 0439 08/20/11 0452  HGB 6.7* 7.6* 8.3*     Intervention:  Transfuse x one unit prbc's for critical illness anemia  Sandrea Hughs 08/22/2011, 5:08 AM

## 2011-08-22 NOTE — Progress Notes (Signed)
[facility]  Subjective:  Awake, intubated, family at bedside; she's following commands of neurologist   Objective: Vital signs in last 24 hours: Blood pressure 152/63, pulse 98, temperature 98.9 F (37.2 C), temperature source Oral, resp. rate 20, height 5\' 3"  (1.6 m), weight 67 kg (147 lb 11.3 oz), SpO2 99.00%.    PHYSICAL EXAM General--as above Chest--3 lumen cath R IJ, rhonchi Heart--no rub Abd--nontender Extr--upper extr edema, AVF patent L upper arm  Lab Results:   Lab 08/22/11 0400 08/21/11 0439 08/20/11 0452 08/19/11 1354  NA 145 141 143 --  K 3.8 4.6 4.8 --  CL 110 106 105 --  CO2 22 23 23  --  BUN 50* 42* 38* --  CREATININE 3.18* 2.96* 2.77* --  ALB -- -- -- --  GLUCOSE 161* -- -- --  CALCIUM 7.3* 7.2* 7.6* --  PHOS -- -- 4.0 3.6     Basename 08/22/11 0400 08/21/11 0439  WBC 6.4 9.1  HGB 6.7* 7.6*  HCT 21.6* 23.9*  PLT 56* 61*     Assessment/Plan: 1. ARF on Chronic kidney disease stage IV: Baseline CKD, creatinine 3.18 today , has access in place. ARF due to ATN vs hypoperfusion. Creat had been stable but UOP decreasing. Dialyzed Sunday night for high K. No indications currently. BP low yesterday--CVP 5 today--given 3 L IV Fluid. Leave foley in today.  2. AMS/Seizure- post ictal/sedated, neuro to see. Curently intubated. Given dilantin and valproate. Neurologist has added provigil 3. Hypertension--BP ok currently, CVP 5  4. Anemia- on aranesp, Fe/TIBC 17%, ferritin 6627 on 25 June, hemocult negative. Needs PRBC 5. Metabolic bone disease: phos WNL, PTH pending  6. S/p ventral hernia repair with ileus-per CCS  7. Hyperkalemia-K 3.8 today  8. Hypernatremia-Na 145 today. CBC + renal profile tomorrow  9. VDRF--CXR with worsening bibasilar opacities. No fever, no purulent sputum. WBC 9100 today. On zosyn and vanco.     LOS: 16 days   Guerino Caporale F 08/22/2011,10:44 AM   .labalb

## 2011-08-23 ENCOUNTER — Inpatient Hospital Stay (HOSPITAL_COMMUNITY): Payer: Medicare Other

## 2011-08-23 DIAGNOSIS — G40401 Other generalized epilepsy and epileptic syndromes, not intractable, with status epilepticus: Secondary | ICD-10-CM

## 2011-08-23 DIAGNOSIS — K7290 Hepatic failure, unspecified without coma: Secondary | ICD-10-CM

## 2011-08-23 DIAGNOSIS — R569 Unspecified convulsions: Secondary | ICD-10-CM

## 2011-08-23 LAB — BASIC METABOLIC PANEL
BUN: 60 mg/dL — ABNORMAL HIGH (ref 6–23)
Calcium: 7.7 mg/dL — ABNORMAL LOW (ref 8.4–10.5)
GFR calc non Af Amer: 14 mL/min — ABNORMAL LOW (ref >90)
Glucose, Bld: 122 mg/dL — ABNORMAL HIGH (ref 70–99)
Sodium: 146 mEq/L — ABNORMAL HIGH (ref 135–145)

## 2011-08-23 LAB — GLUCOSE, CAPILLARY
Glucose-Capillary: 120 mg/dL — ABNORMAL HIGH (ref 70–99)
Glucose-Capillary: 120 mg/dL — ABNORMAL HIGH (ref 70–99)
Glucose-Capillary: 167 mg/dL — ABNORMAL HIGH (ref 70–99)

## 2011-08-23 LAB — CBC
MCH: 30.2 pg (ref 26.0–34.0)
MCHC: 32.5 g/dL (ref 30.0–36.0)
Platelets: 60 10*3/uL — ABNORMAL LOW (ref 150–400)

## 2011-08-23 MED ORDER — POTASSIUM CHLORIDE 10 MEQ/100ML IV SOLN
10.0000 meq | INTRAVENOUS | Status: AC
Start: 2011-08-23 — End: 2011-08-23
  Administered 2011-08-23 (×6): 10 meq via INTRAVENOUS
  Filled 2011-08-23: qty 200
  Filled 2011-08-23: qty 400

## 2011-08-23 MED ORDER — MODAFINIL 200 MG PO TABS
200.0000 mg | ORAL_TABLET | Freq: Every day | ORAL | Status: DC
  Administered 2011-08-23 – 2011-08-26 (×4): 200 mg
  Filled 2011-08-23 (×6): qty 1

## 2011-08-23 MED ORDER — FREE WATER
200.0000 mL | Freq: Three times a day (TID) | Status: DC
  Administered 2011-08-23 – 2011-08-25 (×6): 200 mL

## 2011-08-23 MED ORDER — MODAFINIL 200 MG PO TABS: 200.0000 mg | ORAL_TABLET | Freq: Every day | ORAL | Status: DC

## 2011-08-23 MED ORDER — FUROSEMIDE 10 MG/ML IJ SOLN
100.0000 mg | Freq: Four times a day (QID) | INTRAVENOUS | Status: DC
  Administered 2011-08-23 – 2011-08-24 (×4): 100 mg via INTRAVENOUS
  Filled 2011-08-23 (×7): qty 10

## 2011-08-23 MED ORDER — FLUCONAZOLE 40 MG/ML PO SUSR
200.0000 mg | Freq: Every day | ORAL | Status: AC
  Administered 2011-08-23 – 2011-08-25 (×3): 200 mg
  Filled 2011-08-23 (×3): qty 5

## 2011-08-23 MED ORDER — METHYLPHENIDATE HCL 5 MG PO TABS: 5.0000 mg | ORAL_TABLET | Freq: Once | ORAL | Status: DC

## 2011-08-23 NOTE — Progress Notes (Signed)
Pt cannot maintain arousal for Swallow eval. RN reports pt may get some meds to help with alertness. Will reattempt eval in later pm. Thanks, Harlon Ditty, MA CCC-SLP [phone]

## 2011-08-23 NOTE — Progress Notes (Signed)
[facility]  Subjective:  Sitting on side of bed.  PT trying to get her to cooperate.  She's lethargic.  Daughters at bedside   Objective: Vital signs in last 24 hours: Blood pressure 158/68, pulse 95, temperature 98.1 F (36.7 C), temperature source Oral, resp. rate 21, height 5\' 3"  (1.6 m), weight 68.5 kg (151 lb 0.2 oz), SpO2 96.00%. PHYSICAL EXAM General--lethargic, not cooperating with PT Chest--rhonchi Heart--no rub Abd--nontender, ++sacral edema Extr--marked edema of UE, not so much in LE  Lab Results:   Lab 08/23/11 0430 08/22/11 0400 08/21/11 0439 08/20/11 0452 08/19/11 1354  NA 146* 145 141 -- --  K 3.4* 3.8 4.6 -- --  CL 109 110 106 -- --  CO2 20 22 23  -- --  BUN 60* 50* 42* -- --  CREATININE 3.05* 3.18* 2.96* -- --  ALB -- -- -- -- --  GLUCOSE 122* -- -- -- --  CALCIUM 7.7* 7.3* 7.2* -- --  PHOS -- -- -- 4.0 3.6     Basename 08/23/11 0430 08/22/11 0400  WBC 5.7 6.4  HGB 11.7* 6.7*  HCT 36.0 21.6*  PLT 60* 56*   Assessment/Plan: 1. ARF on Chronic kidney disease stage IV: Baseline CKD, creatinine 3.05 today , has access in place. ARF due to ATN vs hypoperfusion. Creat had been stable but UOP decreasing. Dialyzed Sunday night for high K. On no diuretics now.  She appears wet.  Will check CXR, restart diuretics (furosemide 100 IV Q6 hr).  If no change then will begin HD 2. AMS/Seizure- post ictal/sedated, neuro to see. Curently extubated. Given dilantin, provigil, and valproate.  Neuro is following  3. Hypertension--BP 158/68 4. Anemia- on aranesp, Fe/TIBC 17%, ferritin 6627 on 25 June, hemocult negative.  5. Metabolic bone disease: phos WNL, PTH ordered today 6. S/p ventral hernia repair with ileus-per CCS  7. Hyperkalemia-K 3.4 today.  Rx IV KCl  8. Hypernatremia-Na 146 today.CBC + renal profile tomorrow  9. VDRF--CXR with worsening bibasilar opacities. No fever, no purulent sputum. WBC 9100 today. On zosyn and vanco. Repeat CXR today    LOS: 17 days   Cheryel Kyte F 08/23/2011,10:01 AM   .labalb

## 2011-08-23 NOTE — Evaluation (Signed)
Physical Therapy Evaluation Patient Details Name: [name] MRN: [id number] DOB: 1932/06/15 Today's Date: 08/23/2011 Time: 1478-2956 PT Time Calculation (min): 22 min  PT Assessment / Plan / Recommendation Clinical Impression  Pt admitted for ventral hernia repair with complications of 2 seizures, urgent HD 6/22, and VDRF 6/24-6/27, as well as encephalopathy and prolonged bed rest. Family desire for pt to return home but educated for potential of long rehab course due to immobility and medical complications and at this point recommend additional rehab before return home and family seem at least open to this need. Pt will benefit from acute therapy to maximize mobility, strength, transfers and mobility to decrease burden of care and increase function prior to transfer to next venue of care.     PT Assessment  Patient needs continued PT services    Follow Up Recommendations  Inpatient Rehab    Barriers to Discharge None      Equipment Recommendations  Defer to next venue    Recommendations for Other Services OT consult;Rehab consult   Frequency Min 3X/week    Precautions / Restrictions Precautions Precautions: Fall   Pertinent Vitals/Pain No pain      Mobility  Bed Mobility Bed Mobility: Supine to Sit;Sit to Supine;Scooting to [facility];Sitting - Scoot to Edge of Bed Supine to Sit: 1: +2 Total assist;HOB elevated (HOB 20degrees) Supine to Sit: Patient Percentage: 0% Sitting - Scoot to Edge of Bed: 1: +2 Total assist Sit to Supine: HOB flat;1: +2 Total assist Sit to Supine: Patient Percentage: 0% Scooting to HOB: 1: +2 Total assist Scooting to [facility]: Patient Percentage: 0% Details for Bed Mobility Assistance: Pt initiated shoulder movement for supine to sit but unable to complete movement of any extremity or trunk for mobility and use of pad to pivot trunk and sacrum and elevate from bed Transfers Transfers: Not assessed Ambulation/Gait Ambulation/Gait Assistance: Not tested  (comment)    Exercises     PT Diagnosis: Generalized weakness;Altered mental status  PT Problem List: Decreased strength;Decreased mobility;Decreased coordination;Decreased cognition;Decreased activity tolerance;Decreased balance;Decreased knowledge of use of DME PT Treatment Interventions: DME instruction;Functional mobility training;Therapeutic activities;Cognitive remediation;Neuromuscular re-education;Balance training;Therapeutic exercise;Patient/family education   PT Goals Acute Rehab PT Goals PT Goal Formulation: With patient/family Time For Goal Achievement: 09/06/11 Potential to Achieve Goals: Fair Pt will go Supine/Side to Sit: with max assist PT Goal: Supine/Side to Sit - Progress: Goal set today Pt will Sit at Edge of Bed: with mod assist;1-2 min;with bilateral upper extremity support PT Goal: Sit at Edge Of Bed - Progress: Goal set today Pt will go Sit to Supine/Side: with max assist PT Goal: Sit to Supine/Side - Progress: Goal set today Pt will go Sit to Stand: with +2 total assist;Other (comment) (pt=40%) PT Goal: Sit to Stand - Progress: Goal set today Pt will go Stand to Sit: with +2 total assist;Other (comment) (pt=40%) PT Goal: Stand to Sit - Progress: Goal set today Pt will Transfer Bed to Chair/Chair to Bed: with +2 total assist;Other (comment) (pt=40%) PT Transfer Goal: Bed to Chair/Chair to Bed - Progress: Goal set today  Visit Information  Last PT Received On: 08/23/11 Assistance Needed: +2    Subjective Data  Subjective: "they look like twins to me"- referring to dgtrs in room Patient Stated Goal: dgtrs state for pt to return home   Prior Functioning  Home Living Lives With: Son Available Help at Discharge: Family;Available 24 hours/day Type of Home: House Home Access: Ramped entrance Home Layout: One level Bathroom Shower/Tub: Tub/shower  unit Bathroom Toilet: Standard Bathroom Accessibility: No Home Adaptive Equipment: Bedside commode/3-in-1;Tub  transfer bench;Walker - rolling;Straight cane Prior Function Level of Independence: Independent Able to Take Stairs?: No Driving: No Vocation: Retired Musician: [employer] Dominant Hand: Right    Cognition  Overall Cognitive Status: Impaired Area of Impairment: Attention;Following commands Arousal/Alertness: Lethargic Behavior During Session: Lethargic Current Attention Level: Focused Cognition - Other Comments: Pt unable to maintain arousal throughout and when aroused only able to focus for seconds at a time before drifting back to sleep, no change with supine to sit, periods throughout session of momentary arousal  and unable to determine orientation    Extremity/Trunk Assessment Right Upper Extremity Assessment RUE ROM/Strength/Tone: Deficits;Unable to fully assess;Due to impaired cognition RUE ROM/Strength/Tone Deficits: pt with min 2-/5 motion noted with AAROM, unable to fully assess due to arousal Left Upper Extremity Assessment LUE ROM/Strength/Tone: Deficits;Unable to fully assess;Due to impaired cognition Right Lower Extremity Assessment RLE ROM/Strength/Tone: Deficits;Unable to fully assess;Due to impaired cognition Left Lower Extremity Assessment LLE ROM/Strength/Tone: Deficits;Unable to fully assess;Due to impaired cognition   Balance Static Sitting Balance Static Sitting - Balance Support: Bilateral upper extremity supported;Feet supported Static Sitting - Level of Assistance: 1: +1 Total assist Static Sitting - Comment/# of Minutes: pt with flexed neck and trunk throughout and unable to follow commands to push with bil UE to support weight in sitting, grossly 5 min EOB without change in arousal  End of Session PT - End of Session Activity Tolerance: Other (comment) (pt limited by lethargy) Patient left: in bed;with call bell/phone within reach;with family/visitor present Nurse Communication: Mobility status  GP     Delorse Lek 08/23/2011, 10:26  AM  [name], PT [phone]

## 2011-08-23 NOTE — Progress Notes (Signed)
Pt still very lethargic, not ready for POs, has Panda. Will recheck tomorrow. Harlon Ditty, MA CCC-SLP [phone]

## 2011-08-23 NOTE — Progress Notes (Signed)
MEDICATION RELATED CONSULT NOTE - FOLLOW UP  Pharmacy Consult:  Dilantin/Valproate Indication: Seizure-like activity  No Known Allergies  Patient Measurements: Height: 5\' 3"  (160 cm) Weight: 151 lb 0.2 oz (68.5 kg) IBW/kg (Calculated) : 52.4   Vital Signs: Temp: 98.1 F (36.7 C) (06/28 0800) Temp src: Oral (06/28 0800) BP: 158/68 mmHg (06/28 0900) Pulse Rate: 95  (06/28 0925) Intake/Output from previous day: 06/27 0701 - 06/28 0700 In: 1171.5 [I.V.:500; Blood:337.5; NG/GT:120; IV Piggyback:214] Out: 505 [Urine:505]  Labs:  [facility] 08/23/11 0430 08/22/11 0400 08/21/11 0439 08/20/11 1130  WBC 5.7 6.4 9.1 --  HGB 11.7* 6.7* 7.6* --  HCT 36.0 21.6* 23.9* --  PLT 60* 56* 61* --  APTT -- -- -- --  CREATININE 3.05* 3.18* 2.96* --  LABCREA -- -- -- --  CREATININE 3.05* 3.18* 2.96* --  CREAT24HRUR -- -- -- --  MG -- -- -- --  PHOS -- -- -- --  ALBUMIN -- 1.5* 1.7* 1.8*  PROT -- -- 4.3* --  ALBUMIN -- 1.5* 1.7* 1.8*  AST -- -- 120* --  ALT -- -- 90* --  ALKPHOS -- -- 64 --  BILITOT -- -- 0.4 --  BILIDIR -- -- -- --  IBILI -- -- -- --   Estimated Creatinine Clearance: 14.3 ml/min (by C-G formula based on Cr of 3.05).   Assessment: Was asked by neurology PA, Felicie Morn to follow seizure medications along with them for potential LFT interactions since these are metabolized via glucuronide conjugation and oxidation.  This 74 YOF with EEG and MRI suggestive of encephalopathy s/p elevated ventral hernia repair on 08/06/11 followed by sudden onset unresponsiveness and seizure like action on 08/12/11. Patient on Keppra PTA for similar episode in 2010. Keppra discontinued and IV Phenytoin started after her seizure activity on 6/17.  Neurology added Valproate with a 1gm load on 6/24 and maintenance of 500mg  twice daily.    6/19: DPH 13.6 post-load, corrects up to 20 6/24: DPH 10, albumin 2.7, corrected 15.6 6/25:  Valproic Acid Lvl 56.0 ug/ml which is lower end of goal 6/27: VPA  65.4 (goal 50-100); DPH 8.1 (corrects to 20 w/ renal/ low albumin) >> Mental status improved.  6/28: DPH 8.6 (corrects to 20 with low albumin and renal function)-Checked per Neurology.   6/26 LFTs: AST trending down. ALT stable. Tbili 0.4.    Goal of Therapy:  Phenytoin level 10-20 mcg/mL Valproate 50-100 mcg/ml  Plan:  - Continue Dilantin 100mg  IV Q8H - Continue Valproate 500mg  IV Q12H - Monitor LFT's  Link Snuffer, PharmD, BCPS Clinical Pharmacist [phone]  Thank you for allowing pharmacy to be part of this patients care team. 08/23/2011, 11:19 AM

## 2011-08-23 NOTE — Progress Notes (Signed)
Pt profile: 79 yowf with PMH of DM, CKD, Htn, ITP (chronic low dose pred) and one prior seizure with negative EEG. Underwent ventral hernia repair 6/11. Suffered tonic-clonic seizure 6/17. Required urgent HD 6/22 for hyperkalemia. Suffered recurrent tonic clonic seizures 6/24 requiring 4 mg Ativan IV. Intubated for depressed LOC. PCCM assumed care 6/24  Active problems: Principal Problem:  *Acute respiratory failure Active Problems:  Acute on chronic renal insufficiency  Seizure, convulsive  Encephalopathy acute  Status post laparotomy  Thrombocytopenia  Anemia of renal disease  Recurrent incisional hernia with incarceration  Ileus following gastrointestinal surgery  Candiduria  Hyperkalemia  Hypotension  History of ITP   Subj: Has tolerated extubation. Profoundly weak. + F/C. No distress. Too lethargic for swallow eval  Obj: Filed Vitals:   08/23/11 1600  BP: 146/68  Pulse: 101  Temp: 98.2 F (36.8 C)  Resp: 22    Gen: chronically ill appearing, NAD HEENT: WNL Neck: JVP not visualized, no LAN Chest: slightly coarse B BS Cardiac: RRR s M Abd: Soft, diminished BS, ecchymosis over RLQ to R flank - NSC Ext: trace symmetric LE edema    CXR: NSC low volumes with bilateral basilar atelectasis  BMET    Component Value Date/Time   NA 146* 08/23/2011 0430   K 3.4* 08/23/2011 0430   CL 109 08/23/2011 0430   CO2 20 08/23/2011 0430   GLUCOSE 122* 08/23/2011 0430   BUN 60* 08/23/2011 0430   CREATININE 3.05* 08/23/2011 0430   CALCIUM 7.7* 08/23/2011 0430   GFRNONAA 14* 08/23/2011 0430   GFRAA 16* 08/23/2011 0430    Lab Results  Component Value Date   WBC 5.7 08/23/2011   HGB 11.7* 08/23/2011   HCT 36.0 08/23/2011   MCV 93.0 08/23/2011   PLT 60* 08/23/2011       IMPRESSION/PLAN:   Acute respiratory failure due to decreased LOC post seizure - Extubated 6/27. Post extubation care implemented   Hypotension - resolved   PRES (per MRI brain 6/18)  Seizure, convulsive  Encephalopathy acute - Anticonvulsants per Neuro   Acute on chronic renal insufficiency  Hyperkalemia - resolved - Monitor daily BMET - Monitor I/Os   Anemia of renal disease  Thrombocytopenia, h/o ITP on baseline dose of Pred - No indication for RBCs or platelets presently - LMWH stopped 6/24 - SCDs for DVT prophy - Changed hydrocortisone to solumedrol 6/27    Recurrent incisional hernia with incarceration  Ileus following gastrointestinal surgery - resolved  Status post laparotomy  Diarrhea - Post op mgmt per CCS - NPO after extubation. Panda tube placed 6/28 - TFs ordered - Stool for C diff NEG - Hemoccult NEG  Candiduria  - Fluconazole ordered 6/26 > 7/1 (planned)    Needs to stay in ICU as she is at high risk of re-intubation due to depressed LOC and generalized weakness   Billy Fischer, MD ; [facility] service Mobile [phone].  After 5:30 PM or weekends, call [phone]

## 2011-08-23 NOTE — Progress Notes (Signed)
Patient ID: [name], female   DOB: 08-20-1932, 78 y.o.   MRN: [id number] Patient ID: [name], female   DOB: 27-Jan-1933, 78 y.o.   MRN: [id number] 17 Days Post-Op  Subjective: Eye's open to voice/stimulation , does follow some commands,remins hypersomulent, no myoclonic jerking noted. BP improved from yesterday. Patient has been extubated.TF have been stopped, Flexiseal in place. HGB improved from yesterday. Upper extremities very edematous.  Objective: Vital signs in last 24 hours: Temp:  [98.1 F (36.7 C)-100.6 F (38.1 C)] 98.1 F (36.7 C) (06/28 0422) Pulse Rate:  [91-105] 91  (06/28 0600) Resp:  [19-27] 19  (06/28 0600) BP: (114-169)/(47-73) 114/47 mmHg (06/28 0600) SpO2:  [93 %-100 %] 93 % (06/28 0600) FiO2 (%):  [29.8 %-30 %] 29.8 % (06/27 1100) Weight:  [151 lb 0.2 oz (68.5 kg)] 151 lb 0.2 oz (68.5 kg) (06/28 0400) Last BM Date: 08/22/11  Intake/Output from previous day: 06/27 0701 - 06/28 0700 In: 1151.5 [I.V.:480; Blood:337.5; NG/GT:120; IV Piggyback:214] Out: 495 [Urine:495] Intake/Output this shift:    General appearance: no distress and slowed mentation, extubated on Room air sats 94-95%, does open eyes to voice. no myoclonic jerking with stimulation this am.  Abdomen: soft, hypoactive BS, small amount of old appearing drainage seen on dressing covering incision site. No change from previous assessment. Lab Results:   Basename 08/23/11 0430 08/22/11 0400  WBC 5.7 6.4  HGB 11.7* 6.7*  HCT 36.0 21.6*  PLT 60* 56*   BMET  Basename 08/23/11 0430 08/22/11 0400  NA 146* 145  K 3.4* 3.8  CL 109 110  CO2 20 22  GLUCOSE 122* 161*  BUN 60* 50*  CREATININE 3.05* 3.18*  CALCIUM 7.7* 7.3*   PT/INR No results found for this basename: LABPROT:2,INR:2 in the last 72 hours ABG No results found for this basename: PHART:2,PCO2:2,PO2:2,HCO3:2 in the last 72 hours  Studies/Results: Dg Chest Port 1 View  08/22/2011  *RADIOLOGY REPORT*  Clinical Data: Respiratory  failure.  Shortness of breath.  PORTABLE CHEST - 1 VIEW  Comparison: Chest x-ray 08/21/2011.  Findings: An endotracheal tube is in place with tip 3.4 cm above the carina. There is a left-sided subclavian central venous catheter with tip terminating in the proximal superior vena cava. Nasogastric tube is seen coiled in the stomach.  Lung volumes are low.  There are extensive bibasilar opacities which may represent areas of atelectasis and/or consolidation with superimposed small bilateral pleural effusions.  Mild elevation of the left hemidiaphragm is unchanged.  Pulmonary vascular crowding accentuated by low lung volumes, without evidence of pulmonary edema.  Heart size is mildly enlarged (unchanged). The patient is rotated to the right on today's exam, resulting in distortion of the mediastinal contours and reduced diagnostic sensitivity and specificity for mediastinal pathology.  Atherosclerotic calcifications within the arch of the aorta.  IMPRESSION: 1.  Support apparatus, as above. 2.  Low lung volumes with persistent bibasilar opacities which may represent areas of atelectasis and/or consolidation with superimposed small bilateral pleural effusions. 3.  Mild cardiomegaly. 4.  Atherosclerosis.  Original Report Authenticated By: Florencia Reasons, M.D.    Anti-infectives: Anti-infectives     Start     Dose/Rate Route Frequency Ordered Stop   08/21/11 1200   fluconazole (DIFLUCAN) IVPB 200 mg        200 mg 100 mL/hr over 60 Minutes Intravenous Every 24 hours 08/21/11 1101     08/19/11 1400   vancomycin (VANCOCIN) IVPB 1000 mg/200 mL premix  Status:  Discontinued        1,000 mg 200 mL/hr over 60 Minutes Intravenous Every 48 hours 08/19/11 1257 08/21/11 1054   08/19/11 1330   piperacillin-tazobactam (ZOSYN) IVPB 2.25 g  Status:  Discontinued        2.25 g 100 mL/hr over 30 Minutes Intravenous 3 times per day 08/19/11 1257 08/21/11 1054   08/14/11 1800   cefTRIAXone (ROCEPHIN) 1 g in dextrose 5 %  50 mL IVPB  Status:  Discontinued        1 g 100 mL/hr over 30 Minutes Intravenous Every 24 hours 08/14/11 1706 08/16/11 1354   08/06/11 1400   ceFAZolin (ANCEF) IVPB 1 g/50 mL premix  Status:  Discontinued        1 g 100 mL/hr over 30 Minutes Intravenous 60 min pre-op 08/06/11 1313 08/06/11 1800          Assessment/Plan: s/p Procedure(s) (LRB): HERNIA REPAIR VENTRAL ADULT (N/A) s/p Procedure(s) (LRB):  HERNIA REPAIR VENTRAL ADULT (N/A)- Stable, healing well, TF have been stopped. (swallow study scheduled for today) Seizure disorder: Neurology following. Mental status remains poor. Acute respiratory failure: resolved patient extubated yesterday remains on room air.  Acute on Chronic renal failure- received HD 6/23 for hyperkalemia, which improved. Renal following.  CV- patient hypotension is improved.Hemoglobin @ 7.6 today, will defer management to CCM.     LOS: 17 days    DUANNE, JEREMY 08/23/2011 7:41 AM  No change in abd exam - wound Will see patient PRN over the weekend.  Wilmon Arms. Corliss Skains, MD, [facility]  08/23/2011 8:55 AM

## 2011-08-23 NOTE — Progress Notes (Signed)
Subjective:  Patient is extubated.  However, she did not get modafinil this morning because she is scheduled for swallow eval.  [name] hypersomnolent can be aroused but does not follow commands.  No complications overnight   Objective: Current vital signs: BP 114/47  Pulse 91  Temp 98.1 F (36.7 C) (Oral)  Resp 19  Ht 5\' 3"  (1.6 m)  Wt 68.5 kg (151 lb 0.2 oz)  BMI 26.75 kg/m2  SpO2 93% Vital signs in last 24 hours: Temp:  [98.1 F (36.7 C)-100.6 F (38.1 C)] 98.1 F (36.7 C) (06/28 0422) Pulse Rate:  [91-105] 91  (06/28 0600) Resp:  [19-27] 19  (06/28 0600) BP: (114-169)/(47-73) 114/47 mmHg (06/28 0600) SpO2:  [93 %-100 %] 93 % (06/28 0600) FiO2 (%):  [29.8 %-30 %] 29.8 % (06/27 1100) Weight:  [68.5 kg (151 lb 0.2 oz)] 68.5 kg (151 lb 0.2 oz) (06/28 0400)  Intake/Output from previous day: 06/27 0701 - 06/28 0700 In: 1151.5 [I.V.:480; Blood:337.5; NG/GT:120; IV Piggyback:214] Out: 495 [Urine:495] Intake/Output this shift:   Nutritional status: NPO   Physical exam: General Hypersomnolent, extubated, not on sedation Lungs: CTA Heart: RRR Abdomen: bowel sounds present Extremities: Edema, b/l foot drop Neurologic Exam: Hypersomnolent,  Pupils isocoria, reactive Corneal reactive GAG intact Withdraws to pain  No posturing noticed Reflexes 0 B/L foot drop  Lab Results: Results for orders placed during the hospital encounter of 08/06/11 (from the past 48 hour(s))  GLUCOSE, CAPILLARY     Status: Abnormal   Collection Time   08/21/11  7:48 AM      Component Value Range Comment   Glucose-Capillary 68 (*) 70 - 99 mg/dL   GLUCOSE, CAPILLARY     Status: Normal   Collection Time   08/21/11 11:07 AM      Component Value Range Comment   Glucose-Capillary 70  70 - 99 mg/dL   OCCULT BLOOD X 1 CARD TO LAB, STOOL     Status: Normal   Collection Time   08/21/11 11:13 AM      Component Value Range Comment   Fecal Occult Bld NEGATIVE     CLOSTRIDIUM DIFFICILE BY PCR      Status: Normal   Collection Time   08/21/11 11:14 AM      Component Value Range Comment   C difficile by pcr NEGATIVE  NEGATIVE   GLUCOSE, CAPILLARY     Status: Normal   Collection Time   08/21/11  4:35 PM      Component Value Range Comment   Glucose-Capillary 97  70 - 99 mg/dL   GLUCOSE, CAPILLARY     Status: Abnormal   Collection Time   08/21/11  8:09 PM      Component Value Range Comment   Glucose-Capillary 105 (*) 70 - 99 mg/dL   GLUCOSE, CAPILLARY     Status: Abnormal   Collection Time   08/22/11 12:18 AM      Component Value Range Comment   Glucose-Capillary 144 (*) 70 - 99 mg/dL    Comment 1 Documented in Chart      Comment 2 Notify RN     BASIC METABOLIC PANEL     Status: Abnormal   Collection Time   08/22/11  4:00 AM      Component Value Range Comment   Sodium 145  135 - 145 mEq/L    Potassium 3.8  3.5 - 5.1 mEq/L DELTA CHECK NOTED   Chloride 110  96 - 112 mEq/L  CO2 22  19 - 32 mEq/L    Glucose, Bld 161 (*) 70 - 99 mg/dL    BUN 50 (*) 6 - 23 mg/dL    Creatinine, Ser 1.61 (*) 0.50 - 1.10 mg/dL    Calcium 7.3 (*) 8.4 - 10.5 mg/dL    GFR calc non Af Amer 13 (*) >90 mL/min    GFR calc Af Amer 15 (*) >90 mL/min   CBC     Status: Abnormal   Collection Time   08/22/11  4:00 AM      Component Value Range Comment   WBC 6.4  4.0 - 10.5 K/uL    RBC 2.16 (*) 3.87 - 5.11 MIL/uL    Hemoglobin 6.7 (*) 12.0 - 15.0 g/dL    HCT 09.6 (*) 04.5 - 46.0 %    MCV 100.0  78.0 - 100.0 fL    MCH 31.0  26.0 - 34.0 pg    MCHC 31.0  30.0 - 36.0 g/dL    RDW 40.9 (*) 81.1 - 15.5 %    Platelets 56 (*) 150 - 400 K/uL CONSISTENT WITH PREVIOUS RESULT  PHENYTOIN LEVEL, TOTAL     Status: Abnormal   Collection Time   08/22/11  4:00 AM      Component Value Range Comment   Phenytoin Lvl 8.1 (*) 10.0 - 20.0 ug/mL   ALBUMIN     Status: Abnormal   Collection Time   08/22/11  4:00 AM      Component Value Range Comment   Albumin 1.5 (*) 3.5 - 5.2 g/dL   VALPROIC ACID LEVEL     Status: Normal    Collection Time   08/22/11  4:00 AM      Component Value Range Comment   Valproic Acid Lvl 65.4  50.0 - 100.0 ug/mL   LACTIC ACID, PLASMA     Status: Normal   Collection Time   08/22/11  4:00 AM      Component Value Range Comment   Lactic Acid, Venous 0.7  0.5 - 2.2 mmol/L   PROCALCITONIN     Status: Normal   Collection Time   08/22/11  4:00 AM      Component Value Range Comment   Procalcitonin 4.42     GLUCOSE, CAPILLARY     Status: Abnormal   Collection Time   08/22/11  4:15 AM      Component Value Range Comment   Glucose-Capillary 135 (*) 70 - 99 mg/dL   PREPARE RBC (CROSSMATCH)     Status: Normal   Collection Time   08/22/11  5:30 AM      Component Value Range Comment   Order Confirmation ORDER PROCESSED BY BLOOD BANK     TYPE AND SCREEN     Status: Normal (Preliminary result)   Collection Time   08/22/11  5:40 AM      Component Value Range Comment   ABO/RH(D) O POS      Antibody Screen NEG      Sample Expiration 08/25/2011      Unit Number [id number]      Blood Component Type RED CELLS,LR      Unit division 00      Status of Unit ISSUED      Transfusion Status OK TO TRANSFUSE      Crossmatch Result Compatible      Unit Number [id number]      Blood Component Type RED CELLS,LR      Unit division 00  Status of Unit ISSUED      Transfusion Status OK TO TRANSFUSE      Crossmatch Result Compatible      Unit Number [id number]      Blood Component Type RED CELLS,LR      Unit division 00      Status of Unit ALLOCATED      Transfusion Status OK TO TRANSFUSE      Crossmatch Result Compatible     GLUCOSE, CAPILLARY     Status: Abnormal   Collection Time   08/22/11  7:43 AM      Component Value Range Comment   Glucose-Capillary 160 (*) 70 - 99 mg/dL   PREPARE RBC (CROSSMATCH)     Status: Normal   Collection Time   08/22/11 10:50 AM      Component Value Range Comment   Order Confirmation ORDER PROCESSED BY BLOOD BANK     GLUCOSE, CAPILLARY     Status: Abnormal   Collection  Time   08/22/11 11:48 AM      Component Value Range Comment   Glucose-Capillary 132 (*) 70 - 99 mg/dL   GLUCOSE, CAPILLARY     Status: Abnormal   Collection Time   08/22/11  3:45 PM      Component Value Range Comment   Glucose-Capillary 121 (*) 70 - 99 mg/dL   GLUCOSE, CAPILLARY     Status: Abnormal   Collection Time   08/22/11  7:48 PM      Component Value Range Comment   Glucose-Capillary 119 (*) 70 - 99 mg/dL   GLUCOSE, CAPILLARY     Status: Abnormal   Collection Time   08/23/11 12:14 AM      Component Value Range Comment   Glucose-Capillary 120 (*) 70 - 99 mg/dL    Comment 1 Documented in Chart      Comment 2 Notify RN     GLUCOSE, CAPILLARY     Status: Abnormal   Collection Time   08/23/11  4:21 AM      Component Value Range Comment   Glucose-Capillary 120 (*) 70 - 99 mg/dL    Comment 1 Documented in Chart      Comment 2 Notify RN     BASIC METABOLIC PANEL     Status: Abnormal   Collection Time   08/23/11  4:30 AM      Component Value Range Comment   Sodium 146 (*) 135 - 145 mEq/L    Potassium 3.4 (*) 3.5 - 5.1 mEq/L    Chloride 109  96 - 112 mEq/L    CO2 20  19 - 32 mEq/L    Glucose, Bld 122 (*) 70 - 99 mg/dL    BUN 60 (*) 6 - 23 mg/dL    Creatinine, Ser 7.82 (*) 0.50 - 1.10 mg/dL    Calcium 7.7 (*) 8.4 - 10.5 mg/dL    GFR calc non Af Amer 14 (*) >90 mL/min    GFR calc Af Amer 16 (*) >90 mL/min   CBC     Status: Abnormal   Collection Time   08/23/11  4:30 AM      Component Value Range Comment   WBC 5.7  4.0 - 10.5 K/uL    RBC 3.87  3.87 - 5.11 MIL/uL    Hemoglobin 11.7 (*) 12.0 - 15.0 g/dL    HCT 95.6  21.3 - 08.6 %    MCV 93.0  78.0 - 100.0 fL    MCH 30.2  26.0 -  34.0 pg    MCHC 32.5  30.0 - 36.0 g/dL    RDW 04.5 (*) 40.9 - 15.5 %    Platelets 60 (*) 150 - 400 K/uL CONSISTENT WITH PREVIOUS RESULT    Recent Results (from the past 240 hour(s))  URINE CULTURE     Status: Normal   Collection Time   08/14/11  5:39 PM      Component Value Range Status Comment    Specimen Description URINE, CATHETERIZED   Final    Special Requests NONE   Final    Culture  Setup Time [id number]   Final    Colony Count NO GROWTH   Final    Culture NO GROWTH   Final    Report Status 08/15/2011 FINAL   Final   CULTURE, BLOOD (ROUTINE X 2)     Status: Normal (Preliminary result)   Collection Time   08/19/11  1:15 PM      Component Value Range Status Comment   Specimen Description BLOOD RIGHT HAND   Final    Special Requests BOTTLES DRAWN AEROBIC ONLY 4CC   Final    Culture  Setup Time [id number]   Final    Culture     Final    Value:        BLOOD CULTURE RECEIVED NO GROWTH TO DATE CULTURE WILL BE HELD FOR 5 DAYS BEFORE ISSUING A FINAL NEGATIVE REPORT   Report Status PENDING   Incomplete   CULTURE, BLOOD (ROUTINE X 2)     Status: Normal (Preliminary result)   Collection Time   08/19/11  1:30 PM      Component Value Range Status Comment   Specimen Description BLOOD RIGHT HAND   Final    Special Requests BOTTLES DRAWN AEROBIC ONLY 5CC   Final    Culture  Setup Time [id number]   Final    Culture     Final    Value:        BLOOD CULTURE RECEIVED NO GROWTH TO DATE CULTURE WILL BE HELD FOR 5 DAYS BEFORE ISSUING A FINAL NEGATIVE REPORT   Report Status PENDING   Incomplete   CULTURE, RESPIRATORY     Status: Normal   Collection Time   08/19/11  4:42 PM      Component Value Range Status Comment   Specimen Description TRACHEAL ASPIRATE   Final    Special Requests NONE   Final    Gram Stain     Final    Value: FEW WBC PRESENT,BOTH PMN AND MONONUCLEAR     NO SQUAMOUS EPITHELIAL CELLS SEEN     FEW YEAST   Culture FEW CANDIDA ALBICANS   Final    Report Status 08/21/2011 FINAL   Final   URINE CULTURE     Status: Normal   Collection Time   08/19/11  5:30 PM      Component Value Range Status Comment   Specimen Description URINE, CATHETERIZED   Final    Special Requests Normal   Final    Culture  Setup Time [id number]   Final    Colony Count >=100,000 COLONIES/ML    Final    Culture YEAST   Final    Report Status 08/20/2011 FINAL   Final   CLOSTRIDIUM DIFFICILE BY PCR     Status: Normal   Collection Time   08/21/11 11:14 AM      Component Value Range Status Comment   C difficile by pcr NEGATIVE  NEGATIVE Final  Lipid Panel No results found for this basename: CHOL,TRIG,HDL,CHOLHDL,VLDL,LDLCALC in the last 72 hours  Studies/Results: Dg Chest Port 1 View  08/22/2011  *RADIOLOGY REPORT*  Clinical Data: Respiratory failure.  Shortness of breath.  PORTABLE CHEST - 1 VIEW  Comparison: Chest x-ray 08/21/2011.  Findings: An endotracheal tube is in place with tip 3.4 cm above the carina. There is a left-sided subclavian central venous catheter with tip terminating in the proximal superior vena cava. Nasogastric tube is seen coiled in the stomach.  Lung volumes are low.  There are extensive bibasilar opacities which may represent areas of atelectasis and/or consolidation with superimposed small bilateral pleural effusions.  Mild elevation of the left hemidiaphragm is unchanged.  Pulmonary vascular crowding accentuated by low lung volumes, without evidence of pulmonary edema.  Heart size is mildly enlarged (unchanged). The patient is rotated to the right on today's exam, resulting in distortion of the mediastinal contours and reduced diagnostic sensitivity and specificity for mediastinal pathology.  Atherosclerotic calcifications within the arch of the aorta.  IMPRESSION: 1.  Support apparatus, as above. 2.  Low lung volumes with persistent bibasilar opacities which may represent areas of atelectasis and/or consolidation with superimposed small bilateral pleural effusions. 3.  Mild cardiomegaly. 4.  Atherosclerosis.  Original Report Authenticated By: Florencia Reasons, M.D.    Medications:  Scheduled:   . albuterol  2.5 mg Nebulization Q6H   And  . ipratropium  0.5 mg Nebulization Q6H  . antiseptic oral rinse  15 mL Mouth Rinse QID  . chlorhexidine  15 mL Mouth  Rinse BID  . famotidine  20 mg Per Tube Daily  . feeding supplement (NEPRO CARB STEADY)  1,000 mL Per Tube Q24H  . fluconazole (DIFLUCAN) IV  200 mg Intravenous Q24H  . methylPREDNISolone (SOLU-MEDROL) injection  40 mg Intravenous Q12H  . modafinil  200 mg Oral Daily  . phenytoin (DILANTIN) IV  100 mg Intravenous Q8H  . valproate sodium  500 mg Intravenous Q12H  . DISCONTD: hydrocortisone sod succinate (SOLU-CORTEF) injection  50 mg Intravenous Q12H    Assessment/Plan: Patient with PRES and seizures. Seizures are well controlled.  Encephalopathic   Recommendations: 1) Provigil  2) Head CT non contrast    LOS: 17 days

## 2011-08-24 ENCOUNTER — Inpatient Hospital Stay (HOSPITAL_COMMUNITY): Payer: Medicare Other

## 2011-08-24 DIAGNOSIS — D631 Anemia in chronic kidney disease: Secondary | ICD-10-CM

## 2011-08-24 DIAGNOSIS — N189 Chronic kidney disease, unspecified: Secondary | ICD-10-CM

## 2011-08-24 LAB — BASIC METABOLIC PANEL
BUN: 74 mg/dL — ABNORMAL HIGH (ref 6–23)
CO2: 17 mEq/L — ABNORMAL LOW (ref 19–32)
Chloride: 112 mEq/L (ref 96–112)
Creatinine, Ser: 3.26 mg/dL — ABNORMAL HIGH (ref 0.50–1.10)

## 2011-08-24 LAB — GLUCOSE, CAPILLARY
Glucose-Capillary: 187 mg/dL — ABNORMAL HIGH (ref 70–99)
Glucose-Capillary: 197 mg/dL — ABNORMAL HIGH (ref 70–99)
Glucose-Capillary: 160 mg/dL — ABNORMAL HIGH (ref 70–99)

## 2011-08-24 LAB — CBC
HCT: 37.3 % (ref 36.0–46.0)
Hemoglobin: 12 g/dL (ref 12.0–15.0)
MCH: 29.9 pg (ref 26.0–34.0)
MCV: 93 fL (ref 78.0–100.0)
RBC: 4.01 MIL/uL (ref 3.87–5.11)

## 2011-08-24 LAB — POCT I-STAT 3, ART BLOOD GAS (G3+)
Patient temperature: 98.6
pCO2 arterial: 36.3 mmHg (ref 35.0–45.0)
pH, Arterial: 7.319 — ABNORMAL LOW (ref 7.350–7.400)

## 2011-08-24 MED ORDER — SODIUM CHLORIDE 0.9 % IV SOLN: 100.0000 mL | INTRAVENOUS | Status: DC | PRN

## 2011-08-24 MED ORDER — HEPARIN SODIUM (PORCINE) 1000 UNIT/ML DIALYSIS: 1000.0000 [IU] | INTRAMUSCULAR | Status: DC | PRN

## 2011-08-24 MED ORDER — NEPRO/CARBSTEADY PO LIQD
237.0000 mL | ORAL | Status: DC | PRN
  Filled 2011-08-24: qty 237

## 2011-08-24 MED ORDER — ALTEPLASE 2 MG IJ SOLR
2.0000 mg | Freq: Once | INTRAMUSCULAR | Status: AC | PRN
  Filled 2011-08-24: qty 2

## 2011-08-24 MED ORDER — PENTAFLUOROPROP-TETRAFLUOROETH EX AERO: 1.0000 "application " | INHALATION_SPRAY | CUTANEOUS | Status: DC | PRN

## 2011-08-24 MED ORDER — POTASSIUM CHLORIDE 20 MEQ/15ML (10%) PO LIQD
20.0000 meq | Freq: Once | ORAL | Status: AC
  Administered 2011-08-24: 20 meq
  Filled 2011-08-24: qty 15

## 2011-08-24 MED ORDER — LIDOCAINE HCL (PF) 1 % IJ SOLN: 5.0000 mL | INTRAMUSCULAR | Status: DC | PRN

## 2011-08-24 MED ORDER — HEPARIN SODIUM (PORCINE) 1000 UNIT/ML DIALYSIS
20.0000 [IU]/kg | INTRAMUSCULAR | Status: DC | PRN
  Administered 2011-08-24: 1400 [IU] via INTRAVENOUS_CENTRAL

## 2011-08-24 MED ORDER — LIDOCAINE-PRILOCAINE 2.5-2.5 % EX CREA
1.0000 "application " | TOPICAL_CREAM | CUTANEOUS | Status: DC | PRN
  Filled 2011-08-24: qty 5

## 2011-08-24 NOTE — Progress Notes (Signed)
Pt profile: 79 yowf with PMH of DM, CKD, Htn, ITP (chronic low dose pred) and one prior seizure with negative EEG. Underwent ventral hernia repair 6/11. Suffered tonic-clonic seizure 6/17. Required urgent HD 6/22 for hyperkalemia. Suffered recurrent tonic clonic seizures 6/24 requiring 4 mg Ativan IV. Intubated for depressed LOC. PCCM assumed care 6/24  Active problems: Principal Problem:  *Acute respiratory failure Active Problems:  Thrombocytopenia  Acute on chronic renal insufficiency  Anemia of renal disease  Recurrent incisional hernia with incarceration  Seizure, convulsive  Hyperkalemia  Encephalopathy acute  Hypotension  Status post laparotomy  Ileus following gastrointestinal surgery  Candiduria  History of ITP   Subj: Has tolerated extubation. Profoundly weak. + F/C. No distress. Too lethargic for swallow eval  Obj: Filed Vitals:   08/24/11 0810  BP:   Pulse:   Temp: 97.8 F (36.6 C)  Resp:     Gen: chronically ill appearing, NAD HEENT: WNL Neck: JVP not visualized, no LAN Chest: slightly coarse B BS Cardiac: RRR s M Abd: Soft, diminished BS, ecchymosis over RLQ to R flank - NSC Ext: trace symmetric LE edema    Dg Chest Port 1 View  08/23/2011  *RADIOLOGY REPORT*  Clinical Data: Pulmonary edema, weakness, cough  PORTABLE CHEST - 1 VIEW  Comparison: Portable exam 1057 hours compared to 08/22/2011  Findings: Enlargement of cardiac silhouette. Interval removal of endotracheal nasogastric tubes. Left subclavian line stable. Very low lung volumes with bibasilar atelectasis greater on left. Upper lungs clear. Calcified tortuous aorta.  IMPRESSION: Right low lung volumes with bibasilar atelectasis left greater than right. Enlargement of cardiac silhouette.  Original Report Authenticated By: Lollie Marrow, M.D.    Lab 08/24/11 0429 08/23/11 0430 08/22/11 0400  NA 146* 146* 145  K 3.8 3.4* 3.8  CL 112 109 110  CO2 17* 20 22  BUN 74* 60* 50*  CREATININE 3.26*  3.05* 3.18*  GLUCOSE 222* 122* 161*    Lab 08/24/11 0429 08/23/11 0430 08/22/11 0400  HGB 12.0 11.7* 6.7*  HCT 37.3 36.0 21.6*  WBC 6.4 5.7 6.4  PLT 63* 60* 56*        IMPRESSION/PLAN:   Acute respiratory failure due to decreased LOC post seizure - Extubated 6/27. Post extubation care implemented  - 6/29 - kind of lethargic. Will check abg   Hypotension - resolved   PRES (per MRI brain 6/18)  Seizure, convulsive  Encephalopathy acute - Anticonvulsants per Neuro   Acute on chronic renal insufficiency Lab Results  Component Value Date   CREATININE 3.26* 08/24/2011   CREATININE 3.05* 08/23/2011   CREATININE 3.18* 08/22/2011  6/29  Dc lasix and monitor renal function   Hyperkalemia - resolved Hypernatremia   Lab 08/24/11 0429 08/23/11 0430 08/22/11 0400  K 3.8 3.4* 3.8    - Monitor daily BMET - Monitor I/Os -free h2o   Anemia of renal disease  Basename 08/24/11 0429 08/23/11 0430  HGB 12.0 11.7*    Thrombocytopenia, h/o ITP on baseline dose of Pred .pbplt - No indication for RBCs or platelets presently - LMWH stopped 6/24 - SCDs for DVT prophy - Changed hydrocortisone to solumedrol 6/27    Recurrent incisional hernia with incarceration  Ileus following gastrointestinal surgery - resolved  Status post laparotomy  Diarrhea - Post op mgmt per CCS - NPO after extubation. Panda tube placed 6/28 - TFs ordered - Stool for C diff NEG - Hemoccult NEG  Candiduria  - Fluconazole ordered 6/26 > 7/1 (planned)  Failure to  Thrive: Support as needed  Needs to stay in ICU as she is at high risk of re-intubation due to depressed LOC and generalized weakness   Brett Canales Minor ACNP [facility] PCCM Pager [phone] till 3 pm If no answer page [phone] 08/24/2011, 9:58 AM   STAFF NOTE: I, Dr Lavinia Sharps have personally reviewed patient's available data, including medical history, events of note, physical examination and test results as part of my evaluation. I have  discussed with resident/NP and other care providers such as pharmacist, RN and RRT.  In addition,  I personally evaluated patient and elicited key findings of  Acute respiratory failure - s/p extubation but is kind of lethargic ? From PRES. [place] ABG  Rest per NP/medical resident whose note is outlined above and that I agree with  The patient is critically ill with multiple organ systems failure and requires high complexity decision making for assessment and support, frequent evaluation and titration of therapies, application of advanced monitoring technologies and extensive interpretation of multiple databases.   Critical Care Time devoted to patient care services described in this note is  31  Minutes.  Dr. Kalman Shan, M.D., [facility].C.P Pulmonary and Critical Care Medicine Staff Physician [facility] Pager: [phone], If no answer or between  15:00h - 7:00h: call [phone]  08/24/2011 12:34 PM

## 2011-08-24 NOTE — Progress Notes (Signed)
Per MD note pt continues to be difficult to arouse and will undergo dialysis today. Given that pt has Panda SLP will defer swallow eval and request that Sunday SLP visit pt to check for readiness to participate. If SLP needed please page [phone]. Thanks, Harlon Ditty, MA CCC-SLP [phone]

## 2011-08-24 NOTE — Progress Notes (Signed)
Arterial stick site on fistula very difficult to get to stop bleeding. Surgifoam used mult times to get bleeding to stop. Bleeding stop after pin point pressure was held with a Q-tip for appox . Total site holding time was 1hr 

## 2011-08-24 NOTE — Progress Notes (Signed)
[name]  Subjective:  Sleepy, difficult to arouse   Objective: Vital signs in last 24 hours: Blood pressure 140/60, pulse 97, temperature 97.8 F (36.6 C), temperature source Oral, resp. rate 20, height 5\' 3"  (1.6 m), weight 67.6 kg (149 lb 0.5 oz), SpO2 99.00%.  PHYSICAL EXAM General--as above Chest--rhonchi Heart--no rub Abd--post op, incision clean, nontender Extr--edema of UE, less edema in LE, AVF patent L upper arm  Date  Weight 11 Jun  59.9 kg 24 Jun  63.9 kg 26 Jun  68.2 kg 28 Jun  68.5 kg 29 Jun  67.6 kg  I/O yesterday--2954/1890 (790 as urine and 1100 as diarrhea)  Still on Solumedrol 40 BID and furosemide 100 IV Qid  Lab Results:   Lab 08/24/11 0429 08/23/11 0430 08/22/11 0400 08/20/11 0452 08/19/11 1354  NA 146* 146* 145 -- --  K 3.8 3.4* 3.8 -- --  CL 112 109 110 -- --  CO2 17* 20 22 -- --  BUN 74* 60* 50* -- --  CREATININE 3.26* 3.05* 3.18* -- --  ALB -- -- -- -- --  GLUCOSE 222* -- -- -- --  CALCIUM 7.6* 7.7* 7.3* -- --  PHOS -- -- -- 4.0 3.6     Basename 08/24/11 0429 08/23/11 0430  WBC 6.4 5.7  HGB 12.0 11.7*  HCT 37.3 36.0  PLT 63* 60*    Assessment/Plan: 1. ARF on Chronic kidney disease stage IV: Baseline CKD, creatinine 3.26 today.  She has vascular access in place. ARF due to ATN vs hypoperfusion. Creat increasingUOP not great despite high dose IV lasix. Dialyzed Sunday night(23 Jun) for high K. On diuretics now. She still appears wet. Will check CXR, will continue diuretics, but will also resume dialysis today for vol/ removal and to see if mental status improves   2. AMS/Seizure?PRES (MRI 18 Jun)- post ictal/sedated to  Curently extubated. Given dilantin, provigil, and valproate. Neuro is following  3. Hypertension--BP 140/60 today 4. Anemia- on aranesp, Fe/TIBC 17%, ferritin 6627 on 25 June, hemocult negative. Hgb 12 today (got 1 U pRBC earlier this week  5. Metabolic bone disease: phos WNL, PTH pending  6. S/p  ventral hernia repair with ileus-per [facility].  On Nepro at 40/hr  7. Hyperkalemia-K 3.8 today.4K bath in dialysis today 8. Hypernatremia-Na 146 today 9. VDRF--CXR with worsening bibasilar opacities. No fever, no purulent sputum. WBC 9100 today. On zosyn and vanco.  CXR yesteday "R low lung volumes with bibasilar atelectasis L > R" 10.  Hx ITP (had been on low dose prednison PTA) 11.  Resp failure due to decreased level of consciousness post seizure--per pulmonary  RN reports that after furosemide given last night, there was not much urine output, but "over a liter of diarrhea via flexiseal."  If there is entero-vesical fistula, I would expect more commonly stool via foley rather than urine via flexiseal.  Will watch for now.  If this continues,then CT pelvis with contrast into bladder via foley (with no oral and no IV contrast can be done).     Can solumedrol be decreased?    LOS: 18 days   Fallon Howerter F 08/24/2011,8:29 AM   .labalb

## 2011-08-24 NOTE — Procedures (Signed)
DIALYSIS NOTE  Currently on HD via L upper arm AV access [id number] Goal 3.5 L Hgb 12, K 3.8 (on 4K, 2.25 Ca bath)

## 2011-08-25 ENCOUNTER — Inpatient Hospital Stay (HOSPITAL_COMMUNITY): Payer: Medicare Other

## 2011-08-25 LAB — CULTURE, BLOOD (ROUTINE X 2): Culture: NO GROWTH

## 2011-08-25 LAB — BASIC METABOLIC PANEL
BUN: 60 mg/dL — ABNORMAL HIGH (ref 6–23)
GFR calc Af Amer: 18 mL/min — ABNORMAL LOW (ref >90)
GFR calc non Af Amer: 16 mL/min — ABNORMAL LOW (ref >90)
Potassium: 4 mEq/L (ref 3.5–5.1)
Sodium: 141 mEq/L (ref 135–145)

## 2011-08-25 LAB — CBC
HCT: 35.5 % — ABNORMAL LOW (ref 36.0–46.0)
MCHC: 32.1 g/dL (ref 30.0–36.0)
Platelets: 56 10*3/uL — ABNORMAL LOW (ref 150–400)
RDW: 17.4 % — ABNORMAL HIGH (ref 11.5–15.5)

## 2011-08-25 LAB — PHOSPHORUS: Phosphorus: 4.5 mg/dL (ref 2.3–4.6)

## 2011-08-25 LAB — ALBUMIN: Albumin: 1.6 g/dL — ABNORMAL LOW (ref 3.5–5.2)

## 2011-08-25 LAB — GLUCOSE, CAPILLARY: Glucose-Capillary: 207 mg/dL — ABNORMAL HIGH (ref 70–99)

## 2011-08-25 MED ORDER — WHITE PETROLATUM GEL
Status: AC
  Filled 2011-08-25: qty 5

## 2011-08-25 MED ORDER — HEPARIN SODIUM (PORCINE) 1000 UNIT/ML DIALYSIS: 20.0000 [IU]/kg | INTRAMUSCULAR | Status: DC | PRN

## 2011-08-25 MED ORDER — SODIUM BICARBONATE 8.4 % IV SOLN
INTRAVENOUS | Status: AC
  Filled 2011-08-25: qty 100

## 2011-08-25 NOTE — Progress Notes (Addendum)
SLP Progress Note Spoke with RN and MD regarding pt status.  Pt continues lethargic, inappropriate for evaluation of swallow function and safety at this time. Pt currently with PANDA in place.  Will continue efforts as pt tolerates. Zeola Brys B. Magdalena, [facility], CCC-SLP [phone]    Leigh Aurora 08/25/2011, 10:45 AM

## 2011-08-25 NOTE — Progress Notes (Signed)
 [facility]  Subjective:  Son and nephew at bedside. Opens eyes to voice.  Extubated. Dialysis yesterday.  Nepro via panda  40/hr Objective: Vital signs in last 24 hours: Blood pressure 149/63, pulse 103, temperature 98.1 F (36.7 C), temperature source Oral, resp. rate 16, height 5\' 3"  (1.6 m), weight 65.2 kg (143 lb 11.8 oz), SpO2 99.00%. PHYSICAL EXAM General--as above Chest--L subclavian 3 lumen cath, rhonchi  Heart--no rub Abd--nontender Extr-edema of UE, less edema in LE, AVG patent L upper arm  Date   Weight  11 Jun   59.9 kg  24 Jun   63.9 kg  26 Jun   68.2 kg  28 Jun   68.5 kg  29 Jun   67.6 kg 30 Jun   65.2 kg  Still on Solumedrol 40 BID  Off furosemide    Lab Results:   Lab 08/25/11 0526 08/24/11 0429 08/23/11 0430 08/20/11 0452 08/19/11 1354  NA 141 146* 146* -- --  K 4.0 3.8 3.4* -- --  CL 106 112 109 -- --  CO2 23 17* 20 -- --  BUN 60* 74* 60* -- --  CREATININE 2.74* 3.26* 3.05* -- --  ALB -- -- -- -- --  GLUCOSE 213* -- -- -- --  CALCIUM 7.6* 7.6* 7.7* -- --  PHOS 4.5 -- -- 4.0 3.6     Basename 08/25/11 0526 08/24/11 0429  WBC 7.5 6.4  HGB 11.4* 12.0  HCT 35.5* 37.3  PLT 56* 63*    Assessment/Plan: 1. ARF on Chronic kidney disease stage IV: Baseline CKD, creatinine 2.74 today (dialyzed yesterday). She has vascular access in place. ARF due to ATN vs hypoperfusion.  UOP not great despite high dose IV lasix. Dialyzed Sunday night(23 Jun) for high K. Off diuretics now. She still appears wet. Plan HD for Monday 2. AMS/Seizure?PRES (MRI 18 Jun)- post ictal/sedated to Curently extubated. Given dilantin, provigil, and valproate. Neuro is following  3. Hypertension--BP 149/63 today  4. Anemia- on aranesp, Fe/TIBC 17%, ferritin 6627 on 25 June, hemocult negative. Hgb 12 today (got 1 U pRBC earlier this week  5. Metabolic bone disease: phos WNL, PTH pending  6. S/p ventral hernia repair with ileus-per [facility]CS. On Nepro at 40/hr  7.  Hyperkalemia-K 4 today.2K bath in dialysis tomorrow  8. Hypernatremia-Na 141 today, d/c H2O via FT  9. VDRF--CXR with worsening bibasilar opacities. No fever, no purulent sputum. WBC 9100 today. On zosyn and vanco. CXR   "R low lung volumes with bibasilar atelectasis L > R" Remove fluid in HD 10. Hx ITP (had been on low dose prednison PTA)  11. Resp failure due to decreased level of consciousness post seizure--per pulmonary, extubated now.  Can solumedrol be decreased or d/c'd?     LOS: 19 days   Lyn Deemer F 08/25/2011,12:28 PM   .labalb

## 2011-08-25 NOTE — Progress Notes (Signed)
Pt profile: 79 yowf with PMH of DM, CKD, Htn, ITP (chronic low dose pred) and one prior seizure with negative EEG. Underwent ventral hernia repair 6/11. Suffered tonic-clonic seizure 6/17. Required urgent HD 6/22 for hyperkalemia. Suffered recurrent tonic clonic seizures 6/24 requiring 4 mg Ativan IV. Intubated for depressed LOC. PCCM assumed care 6/24  Active problems: Principal Problem:  *Acute respiratory failure Active Problems:  Thrombocytopenia  Acute on chronic renal insufficiency  Anemia of renal disease  Recurrent incisional hernia with incarceration  Seizure, convulsive  Hyperkalemia  Encephalopathy acute  Hypotension  Status post laparotomy  Ileus following gastrointestinal surgery  Candiduria  History of ITP   Subj: Stronger 6/30  Obj: Filed Vitals:   08/25/11 0751  BP:   Pulse:   Temp: 97.8 F (36.6 C)  Resp:     Gen: chronically ill appearing, NAD Very debilitated HEENT: WNL Neck: JVP not visualized, no LAN Chest: slightly coarse B BS Cardiac: RRR s M Abd: Soft, diminished BS, ecchymosis over RLQ to R flank - NSC Ext: trace symmetric LE edema Neuro: RASS -1 but follows commands   Dg Chest Port 1 View  08/25/2011  *RADIOLOGY REPORT*  Clinical Data: Atelectasis  PORTABLE CHEST - 1 VIEW  Comparison: 08/23/2011  Findings: A feeding tube has been placed, tip not seen.  Left central venous catheter stable position.  Low lung volumes. Probable layering left pleural effusion with significant adjacent atelectasis/consolidation at the left lung base.  Heart size upper limits normal.  Tortuous thoracic aorta.  IMPRESSION:  1.  Interval placement of feeding tube. 2.  Persistent left pleural effusion and left lower lung opacities.  Original Report Authenticated By: Osa Craver, M.D.   Dg Chest Port 1 View  08/23/2011  *RADIOLOGY REPORT*  Clinical Data: Pulmonary edema, weakness, cough  PORTABLE CHEST - 1 VIEW  Comparison: Portable exam 1057 hours compared to  08/22/2011  Findings: Enlargement of cardiac silhouette. Interval removal of endotracheal nasogastric tubes. Left subclavian line stable. Very low lung volumes with bibasilar atelectasis greater on left. Upper lungs clear. Calcified tortuous aorta.  IMPRESSION: Right low lung volumes with bibasilar atelectasis left greater than right. Enlargement of cardiac silhouette.  Original Report Authenticated By: Lollie Marrow, M.D.    Lab 08/25/11 0526 08/24/11 0429 08/23/11 0430  NA 141 146* 146*  K 4.0 3.8 3.4*  CL 106 112 109  CO2 23 17* 20  BUN 60* 74* 60*  CREATININE 2.74* 3.26* 3.05*  GLUCOSE 213* 222* 122*    Lab 08/25/11 0526 08/24/11 0429 08/23/11 0430  HGB 11.4* 12.0 11.7*  HCT 35.5* 37.3 36.0  WBC 7.5 6.4 5.7  PLT 56* 63* 60*        IMPRESSION/PLAN:   Acute respiratory failure due to decreased LOC post seizure - Extubated 6/27. Post extubation care implemented  - 6/29 - kind of lethargic.  -6/30 more awake   Hypotension - resolved   PRES (per MRI brain 6/18)  Seizure, convulsive  Encephalopathy acute - Anticonvulsants per Neuro   Acute on chronic renal insufficiency Lab Results  Component Value Date   CREATININE 2.74* 08/25/2011   CREATININE 3.26* 08/24/2011   CREATININE 3.05* 08/23/2011  6/29  Dc lasix and monitor renal function 6/30 note trend of creatine down   Hyperkalemia - resolved Hypernatremia -resolved 6/30  Lab 08/25/11 0526 08/24/11 0429 08/23/11 0430  K 4.0 3.8 3.4*    - Monitor daily BMET - Monitor I/Os -free h2o   Anemia of renal disease  Basename 08/25/11 0526 08/24/11 0429  HGB 11.4* 12.0    Thrombocytopenia, h/o ITP on baseline dose of Pred .pbplt - No indication for RBCs or platelets presently - LMWH stopped 6/24 - SCDs for DVT prophy - Changed hydrocortisone to solumedrol 6/27    Recurrent incisional hernia with incarceration  Ileus following gastrointestinal surgery - resolved  Status post laparotomy  Diarrhea - Post op mgmt  per [facility] - NPO after extubation. Panda tube placed 6/28 - TFs ordered - Stool for C diff NEG - Hemoccult NEG  Candiduria  - Fluconazole ordered 6/26 > 7/1 (planned)  Failure to Thrive: Support as needed  . 6/30  transfer to sdu. Consider tx to triad service on 7.1.13   Brett Canales Minor ACNP [facility] PCCM Pager [phone] till 3 pm If no answer page [phone] 08/25/2011, 8:47 AM     STAFF NOTE: I, Dr Lavinia Sharps have personally reviewed patient's available data, including medical history, events of note, physical examination and test results as part of my evaluation. I have discussed with resident/NP and other care providers such as pharmacist, RN and RRT.  In addition,  I personally evaluated patient and elicited key findings of PRES syndrome with acute resp failure.Now a few days post extubation. Very debilitated. More awake today though RASS still -1. Tx to sDU and on 08/26/11 depending on course, give to triad  Rest per NP/medical resident whose note is outlined above and that I agree with    Dr. Kalman Shan, M.D., [facility].C.P Pulmonary and Critical Care Medicine Staff Physician [facility] [place] [facility] Pager: [phone], If no answer or between  15:00h - 7:00h: call [phone]  08/25/2011 1:55 PM

## 2011-08-26 ENCOUNTER — Inpatient Hospital Stay (HOSPITAL_COMMUNITY): Payer: Medicare Other

## 2011-08-26 LAB — TYPE AND SCREEN
Antibody Screen: NEGATIVE
Unit division: 0

## 2011-08-26 LAB — CBC
HCT: 36.3 % (ref 36.0–46.0)
Hemoglobin: 11.6 g/dL — ABNORMAL LOW (ref 12.0–15.0)
MCH: 30.5 pg (ref 26.0–34.0)
MCHC: 32 g/dL (ref 30.0–36.0)
MCV: 95.5 fL (ref 78.0–100.0)

## 2011-08-26 LAB — URINE MICROSCOPIC-ADD ON

## 2011-08-26 LAB — URINALYSIS, ROUTINE W REFLEX MICROSCOPIC
Nitrite: NEGATIVE
Specific Gravity, Urine: 1.019 (ref 1.005–1.030)
Urobilinogen, UA: 0.2 mg/dL (ref 0.0–1.0)
pH: 5 (ref 5.0–8.0)

## 2011-08-26 LAB — BASIC METABOLIC PANEL
BUN: 75 mg/dL — ABNORMAL HIGH (ref 6–23)
CO2: 22 mEq/L (ref 19–32)
Chloride: 106 mEq/L (ref 96–112)
Glucose, Bld: 220 mg/dL — ABNORMAL HIGH (ref 70–99)
Potassium: 4.1 mEq/L (ref 3.5–5.1)

## 2011-08-26 LAB — GLUCOSE, CAPILLARY
Glucose-Capillary: 184 mg/dL — ABNORMAL HIGH (ref 70–99)
Glucose-Capillary: 232 mg/dL — ABNORMAL HIGH (ref 70–99)

## 2011-08-26 MED ORDER — CHLORHEXIDINE GLUCONATE 0.12 % MT SOLN: 15.0000 mL | Freq: Two times a day (BID) | OROMUCOSAL | Status: AC

## 2011-08-26 MED ORDER — INSULIN ASPART 100 UNIT/ML ~~LOC~~ SOLN: 0.0000 [IU] | SUBCUTANEOUS | Status: DC

## 2011-08-26 MED ORDER — NEPRO/CARBSTEADY PO LIQD: 1000.0000 mL | ORAL | Status: DC

## 2011-08-26 MED ORDER — BIOTENE DRY MOUTH MT LIQD: 15.0000 mL | Freq: Four times a day (QID) | OROMUCOSAL | Status: DC

## 2011-08-26 MED ORDER — ALBUTEROL SULFATE (5 MG/ML) 0.5% IN NEBU: 2.5000 mg | INHALATION_SOLUTION | Freq: Four times a day (QID) | RESPIRATORY_TRACT | Status: DC

## 2011-08-26 MED ORDER — NEPRO/CARBSTEADY PO LIQD: 237.0000 mL | ORAL | Status: DC | PRN

## 2011-08-26 MED ORDER — SODIUM CHLORIDE 0.9 % IV SOLN: 250.0000 mL | INTRAVENOUS | Status: AC | PRN

## 2011-08-26 MED ORDER — METOPROLOL TARTRATE 25 MG/10 ML ORAL SUSPENSION
12.5000 mg | Freq: Two times a day (BID) | ORAL | Status: DC
  Administered 2011-08-26: 12.5 mg
  Filled 2011-08-26 (×2): qty 5

## 2011-08-26 MED ORDER — INSULIN ASPART 100 UNIT/ML ~~LOC~~ SOLN
0.0000 [IU] | SUBCUTANEOUS | Status: DC
  Administered 2011-08-26 (×2): 2 [IU] via SUBCUTANEOUS

## 2011-08-26 MED ORDER — METHYLPREDNISOLONE SODIUM SUCC 40 MG IJ SOLR: 40.0000 mg | Freq: Two times a day (BID) | INTRAMUSCULAR | Status: AC

## 2011-08-26 MED ORDER — VALPROIC ACID 250 MG/5ML PO SYRP
500.0000 mg | ORAL_SOLUTION | Freq: Two times a day (BID) | ORAL | Status: DC
  Filled 2011-08-26: qty 10

## 2011-08-26 MED ORDER — PHENYTOIN 125 MG/5ML PO SUSP
100.0000 mg | Freq: Three times a day (TID) | ORAL | Status: DC
  Administered 2011-08-26 (×2): 100 mg via ORAL
  Filled 2011-08-26 (×3): qty 4

## 2011-08-26 MED ORDER — IPRATROPIUM BROMIDE 0.02 % IN SOLN: 0.5000 mg | Freq: Four times a day (QID) | RESPIRATORY_TRACT | Status: DC

## 2011-08-26 MED ORDER — METOPROLOL TARTRATE 25 MG/10 ML ORAL SUSPENSION: 12.5000 mg | Freq: Two times a day (BID) | ORAL | Status: DC

## 2011-08-26 MED ORDER — MODAFINIL 200 MG PO TABS: 200.0000 mg | ORAL_TABLET | Freq: Every day | ORAL | Status: DC

## 2011-08-26 MED ORDER — PHENYTOIN 125 MG/5ML PO SUSP: 100.0000 mg | Freq: Three times a day (TID) | ORAL | Status: DC

## 2011-08-26 NOTE — Progress Notes (Signed)
Orthopedic Tech Progress Note Patient Details:  [name] 05-02-1932 [id number]  Ortho Devices Type of Ortho Device: Other (comment) Ortho Device/Splint Location: bilateral prafo boots Ortho Device/Splint Interventions: Application   Acire Tang T 08/26/2011, 1:53 PM

## 2011-08-26 NOTE — Plan of Care (Signed)
Problem: Phase II Progression Outcomes Goal: Progress activity as tolerated unless otherwise ordered Outcome: Progressing See PT Note  Problem: Phase III Progression Outcomes Goal: Pain controlled on oral analgesia Outcome: Not Applicable Date Met:  08/26/11 No Pain issues Goal: Activity at appropriate level-compared to baseline (UP IN CHAIR FOR HEMODIALYSIS)  Outcome: Progressing See PT Note  Problem: Discharge Progression Outcomes Goal: Complications resolved/controlled Outcome: Progressing Going to [facility] for Rehab

## 2011-08-26 NOTE — Progress Notes (Signed)
Patient  Sent with [facility] per MD order to [facility]. Discharge summary, [place], & EMTALA sent with EMS service.  COntacted Daughter Neale Burly  @ [phone].

## 2011-08-26 NOTE — Procedures (Signed)
Tolerating hemodialysis except for tachycardia. Will continue fluid removal.  Hemodynamically stable. [name]

## 2011-08-26 NOTE — Discharge Summary (Signed)
Physician Discharge Summary  Patient ID: [name] MRN: [id number] DOB/AGE: 10/31/1932 78 y.o.  Admit date: 08/06/2011 Discharge date: 08/26/2011    Discharge Diagnoses:  Acute respiratory failure in setting of post operative abdominal surgery / seizures.  ITP / Thrombocytopenia Acute on chronic renal insufficiency Anemia of renal disease Recurrent incisional hernia with incarceration Seizure, convulsive Hyperkalemia Encephalopathy acute Hypotension Ventral hernia repair status post laparotomy  Ileus following gastrointestinal surgery Candiduria Escherichia coli UTI Diabetes mellitus  Brief Summary: [name] is a 78 y.o. y/o female with a PMH of ITP on low-dose prednisone, HTN, Anxiety / Depression, Diverticular disease, Anemia, Vit. B12 deficiency, DM, CKD (baseline creatinine seems to range between 1.8- 2.0 with an estimated GFR of 22-27 mL per minute and is status post left upper arm arteriovenous fistula placement back in September of 2012 and superficialization in January 2013) admitted on 6/11 with a five day history of increasing abdominal pain.  Workup demonstrated SBO and patient was taken to OR for ventral hernia repair on 6/11.  08/12/2011 rapid response was called patient's room for altered mental status and then was noted to have seizure activity.  MRI was evaluated and she was found  to have PRES.  She was started on Keppra and Dilantin per neurology.   6/23 HD started per nephrology due to hyperkalemia.  On 6/24 she had repeat seizure activity and was intubated for airway protection.  She subsequently was extubated on 6/27.       Hosptial Summary by Discharge Diagnosis   Acute respiratory failure due to decreased LOC post seizure.  Initially she required intubation setting of seizure activity for airway protection. She was extubated on 6/27. Post extubation she required no further pulmonary interventions. She has noted pcxr with LLL haziness.  Recommend continue  negative balance, likely layering effusion.   Hypotension - resolved with IV fluids. Related to hypovolemia.  Mild tachycardia - worse on HD.  Continue home beta blcoker - improved with metoprolol.  PRES (per MRI brain 6/18) / Seizure, convulsive / Encephalopathy acute.  See above 6/18 MRI>>>Symmetric altered signal intensity involving the cerebellum bilaterally, mid to posterior temporal lobes  bilaterally, occipital lobes bilaterally more notable on the right the medial aspect of the parietal lobes. The distribution and  history are suggestive of posterior reversible encephalopathy syndrome. Other causes such as that related to infection, metabolic abnormality and or inflammatory process felt to be less likely considerations. Global atrophy without hydrocephalus.  Moderate small vessel disease type changes. No acute thrombotic infarct. No intracranial hemorrhage. No intracranial mass lesion detected on this unenhanced exam. Abnormal appearance of the left vertebral artery which may be  occluded. Paranasal sinus mucosal thickening. Partially empty sella incidentally noted.  Recommendations -Continue Anticonvulsants.  She will need a free Dilantin level in the a.m. with medication adjustment as appropriate. -Continue physical therapy efforts -treat underlying metabolic issues     Acute on chronic renal insufficiency / Hyperkalemia - resolved / Hypernatremia -resolved 6/30  Acute renal failure thought secondary to ATN versus hypoperfusion. Oliguric.     Lab Results   Component  Value  Date    CREATININE  2.88*  08/26/2011    CREATININE  2.74*  08/25/2011    CREATININE  3.26*  08/24/2011    Lab  08/26/11 0500  08/25/11 0526  08/24/11 0429   K  4.1  4.0  3.8    Recommendations -Continue hemodialysis per [facility] nephrology recommendations.   Anemia of renal disease -no acute  interventions at this time.  Consider transfusion only if hemoglobin below 7 or acute decrease greater than 2  g.  Basename  08/26/11 0500  08/25/11 0526   HGB  11.6*  11.4*    Thrombocytopenia, h/o ITP on baseline dose of Pred  - No indication for RBCs or platelets presently  -Continue SCDs for DVT prophylaxis  - Changed hydrocortisone to solumedrol 6/27.  Recommend to continue this at current dosage of equivalent prednisone in the am with dose reduction based on platelet rise.   Recurrent incisional hernia with incarceration  Ileus following gastrointestinal surgery - resolved  Status post laparotomy   Diarrhea  - NPO after extubation. Panda tube placed 6/28 - TFs tolerated  - Stool for C diff NEG  - Hemoccult NEG    Candiduria  - Fluconazole ordered 6/26 > 7/1 (planned), repeat UA if can obtain    Failure to Thrive: Continue supportive care.        Consults: Nephrology [facility] Surgery Physical therapy Speech therapy   Significant Diagnostic Studies:  6/18 ECHO>>>Left ventricle: The cavity size was normal. There was mild and in and and  concentric hypertrophy. Systolic function was normal. The estimated ejection fraction was in the range of 55% to 60%. Wall motion was normal; there were no regional wall motion abnormalities. Doppler parameters are consistent and with abnormal left ventricular relaxation (grade 1 diastolic dysfunction). The E/e' ratio is >10, suggesting elevated LV filling pressure. Aortic valve: Sclerosis without stenosis. Aorta: The aorta was mildly calcified. Mitral valve: Mildly thickened leaflets . Moderate,posteriorly directedregurgitation. Left atrium: Severely dilated (>42 ml/m2). Right atrium: Moderately dilated. Atrial septum: No defect or patent foramen ovale was identified. Tricuspid valve: Mildly thickened leaflets and prolapse of  the leaflet tips. There is moderate to severe tricuspid regurgitation. Reversal of flow in the hepatic veins was not conclusively demonstrated. Pulmonary arteries: PA peak pressure: 62mm Hg (S). Inferior vena cava:  The vessel was dilated; the respirophasic diameter changes were blunted (< 50%); findings are consistent with elevated central venous pressure.  Discharge Exam: Gen: chronically ill appearing, no distress, Very debilitated  HEENT: WNL  Neck: JVP not visualized, no LAN  Chest: reduced  Cardiac: RRR s M  Abd: Soft, diminished BS, ecchymosis over RLQ to R flank - NSC  Ext: trace symmetric LE edema  Neuro: RASS -1    Discharge Labs  BMET  Lab 08/26/11 0500 08/25/11 0526 08/24/11 0429 08/23/11 0430 08/22/11 0400 08/20/11 0452  NA 141 141 146* 146* 145 --  K 4.1 4.0 -- -- -- --  CL 106 106 112 109 110 --  CO2 22 23 17* 20 22 --  GLUCOSE 220* 213* 222* 122* 161* --  BUN 75* 60* 74* 60* 50* --  CREATININE 2.88* 2.74* 3.26* 3.05* 3.18* --  CALCIUM 8.0* 7.6* 7.6* 7.7* 7.3* --  MG 1.8 1.7 -- -- -- 1.4*  PHOS -- 4.5 -- -- -- 4.0     CBC   Lab 08/26/11 0500 08/25/11 0526 08/24/11 0429  HGB 11.6* 11.4* 12.0  HCT 36.3 35.5* 37.3  WBC 8.4 7.5 6.4  PLT 69* 56* 63*   Anti-Coagulation No results found for this basename: INR:5 in the last 168 hours    Discharge Orders    Future Appointments: Provider: Department: Dept Phone: Center:   09/18/2011 9:00 AM Delcie Roch [facility] [phone] None   09/18/2011 9:30 AM [facility] [phone] None   10/23/2011 9:00 AM Alvina Filbert  [facility] [phone] None   10/23/2011 9:15 AM Myrtis Ser, NP [facility] [phone] None   10/23/2011 9:45 AM [facility] [phone] None   11/20/2011 9:30 AM Joycie Peek [place]erson [facility] [phone] None   11/20/2011 10:00 AM [facility] [phone] None   12/18/2011 9:00 AM Mauri Brooklyn [facility] [phone] None   12/18/2011 9:30 AM [facility] [phone] None   01/22/2012 9:00 AM Joycie Peek Hickerson [facility] [phone] None   01/22/2012 9:30 AM [facility]  Inj Nurse [facility] [phone] None   02/20/2012 9:00 AM Alvina Filbert [facility] [phone] None   02/20/2012 9:30 AM [facility] [phone] None   03/18/2012 9:00 AM Alvina Filbert [facility] [phone] None   03/18/2012 9:30 AM [facility] [phone] None   04/22/2012 9:00 AM Alvina Filbert [facility] [phone] None   04/22/2012 9:30 AM [facility] [phone] None   05/20/2012 9:00 AM Mauri Brooklyn [facility] [phone] None   05/20/2012 9:30 AM [facility] [phone] None   06/17/2012 9:00 AM Joycie Peek Hickerson [facility] [phone] None   06/17/2012 9:30 AM [facility] [phone] None     Future Orders Please Complete By Expires   Diet - low sodium heart healthy      Increase activity slowly      Discharge instructions      Comments:   Will need free dilantin level in am with adjustment of medication accordingly.   Call MD for:  temperature >100.4      Call MD for:  difficulty breathing, headache or visual disturbances      Call MD for:  severe uncontrolled pain          DISCHARGE MEDICATIONS Medication List  As of 08/26/2011  3:29 PM   START taking these medications         albuterol (5 MG/ML) 0.5% nebulizer solution   Commonly known as: PROVENTIL   Take 0.5 mLs (2.5 mg total) by nebulization every 6 (six) hours.      antiseptic oral rinse Liqd   15 mLs by Mouth Rinse route QID.      chlorhexidine 0.12 % solution   Commonly known as: PERIDEX   Use as directed 15 mLs in the mouth or throat 2 (two) times daily.      feeding supplement (NEPRO CARB STEADY) Liqd   Place 1,000 mLs into feeding tube daily.      insulin aspart 100 UNIT/ML injection   Commonly known as: novoLOG   Inject 0-9 Units into the skin every 4 (four) hours.      ipratropium 0.02 % nebulizer solution   Commonly known as: ATROVENT    Take 2.5 mLs (0.5 mg total) by nebulization every 6 (six) hours.      methylPREDNISolone sodium succinate 40 mg/mL injection   Commonly known as: SOLU-MEDROL   Inject 1 mL (40 mg total) into the vein every 12 (twelve) hours.      metoprolol tartrate 25 mg/10 mL Susp   Commonly known as: LOPRESSOR   Place 5 mLs (12.5 mg total) into feeding tube 2 (two) times daily.      modafinil 200 MG tablet   Commonly known as: PROVIGIL   Place 1 tablet (200 mg total) into feeding tube daily.      phenytoin 125 MG/5ML suspension  Commonly known as: DILANTIN   Take 4 mLs (100 mg total) by mouth 3 (three) times daily.      sodium chloride 0.9 % infusion   Inject 250 mLs into the vein as needed (if IV carrier fluid needed.).         CONTINUE taking these medications         acetaminophen 500 MG tablet   Commonly known as: TYLENOL      alprazolam 2 MG tablet   Commonly known as: XANAX      atenolol 25 MG tablet   Commonly known as: TENORMIN      cloNIDine 0.1 MG tablet   Commonly known as: CATAPRES      cyanocobalamin 1000 MCG/ML injection   Commonly known as: (VITAMIN B-12)      darbepoetin 150 MCG/0.3ML Soln   Commonly known as: ARANESP      furosemide 20 MG tablet   Commonly known as: LASIX      oxybutynin 5 MG tablet   Commonly known as: DITROPAN      pioglitazone 30 MG tablet   Commonly known as: ACTOS      simvastatin 40 MG tablet   Commonly known as: ZOCOR         STOP taking these medications         predniSONE 5 MG tablet          Where to get your medications       Information on where to get these meds is not yet available. Ask your nurse or doctor.         albuterol (5 MG/ML) 0.5% nebulizer solution   antiseptic oral rinse Liqd   chlorhexidine 0.12 % solution   feeding supplement (NEPRO CARB STEADY) Liqd   insulin aspart 100 UNIT/ML injection   ipratropium 0.02 % nebulizer solution   methylPREDNISolone sodium succinate 40 mg/mL injection    metoprolol tartrate 25 mg/10 mL Susp   modafinil 200 MG tablet   phenytoin 125 MG/5ML suspension   sodium chloride 0.9 % infusion            Disposition:  To [facility] for Rehab  Discharged Condition: EMILYA JUSTEN has met maximum benefit of inpatient care and is medically stable and cleared for discharge.  Patient is pending follow up as above.      Time spent on disposition:  Greater than 35 minutes.   Signed: Canary Brim, NP-C [facility] Pgr: [phone]

## 2011-08-26 NOTE — Progress Notes (Signed)
MEDICATION RELATED CONSULT NOTE - FOLLOW UP  Pharmacy Consult:  Dilantin/Valproate Indication: Seizures  No Known Allergies  Patient Measurements: Height: 5\' 3"  (160 cm) Weight: 143 lb 15.4 oz (65.3 kg) IBW/kg (Calculated) : 52.4   Vital Signs: Temp: 97.3 F (36.3 C) (07/01 0846) Temp src: Oral (07/01 0846) BP: 152/53 mmHg (07/01 1400) Pulse Rate: 111  (07/01 1400) Intake/Output from previous day: 06/30 0701 - 07/01 0700 In: 1568 [I.V.:460; NG/GT:990; IV Piggyback:118] Out: 75 [Urine:75]  Labs:  [facility] 08/26/11 0500 08/25/11 0526 08/24/11 0429  WBC 8.4 7.5 6.4  HGB 11.6* 11.4* 12.0  HCT 36.3 35.5* 37.3  PLT 69* 56* 63*  APTT -- -- --  CREATININE 2.88* 2.74* 3.26*  LABCREA -- -- --  CREATININE 2.88* 2.74* 3.26*  CREAT24HRUR -- -- --  MG 1.8 1.7 --  PHOS -- 4.5 --  ALBUMIN -- 1.6* --  PROT -- -- --  ALBUMIN -- 1.6* --  AST -- -- --  ALT -- -- --  ALKPHOS -- -- --  BILITOT -- -- --  BILIDIR -- -- --  IBILI -- -- --   Estimated Creatinine Clearance: 14.9 ml/min (by C-G formula based on Cr of 2.88).   Assessment: Was asked by neurology PA, Felicie Morn to follow seizure medications along with them for potential LFT interactions since these are metabolized via glucuronide conjugation and oxidation.  This 62 YOF with EEG and MRI suggestive of encephalopathy s/p elevated ventral hernia repair on 08/06/11 followed by sudden onset unresponsiveness and seizure like action on 08/12/11. Patient on Keppra PTA for similar episode in 2010. Keppra discontinued and IV Phenytoin started after her seizure activity on 6/17.  Neurology added Valproate with a 1gm load on 6/24 and maintenance of 500mg  twice daily.    6/19: DPH 13.6 post-load, corrects up to 20 6/24: DPH 10, albumin 2.7, corrected 15.6 6/25:  Valproic Acid Lvl 56.0 ug/ml which is lower end of goal 6/27: VPA 65.4 (goal 50-100); DPH 8.1 (corrects to 20 w/ renal/ low albumin) >> Mental status improved.  6/28: DPH 8.6  (corrects to 20 with low albumin and renal function)-Checked per Neurology.   6/26 LFTs: AST trending down. ALT stable. Tbili 0.4.    Goal of Therapy:  Phenytoin level 10-20 mcg/mL Valproate 50-100 mcg/ml  Plan:  - Continue Dilantin 100mg  IV Q8H- Free level ordered for 7/2 - Continue Valproate 500mg  IV Q12H- level in AM 7/2 - Monitor LFT's  Maneh Sieben K. Allena Katz, PharmD, BCPS.  Clinical Pharmacist Pager [phone]. 08/26/2011 3:48 PM

## 2011-08-26 NOTE — Progress Notes (Signed)
Assessment/Plan:  1. ARF on Chronic kidney disease stage IV: Baseline CKD, creatinine 2.74 today (dialyzed yesterday). She has vascular access in place. ARF due to ATN vs hypoperfusion. Oliguric. Dialysis in progress to remove fluid. 2. AMS/Seizure?PRES (MRI 18 Jun)- post ictal/sedated to Curently extubated. Given dilantin, provigil, and valproate. Neuro is following  3. Hypertension--BP 149/63 today  4. Anemia- on aranesp, Hgb stable 5. Metabolic bone disease: phos WNL, PTH pending  6. S/p ventral hernia repair with ileus-per [facility] 7. Hyperkalemia-resolved 8. Hypernatremia-resolved  9. VDRF-- Remove fluid in HD  10.Thrombocytopenia & Hx ITP (had been on low dose prednison PTA)  11. Resp failure due to decreased level of consciousness post seizure--per pulmonary, extubated now.  Subjective: Interval History: none. Minimally verbal  Objective: Vital signs in last 24 hours: Temp:  [97.3 F (36.3 C)-98.2 F (36.8 C)] 97.3 F (36.3 C) (07/01 0846) Pulse Rate:  [99-119] 119  (07/01 0915) Resp:  [7-21] 21  (07/01 0915) BP: (140-158)/(53-69) 141/62 mmHg (07/01 0915) SpO2:  [96 %-100 %] 100 % (07/01 0915) Weight:  [68.5 kg (151 lb 0.2 oz)] 68.5 kg (151 lb 0.2 oz) (07/01 0620) Weight change: 0.3 kg (10.6 oz)  Intake/Output from previous day: 06/30 0701 - 07/01 0700 In: 1568 [I.V.:460; NG/GT:990; IV Piggyback:118] Out: 75 [Urine:75] Intake/Output this shift: Total I/O In: 60 [I.V.:20; NG/GT:40] Out: -   General appearance: mild distress Neck: no adenopathy, no carotid bruit, no JVD, supple, symmetrical, trachea midline and thyroid not enlarged, symmetric, no tenderness/mass/nodules Weak appearing, Lung clear Cor RRR tachycardic Abd, soft, midline wound looks OK Ext 1+ BLE edema Awake, moves all extrem  Lab Results:  Basename 08/26/11 0500 08/25/11 0526  WBC 8.4 7.5  HGB 11.6* 11.4*  HCT 36.3 35.5*  PLT 69* 56*   BMET:  Basename 08/26/11 0500 08/25/11 0526  NA 141 141  K  4.1 4.0  CL 106 106  CO2 22 23  GLUCOSE 220* 213*  BUN 75* 60*  CREATININE 2.88* 2.74*  CALCIUM 8.0* 7.6*   No results found for this basename: PTH:2 in the last 72 hours Iron Studies: No results found for this basename: IRON,TIBC,TRANSFERRIN,FERRITIN in the last 72 hours Studies/Results: Dg Chest Port 1 View  08/26/2011  *RADIOLOGY REPORT*  Clinical Data: COPD, shortness of breath  PORTABLE CHEST - 1 VIEW  Comparison: 08/25/2011  Findings: Stable left lower lobe opacity, likely a combination of moderate pleural effusion and associated atelectasis, underlying pneumonia not excluded.  Stable left subclavian venous catheter.  Stable cardiomegaly.  Weighted feeding tube looped in the stomach and terminating in the gastric cardia.  IMPRESSION: Stable cardiomegaly with moderate left pleural effusion.  Underlying left lower lobe opacity, atelectasis versus pneumonia.  Original Report Authenticated By: Charline Bills, M.D.   Dg Chest Port 1 View  08/25/2011  *RADIOLOGY REPORT*  Clinical Data: Atelectasis  PORTABLE CHEST - 1 VIEW  Comparison: 08/23/2011  Findings: A feeding tube has been placed, tip not seen.  Left central venous catheter stable position.  Low lung volumes. Probable layering left pleural effusion with significant adjacent atelectasis/consolidation at the left lung base.  Heart size upper limits normal.  Tortuous thoracic aorta.  IMPRESSION:  1.  Interval placement of feeding tube. 2.  Persistent left pleural effusion and left lower lung opacities.  Original Report Authenticated By: Osa Craver, M.D.   Meds reviewed   LOS: 20 days   Kayzen Kendzierski C 08/26/2011,9:18 AM

## 2011-08-26 NOTE — Progress Notes (Signed)
Physical Therapy Cancellation Note: pt currently receiving HD and unable to participate. Will attempt in pm as time allows. Thanks [name], PT [phone]

## 2011-08-26 NOTE — Progress Notes (Signed)
Discussed with RN, pt still not adequately alert for PO intake. Will f/u tomorrow. Harlon Ditty, MA CCC-SLP [phone]

## 2011-08-26 NOTE — Procedures (Signed)
Central Venous Catheter Insertion Procedure Note PRESTINA RAIGOZA [id number] 05-18-1932  Procedure: Insertion of Central Venous Catheter Indications: Drug and/or fluid administration  Procedure Details Consent: Risks of procedure as well as the alternatives and risks of each were explained to the (patient/caregiver).  Consent for procedure obtained. Time Out: Verified patient identification, verified procedure, site/side was marked, verified correct patient position, special equipment/implants available, medications/allergies/relevent history reviewed, required imaging and test results available.  Performed  Maximum sterile technique was used including antiseptics, cap, gloves, gown, hand hygiene, mask and sheet. Skin prep: Chlorhexidine; local anesthetic administered at suture site only A antimicrobial bonded/coated triple lumen catheter was placed in the left subclavian vein using the Seldinger technique after over wire technique, removal old, then re sterile then new line  Evaluation Blood flow good Complications: No apparent complications Patient did tolerate procedure well. Chest X-ray ordered to verify placement.  CXR: pending.  Nelda Bucks 08/26/2011, 5:14 PM

## 2011-08-26 NOTE — Progress Notes (Signed)
Pt profile: 79 yowf with PMH of DM, CKD, Htn, ITP (chronic low dose pred) and one prior seizure with negative EEG. Underwent ventral hernia repair 6/11. Suffered tonic-clonic seizure 6/17. Required urgent HD 6/22 for hyperkalemia. Suffered recurrent tonic clonic seizures 6/24 requiring 4 mg Ativan IV. Intubated for depressed LOC. PCCM assumed care 6/24  Active problems: Principal Problem:  *Acute respiratory failure Active Problems:  Thrombocytopenia  Acute on chronic renal insufficiency  Anemia of renal disease  Recurrent incisional hernia with incarceration  Seizure, convulsive  Hyperkalemia  Encephalopathy acute  Hypotension  Status post laparotomy  Ileus following gastrointestinal surgery  Candiduria  History of ITP   Subj: HD on going 7/1  Obj: Filed Vitals:   08/26/11 1000  BP: 120/61  Pulse: 118  Temp:   Resp: 18    Gen: chronically ill appearing, no distress, Very debilitated HEENT: WNL Neck: JVP not visualized, no LAN Chest: reduced Cardiac: RRR s M Abd: Soft, diminished BS, ecchymosis over RLQ to R flank - NSC Ext: trace symmetric LE edema Neuro: RASS -1   Dg Chest Port 1 View  08/26/2011  *RADIOLOGY REPORT*  Clinical Data: COPD, shortness of breath  PORTABLE CHEST - 1 VIEW  Comparison: 08/25/2011  Findings: Stable left lower lobe opacity, likely a combination of moderate pleural effusion and associated atelectasis, underlying pneumonia not excluded.  Stable left subclavian venous catheter.  Stable cardiomegaly.  Weighted feeding tube looped in the stomach and terminating in the gastric cardia.  IMPRESSION: Stable cardiomegaly with moderate left pleural effusion.  Underlying left lower lobe opacity, atelectasis versus pneumonia.  Original Report Authenticated By: Charline Bills, M.D.   Dg Chest Port 1 View  08/25/2011  *RADIOLOGY REPORT*  Clinical Data: Atelectasis  PORTABLE CHEST - 1 VIEW  Comparison: 08/23/2011  Findings: A feeding tube has been placed, tip  not seen.  Left central venous catheter stable position.  Low lung volumes. Probable layering left pleural effusion with significant adjacent atelectasis/consolidation at the left lung base.  Heart size upper limits normal.  Tortuous thoracic aorta.  IMPRESSION:  1.  Interval placement of feeding tube. 2.  Persistent left pleural effusion and left lower lung opacities.  Original Report Authenticated By: Osa Craver, M.D.    Lab 08/26/11 0500 08/25/11 0526 08/24/11 0429  NA 141 141 146*  K 4.1 4.0 3.8  CL 106 106 112  CO2 22 23 17*  BUN 75* 60* 74*  CREATININE 2.88* 2.74* 3.26*  GLUCOSE 220* 213* 222*    Lab 08/26/11 0500 08/25/11 0526 08/24/11 0429  HGB 11.6* 11.4* 12.0  HCT 36.3 35.5* 37.3  WBC 8.4 7.5 6.4  PLT 69* 56* 63*        IMPRESSION/PLAN:   Acute respiratory failure due to decreased LOC post seizure - Extubated 6/27. Post extubation care implemented  - no distress -pcxr with LLL haziness, continue neg  Balance, likely layering effusion   Hypotension - resolved Mild tachy worse on HD -add home beta blcoker metoprolol   PRES (per MRI brain 6/18)  Seizure, convulsive  Encephalopathy acute - Anticonvulsants per Neuro -PT consulted -chair position -treat underlying metabolic issues Add moon boots   Acute on chronic renal insufficiency Lab Results  Component Value Date   CREATININE 2.88* 08/26/2011   CREATININE 2.74* 08/25/2011   CREATININE 3.26* 08/24/2011  6/29  Dc lasix and monitor renal function 6/30 note trend of creatine down   Hyperkalemia - resolved Hypernatremia -resolved 6/30  Lab 08/26/11 0500 08/25/11 0526  08/24/11 0429  K 4.1 4.0 3.8    - hd now on going, for neg balance again likely   Anemia of renal disease  Basename 08/26/11 0500 08/25/11 0526  HGB 11.6* 11.4*    Thrombocytopenia, h/o ITP on baseline dose of Pred .pbplt - No indication for RBCs or platelets presently - LMWH stopped 6/24 - SCDs for DVT prophy - Changed  hydrocortisone to solumedrol 6/27, continue this at current dosage of equivalent pred in am , follow plat trend    Recurrent incisional hernia with incarceration  Ileus following gastrointestinal surgery - resolved  Status post laparotomy  Diarrhea - Post op mgmt per [facility]CS - NPO after extubation. Panda tube placed 6/28 - TFs tolerated - Stool for C diff NEG - Hemoccult NEG  Candiduria  - Fluconazole ordered 6/26 > 7/1 (planned), repeat UA if can obtain  Failure to Thrive: Support as needed Requires SDu still  To  Mcarthur Rossetti. Tyson Alias, MD, FACP Pgr: [phone] [facility]

## 2011-08-26 NOTE — Progress Notes (Addendum)
[facility] Progress Note 20 Days Post-Op  Subjective: The patient is arousable, not very alert.  responsive  Objective: Vital signs in last 24 hours: Temp:  [97.9 F (36.6 C)-98.2 F (36.8 C)] 98.2 F (36.8 C) (07/01 0620) Pulse Rate:  [95-111] 107  (07/01 0745) Resp:  [7-18] 14  (07/01 0745) BP: (135-158)/(53-65) 149/65 mmHg (07/01 0745) SpO2:  [96 %-100 %] 99 % (07/01 0745) Weight:  [68.5 kg (151 lb 0.2 oz)] 68.5 kg (151 lb 0.2 oz) (07/01 0620) Last BM Date: 08/24/11  Intake/Output from previous day: 06/30 0701 - 07/01 0700 In: 1568 [I.V.:460; NG/GT:990; IV Piggyback:118] Out: 75 [Urine:75] Intake/Output this shift:    General: No acute distress  Lungs: Clear  Abd: Soft, good bowel sounds, mildly tender, tolerating tube feedings well.  Having some diarrhea.    Extremities: No change  Neuro: Slow, responsive.  Intact  Lab Results:  @LABLAST2 (wbc:2,hgb:2,hct:2,plt:2) BMET  Basename 08/26/11 0500 08/25/11 0526  NA 141 141  K 4.1 4.0  CL 106 106  CO2 22 23  GLUCOSE 220* 213*  BUN 75* 60*  CREATININE 2.88* 2.74*  CALCIUM 8.0* 7.6*   PT/INR No results found for this basename: LABPROT:2,INR:2 in the last 72 hours ABG  Basename 08/24/11 1321  PHART 7.319*  HCO3 18.7*    Studies/Results: Dg Chest Port 1 View  08/26/2011  *RADIOLOGY REPORT*  Clinical Data: COPD, shortness of breath  PORTABLE CHEST - 1 VIEW  Comparison: 08/25/2011  Findings: Stable left lower lobe opacity, likely a combination of moderate pleural effusion and associated atelectasis, underlying pneumonia not excluded.  Stable left subclavian venous catheter.  Stable cardiomegaly.  Weighted feeding tube looped in the stomach and terminating in the gastric cardia.  IMPRESSION: Stable cardiomegaly with moderate left pleural effusion.  Underlying left lower lobe opacity, atelectasis versus pneumonia.  Original Report Authenticated By: Charline Bills, M.D.   Dg Chest Port 1 View  08/25/2011  *RADIOLOGY  REPORT*  Clinical Data: Atelectasis  PORTABLE CHEST - 1 VIEW  Comparison: 08/23/2011  Findings: A feeding tube has been placed, tip not seen.  Left central venous catheter stable position.  Low lung volumes. Probable layering left pleural effusion with significant adjacent atelectasis/consolidation at the left lung base.  Heart size upper limits normal.  Tortuous thoracic aorta.  IMPRESSION:  1.  Interval placement of feeding tube. 2.  Persistent left pleural effusion and left lower lung opacities.  Original Report Authenticated By: Osa Craver, M.D.    Anti-infectives: Anti-infectives     Start     Dose/Rate Route Frequency Ordered Stop   08/23/11 1100   fluconazole (DIFLUCAN) 40 MG/ML suspension 200 mg        200 mg Per Tube Daily 08/23/11 0955 08/25/11 1000   08/21/11 1200   fluconazole (DIFLUCAN) IVPB 200 mg  Status:  Discontinued        200 mg 100 mL/hr over 60 Minutes Intravenous Every 24 hours 08/21/11 1101 08/23/11 0955   08/19/11 1400   vancomycin (VANCOCIN) IVPB 1000 mg/200 mL premix  Status:  Discontinued        1,000 mg 200 mL/hr over 60 Minutes Intravenous Every 48 hours 08/19/11 1257 08/21/11 1054   08/19/11 1330   piperacillin-tazobactam (ZOSYN) IVPB 2.25 g  Status:  Discontinued        2.25 g 100 mL/hr over 30 Minutes Intravenous 3 times per day 08/19/11 1257 08/21/11 1054   08/14/11 1800   cefTRIAXone (ROCEPHIN) 1 g in dextrose 5 % 50  mL IVPB  Status:  Discontinued        1 g 100 mL/hr over 30 Minutes Intravenous Every 24 hours 08/14/11 1706 08/16/11 1354   08/06/11 1400   ceFAZolin (ANCEF) IVPB 1 g/50 mL premix  Status:  Discontinued        1 g 100 mL/hr over 30 Minutes Intravenous 60 min pre-op 08/06/11 1313 08/06/11 1800          Assessment/Plan: s/p Procedure(s): HERNIA REPAIR VENTRAL ADULT We are not contributing much to her ongoing care.  We will sign off until reconsulted.  LOS: 20 days   Marta Lamas. Gae Bon, MD,  FACS [phone] [facility] 08/26/2011  Addendum:  Seeing that we were the admitting service, we will continue to follow this patient peripherally, but her problems are mostly medical.  Marta Lamas. Gae Bon, MD, FACS [phone] [facility]

## 2011-08-26 NOTE — Progress Notes (Signed)
Physical Therapy Treatment Patient Details Name: [name] MRN: [id number] DOB: 1933-02-13 Today's Date: 08/26/2011 Time: 1478-2956 PT Time Calculation (min): 22 min  PT Assessment / Plan / Recommendation Comments on Treatment Session  Pt continues to demonstrate lethargy and decreased arousal after hernia repair, seizures and VDRF. Pt continues to have edema all 4 extremities with bil UE weeping. Pt with increased arousal and participation from prior tx with abillity to reach with bil UE toward face but unable to complete full ROM without assist. Pt demonstrated movement of bil LE today although still limited. Pt arousal and mental status largest limiiting factor in progression. Will continue to follow.     Follow Up Recommendations  LTACH    Barriers to Discharge        Equipment Recommendations  Defer to next venue    Recommendations for Other Services OT consult  Frequency Min 2X/week   Plan Discharge plan needs to be updated;Frequency needs to be updated    Precautions / Restrictions Precautions Precautions: Fall   Pertinent Vitals/Pain Pt without signs of pain Vitals stable    Mobility  Bed Mobility Bed Mobility: Supine to Sit;Sit to Supine;Sitting - Scoot to Edge of Bed Supine to Sit: 1: +2 Total assist;HOB elevated (HOB 30degrees) Supine to Sit: Patient Percentage: 0% Sitting - Scoot to Edge of Bed: 1: +1 Total assist Sit to Supine: 1: +2 Total assist;HOB flat Sit to Supine: Patient Percentage: 0% Scooting to HOB: 1: +2 Total assist Scooting to [facility]: Patient Percentage: 0% Transfers Transfers: Not assessed Ambulation/Gait Ambulation/Gait Assistance: Not tested (comment)    Exercises General Exercises - Lower Extremity Short Arc Quad: AROM;Left;5 reps;Supine;AAROM;Both;Seated (AArom bil 5 EOB with activation with cueing, 5 on left AROM)   PT Diagnosis:    PT Problem List:   PT Treatment Interventions:     PT Goals Acute Rehab PT Goals PT Goal: Supine/Side to  Sit - Progress: Not progressing PT Goal: Sit at Edge Of Bed - Progress: Not progressing PT Goal: Sit to Supine/Side - Progress: Not progressing PT Goal: Sit to Stand - Progress: Not progressing PT Goal: Stand to Sit - Progress: Not progressing PT Transfer Goal: Bed to Chair/Chair to Bed - Progress: Not progressing  Visit Information  Last PT Received On: 08/26/11 Assistance Needed: +2    Subjective Data  Subjective: i think it feels better   Cognition  Overall Cognitive Status: Impaired Area of Impairment: Attention;Following commands Arousal/Alertness: Lethargic Behavior During Session: Lethargic Current Attention Level: Focused Following Commands: Follows one step commands inconsistently Cognition - Other Comments: Pt with increased arousal from evaluation but continues to be limited to about 30sec at a time for arousal with focused attention for seconds at a time.     Balance  Static Sitting Balance Static Sitting - Balance Support: Feet supported;No upper extremity supported Static Sitting - Level of Assistance: 1: +1 Total assist Static Sitting - Comment/# of Minutes: Pt EOB 4 min with flexed neck and trunk throughout and required total assist to extend neck and look up but unable to maintain. No change in arousal with EOB vs supine.  End of Session PT - End of Session Activity Tolerance: Patient limited by fatigue;Other (comment) (limited by lethargy and decreased arousal) Patient left: in bed Nurse Communication: Mobility status   GP     Delorse Lek 08/26/2011, 3:41 PM [name], PT [phone]

## 2011-08-31 NOTE — Discharge Summary (Signed)
Agree with above. Improving slowly. Surgery signed off.   Mcarthur Rossetti. Tyson Alias, MD, FACP Pgr: [phone] [facility]

## 2011-09-18 ENCOUNTER — Other Ambulatory Visit: Payer: Medicare Other | Admitting: Lab

## 2011-09-18 ENCOUNTER — Ambulatory Visit: Payer: Medicare Other

## 2011-10-23 ENCOUNTER — Ambulatory Visit: Payer: Medicare Other

## 2011-10-23 ENCOUNTER — Other Ambulatory Visit: Payer: Medicare Other

## 2011-10-23 ENCOUNTER — Telehealth: Payer: Self-pay | Admitting: *Deleted

## 2011-10-23 ENCOUNTER — Telehealth: Payer: Self-pay | Admitting: Oncology

## 2011-10-23 ENCOUNTER — Ambulatory Visit: Payer: Medicare Other | Admitting: Oncology

## 2011-10-23 NOTE — Telephone Encounter (Signed)
S/w pt today re new appt for 9/6 - d/t per pt. R/s from 8/28 per pt.

## 2011-10-23 NOTE — Telephone Encounter (Signed)
Called sister-Nellie about patient and what was going on.  Patient missed appointment for lab, NP, and injection appointment.  Sister said that she probably just forgot.  She will contact patient about calling back and rescheduling.  Will let K. Curcio, NP about this.

## 2011-11-01 ENCOUNTER — Telehealth: Payer: Self-pay | Admitting: Oncology

## 2011-11-01 ENCOUNTER — Other Ambulatory Visit (HOSPITAL_BASED_OUTPATIENT_CLINIC_OR_DEPARTMENT_OTHER): Payer: Medicare Other | Admitting: Lab

## 2011-11-01 ENCOUNTER — Ambulatory Visit (HOSPITAL_BASED_OUTPATIENT_CLINIC_OR_DEPARTMENT_OTHER): Payer: Medicare Other | Admitting: Oncology

## 2011-11-01 ENCOUNTER — Encounter: Payer: Self-pay | Admitting: Oncology

## 2011-11-01 VITALS — BP 166/63 | HR 68 | Temp 97.4°F | Resp 18

## 2011-11-01 DIAGNOSIS — D696 Thrombocytopenia, unspecified: Secondary | ICD-10-CM

## 2011-11-01 DIAGNOSIS — D631 Anemia in chronic kidney disease: Secondary | ICD-10-CM

## 2011-11-01 DIAGNOSIS — N189 Chronic kidney disease, unspecified: Secondary | ICD-10-CM

## 2011-11-01 DIAGNOSIS — N186 End stage renal disease: Secondary | ICD-10-CM

## 2011-11-01 DIAGNOSIS — D519 Vitamin B12 deficiency anemia, unspecified: Secondary | ICD-10-CM

## 2011-11-01 DIAGNOSIS — D518 Other vitamin B12 deficiency anemias: Secondary | ICD-10-CM

## 2011-11-01 DIAGNOSIS — E538 Deficiency of other specified B group vitamins: Secondary | ICD-10-CM

## 2011-11-01 LAB — CBC WITH DIFFERENTIAL/PLATELET
BASO%: 0.8 % (ref 0.0–2.0)
EOS%: 1.4 % (ref 0.0–7.0)
MCH: 31.5 pg (ref 25.1–34.0)
MCHC: 33.5 g/dL (ref 31.5–36.0)
MCV: 93.9 fL (ref 79.5–101.0)
MONO%: 12 % (ref 0.0–14.0)
NEUT%: 55.8 % (ref 38.4–76.8)
RDW: 13.9 % (ref 11.2–14.5)
lymph#: 1.6 10*3/uL (ref 0.9–3.3)

## 2011-11-01 LAB — COMPREHENSIVE METABOLIC PANEL (CC13)
ALT: 8 U/L (ref 0–55)
AST: 14 U/L (ref 5–34)
Alkaline Phosphatase: 86 U/L (ref 40–150)
CO2: 24 mEq/L (ref 22–29)
Sodium: 136 mEq/L (ref 136–145)
Total Bilirubin: 0.3 mg/dL (ref 0.20–1.20)
Total Protein: 5.6 g/dL — ABNORMAL LOW (ref 6.4–8.3)

## 2011-11-01 LAB — VITAMIN B12: Vitamin B-12: 936 pg/mL — ABNORMAL HIGH (ref 211–911)

## 2011-11-01 MED ORDER — DARBEPOETIN ALFA-POLYSORBATE 500 MCG/ML IJ SOLN
300.0000 ug | Freq: Once | INTRAMUSCULAR | Status: AC
  Administered 2011-11-01: 300 ug via SUBCUTANEOUS
  Filled 2011-11-01: qty 1

## 2011-11-01 MED ORDER — CYANOCOBALAMIN 1000 MCG/ML IJ SOLN
1000.0000 ug | INTRAMUSCULAR | Status: DC
  Administered 2011-11-01: 1000 ug via SUBCUTANEOUS

## 2011-11-01 NOTE — Telephone Encounter (Signed)
Gave pt appt for September 2013 and January 2013 lab and MD with injections

## 2011-11-01 NOTE — Progress Notes (Signed)
[facility]  Telephone:([phone] [phone]   OFFICE PROGRESS NOTE   Cc:  BURNETT,[name], MD  DIAGNOSIS:   Anemia of chronic kidney disease and vitamin B12 deficiency, thrombocytopenia from presumed immune mediated process.  COMORBIDITIES:   1. Diabetes type 2.  2. Hypertension.  3. Hyperlipidemia.  4. Osteoarthritis.  5. Chronic kidney disease with 24-hour urine collection 01/26/2008 with creatinine clearance of 38 mL/minute.  CURRENT THERAPY:  Aranesp 300 mcg subcu every 4 weeks for hemoglobin less than 11 and vitamin B12 1000 mcg subcu monthly.  [name] also on chronic low-dose prednisone for ITP.  INTERVAL HISTORY: [name] 78 y.o. female returns for regular follow up.  Since we last saw Ms Damman in May 2013, she was hospitalized for a hernia repair. She had an extensive hospitalization followed by rehabilitation at [facility]. She has been home for about 2 weeks now. She continues to get PT in her home. Using a walker and getting stronger. She has bruises in the legs but no active bleeding.  She still makes urine.  She had left arm fistula placed; however, she has not had to started HD yet.  She sees Dr. Eliott Nine.  She denies metallic taste or severe SOB or DOE, confusion. She has ongoing pedal edema. The patient states that she received her B12 injections monthly, but has not received any Aranesp since April 2013.  Patient denies headache, visual changes, drenching night sweats, palpable lymph node swelling, mucositis, odynophagia, dysphagia, nausea vomiting, jaundice, chest pain, palpitation, productive cough, gum bleeding, epistaxis, hematemesis, hemoptysis, abdominal pain, abdominal swelling, early satiety, melena, hematochezia, hematuria, skin rash, spontaneous bleeding, joint swelling, joint pain, heat or cold intolerance, bowel bladder incontinence, back pain, focal motor weakness, paresthesia, depression, suicidal or homocidal ideation, feeling  hopelessness.   Past Medical History  Diagnosis Date  . Hypertension   . Anxiety and depression   . Chronic headaches   . Diverticular disease   . Anemia   . Arthritis     osteoarthritis  . Osteoporosis   . Vitamin B 12 deficiency   . History of ITP   . Anxiety     takes xanax for sleep  . Diabetes mellitus     NIDDM x 12 years  . Heart murmur     benign;asymptomatic  . Chronic kidney disease     esrd    Past Surgical History  Procedure Date  . Cholecystectomy   . Av fistula placement 11/15/10    left upper arm AVF  . Abdominal hysterectomy 1973  . Joint replacement 2007    rt hip  . Eye surgery   . Cataract extraction w/ intraocular lens  implant, bilateral 2002  . Vascular surgery 10-2010    left AVF  . Hernia repair 2000    ventral  . Ventral hernia repair 08/06/2011    Procedure: HERNIA REPAIR VENTRAL ADULT;  Surgeon: Liz Malady, MD;  Location: [facility] OR;  Service: General;  Laterality: N/A;  Open Repair REcurrent Inc. Hernia with Mesh    Current Outpatient Prescriptions  Medication Sig Dispense Refill  . acetaminophen (TYLENOL) 500 MG tablet Take 500 mg by mouth 2 (two) times daily as needed. For pain.      Marland Kitchen albuterol (PROVENTIL) (5 MG/ML) 0.5% nebulizer solution Take 0.5 mLs (2.5 mg total) by nebulization every 6 (six) hours.  20 mL    . alprazolam (XANAX) 2 MG tablet Take 1-2 mg by mouth at bedtime as needed. For  sleep.       . antiseptic oral rinse (BIOTENE) LIQD 15 mLs by Mouth Rinse route QID.      Marland Kitchen atenolol (TENORMIN) 25 MG tablet Take 25 mg by mouth 2 (two) times daily.       . cloNIDine (CATAPRES) 0.1 MG tablet Take 0.1 mg by mouth 2 (two) times daily.        . cyanocobalamin (,VITAMIN B-12,) 1000 MCG/ML injection Inject 1,000 mcg into the muscle every 30 (thirty) days.       . darbepoetin (ARANESP) 150 MCG/0.3ML SOLN Inject 150 mcg into the skin every 30 (thirty) days. As needed. Only receives if blood test is low      . furosemide (LASIX) 20 MG  tablet Take 20 mg by mouth daily.       . insulin aspart (NOVOLOG) 100 UNIT/ML injection Inject 0-9 Units into the skin every 4 (four) hours.  1 vial    . ipratropium (ATROVENT) 0.02 % nebulizer solution Take 2.5 mLs (0.5 mg total) by nebulization every 6 (six) hours.  75 mL    . methylPREDNISolone sodium succinate (SOLU-MEDROL) 40 mg/mL injection Inject 1 mL (40 mg total) into the vein every 12 (twelve) hours.  1 each    . metoprolol tartrate (LOPRESSOR) 25 mg/10 mL SUSP Place 5 mLs (12.5 mg total) into feeding tube 2 (two) times daily.      . modafinil (PROVIGIL) 200 MG tablet Place 1 tablet (200 mg total) into feeding tube daily.      . Nutritional Supplements (FEEDING SUPPLEMENT, NEPRO CARB STEADY,) LIQD Place 1,000 mLs into feeding tube daily.      Marland Kitchen oxybutynin (DITROPAN) 5 MG tablet Take 5 mg by mouth daily.       . phenytoin (DILANTIN) 125 MG/5ML suspension Take 4 mLs (100 mg total) by mouth 3 (three) times daily.  237 mL    . pioglitazone (ACTOS) 30 MG tablet Take 30 mg by mouth daily.       . simvastatin (ZOCOR) 40 MG tablet Take 40 mg by mouth at bedtime.        . sodium chloride 0.9 % infusion Inject 250 mLs into the vein as needed (if IV carrier fluid needed.).       Current Facility-Administered Medications  Medication Dose Route Frequency Provider Last Rate Last Dose  . cyanocobalamin ((VITAMIN B-12)) injection 1,000 mcg  1,000 mcg Subcutaneous Q30 days Myrtis Ser, NP   1,000 mcg at 11/01/11 1525  . darbepoetin alfa-polysorbate (ARANESP) injection 300 mcg  300 mcg Subcutaneous Once Myrtis Ser, NP   300 mcg at 11/01/11 1524    ALLERGIES:   has no known allergies.  REVIEW OF SYSTEMS:  The rest of the 14-point review of system was negative.   Filed Vitals:   11/01/11 1442  BP: 166/63  Pulse: 68  Temp: 97.4 F (36.3 C)  Resp: 18   Wt Readings from Last 3 Encounters:  08/26/11 143 lb 15.4 oz (65.3 kg)  08/06/11 132 lb (59.875 kg)  08/26/11 143 lb 15.4 oz (65.3  kg)   ECOG Performance status: 3  PHYSICAL EXAMINATION:   General:  well-nourished woman, chronically tired appearing, in no acute distress.  Eyes:  no scleral icterus.  ENT:  There were no oropharyngeal lesions.  Neck was without thyromegaly.  Lymphatics:  Negative cervical, supraclavicular or axillary adenopathy.  Respiratory: lungs were clear bilaterally without wheezing or crackles.  Cardiovascular:  Regular rate and rhythm, S1/S2, without murmur, rub  or gallop.  There was trace bilateral pedal edema.  GI:  abdomen was soft, flat, nontender, nondistended, without organomegaly.  Muscoloskeletal:  no spinal tenderness of palpation of vertebral spine.  Skin exam was without  petichae.  There were echymosis on the bilateral shins. Neuro exam was nonfocal.  Patient was alerted and oriented.  Attention was good.   Language was appropriate.  Mood was normal without depression.  Speech was not pressured.  Thought content was not tangential.      LABORATORY/RADIOLOGY DATA:  Lab Results  Component Value Date   WBC 5.2 11/01/2011   HGB 10.3* 11/01/2011   HCT 30.7* 11/01/2011   PLT 140* 11/01/2011   GLUCOSE 220* 08/26/2011   CHOL  Value: 182        ATP III CLASSIFICATION:  <200     mg/dL   Desirable  469-629  mg/dL   Borderline High  >=528    mg/dL   High        41/04/2438   TRIG 85 02/01/2009   HDL 106 02/01/2009   LDLCALC  Value: 59        Total Cholesterol/HDL:CHD Risk Coronary Heart Disease Risk Table                     Men   Women  1/2 Average Risk   3.4   3.3  Average Risk       5.0   4.4  2 X Average Risk   9.6   7.1  3 X Average Risk  23.4   11.0        Use the calculated Patient Ratio above and the CHD Risk Table to determine the patient's CHD Risk.        ATP III CLASSIFICATION (LDL):  <100     mg/dL   Optimal  102-725  mg/dL   Near or Above                    Optimal  130-159  mg/dL   Borderline  366-440  mg/dL   High  >347     mg/dL   Very High 42/06/9561   ALKPHOS 64 08/21/2011   ALT 90* 08/21/2011    AST 120* 08/21/2011   NA 141 08/26/2011   K 4.1 08/26/2011   CL 106 08/26/2011   CREATININE 2.88* 08/26/2011   BUN 75* 08/26/2011   CO2 22 08/26/2011   INR 1.05 08/19/2011   HGBA1C  Value: 7.1 (NOTE) The ADA recommends the following therapeutic goal for glycemic control related to Hgb A1c measurement: Goal of therapy: <6.5 Hgb A1c  Reference: American Diabetes Association: Clinical Practice Recommendations 2010, Diabetes Care, 2010, 33: (Suppl  1).* 05/22/2009      ASSESSMENT AND PLAN:    1. History of Vit B12 deficiency.  She continues to receive monthly vitamin B12 1000 mcg IM.  2. History of renal insufficiency secondary to most likely diabetes and hypertension.  [name] following up with Dr. Eliott Nine again to see whether she will continue observation or start HD.  3. Renal insufficiency induced chronic anemia.  She has been receiving Aranesp 300 mcg subcu every 4 weeks for hemoglobin less than 11.  This has helped her to needing pRBC transfusion.  4. Thrombocytopenia from presumed ITP.  She continues on prednisone 10 mg.  Her plt is relatively stable.  She has no obvious bleeding.  5. Diabetes type 2.  [name] on pioglitazone per PCP.   6.  Hyperlipidemia.  [name] on simvastatin per PCP. 7. Hypertension.  [name] on atenolol, clonidine, Lasix per PCP.  Her BP was still high today.  I will defer to her PCP for titration of her BP meds.  8. Fatigue:  Most likely due to renal disease and co-morbidities.  Her fatigue is not due to her mild anemia today.  9. Follow-up:  In about 3-4 months.  Monthly CBC and VitB12 injection (regardless of Hgb) and Arenesp injection (for Hgb <11).     The length of time of the face-to-face encounter was 15 minutes. More than 50% of time was spent counseling and coordination of care.

## 2011-11-19 ENCOUNTER — Other Ambulatory Visit: Payer: Self-pay | Admitting: Family Medicine

## 2011-11-19 DIAGNOSIS — Z1231 Encounter for screening mammogram for malignant neoplasm of breast: Secondary | ICD-10-CM

## 2011-11-19 DIAGNOSIS — M810 Age-related osteoporosis without current pathological fracture: Secondary | ICD-10-CM

## 2011-11-20 ENCOUNTER — Other Ambulatory Visit (HOSPITAL_BASED_OUTPATIENT_CLINIC_OR_DEPARTMENT_OTHER): Payer: Medicare Other

## 2011-11-20 ENCOUNTER — Other Ambulatory Visit: Payer: Self-pay | Admitting: *Deleted

## 2011-11-20 ENCOUNTER — Telehealth: Payer: Self-pay | Admitting: Oncology

## 2011-11-20 ENCOUNTER — Ambulatory Visit: Payer: Medicare Other

## 2011-11-20 DIAGNOSIS — N189 Chronic kidney disease, unspecified: Secondary | ICD-10-CM

## 2011-11-20 DIAGNOSIS — D631 Anemia in chronic kidney disease: Secondary | ICD-10-CM

## 2011-11-20 LAB — CBC WITH DIFFERENTIAL/PLATELET
BASO%: 1.4 % (ref 0.0–2.0)
HCT: 32.3 % — ABNORMAL LOW (ref 34.8–46.6)
LYMPH%: 30.5 % (ref 14.0–49.7)
MCH: 30.4 pg (ref 25.1–34.0)
MCHC: 32.3 g/dL (ref 31.5–36.0)
MCV: 94.1 fL (ref 79.5–101.0)
MONO%: 15.7 % — ABNORMAL HIGH (ref 0.0–14.0)
NEUT%: 50.2 % (ref 38.4–76.8)
Platelets: 104 10*3/uL — ABNORMAL LOW (ref 145–400)
RBC: 3.43 10*6/uL — ABNORMAL LOW (ref 3.70–5.45)

## 2011-11-20 NOTE — Telephone Encounter (Signed)
gve the pt's dtr the oct-jan 2014 appt calendar

## 2011-11-20 NOTE — Progress Notes (Signed)
Patient here for injections.  It is too early for injections.  Due every four weeks, last ones given on 11/01/2011, so not due til 11/29/2011.  Patient and daughter notified and taken to scheduling to reschedule next appointments.  Orders are on the pof from 11/01/11.

## 2011-11-25 ENCOUNTER — Telehealth: Payer: Self-pay | Admitting: Oncology

## 2011-11-25 NOTE — Telephone Encounter (Signed)
Per 9/6/pof the pt is supposed to get her labs and injection appts every 4 weeks. Corrected the schedule and s/w the pt's dtr and she is aware of this and she will be here this Friday to pick up an updated appt calendars for oct-jan 2014. For the dec appts the injection appt was added for the same day as the md visit.

## 2011-11-29 ENCOUNTER — Other Ambulatory Visit (HOSPITAL_BASED_OUTPATIENT_CLINIC_OR_DEPARTMENT_OTHER): Payer: Medicare Other | Admitting: Lab

## 2011-11-29 ENCOUNTER — Ambulatory Visit (HOSPITAL_BASED_OUTPATIENT_CLINIC_OR_DEPARTMENT_OTHER): Payer: Medicare Other

## 2011-11-29 VITALS — BP 127/57 | HR 57 | Temp 97.1°F

## 2011-11-29 DIAGNOSIS — D519 Vitamin B12 deficiency anemia, unspecified: Secondary | ICD-10-CM

## 2011-11-29 DIAGNOSIS — D631 Anemia in chronic kidney disease: Secondary | ICD-10-CM

## 2011-11-29 DIAGNOSIS — N189 Chronic kidney disease, unspecified: Secondary | ICD-10-CM

## 2011-11-29 DIAGNOSIS — N039 Chronic nephritic syndrome with unspecified morphologic changes: Secondary | ICD-10-CM

## 2011-11-29 DIAGNOSIS — E538 Deficiency of other specified B group vitamins: Secondary | ICD-10-CM

## 2011-11-29 LAB — CBC WITH DIFFERENTIAL/PLATELET
BASO%: 0.9 % (ref 0.0–2.0)
Basophils Absolute: 0 10*3/uL (ref 0.0–0.1)
EOS%: 0.8 % (ref 0.0–7.0)
Eosinophils Absolute: 0 10*3/uL (ref 0.0–0.5)
HCT: 30.5 % — ABNORMAL LOW (ref 34.8–46.6)
HGB: 10 g/dL — ABNORMAL LOW (ref 11.6–15.9)
LYMPH%: 47.4 % (ref 14.0–49.7)
MCH: 30.3 pg (ref 25.1–34.0)
MCHC: 32.7 g/dL (ref 31.5–36.0)
MCV: 92.7 fL (ref 79.5–101.0)
MONO#: 0.4 10*3/uL (ref 0.1–0.9)
MONO%: 11.8 % (ref 0.0–14.0)
NEUT#: 1.4 10*3/uL — ABNORMAL LOW (ref 1.5–6.5)
NEUT%: 39.1 % (ref 38.4–76.8)
Platelets: 87 10*3/uL — ABNORMAL LOW (ref 145–400)
RBC: 3.29 10*6/uL — ABNORMAL LOW (ref 3.70–5.45)
RDW: 14.7 % — ABNORMAL HIGH (ref 11.2–14.5)
WBC: 3.5 10*3/uL — ABNORMAL LOW (ref 3.9–10.3)
lymph#: 1.7 10*3/uL (ref 0.9–3.3)

## 2011-11-29 MED ORDER — CYANOCOBALAMIN 1000 MCG/ML IJ SOLN
1000.0000 ug | INTRAMUSCULAR | Status: DC
  Administered 2011-11-29: 1000 ug via SUBCUTANEOUS

## 2011-11-29 MED ORDER — DARBEPOETIN ALFA-POLYSORBATE 300 MCG/0.6ML IJ SOLN
300.0000 ug | Freq: Once | INTRAMUSCULAR | Status: AC
  Administered 2011-11-29: 300 ug via SUBCUTANEOUS
  Filled 2011-11-29: qty 1

## 2011-12-11 ENCOUNTER — Ambulatory Visit: Payer: Medicare Other

## 2011-12-11 ENCOUNTER — Other Ambulatory Visit: Payer: Medicare Other | Admitting: Lab

## 2011-12-18 ENCOUNTER — Ambulatory Visit: Payer: Medicare Other

## 2011-12-18 ENCOUNTER — Other Ambulatory Visit: Payer: Medicare Other | Admitting: Lab

## 2011-12-18 ENCOUNTER — Other Ambulatory Visit: Payer: Medicare Other

## 2011-12-27 ENCOUNTER — Ambulatory Visit (HOSPITAL_BASED_OUTPATIENT_CLINIC_OR_DEPARTMENT_OTHER): Payer: Medicare Other

## 2011-12-27 ENCOUNTER — Other Ambulatory Visit (HOSPITAL_BASED_OUTPATIENT_CLINIC_OR_DEPARTMENT_OTHER): Payer: Medicare Other | Admitting: Lab

## 2011-12-27 VITALS — BP 170/80 | HR 61 | Temp 97.7°F

## 2011-12-27 DIAGNOSIS — D519 Vitamin B12 deficiency anemia, unspecified: Secondary | ICD-10-CM

## 2011-12-27 DIAGNOSIS — D631 Anemia in chronic kidney disease: Secondary | ICD-10-CM

## 2011-12-27 DIAGNOSIS — N039 Chronic nephritic syndrome with unspecified morphologic changes: Secondary | ICD-10-CM

## 2011-12-27 DIAGNOSIS — N189 Chronic kidney disease, unspecified: Secondary | ICD-10-CM

## 2011-12-27 DIAGNOSIS — D518 Other vitamin B12 deficiency anemias: Secondary | ICD-10-CM

## 2011-12-27 LAB — CBC WITH DIFFERENTIAL/PLATELET
BASO%: 0.5 % (ref 0.0–2.0)
Basophils Absolute: 0 10*3/uL (ref 0.0–0.1)
Eosinophils Absolute: 0.1 10*3/uL (ref 0.0–0.5)
HCT: 35.2 % (ref 34.8–46.6)
HGB: 11.6 g/dL (ref 11.6–15.9)
LYMPH%: 30 % (ref 14.0–49.7)
MCHC: 33 g/dL (ref 31.5–36.0)
MONO#: 0.7 10*3/uL (ref 0.1–0.9)
NEUT#: 3.2 10*3/uL (ref 1.5–6.5)
NEUT%: 55.7 % (ref 38.4–76.8)
Platelets: 86 10*3/uL — ABNORMAL LOW (ref 145–400)
WBC: 5.7 10*3/uL (ref 3.9–10.3)
lymph#: 1.7 10*3/uL (ref 0.9–3.3)

## 2011-12-27 MED ORDER — DARBEPOETIN ALFA-POLYSORBATE 500 MCG/ML IJ SOLN: 300.0000 ug | Freq: Once | INTRAMUSCULAR | Status: DC

## 2011-12-27 MED ORDER — CYANOCOBALAMIN 1000 MCG/ML IJ SOLN
1000.0000 ug | INTRAMUSCULAR | Status: DC
  Administered 2011-12-27: 1000 ug via SUBCUTANEOUS

## 2012-01-08 ENCOUNTER — Ambulatory Visit: Payer: Medicare Other

## 2012-01-08 ENCOUNTER — Other Ambulatory Visit: Payer: Medicare Other | Admitting: Lab

## 2012-01-21 ENCOUNTER — Ambulatory Visit
Admission: RE | Admit: 2012-01-21 | Discharge: 2012-01-21 | Disposition: A | Discharge status: Home / Self Care | Payer: Medicare Other | Source: Ambulatory Visit | Attending: Family Medicine | Admitting: Family Medicine

## 2012-01-21 DIAGNOSIS — Z1231 Encounter for screening mammogram for malignant neoplasm of breast: Secondary | ICD-10-CM

## 2012-01-21 DIAGNOSIS — M810 Age-related osteoporosis without current pathological fracture: Secondary | ICD-10-CM

## 2012-01-22 ENCOUNTER — Other Ambulatory Visit: Payer: Medicare Other

## 2012-01-22 ENCOUNTER — Ambulatory Visit: Payer: Medicare Other

## 2012-01-24 ENCOUNTER — Ambulatory Visit (HOSPITAL_BASED_OUTPATIENT_CLINIC_OR_DEPARTMENT_OTHER): Payer: Medicare Other

## 2012-01-24 ENCOUNTER — Other Ambulatory Visit (HOSPITAL_BASED_OUTPATIENT_CLINIC_OR_DEPARTMENT_OTHER): Payer: Medicare Other | Admitting: Lab

## 2012-01-24 VITALS — BP 144/57 | HR 58 | Temp 96.8°F

## 2012-01-24 DIAGNOSIS — D631 Anemia in chronic kidney disease: Secondary | ICD-10-CM

## 2012-01-24 DIAGNOSIS — D519 Vitamin B12 deficiency anemia, unspecified: Secondary | ICD-10-CM

## 2012-01-24 DIAGNOSIS — E538 Deficiency of other specified B group vitamins: Secondary | ICD-10-CM

## 2012-01-24 DIAGNOSIS — N189 Chronic kidney disease, unspecified: Secondary | ICD-10-CM

## 2012-01-24 DIAGNOSIS — D696 Thrombocytopenia, unspecified: Secondary | ICD-10-CM

## 2012-01-24 LAB — CBC WITH DIFFERENTIAL/PLATELET
BASO%: 0.5 % (ref 0.0–2.0)
EOS%: 2 % (ref 0.0–7.0)
HCT: 32.1 % — ABNORMAL LOW (ref 34.8–46.6)
LYMPH%: 42.8 % (ref 14.0–49.7)
MCH: 29.3 pg (ref 25.1–34.0)
MCHC: 32.7 g/dL (ref 31.5–36.0)
MCV: 89.6 fL (ref 79.5–101.0)
MONO%: 9.6 % (ref 0.0–14.0)
NEUT%: 45.1 % (ref 38.4–76.8)
Platelets: 101 10*3/uL — ABNORMAL LOW (ref 145–400)
RBC: 3.59 10*6/uL — ABNORMAL LOW (ref 3.70–5.45)
WBC: 5.3 10*3/uL (ref 3.9–10.3)

## 2012-01-24 MED ORDER — CYANOCOBALAMIN 1000 MCG/ML IJ SOLN
1000.0000 ug | INTRAMUSCULAR | Status: DC
  Administered 2012-01-24: 1000 ug via SUBCUTANEOUS

## 2012-01-24 MED ORDER — DARBEPOETIN ALFA-POLYSORBATE 300 MCG/0.6ML IJ SOLN
300.0000 ug | Freq: Once | INTRAMUSCULAR | Status: AC
  Administered 2012-01-24: 300 ug via SUBCUTANEOUS
  Filled 2012-01-24: qty 0.6

## 2012-01-31 ENCOUNTER — Ambulatory Visit: Payer: Medicare Other

## 2012-02-05 ENCOUNTER — Ambulatory Visit: Payer: Medicare Other

## 2012-02-05 ENCOUNTER — Other Ambulatory Visit: Payer: Medicare Other | Admitting: Lab

## 2012-02-20 ENCOUNTER — Other Ambulatory Visit: Payer: Medicare Other

## 2012-02-20 ENCOUNTER — Ambulatory Visit: Payer: Medicare Other

## 2012-02-21 ENCOUNTER — Other Ambulatory Visit (HOSPITAL_BASED_OUTPATIENT_CLINIC_OR_DEPARTMENT_OTHER): Payer: Medicare Other | Admitting: Lab

## 2012-02-21 ENCOUNTER — Telehealth: Payer: Self-pay | Admitting: Oncology

## 2012-02-21 ENCOUNTER — Ambulatory Visit (HOSPITAL_BASED_OUTPATIENT_CLINIC_OR_DEPARTMENT_OTHER): Payer: Medicare Other | Admitting: Oncology

## 2012-02-21 VITALS — BP 174/75 | HR 56 | Temp 96.2°F | Resp 18 | Ht 63.0 in | Wt 113.9 lb

## 2012-02-21 DIAGNOSIS — N039 Chronic nephritic syndrome with unspecified morphologic changes: Secondary | ICD-10-CM

## 2012-02-21 DIAGNOSIS — D518 Other vitamin B12 deficiency anemias: Secondary | ICD-10-CM

## 2012-02-21 DIAGNOSIS — D519 Vitamin B12 deficiency anemia, unspecified: Secondary | ICD-10-CM

## 2012-02-21 DIAGNOSIS — D696 Thrombocytopenia, unspecified: Secondary | ICD-10-CM

## 2012-02-21 DIAGNOSIS — D631 Anemia in chronic kidney disease: Secondary | ICD-10-CM

## 2012-02-21 DIAGNOSIS — N189 Chronic kidney disease, unspecified: Secondary | ICD-10-CM

## 2012-02-21 LAB — CBC WITH DIFFERENTIAL/PLATELET
BASO%: 1.1 % (ref 0.0–2.0)
EOS%: 1.2 % (ref 0.0–7.0)
MCH: 30 pg (ref 25.1–34.0)
MCHC: 33 g/dL (ref 31.5–36.0)
MONO#: 0.4 10*3/uL (ref 0.1–0.9)
NEUT%: 48.4 % (ref 38.4–76.8)
RBC: 3.84 10*6/uL (ref 3.70–5.45)
WBC: 4.2 10*3/uL (ref 3.9–10.3)
lymph#: 1.6 10*3/uL (ref 0.9–3.3)

## 2012-02-21 MED ORDER — CYANOCOBALAMIN 1000 MCG/ML IJ SOLN
1000.0000 ug | Freq: Once | INTRAMUSCULAR | Status: AC
  Administered 2012-02-21: 1000 ug via INTRAMUSCULAR

## 2012-02-21 NOTE — Telephone Encounter (Signed)
gv and printed pt appt schedule from Jan thru Dec 2014

## 2012-02-21 NOTE — Progress Notes (Signed)
[facility]  Telephone:(336) [phone] Fax:(336) [phone]   OFFICE PROGRESS NOTE   Cc:  BURNETT,[name], MD  DIAGNOSIS:   Anemia of chronic kidney disease and vitamin B12 deficiency, thrombocytopenia from presumed immune mediated process.  COMORBIDITIES:   1. Diabetes type 2.  2. Hypertension.  3. Hyperlipidemia.  4. Osteoarthritis.  5. Chronic kidney disease with 24-hour urine collection 01/26/2008 with creatinine clearance of 38 mL/minute.  CURRENT THERAPY:  Aranesp 300 mcg subcu every 4 weeks for hemoglobin less than 11 and vitamin B12 1000 mcg subcu monthly.  She had been on chronic low-dose prednisone for ITP.  Prednisone was recently discontinued from nursing home discharge due to pedal edema.  INTERVAL HISTORY: [name] 78 y.o. female returns for regular follow up with her daughter.  She underwent hernia repair this summer.  She required nursing home placement for rehab afterward.  [name] now home.  She has diffuse, generalized fatigue.  She needs to hold onto furniture to ambulate around the house.  She needs a wheelchair when she ambulates more than 20 ft.  She developed bilateral pedal edema this past summer.  There was also some erythema and mild tenderness in her bilateral ankles.  She was given empiric antibiotics with improvement of her swelling.  However, as soon as she finished antibiotic, the swelling got worse around the ankles.  She has not tried compression stocking as advised.   Patient denies fever, anorexia, weight loss, headache, visual changes, confusion, drenching night sweats, palpable lymph node swelling, mucositis, odynophagia, dysphagia, nausea vomiting, jaundice, chest pain, palpitation, shortness of breath, dyspnea on exertion, productive cough, gum bleeding, epistaxis, hematemesis, hemoptysis, abdominal pain, abdominal swelling, early satiety, melena, hematochezia, hematuria, skin rash, spontaneous bleeding, heat or cold intolerance, bowel  bladder incontinence, paresthesia, depression.   Past Medical History  Diagnosis Date  . Hypertension   . Anxiety and depression   . Chronic headaches   . Diverticular disease   . Anemia   . Arthritis     osteoarthritis  . Osteoporosis   . Vitamin B 12 deficiency   . History of ITP   . Anxiety     takes xanax for sleep  . Diabetes mellitus     NIDDM x 12 years  . Heart murmur     benign;asymptomatic  . Chronic kidney disease     esrd    Past Surgical History  Procedure Date  . Cholecystectomy   . Av fistula placement 11/15/10    left upper arm AVF  . Abdominal hysterectomy 1973  . Joint replacement 2007    rt hip  . Eye surgery   . Cataract extraction w/ intraocular lens  implant, bilateral 2002  . Vascular surgery 10-2010    left AVF  . Hernia repair 2000    ventral  . Ventral hernia repair 08/06/2011    Procedure: HERNIA REPAIR VENTRAL ADULT;  Surgeon: Liz Malady, MD;  Location: [facility] OR;  Service: General;  Laterality: N/A;  Open Repair [employer]current Inc. Hernia with Mesh    Current Outpatient Prescriptions  Medication Sig Dispense Refill  . acetaminophen (TYLENOL) 500 MG tablet Take 500 mg by mouth 2 (two) times daily as needed. For pain.      Marland Kitchen albuterol (PROVENTIL) (5 MG/ML) 0.5% nebulizer solution Take 0.5 mLs (2.5 mg total) by nebulization every 6 (six) hours.  20 mL    . alprazolam (XANAX) 2 MG tablet Take 1-2 mg by mouth at bedtime as needed. For  sleep.       . antiseptic oral rinse (BIOTENE) LIQD 15 mLs by Mouth Rinse route QID.      Marland Kitchen atenolol (TENORMIN) 25 MG tablet Take 25 mg by mouth 2 (two) times daily.       Marland Kitchen atorvastatin (LIPITOR) 20 MG tablet Take 20 mg by mouth. daily      . cloNIDine (CATAPRES) 0.1 MG tablet Take 0.1 mg by mouth 2 (two) times daily.        . cyanocobalamin (,VITAMIN B-12,) 1000 MCG/ML injection Inject 1,000 mcg into the muscle every 30 (thirty) days.       . darbepoetin (ARANESP) 150 MCG/0.3ML SOLN Inject 150 mcg into the skin  every 30 (thirty) days. As needed. Only receives if blood test is low      . furosemide (LASIX) 20 MG tablet Take 20 mg by mouth daily.       . insulin aspart (NOVOLOG) 100 UNIT/ML injection Inject 0-9 Units into the skin every 4 (four) hours.  1 vial    . ipratropium (ATROVENT) 0.02 % nebulizer solution Take 2.5 mLs (0.5 mg total) by nebulization every 6 (six) hours.  75 mL    . levETIRAcetam (KEPPRA) 250 MG tablet Take 250 mg by mouth 2 (two) times daily.      . methylPREDNISolone sodium succinate (SOLU-MEDROL) 40 mg/mL injection Inject 1 mL (40 mg total) into the vein every 12 (twelve) hours.  1 each    . metoprolol tartrate (LOPRESSOR) 25 mg/10 mL SUSP Place 5 mLs (12.5 mg total) into feeding tube 2 (two) times daily.      . mirtazapine (REMERON) 15 MG tablet Take 15 mg by mouth Daily.      . Nutritional Supplements (FEEDING SUPPLEMENT, NEPRO CARB STEADY,) LIQD Place 1,000 mLs into feeding tube daily.      Marland Kitchen oxybutynin (DITROPAN) 5 MG tablet Take 5 mg by mouth daily.       . phenytoin (DILANTIN) 125 MG/5ML suspension Take 4 mLs (100 mg total) by mouth 3 (three) times daily.  237 mL    . pioglitazone (ACTOS) 30 MG tablet Take 30 mg by mouth daily.       . simvastatin (ZOCOR) 40 MG tablet Take 40 mg by mouth at bedtime.        . sodium chloride 0.9 % infusion Inject 250 mLs into the vein as needed (if IV carrier fluid needed.).      Marland Kitchen modafinil (PROVIGIL) 200 MG tablet Place 1 tablet (200 mg total) into feeding tube daily.        ALLERGIES:   has no known allergies.  REVIEW OF SYSTEMS:  The rest of the 14-point review of system was negative.   Filed Vitals:   02/21/12 1157  BP: 174/75  Pulse: 56  Temp: 96.2 F (35.7 C)  Resp: 18   Wt Readings from Last 3 Encounters:  02/21/12 113 lb 14.4 oz (51.665 kg)  08/26/11 143 lb 15.4 oz (65.3 kg)  08/06/11 132 lb (59.875 kg)   ECOG Performance status: 2  PHYSICAL EXAMINATION:   General:  Thin-appearing woman, chronically tired  appearing, in no acute distress.  Eyes:  no scleral icterus.  ENT:  There were no oropharyngeal lesions.  Neck was without thyromegaly.  Lymphatics:  Negative cervical, supraclavicular or axillary adenopathy.  Respiratory: lungs were clear bilaterally without wheezing or crackles.  Cardiovascular:  Regular rate and rhythm, S1/S2, without murmur, rub or gallop.  There was 1+bilateral pedal edema.  GI:  abdomen was soft, flat, nontender, nondistended, without organomegaly.  Muscoloskeletal:  no spinal tenderness of palpation of vertebral spine.  Skin exam was without  petichae.  There were echymosis on the bilateral shins.  Patient was not able to get on and off exam table due to fatigue. Patient was alerted and oriented.  Attention was good.   Language was appropriate.  Mood was normal without depression.  Speech was not pressured.  Thought content was not tangential.      LABORATORY/RADIOLOGY DATA:  Lab Results  Component Value Date   WBC 4.2 02/21/2012   HGB 11.5* 02/21/2012   HCT 35.0 02/21/2012   PLT 83* 02/21/2012   GLUCOSE 94 11/01/2011   CHOL  Value: 182        ATP III CLASSIFICATION:  <200     mg/dL   Desirable  045-409  mg/dL   Borderline High  >=811    mg/dL   High        91/05/7827   TRIG 85 02/01/2009   HDL 106 02/01/2009   LDLCALC  Value: 59        Total Cholesterol/HDL:CHD Risk Coronary Heart Disease Risk Table                     Men   Women  1/2 Average Risk   3.4   3.3  Average Risk       5.0   4.4  2 X Average Risk   9.6   7.1  3 X Average Risk  23.4   11.0        Use the calculated Patient Ratio above and the CHD Risk Table to determine the patient's CHD Risk.        ATP III CLASSIFICATION (LDL):  <100     mg/dL   Optimal  562-130  mg/dL   Near or Above                    Optimal  130-159  mg/dL   Borderline  865-784  mg/dL   High  >696     mg/dL   Very High 29/06/2839   ALKPHOS 86 11/01/2011   ALT 8 11/01/2011   AST 14 11/01/2011   NA 136 11/01/2011   K 5.4* 11/01/2011   CL 103 11/01/2011    CREATININE 1.7* 11/01/2011   BUN 28.0* 11/01/2011   CO2 24 11/01/2011   INR 1.05 08/19/2011   HGBA1C  Value: 7.1 (NOTE) The ADA recommends the following therapeutic goal for glycemic control related to Hgb A1c measurement: Goal of therapy: <6.5 Hgb A1c  Reference: American Diabetes Association: Clinical Practice Recommendations 2010, Diabetes Care, 2010, 33: (Suppl  1).* 05/22/2009      ASSESSMENT AND PLAN:    1. History of Vit B12 deficiency.  She continues to receive monthly vitamin B12 1000 mcg IM.  2. History of renal insufficiency secondary to most likely diabetes and hypertension.  She follows with urology.  3. Renal insufficiency induced chronic anemia.  She has been receiving Aranesp 300 mcg subcu every 4 weeks for hemoglobin less than 11.  This has helped her to needing pRBC transfusion.  Her Hgb is >11; thus, she will not receive Aranesp injection today.  Patient and her daughter are aware of the slight increase in risk of thrombosis on Aranesp but preferred to continue it to decrease her fatigue and need for pRBC transfusion.  4. Thrombocytopenia from presumed ITP.  She was on Prednisone.  This was discontinued when she developed pedal edema.  I will monitor her monthly CBC and resume Prednisone when Plt <50.  5. Diabetes type 2.  [name] on pioglitazone per PCP.   6. Hyperlipidemia.  [name] on simvastatin per PCP. 7. Hypertension.  [name] on atenolol, clonidine, Lasix per PCP.  8. Fatigue:  Most likely due to renal disease and co-morbidities.   9. Follow-up:  In about 4 months.  Monthly CBC and VitB12 injection (regardless of Hgb) and Arenesp injection (for Hgb <11).     The length of time of the face-to-face encounter was 15 minutes. More than 50% of time was spent counseling and coordination of care.

## 2012-02-28 ENCOUNTER — Ambulatory Visit: Payer: Medicare Other

## 2012-03-02 ENCOUNTER — Ambulatory Visit: Payer: Medicare Other | Admitting: Oncology

## 2012-03-02 ENCOUNTER — Ambulatory Visit: Payer: Medicare Other

## 2012-03-02 ENCOUNTER — Other Ambulatory Visit: Payer: Medicare Other | Admitting: Lab

## 2012-03-04 ENCOUNTER — Ambulatory Visit: Payer: Medicare Other

## 2012-03-18 ENCOUNTER — Ambulatory Visit: Payer: Medicare Other

## 2012-03-18 ENCOUNTER — Other Ambulatory Visit: Payer: Medicare Other

## 2012-03-20 ENCOUNTER — Ambulatory Visit (HOSPITAL_BASED_OUTPATIENT_CLINIC_OR_DEPARTMENT_OTHER): Payer: Medicare Other

## 2012-03-20 ENCOUNTER — Other Ambulatory Visit (HOSPITAL_BASED_OUTPATIENT_CLINIC_OR_DEPARTMENT_OTHER): Payer: Medicare Other | Admitting: Lab

## 2012-03-20 VITALS — BP 138/69 | HR 61 | Temp 96.7°F

## 2012-03-20 DIAGNOSIS — N189 Chronic kidney disease, unspecified: Secondary | ICD-10-CM

## 2012-03-20 DIAGNOSIS — D631 Anemia in chronic kidney disease: Secondary | ICD-10-CM

## 2012-03-20 DIAGNOSIS — D519 Vitamin B12 deficiency anemia, unspecified: Secondary | ICD-10-CM

## 2012-03-20 DIAGNOSIS — E538 Deficiency of other specified B group vitamins: Secondary | ICD-10-CM

## 2012-03-20 DIAGNOSIS — D696 Thrombocytopenia, unspecified: Secondary | ICD-10-CM

## 2012-03-20 DIAGNOSIS — N039 Chronic nephritic syndrome with unspecified morphologic changes: Secondary | ICD-10-CM

## 2012-03-20 LAB — CBC WITH DIFFERENTIAL/PLATELET
BASO%: 1.2 % (ref 0.0–2.0)
EOS%: 2.7 % (ref 0.0–7.0)
HCT: 32.7 % — ABNORMAL LOW (ref 34.8–46.6)
HGB: 10.8 g/dL — ABNORMAL LOW (ref 11.6–15.9)
MCH: 29.5 pg (ref 25.1–34.0)
MCHC: 33.1 g/dL (ref 31.5–36.0)
MONO#: 0.6 10*3/uL (ref 0.1–0.9)
NEUT%: 43.4 % (ref 38.4–76.8)
RDW: 13.8 % (ref 11.2–14.5)
WBC: 5.1 10*3/uL (ref 3.9–10.3)
lymph#: 2.1 10*3/uL (ref 0.9–3.3)

## 2012-03-20 MED ORDER — CYANOCOBALAMIN 1000 MCG/ML IJ SOLN
1000.0000 ug | INTRAMUSCULAR | Status: DC
  Administered 2012-03-20: 1000 ug via SUBCUTANEOUS

## 2012-03-20 MED ORDER — DARBEPOETIN ALFA-POLYSORBATE 300 MCG/0.6ML IJ SOLN
300.0000 ug | Freq: Once | INTRAMUSCULAR | Status: AC
  Administered 2012-03-20: 300 ug via SUBCUTANEOUS
  Filled 2012-03-20: qty 0.6

## 2012-04-17 ENCOUNTER — Other Ambulatory Visit (HOSPITAL_BASED_OUTPATIENT_CLINIC_OR_DEPARTMENT_OTHER): Payer: Medicare Other | Admitting: Lab

## 2012-04-17 ENCOUNTER — Ambulatory Visit (HOSPITAL_BASED_OUTPATIENT_CLINIC_OR_DEPARTMENT_OTHER): Payer: Medicare Other

## 2012-04-17 VITALS — BP 137/61 | HR 61 | Temp 97.4°F

## 2012-04-17 DIAGNOSIS — D696 Thrombocytopenia, unspecified: Secondary | ICD-10-CM

## 2012-04-17 DIAGNOSIS — N189 Chronic kidney disease, unspecified: Secondary | ICD-10-CM

## 2012-04-17 DIAGNOSIS — N039 Chronic nephritic syndrome with unspecified morphologic changes: Secondary | ICD-10-CM

## 2012-04-17 DIAGNOSIS — D519 Vitamin B12 deficiency anemia, unspecified: Secondary | ICD-10-CM

## 2012-04-17 DIAGNOSIS — D631 Anemia in chronic kidney disease: Secondary | ICD-10-CM

## 2012-04-17 DIAGNOSIS — E538 Deficiency of other specified B group vitamins: Secondary | ICD-10-CM

## 2012-04-17 LAB — CBC WITH DIFFERENTIAL/PLATELET
Basophils Absolute: 0.1 10*3/uL (ref 0.0–0.1)
Eosinophils Absolute: 0.1 10*3/uL (ref 0.0–0.5)
HCT: 36.4 % (ref 34.8–46.6)
HGB: 12 g/dL (ref 11.6–15.9)
MCH: 29.8 pg (ref 25.1–34.0)
MONO#: 0.6 10*3/uL (ref 0.1–0.9)
NEUT#: 2.4 10*3/uL (ref 1.5–6.5)
NEUT%: 46.5 % (ref 38.4–76.8)
RDW: 14.4 % (ref 11.2–14.5)
lymph#: 2.1 10*3/uL (ref 0.9–3.3)

## 2012-04-17 MED ORDER — DARBEPOETIN ALFA-POLYSORBATE 500 MCG/ML IJ SOLN: 300.0000 ug | Freq: Once | INTRAMUSCULAR | Status: DC

## 2012-04-17 MED ORDER — CYANOCOBALAMIN 1000 MCG/ML IJ SOLN
1000.0000 ug | INTRAMUSCULAR | Status: DC
  Administered 2012-04-17: 1000 ug via SUBCUTANEOUS

## 2012-04-22 ENCOUNTER — Other Ambulatory Visit: Payer: Medicare Other

## 2012-04-22 ENCOUNTER — Ambulatory Visit: Payer: Medicare Other

## 2012-05-06 ENCOUNTER — Other Ambulatory Visit (HOSPITAL_COMMUNITY): Payer: Self-pay | Admitting: Family Medicine

## 2012-05-06 DIAGNOSIS — M79609 Pain in unspecified limb: Secondary | ICD-10-CM

## 2012-05-06 DIAGNOSIS — M7989 Other specified soft tissue disorders: Secondary | ICD-10-CM

## 2012-05-11 ENCOUNTER — Ambulatory Visit (HOSPITAL_COMMUNITY)
Admission: RE | Admit: 2012-05-11 | Discharge: 2012-05-11 | Disposition: A | Discharge status: Home / Self Care | Payer: Medicare Other | Source: Ambulatory Visit | Attending: Cardiovascular Disease | Admitting: Cardiovascular Disease

## 2012-05-11 DIAGNOSIS — M7989 Other specified soft tissue disorders: Secondary | ICD-10-CM

## 2012-05-11 DIAGNOSIS — M79609 Pain in unspecified limb: Secondary | ICD-10-CM

## 2012-05-11 NOTE — Progress Notes (Signed)
Bilateral lower extremity arterial study was completed Larene Pickett RVT 1:58 PM

## 2012-05-15 ENCOUNTER — Ambulatory Visit (HOSPITAL_BASED_OUTPATIENT_CLINIC_OR_DEPARTMENT_OTHER): Payer: Medicare Other

## 2012-05-15 ENCOUNTER — Other Ambulatory Visit (HOSPITAL_BASED_OUTPATIENT_CLINIC_OR_DEPARTMENT_OTHER): Payer: Medicare Other | Admitting: Lab

## 2012-05-15 VITALS — BP 162/55 | HR 61 | Temp 97.9°F

## 2012-05-15 DIAGNOSIS — E538 Deficiency of other specified B group vitamins: Secondary | ICD-10-CM

## 2012-05-15 DIAGNOSIS — N189 Chronic kidney disease, unspecified: Secondary | ICD-10-CM

## 2012-05-15 DIAGNOSIS — D696 Thrombocytopenia, unspecified: Secondary | ICD-10-CM

## 2012-05-15 DIAGNOSIS — D519 Vitamin B12 deficiency anemia, unspecified: Secondary | ICD-10-CM

## 2012-05-15 DIAGNOSIS — N039 Chronic nephritic syndrome with unspecified morphologic changes: Secondary | ICD-10-CM

## 2012-05-15 DIAGNOSIS — D631 Anemia in chronic kidney disease: Secondary | ICD-10-CM

## 2012-05-15 LAB — CBC WITH DIFFERENTIAL/PLATELET
Basophils Absolute: 0 10*3/uL (ref 0.0–0.1)
EOS%: 2.8 % (ref 0.0–7.0)
Eosinophils Absolute: 0.2 10*3/uL (ref 0.0–0.5)
HCT: 34.8 % (ref 34.8–46.6)
HGB: 11 g/dL — ABNORMAL LOW (ref 11.6–15.9)
LYMPH%: 40.1 % (ref 14.0–49.7)
MCH: 28.9 pg (ref 25.1–34.0)
MCV: 91.3 fL (ref 79.5–101.0)
MONO%: 12 % (ref 0.0–14.0)
NEUT#: 2.5 10*3/uL (ref 1.5–6.5)
NEUT%: 44.6 % (ref 38.4–76.8)
Platelets: 117 10*3/uL — ABNORMAL LOW (ref 145–400)
RDW: 13.1 % (ref 11.2–14.5)

## 2012-05-15 MED ORDER — DARBEPOETIN ALFA-POLYSORBATE 500 MCG/ML IJ SOLN: 300.0000 ug | Freq: Once | INTRAMUSCULAR | Status: DC

## 2012-05-15 MED ORDER — CYANOCOBALAMIN 1000 MCG/ML IJ SOLN
1000.0000 ug | INTRAMUSCULAR | Status: DC
Start: 2012-05-15 — End: 2012-05-15
  Administered 2012-05-15: 1000 ug via SUBCUTANEOUS

## 2012-05-15 NOTE — Patient Instructions (Addendum)

## 2012-05-20 ENCOUNTER — Other Ambulatory Visit: Payer: Medicare Other | Admitting: Lab

## 2012-05-20 ENCOUNTER — Ambulatory Visit: Payer: Medicare Other

## 2012-06-12 ENCOUNTER — Ambulatory Visit (HOSPITAL_BASED_OUTPATIENT_CLINIC_OR_DEPARTMENT_OTHER): Payer: Medicare Other | Admitting: Oncology

## 2012-06-12 ENCOUNTER — Ambulatory Visit (HOSPITAL_BASED_OUTPATIENT_CLINIC_OR_DEPARTMENT_OTHER): Payer: Medicare Other

## 2012-06-12 ENCOUNTER — Encounter: Payer: Self-pay | Admitting: Oncology

## 2012-06-12 ENCOUNTER — Other Ambulatory Visit (HOSPITAL_BASED_OUTPATIENT_CLINIC_OR_DEPARTMENT_OTHER): Payer: Medicare Other | Admitting: Lab

## 2012-06-12 VITALS — BP 145/63 | HR 62 | Temp 97.1°F | Resp 20 | Ht 63.0 in | Wt 105.7 lb

## 2012-06-12 DIAGNOSIS — D519 Vitamin B12 deficiency anemia, unspecified: Secondary | ICD-10-CM

## 2012-06-12 DIAGNOSIS — D649 Anemia, unspecified: Secondary | ICD-10-CM

## 2012-06-12 DIAGNOSIS — E538 Deficiency of other specified B group vitamins: Secondary | ICD-10-CM

## 2012-06-12 DIAGNOSIS — N189 Chronic kidney disease, unspecified: Secondary | ICD-10-CM

## 2012-06-12 DIAGNOSIS — D696 Thrombocytopenia, unspecified: Secondary | ICD-10-CM

## 2012-06-12 DIAGNOSIS — N289 Disorder of kidney and ureter, unspecified: Secondary | ICD-10-CM

## 2012-06-12 LAB — CBC WITH DIFFERENTIAL/PLATELET
EOS%: 3.6 % (ref 0.0–7.0)
Eosinophils Absolute: 0.2 10*3/uL (ref 0.0–0.5)
LYMPH%: 36.1 % (ref 14.0–49.7)
MCH: 29.7 pg (ref 25.1–34.0)
MCV: 89.3 fL (ref 79.5–101.0)
MONO%: 9.3 % (ref 0.0–14.0)
NEUT#: 2.6 10*3/uL (ref 1.5–6.5)
Platelets: 164 10*3/uL (ref 145–400)
RBC: 3.45 10*6/uL — ABNORMAL LOW (ref 3.70–5.45)

## 2012-06-12 MED ORDER — DARBEPOETIN ALFA-POLYSORBATE 300 MCG/0.6ML IJ SOLN
300.0000 ug | Freq: Once | INTRAMUSCULAR | Status: AC
  Administered 2012-06-12: 300 ug via SUBCUTANEOUS
  Filled 2012-06-12: qty 0.6

## 2012-06-12 MED ORDER — CYANOCOBALAMIN 1000 MCG/ML IJ SOLN
1000.0000 ug | INTRAMUSCULAR | Status: DC
  Administered 2012-06-12: 1000 ug via SUBCUTANEOUS

## 2012-06-12 NOTE — Progress Notes (Signed)
[facility]  [phone] [phone]   OFFICE PROGRESS NOTE   Cc:  BURNETT,[name], MD  DIAGNOSIS:   Anemia of chronic kidney disease and vitamin B12 deficiency, thrombocytopenia from presumed immune mediated process.  COMORBIDITIES:   1. Diabetes type 2.  2. Hypertension.  3. Hyperlipidemia.  4. Osteoarthritis.  5. Chronic kidney disease with 24-hour urine collection 01/26/2008 with creatinine clearance of 38 mL/minute.  CURRENT THERAPY:  Aranesp 300 mcg subcu every 4 weeks for hemoglobin less than 11 and vitamin B12 1000 mcg subcu monthly.  She had been on chronic low-dose prednisone for ITP.  Prednisone was recently discontinued from nursing home discharge due to pedal edema.  INTERVAL HISTORY: [name] 79 y.o. female returns for regular follow up with her daughter.  She has diffuse, generalized fatigue. She can walk short distances, but tires easily. [name] able to do small chores around the home. No bleeding.  Patient denies fever, anorexia, weight loss, headache, visual changes, confusion, drenching night sweats, palpable lymph node swelling, mucositis, odynophagia, dysphagia, nausea vomiting, jaundice, chest pain, palpitation, shortness of breath, dyspnea on exertion, productive cough, gum bleeding, epistaxis, hematemesis, hemoptysis, abdominal pain, abdominal swelling, early satiety, melena, hematochezia, hematuria, skin rash, spontaneous bleeding, heat or cold intolerance, bowel bladder incontinence, paresthesia, depression.   Past Medical History  Diagnosis Date  . Hypertension   . Anxiety and depression   . Chronic headaches   . Diverticular disease   . Anemia   . Arthritis     osteoarthritis  . Osteoporosis   . Vitamin B 12 deficiency   . History of ITP   . Anxiety     takes xanax for sleep  . Diabetes mellitus     NIDDM x 12 years  . Heart murmur     benign;asymptomatic  . Chronic kidney disease     esrd    Past  Surgical History  Procedure Laterality Date  . Cholecystectomy    . Av fistula placement  11/15/10    left upper arm AVF  . Abdominal hysterectomy  1973  . Joint replacement  2007    rt hip  . Eye surgery    . Cataract extraction w/ intraocular lens  implant, bilateral  2002  . Vascular surgery  10-2010    left AVF  . Hernia repair  2000    ventral  . Ventral hernia repair  08/06/2011    Procedure: HERNIA REPAIR VENTRAL ADULT;  Surgeon: Liz Malady, MD;  Location: [facility] OR;  Service: General;  Laterality: N/A;  Open Repair [employer]. Hernia with Mesh    Current Outpatient Prescriptions  Medication Sig Dispense Refill  . acetaminophen (TYLENOL) 500 MG tablet Take 500 mg by mouth 2 (two) times daily as needed. For pain.      Marland Kitchen albuterol (PROVENTIL) (5 MG/ML) 0.5% nebulizer solution Take 0.5 mLs (2.5 mg total) by nebulization every 6 (six) hours.  20 mL    . alprazolam (XANAX) 2 MG tablet Take 1-2 mg by mouth at bedtime as needed. For sleep.       Marland Kitchen antiseptic oral rinse (BIOTENE) LIQD 15 mLs by Mouth Rinse route QID.      Marland Kitchen atenolol (TENORMIN) 25 MG tablet Take 25 mg by mouth 2 (two) times daily.       Marland Kitchen atorvastatin (LIPITOR) 20 MG tablet Take 20 mg by mouth. daily      . cloNIDine (CATAPRES) 0.1 MG tablet Take 0.1 mg by  mouth 2 (two) times daily.        . cyanocobalamin (,VITAMIN B-12,) 1000 MCG/ML injection Inject 1,000 mcg into the muscle every 30 (thirty) days.       . darbepoetin (ARANESP) 150 MCG/0.3ML SOLN Inject 150 mcg into the skin every 30 (thirty) days. As needed. Only receives if blood test is low      . furosemide (LASIX) 20 MG tablet Take 20 mg by mouth daily.       . insulin aspart (NOVOLOG) 100 UNIT/ML injection Inject 0-9 Units into the skin every 4 (four) hours.  1 vial    . ipratropium (ATROVENT) 0.02 % nebulizer solution Take 2.5 mLs (0.5 mg total) by nebulization every 6 (six) hours.  75 mL    . levETIRAcetam (KEPPRA) 250 MG tablet Take 250 mg by mouth 2 (two)  times daily.      . methylPREDNISolone sodium succinate (SOLU-MEDROL) 40 mg/mL injection Inject 1 mL (40 mg total) into the vein every 12 (twelve) hours.  1 each    . metoprolol tartrate (LOPRESSOR) 25 mg/10 mL SUSP Place 5 mLs (12.5 mg total) into feeding tube 2 (two) times daily.      . mirtazapine (REMERON) 15 MG tablet Take 15 mg by mouth Daily.      . modafinil (PROVIGIL) 200 MG tablet Place 1 tablet (200 mg total) into feeding tube daily.      . Nutritional Supplements (FEEDING SUPPLEMENT, NEPRO CARB STEADY,) LIQD Place 1,000 mLs into feeding tube daily.      Marland Kitchen oxybutynin (DITROPAN) 5 MG tablet Take 5 mg by mouth daily.       . phenytoin (DILANTIN) 125 MG/5ML suspension Take 4 mLs (100 mg total) by mouth 3 (three) times daily.  237 mL    . pioglitazone (ACTOS) 30 MG tablet Take 30 mg by mouth daily.       . simvastatin (ZOCOR) 40 MG tablet Take 40 mg by mouth at bedtime.        . sodium chloride 0.9 % infusion Inject 250 mLs into the vein as needed (if IV carrier fluid needed.).       No current facility-administered medications for this visit.   Facility-Administered Medications Ordered in Other Visits  Medication Dose Route Frequency Provider Last Rate Last Dose  . cyanocobalamin ((VITAMIN B-12)) injection 1,000 mcg  1,000 mcg Subcutaneous Q30 days Myrtis Ser, NP      . darbepoetin alfa-polysorbate (ARANESP) injection 300 mcg  300 mcg Subcutaneous Once Myrtis Ser, NP        ALLERGIES:  has No Known Allergies.  REVIEW OF SYSTEMS:  The rest of the 14-point review of system was negative.   Filed Vitals:   06/12/12 0925  BP: 145/63  Pulse: 62  Temp: 97.1 F (36.2 C)  Resp: 20   Wt Readings from Last 3 Encounters:  06/12/12 105 lb 11.2 oz (47.945 kg)  02/21/12 113 lb 14.4 oz (51.665 kg)  08/26/11 143 lb 15.4 oz (65.3 kg)   ECOG Performance status: 2  PHYSICAL EXAMINATION:   General:  Thin-appearing woman, chronically tired appearing, in no acute distress.   Eyes:  no scleral icterus.  ENT:  There were no oropharyngeal lesions.  Neck was without thyromegaly.  Lymphatics:  Negative cervical, supraclavicular or axillary adenopathy.  Respiratory: lungs were clear bilaterally without wheezing or crackles.  Cardiovascular:  Regular rate and rhythm, S1/S2, without murmur, rub or gallop.  There was 1+bilateral pedal edema.  GI:  abdomen  was soft, flat, nontender, nondistended, without organomegaly.  Muscoloskeletal:  no spinal tenderness of palpation of vertebral spine.  Skin exam was without  petichae.  There were echymosis on the bilateral shins.  Patient was not able to get on and off exam table due to fatigue. Patient was alerted and oriented.  Attention was good.   Language was appropriate.  Mood was normal without depression.  Speech was not pressured.  Thought content was not tangential.      LABORATORY/RADIOLOGY DATA:  Lab Results  Component Value Date   WBC 5.2 06/12/2012   HGB 10.2* 06/12/2012   HCT 30.8* 06/12/2012   PLT 164 06/12/2012   GLUCOSE 94 11/01/2011   CHOL  Value: 182        ATP III CLASSIFICATION:  <200     mg/dL   Desirable  161-096  mg/dL   Borderline High  >=045    mg/dL   High        40/10/8117   TRIG 85 02/01/2009   HDL 106 02/01/2009   LDLCALC  Value: 59        Total Cholesterol/HDL:CHD Risk Coronary Heart Disease Risk Table                     Men   Women  1/2 Average Risk   3.4   3.3  Average Risk       5.0   4.4  2 X Average Risk   9.6   7.1  3 X Average Risk  23.4   11.0        Use the calculated Patient Ratio above and the CHD Risk Table to determine the patient's CHD Risk.        ATP III CLASSIFICATION (LDL):  <100     mg/dL   Optimal  147-829  mg/dL   Near or Above                    Optimal  130-159  mg/dL   Borderline  562-130  mg/dL   High  >865     mg/dL   Very High 78/05/6960   ALKPHOS 86 11/01/2011   ALT 8 11/01/2011   AST 14 11/01/2011   NA 136 11/01/2011   K 5.4* 11/01/2011   CL 103 11/01/2011   CREATININE 1.7* 11/01/2011   BUN 28.0*  11/01/2011   CO2 24 11/01/2011   INR 1.05 08/19/2011   HGBA1C  Value: 7.1 (NOTE) The ADA recommends the following therapeutic goal for glycemic control related to Hgb A1c measurement: Goal of therapy: <6.5 Hgb A1c  Reference: American Diabetes Association: Clinical Practice Recommendations 2010, Diabetes Care, 2010, 33: (Suppl  1).* 05/22/2009      ASSESSMENT AND PLAN:    1. History of Vit B12 deficiency.  She continues to receive monthly vitamin B12 1000 mcg IM.  2. History of renal insufficiency secondary to most likely diabetes and hypertension.  She follows with nephrology.  3. Renal insufficiency induced chronic anemia.  She has been receiving Aranesp 300 mcg subcu every 4 weeks for hemoglobin less than 11.  This has helped her to needing pRBC transfusion.  Her Hgb is >11; thus, she will receive Aranesp injection today.  Patient and her daughter are aware of the slight increase in risk of thrombosis on Aranesp but preferred to continue it to decrease her fatigue and need for pRBC transfusion.  4. Thrombocytopenia from presumed ITP.  She was on Prednisone.  This was  discontinued when she developed pedal edema.  I will monitor her monthly CBC and resume Prednisone when Plt <50. Platelt count has normalized today. 5. Diabetes type 2.  [name] on sliding scale insulin per PCP.   6. Hyperlipidemia.  [name] on simvastatin per PCP. 7. Hypertension.  [name] on atenolol, clonidine, Lasix per PCP.  8. Fatigue:  Most likely due to renal disease and co-morbidities.   9. Follow-up:  In about 4 months.  Monthly CBC and VitB12 injection (regardless of Hgb) and Arenesp injection (for Hgb <11).     The length of time of the face-to-face encounter was 15 minutes. More than 50% of time was spent counseling and coordination of care.

## 2012-06-17 ENCOUNTER — Other Ambulatory Visit: Payer: Medicare Other

## 2012-06-17 ENCOUNTER — Ambulatory Visit: Payer: Medicare Other

## 2012-07-10 ENCOUNTER — Ambulatory Visit (HOSPITAL_BASED_OUTPATIENT_CLINIC_OR_DEPARTMENT_OTHER): Payer: Medicare Other

## 2012-07-10 ENCOUNTER — Other Ambulatory Visit (HOSPITAL_BASED_OUTPATIENT_CLINIC_OR_DEPARTMENT_OTHER): Payer: Medicare Other

## 2012-07-10 VITALS — BP 149/51 | HR 62 | Temp 97.9°F | Wt 106.2 lb

## 2012-07-10 DIAGNOSIS — D519 Vitamin B12 deficiency anemia, unspecified: Secondary | ICD-10-CM

## 2012-07-10 DIAGNOSIS — N189 Chronic kidney disease, unspecified: Secondary | ICD-10-CM

## 2012-07-10 DIAGNOSIS — D696 Thrombocytopenia, unspecified: Secondary | ICD-10-CM

## 2012-07-10 DIAGNOSIS — D649 Anemia, unspecified: Secondary | ICD-10-CM

## 2012-07-10 DIAGNOSIS — E538 Deficiency of other specified B group vitamins: Secondary | ICD-10-CM

## 2012-07-10 LAB — CBC WITH DIFFERENTIAL/PLATELET
BASO%: 0.9 % (ref 0.0–2.0)
LYMPH%: 34 % (ref 14.0–49.7)
MCHC: 32.8 g/dL (ref 31.5–36.0)
MCV: 91.5 fL (ref 79.5–101.0)
MONO#: 0.5 10*3/uL (ref 0.1–0.9)
MONO%: 11.1 % (ref 0.0–14.0)
Platelets: 111 10*3/uL — ABNORMAL LOW (ref 145–400)
RBC: 3.75 10*6/uL (ref 3.70–5.45)
RDW: 14.5 % (ref 11.2–14.5)
WBC: 4.9 10*3/uL (ref 3.9–10.3)

## 2012-07-10 LAB — COMPREHENSIVE METABOLIC PANEL (CC13)
ALT: 7 U/L (ref 0–55)
AST: 13 U/L (ref 5–34)
Alkaline Phosphatase: 65 U/L (ref 40–150)
Sodium: 142 mEq/L (ref 136–145)
Total Bilirubin: 0.45 mg/dL (ref 0.20–1.20)
Total Protein: 6.1 g/dL — ABNORMAL LOW (ref 6.4–8.3)

## 2012-07-10 MED ORDER — CYANOCOBALAMIN 1000 MCG/ML IJ SOLN
1000.0000 ug | INTRAMUSCULAR | Status: DC
  Administered 2012-07-10: 1000 ug via SUBCUTANEOUS

## 2012-07-10 MED ORDER — DARBEPOETIN ALFA-POLYSORBATE 500 MCG/ML IJ SOLN: 300.0000 ug | Freq: Once | INTRAMUSCULAR | Status: DC

## 2012-08-03 ENCOUNTER — Other Ambulatory Visit: Payer: Self-pay | Admitting: Neurology

## 2012-08-07 ENCOUNTER — Other Ambulatory Visit (HOSPITAL_BASED_OUTPATIENT_CLINIC_OR_DEPARTMENT_OTHER): Payer: Medicare Other | Admitting: Lab

## 2012-08-07 ENCOUNTER — Ambulatory Visit (HOSPITAL_BASED_OUTPATIENT_CLINIC_OR_DEPARTMENT_OTHER): Payer: Medicare Other

## 2012-08-07 VITALS — BP 151/48 | HR 63 | Temp 98.1°F | Wt 105.3 lb

## 2012-08-07 DIAGNOSIS — N039 Chronic nephritic syndrome with unspecified morphologic changes: Secondary | ICD-10-CM

## 2012-08-07 DIAGNOSIS — D518 Other vitamin B12 deficiency anemias: Secondary | ICD-10-CM

## 2012-08-07 DIAGNOSIS — D519 Vitamin B12 deficiency anemia, unspecified: Secondary | ICD-10-CM

## 2012-08-07 DIAGNOSIS — D696 Thrombocytopenia, unspecified: Secondary | ICD-10-CM

## 2012-08-07 DIAGNOSIS — N189 Chronic kidney disease, unspecified: Secondary | ICD-10-CM

## 2012-08-07 DIAGNOSIS — E538 Deficiency of other specified B group vitamins: Secondary | ICD-10-CM

## 2012-08-07 DIAGNOSIS — N289 Disorder of kidney and ureter, unspecified: Secondary | ICD-10-CM

## 2012-08-07 DIAGNOSIS — D649 Anemia, unspecified: Secondary | ICD-10-CM

## 2012-08-07 DIAGNOSIS — D631 Anemia in chronic kidney disease: Secondary | ICD-10-CM

## 2012-08-07 LAB — CBC WITH DIFFERENTIAL/PLATELET
BASO%: 0.7 % (ref 0.0–2.0)
Eosinophils Absolute: 0.2 10*3/uL (ref 0.0–0.5)
LYMPH%: 30.5 % (ref 14.0–49.7)
MCH: 30.6 pg (ref 25.1–34.0)
MCHC: 34.2 g/dL (ref 31.5–36.0)
MCV: 89.6 fL (ref 79.5–101.0)
MONO%: 9.2 % (ref 0.0–14.0)
Platelets: 107 10*3/uL — ABNORMAL LOW (ref 145–400)
RBC: 3.33 10*6/uL — ABNORMAL LOW (ref 3.70–5.45)

## 2012-08-07 MED ORDER — DARBEPOETIN ALFA-POLYSORBATE 300 MCG/0.6ML IJ SOLN
300.0000 ug | Freq: Once | INTRAMUSCULAR | Status: AC
  Administered 2012-08-07: 300 ug via SUBCUTANEOUS
  Filled 2012-08-07: qty 0.6

## 2012-08-07 MED ORDER — CYANOCOBALAMIN 1000 MCG/ML IJ SOLN
1000.0000 ug | INTRAMUSCULAR | Status: DC
  Administered 2012-08-07: 1000 ug via SUBCUTANEOUS

## 2012-09-04 ENCOUNTER — Other Ambulatory Visit (HOSPITAL_BASED_OUTPATIENT_CLINIC_OR_DEPARTMENT_OTHER): Payer: Medicare Other | Admitting: Lab

## 2012-09-04 ENCOUNTER — Ambulatory Visit (HOSPITAL_BASED_OUTPATIENT_CLINIC_OR_DEPARTMENT_OTHER): Payer: Medicare Other

## 2012-09-04 VITALS — BP 168/52 | HR 60 | Temp 97.7°F

## 2012-09-04 DIAGNOSIS — E538 Deficiency of other specified B group vitamins: Secondary | ICD-10-CM

## 2012-09-04 DIAGNOSIS — N189 Chronic kidney disease, unspecified: Secondary | ICD-10-CM

## 2012-09-04 DIAGNOSIS — D519 Vitamin B12 deficiency anemia, unspecified: Secondary | ICD-10-CM

## 2012-09-04 DIAGNOSIS — D696 Thrombocytopenia, unspecified: Secondary | ICD-10-CM

## 2012-09-04 DIAGNOSIS — D631 Anemia in chronic kidney disease: Secondary | ICD-10-CM

## 2012-09-04 LAB — CBC WITH DIFFERENTIAL/PLATELET
BASO%: 0.6 % (ref 0.0–2.0)
LYMPH%: 41.2 % (ref 14.0–49.7)
MCHC: 30.6 g/dL — ABNORMAL LOW (ref 31.5–36.0)
MONO#: 0.4 10*3/uL (ref 0.1–0.9)
Platelets: 115 10*3/uL — ABNORMAL LOW (ref 145–400)
RBC: 3.98 10*6/uL (ref 3.70–5.45)
WBC: 5 10*3/uL (ref 3.9–10.3)

## 2012-09-04 MED ORDER — CYANOCOBALAMIN 1000 MCG/ML IJ SOLN
1000.0000 ug | INTRAMUSCULAR | Status: DC
  Administered 2012-09-04: 1000 ug via SUBCUTANEOUS

## 2012-09-04 MED ORDER — DARBEPOETIN ALFA-POLYSORBATE 500 MCG/ML IJ SOLN: 300.0000 ug | Freq: Once | INTRAMUSCULAR | Status: DC

## 2012-10-02 ENCOUNTER — Ambulatory Visit: Payer: Medicare Other

## 2012-10-02 ENCOUNTER — Other Ambulatory Visit (HOSPITAL_BASED_OUTPATIENT_CLINIC_OR_DEPARTMENT_OTHER): Payer: Medicare Other | Admitting: Lab

## 2012-10-02 ENCOUNTER — Ambulatory Visit (HOSPITAL_BASED_OUTPATIENT_CLINIC_OR_DEPARTMENT_OTHER): Payer: Medicare Other | Admitting: Hematology and Oncology

## 2012-10-02 VITALS — BP 167/70 | HR 67 | Temp 98.2°F | Resp 20 | Ht 63.0 in | Wt 107.3 lb

## 2012-10-02 DIAGNOSIS — I10 Essential (primary) hypertension: Secondary | ICD-10-CM

## 2012-10-02 DIAGNOSIS — N189 Chronic kidney disease, unspecified: Secondary | ICD-10-CM

## 2012-10-02 DIAGNOSIS — E119 Type 2 diabetes mellitus without complications: Secondary | ICD-10-CM

## 2012-10-02 DIAGNOSIS — G4700 Insomnia, unspecified: Secondary | ICD-10-CM

## 2012-10-02 DIAGNOSIS — D519 Vitamin B12 deficiency anemia, unspecified: Secondary | ICD-10-CM

## 2012-10-02 DIAGNOSIS — D696 Thrombocytopenia, unspecified: Secondary | ICD-10-CM

## 2012-10-02 DIAGNOSIS — D631 Anemia in chronic kidney disease: Secondary | ICD-10-CM

## 2012-10-02 DIAGNOSIS — N039 Chronic nephritic syndrome with unspecified morphologic changes: Secondary | ICD-10-CM

## 2012-10-02 DIAGNOSIS — N289 Disorder of kidney and ureter, unspecified: Secondary | ICD-10-CM

## 2012-10-02 DIAGNOSIS — E538 Deficiency of other specified B group vitamins: Secondary | ICD-10-CM

## 2012-10-02 DIAGNOSIS — M25559 Pain in unspecified hip: Secondary | ICD-10-CM

## 2012-10-02 DIAGNOSIS — Z862 Personal history of diseases of the blood and blood-forming organs and certain disorders involving the immune mechanism: Secondary | ICD-10-CM

## 2012-10-02 DIAGNOSIS — R350 Frequency of micturition: Secondary | ICD-10-CM

## 2012-10-02 LAB — CBC WITH DIFFERENTIAL/PLATELET
BASO%: 0.5 % (ref 0.0–2.0)
HCT: 34.6 % — ABNORMAL LOW (ref 34.8–46.6)
MCHC: 30.3 g/dL — ABNORMAL LOW (ref 31.5–36.0)
MONO#: 0.7 10*3/uL (ref 0.1–0.9)
NEUT%: 47.9 % (ref 38.4–76.8)
RBC: 3.75 10*6/uL (ref 3.70–5.45)
WBC: 6.4 10*3/uL (ref 3.9–10.3)
lymph#: 2.4 10*3/uL (ref 0.9–3.3)

## 2012-10-02 MED ORDER — DARBEPOETIN ALFA-POLYSORBATE 300 MCG/0.6ML IJ SOLN
300.0000 ug | Freq: Once | INTRAMUSCULAR | Status: AC
  Administered 2012-10-02: 300 ug via SUBCUTANEOUS
  Filled 2012-10-02: qty 0.6

## 2012-10-02 MED ORDER — CYANOCOBALAMIN 1000 MCG/ML IJ SOLN
1000.0000 ug | Freq: Once | INTRAMUSCULAR | Status: AC
  Administered 2012-10-02: 1000 ug via INTRAMUSCULAR

## 2012-10-02 NOTE — Progress Notes (Signed)
[name] received injections during office visit.

## 2012-10-04 NOTE — Progress Notes (Signed)
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   CURRENT THERAPY: Aranesp 300 mcg subcu every 4 weeks for hemoglobin less than 11 and vitamin B12 1000 mcg subcu monthly. She had been on chronic low-dose prednisone for ITP. Prednisone was discontinued due to pedal edema.   INTERVAL HISTORY:  [name] 79 y.o. female returns for regular follow up visit. She reported to have insomnia. Sometimes she has abdominal pain. When she has constipation milk of magnesium is very helpful. Patient complains about increase in frequency of urination and burning sensation when she urinate occasionally. She still continue to have hip pain after right hip replacement. She reported itching in her back and legs. Patient reported to have colonoscopy 9 years ago and mammogram in November 2013. She does not smoke. Patient denies fever, anorexia, weight loss, headache, visual changes, confusion, drenching night sweats, palpable lymph node swelling, mucositis, odynophagia, dysphagia, nausea vomiting, jaundice, chest pain, palpitation, shortness of breath, dyspnea on exertion, productive cough, gum bleeding, epistaxis, hematemesis, hemoptysis,  abdominal swelling, early satiety, melena, hematochezia, hematuria, skin rash, spontaneous bleeding, heat or cold intolerance, bowel bladder incontinence, paresthesia, depression.   Review of Systems  Constitutional: Negative for fever, chills, weight loss, malaise/fatigue and diaphoresis.  HENT: Negative for hearing loss, nosebleeds, congestion, sore throat, neck pain, tinnitus and ear discharge.   Eyes: Negative for blurred vision and double vision.  Respiratory: Negative for cough, hemoptysis, sputum production, shortness of breath and wheezing.   Cardiovascular: Negative for chest pain,  palpitations, orthopnea and PND.  Gastrointestinal: Positive for abdominal pain and constipation. Negative for nausea, vomiting, diarrhea and blood in stool.  Genitourinary: Positive for dysuria and frequency. Negative for urgency and hematuria.  Musculoskeletal: Positive for joint pain. Negative for myalgias and back pain.  Skin: Positive for itching. Negative for rash.  Neurological: Negative for dizziness, tingling, sensory change, seizures, loss of consciousness and headaches.  Endo/Heme/Allergies: Does not bruise/bleed easily.  Psychiatric/Behavioral: Negative for depression, hallucinations and substance abuse. The patient has insomnia. The patient is not nervous/anxious.    PAST MEDICAL HISTORY: Past Medical History  Diagnosis Date  . Hypertension   . Anxiety and depression   . Chronic headaches   . Diverticular disease   . Anemia   . Arthritis     osteoarthritis  . Osteoporosis   . Vitamin B 12 deficiency   . History of ITP   . Anxiety     takes xanax for sleep  . Diabetes mellitus     NIDDM x 12 years  . Heart murmur     benign;asymptomatic  . Chronic kidney disease     esrd    PAST SURGICAL HISTORY: Past Surgical History  Procedure Laterality Date  . Cholecystectomy    . Av fistula placement  11/15/10    left upper arm AVF  . Abdominal hysterectomy  1973  . Joint replacement  2007    rt hip  . Eye surgery    . Cataract extraction w/ intraocular lens  implant, bilateral  2002  . Vascular surgery  10-2010    left AVF  . Hernia repair  2000    ventral  . Ventral hernia repair  08/06/2011    Procedure: HERNIA REPAIR VENTRAL ADULT;  Surgeon: Liz Malady, MD;  Location: [facility] OR;  Service: General;  Laterality:  N/A;  Open Repair [employer] Hernia with Mesh    FAMILY HISTORY Family History  Problem Relation Age of Onset  . Stroke Mother   . Heart disease Father     HEALTH MAINTENANCE: History  Substance Use Topics  . Smoking status: Never Smoker   .  Smokeless tobacco: Current User    Types: Snuff  . Alcohol Use: No   No Known Allergies  Current Outpatient Prescriptions  Medication Sig Dispense Refill  . acetaminophen (TYLENOL) 500 MG tablet Take 500 mg by mouth 2 (two) times daily as needed. For pain.      Marland Kitchen albuterol (PROVENTIL) (2.5 MG/3ML) 0.083% nebulizer solution Take 2.5 mg by nebulization every 6 (six) hours as needed for wheezing.      Marland Kitchen antiseptic oral rinse (BIOTENE) LIQD 15 mLs by Mouth Rinse route QID.      Marland Kitchen atenolol (TENORMIN) 25 MG tablet Take 25 mg by mouth 2 (two) times daily.       Marland Kitchen atorvastatin (LIPITOR) 20 MG tablet Take 20 mg by mouth. daily      . cloNIDine (CATAPRES) 0.1 MG tablet Take 0.1 mg by mouth 2 (two) times daily.        . cyanocobalamin (,VITAMIN B-12,) 1000 MCG/ML injection Inject 1,000 mcg into the muscle every 30 (thirty) days.       . darbepoetin (ARANESP) 150 MCG/0.3ML SOLN Inject 150 mcg into the skin every 30 (thirty) days. As needed. Only receives if blood test is low      . furosemide (LASIX) 20 MG tablet Take 20 mg by mouth daily.       . mirtazapine (REMERON) 15 MG tablet Take 15 mg by mouth Daily.      Marland Kitchen oxybutynin (DITROPAN) 5 MG tablet Take 5 mg by mouth daily.       Marland Kitchen levETIRAcetam (KEPPRA) 250 MG tablet TAKE 1 TABLET BY MOUTH TWICE A DAY  180 tablet  1  . metoprolol tartrate (LOPRESSOR) 25 mg/10 mL SUSP Place 5 mLs (12.5 mg total) into feeding tube 2 (two) times daily.      . pioglitazone (ACTOS) 30 MG tablet Take 30 mg by mouth daily.       . simvastatin (ZOCOR) 40 MG tablet Take 40 mg by mouth at bedtime.         No current facility-administered medications for this visit.    OBJECTIVE: Filed Vitals:   10/02/12 0934  BP: 167/70  Pulse: 67  Temp: 98.2 F (36.8 C)  Resp: 20     Body mass index is 19.01 kg/(m^2).    ECOG FS: 2  HEENT: Sclerae anicteric.  Conjunctivae were pink. Pupils round and reactive bilaterally. Oral mucosa is moist without ulceration or thrush. No  occipital, submandibular, cervical, supraclavicular or axillar adenopathy. Lungs: clear to auscultation without wheezes. No rales or rhonchi. Heart: regular rate and rhythm. 3/6 systolic murmur. Abdomen: soft, non tender. No guarding or rebound tenderness. Bowel sounds are present. No palpable hepatosplenomegaly. MSK: no focal spinal tenderness. Extremities: No clubbing or cyanosis.No calf tenderness to palpitation, no peripheral edema. The patient had grossly intact strength in upper and lower extremities Neuro: non-focal, alert and oriented to time, person and place, appropriate affect  LAB RESULTS:  CMP     Component Value Date/Time   NA 142 07/10/2012 0909   NA 141 08/26/2011 0500   K 5.5* 07/10/2012 0909   K 4.1 08/26/2011 0500   CL 106 07/10/2012 0909   CL 106 08/26/2011  0500   CO2 27 07/10/2012 0909   CO2 22 08/26/2011 0500   GLUCOSE 102* 07/10/2012 0909   GLUCOSE 220* 08/26/2011 0500   BUN 47.4* 07/10/2012 0909   BUN 75* 08/26/2011 0500   CREATININE 2.6* 07/10/2012 0909   CREATININE 2.88* 08/26/2011 0500   CALCIUM 9.1 07/10/2012 0909   CALCIUM 8.0* 08/26/2011 0500   PROT 6.1* 07/10/2012 0909   PROT 4.3* 08/21/2011 0439   ALBUMIN 3.2* 07/10/2012 0909   ALBUMIN 1.6* 08/25/2011 0526   AST 13 07/10/2012 0909   AST 120* 08/21/2011 0439   ALT 7 07/10/2012 0909   ALT 90* 08/21/2011 0439   ALKPHOS 65 07/10/2012 0909   ALKPHOS 64 08/21/2011 0439   BILITOT 0.45 07/10/2012 0909   BILITOT 0.4 08/21/2011 0439   GFRNONAA 15* 08/26/2011 0500   GFRAA 17* 08/26/2011 0500    Lab Results  Component Value Date   WBC 6.4 10/02/2012   NEUTROABS 3.1 10/02/2012   HGB 10.5* 10/02/2012   HCT 34.6* 10/02/2012   MCV 92.3 10/02/2012   PLT 108* 10/02/2012      Chemistry      Component Value Date/Time   NA 142 07/10/2012 0909   NA 141 08/26/2011 0500   K 5.5* 07/10/2012 0909   K 4.1 08/26/2011 0500   CL 106 07/10/2012 0909   CL 106 08/26/2011 0500   CO2 27 07/10/2012 0909   CO2 22 08/26/2011 0500   BUN 47.4* 07/10/2012 0909   BUN 75*  08/26/2011 0500   CREATININE 2.6* 07/10/2012 0909   CREATININE 2.88* 08/26/2011 0500      Component Value Date/Time   CALCIUM 9.1 07/10/2012 0909   CALCIUM 8.0* 08/26/2011 0500   ALKPHOS 65 07/10/2012 0909   ALKPHOS 64 08/21/2011 0439   AST 13 07/10/2012 0909   AST 120* 08/21/2011 0439   ALT 7 07/10/2012 0909   ALT 90* 08/21/2011 0439   BILITOT 0.45 07/10/2012 0909   BILITOT 0.4 08/21/2011 0439       ASSESSMENT AND PLAN:  1. History of Vit B12 deficiency. She continues to receive monthly vitamin B12 1000 mcg IM.  Serum vitamin B12 level will be checked. 2. Renal insufficiency secondary to most likely diabetes and hypertension. She follows with nephrology.  3. Anemia of chronic disease secondary to renal insufficiency. She has been receiving Aranesp 300 mcg subcu every 4 weeks for hemoglobin less than 11.   Iron panel will be done. 4. Thrombocytopenia from presumed ITP. She was on Prednisone. This was discontinued when she developed pedal edema. Platelet count is 108,000 today. 5. Diabetes type 2. Management per PCP.  6. Hyperlipidemia. Management per PCP. 7. Hypertension. Management per PCP.  8. Insomnia. Management per PCP. 9. Follow-up: In about 6 months.  The length of time of the face-to-face encounter was 15 minutes. More than 50% of time was spent counseling and coordination of care.   Cc: Delorse Lek, MD   Myra Rude, MD   10/04/2012 8:58 PM

## 2012-10-06 ENCOUNTER — Telehealth: Payer: Self-pay | Admitting: *Deleted

## 2012-10-06 NOTE — Telephone Encounter (Signed)
Lm gv appt for 03/22/13 ov @10 :30am and inj @ 11:15am. i made pt aware that i will mail a letter/avs as well...td

## 2012-10-15 ENCOUNTER — Telehealth: Payer: Self-pay | Admitting: Dietician

## 2012-10-16 ENCOUNTER — Telehealth: Payer: Self-pay | Admitting: Dietician

## 2012-10-27 ENCOUNTER — Telehealth: Payer: Self-pay | Admitting: Hematology and Oncology

## 2012-10-27 NOTE — Telephone Encounter (Signed)
Returned call re r/s 9/5 appt. Pt asked to call back to let us know when she could come in and also inform via vm that remaining appts may also need to be r/s.

## 2012-10-30 ENCOUNTER — Ambulatory Visit: Payer: Medicare Other

## 2012-10-30 ENCOUNTER — Other Ambulatory Visit: Payer: Medicare Other | Admitting: Lab

## 2012-11-13 ENCOUNTER — Other Ambulatory Visit: Payer: Self-pay | Admitting: *Deleted

## 2012-11-13 DIAGNOSIS — D519 Vitamin B12 deficiency anemia, unspecified: Secondary | ICD-10-CM

## 2012-11-13 DIAGNOSIS — D696 Thrombocytopenia, unspecified: Secondary | ICD-10-CM

## 2012-11-13 DIAGNOSIS — D631 Anemia in chronic kidney disease: Secondary | ICD-10-CM

## 2012-11-13 DIAGNOSIS — N189 Chronic kidney disease, unspecified: Secondary | ICD-10-CM

## 2012-11-16 ENCOUNTER — Ambulatory Visit (HOSPITAL_BASED_OUTPATIENT_CLINIC_OR_DEPARTMENT_OTHER): Payer: Medicare Other

## 2012-11-16 ENCOUNTER — Other Ambulatory Visit (HOSPITAL_BASED_OUTPATIENT_CLINIC_OR_DEPARTMENT_OTHER): Payer: Medicare Other | Admitting: Lab

## 2012-11-16 VITALS — BP 148/60 | HR 61 | Temp 98.0°F

## 2012-11-16 DIAGNOSIS — D649 Anemia, unspecified: Secondary | ICD-10-CM

## 2012-11-16 DIAGNOSIS — N189 Chronic kidney disease, unspecified: Secondary | ICD-10-CM

## 2012-11-16 DIAGNOSIS — D631 Anemia in chronic kidney disease: Secondary | ICD-10-CM

## 2012-11-16 DIAGNOSIS — D519 Vitamin B12 deficiency anemia, unspecified: Secondary | ICD-10-CM

## 2012-11-16 DIAGNOSIS — E538 Deficiency of other specified B group vitamins: Secondary | ICD-10-CM

## 2012-11-16 DIAGNOSIS — D696 Thrombocytopenia, unspecified: Secondary | ICD-10-CM

## 2012-11-16 LAB — COMPREHENSIVE METABOLIC PANEL (CC13)
ALT: 15 U/L (ref 0–55)
AST: 18 U/L (ref 5–34)
Albumin: 3.4 g/dL — ABNORMAL LOW (ref 3.5–5.0)
Calcium: 8.9 mg/dL (ref 8.4–10.4)
Chloride: 103 mEq/L (ref 98–109)
Creatinine: 2.7 mg/dL — ABNORMAL HIGH (ref 0.6–1.1)
Potassium: 5.4 mEq/L — ABNORMAL HIGH (ref 3.5–5.1)
Sodium: 142 mEq/L (ref 136–145)
Total Protein: 6.4 g/dL (ref 6.4–8.3)

## 2012-11-16 LAB — CBC WITH DIFFERENTIAL/PLATELET
BASO%: 0.7 % (ref 0.0–2.0)
Basophils Absolute: 0.1 10*3/uL (ref 0.0–0.1)
Eosinophils Absolute: 0.1 10*3/uL (ref 0.0–0.5)
HCT: 34.6 % — ABNORMAL LOW (ref 34.8–46.6)
HGB: 11.1 g/dL — ABNORMAL LOW (ref 11.6–15.9)
LYMPH%: 25.4 % (ref 14.0–49.7)
MCV: 90.7 fL (ref 79.5–101.0)
MONO#: 0.7 10*3/uL (ref 0.1–0.9)
NEUT#: 4.1 10*3/uL (ref 1.5–6.5)
NEUT%: 60.9 % (ref 38.4–76.8)
Platelets: 114 10*3/uL — ABNORMAL LOW (ref 145–400)
RBC: 3.81 10*6/uL (ref 3.70–5.45)
WBC: 6.8 10*3/uL (ref 3.9–10.3)

## 2012-11-16 LAB — IRON AND TIBC CHCC
%SAT: 61 % — ABNORMAL HIGH (ref 21–57)
UIBC: 77 ug/dL — ABNORMAL LOW (ref 120–384)

## 2012-11-16 LAB — FERRITIN CHCC: Ferritin: 825 ng/ml — ABNORMAL HIGH (ref 9–269)

## 2012-11-16 MED ORDER — CYANOCOBALAMIN 1000 MCG/ML IJ SOLN
1000.0000 ug | INTRAMUSCULAR | Status: DC
  Administered 2012-11-16: 1000 ug via SUBCUTANEOUS

## 2012-11-16 MED ORDER — DARBEPOETIN ALFA-POLYSORBATE 500 MCG/ML IJ SOLN: 300.0000 ug | Freq: Once | INTRAMUSCULAR | Status: DC

## 2012-11-27 ENCOUNTER — Ambulatory Visit: Payer: Medicare Other

## 2012-11-27 ENCOUNTER — Other Ambulatory Visit: Payer: Medicare Other | Admitting: Lab

## 2012-12-11 ENCOUNTER — Other Ambulatory Visit: Payer: Self-pay | Admitting: Hematology and Oncology

## 2012-12-11 DIAGNOSIS — D519 Vitamin B12 deficiency anemia, unspecified: Secondary | ICD-10-CM

## 2012-12-11 DIAGNOSIS — D696 Thrombocytopenia, unspecified: Secondary | ICD-10-CM

## 2012-12-14 ENCOUNTER — Encounter (INDEPENDENT_AMBULATORY_CARE_PROVIDER_SITE_OTHER): Payer: Self-pay

## 2012-12-14 ENCOUNTER — Ambulatory Visit (HOSPITAL_BASED_OUTPATIENT_CLINIC_OR_DEPARTMENT_OTHER): Payer: Medicare Other

## 2012-12-14 ENCOUNTER — Other Ambulatory Visit (HOSPITAL_BASED_OUTPATIENT_CLINIC_OR_DEPARTMENT_OTHER): Payer: Medicare Other | Admitting: Lab

## 2012-12-14 ENCOUNTER — Other Ambulatory Visit: Payer: Self-pay | Admitting: Hematology and Oncology

## 2012-12-14 VITALS — BP 179/59 | HR 72 | Temp 97.9°F

## 2012-12-14 DIAGNOSIS — D631 Anemia in chronic kidney disease: Secondary | ICD-10-CM

## 2012-12-14 DIAGNOSIS — N289 Disorder of kidney and ureter, unspecified: Secondary | ICD-10-CM

## 2012-12-14 DIAGNOSIS — E538 Deficiency of other specified B group vitamins: Secondary | ICD-10-CM

## 2012-12-14 DIAGNOSIS — D696 Thrombocytopenia, unspecified: Secondary | ICD-10-CM

## 2012-12-14 DIAGNOSIS — N189 Chronic kidney disease, unspecified: Secondary | ICD-10-CM

## 2012-12-14 DIAGNOSIS — D519 Vitamin B12 deficiency anemia, unspecified: Secondary | ICD-10-CM

## 2012-12-14 DIAGNOSIS — D649 Anemia, unspecified: Secondary | ICD-10-CM

## 2012-12-14 LAB — CBC WITH DIFFERENTIAL/PLATELET
BASO%: 0.5 % (ref 0.0–2.0)
Basophils Absolute: 0 10*3/uL (ref 0.0–0.1)
EOS%: 3.2 % (ref 0.0–7.0)
Eosinophils Absolute: 0.2 10*3/uL (ref 0.0–0.5)
HCT: 30.2 % — ABNORMAL LOW (ref 34.8–46.6)
HGB: 9.6 g/dL — ABNORMAL LOW (ref 11.6–15.9)
MCH: 28.7 pg (ref 25.1–34.0)
NEUT#: 3.2 10*3/uL (ref 1.5–6.5)
NEUT%: 51.9 % (ref 38.4–76.8)
RBC: 3.35 10*6/uL — ABNORMAL LOW (ref 3.70–5.45)
RDW: 13.4 % (ref 11.2–14.5)
WBC: 6.2 10*3/uL (ref 3.9–10.3)
lymph#: 2.2 10*3/uL (ref 0.9–3.3)
nRBC: 0 % (ref 0–0)

## 2012-12-14 LAB — COMPREHENSIVE METABOLIC PANEL (CC13)
AST: 12 U/L (ref 5–34)
Albumin: 3.3 g/dL — ABNORMAL LOW (ref 3.5–5.0)
Alkaline Phosphatase: 77 U/L (ref 40–150)
Anion Gap: 9 mEq/L (ref 3–11)
BUN: 41.9 mg/dL — ABNORMAL HIGH (ref 7.0–26.0)
CO2: 25 mEq/L (ref 22–29)
Calcium: 9.1 mg/dL (ref 8.4–10.4)
Chloride: 108 mEq/L (ref 98–109)
Creatinine: 2.3 mg/dL — ABNORMAL HIGH (ref 0.6–1.1)
Glucose: 96 mg/dl (ref 70–140)
Potassium: 5.2 mEq/L — ABNORMAL HIGH (ref 3.5–5.1)
Sodium: 142 mEq/L (ref 136–145)
Total Bilirubin: 0.43 mg/dL (ref 0.20–1.20)

## 2012-12-14 LAB — RETICULOCYTES
Immature Retic Fract: 1.6 % (ref 1.60–10.00)
RBC: 3.31 10*6/uL — ABNORMAL LOW (ref 3.70–5.45)
Retic %: 0.63 % — ABNORMAL LOW (ref 0.70–2.10)
Retic Ct Abs: 20.85 10*3/uL — ABNORMAL LOW (ref 33.70–90.70)

## 2012-12-14 LAB — IRON AND TIBC CHCC
%SAT: 47 % (ref 21–57)
TIBC: 199 ug/dL — ABNORMAL LOW (ref 236–444)

## 2012-12-14 LAB — CHCC SMEAR

## 2012-12-14 MED ORDER — CYANOCOBALAMIN 1000 MCG/ML IJ SOLN
1000.0000 ug | INTRAMUSCULAR | Status: DC
  Administered 2012-12-14: 1000 ug via SUBCUTANEOUS

## 2012-12-14 MED ORDER — DARBEPOETIN ALFA-POLYSORBATE 500 MCG/ML IJ SOLN
300.0000 ug | Freq: Once | INTRAMUSCULAR | Status: AC
  Administered 2012-12-14: 300 ug via SUBCUTANEOUS
  Filled 2012-12-14: qty 1

## 2012-12-15 ENCOUNTER — Telehealth: Payer: Self-pay | Admitting: Hematology and Oncology

## 2012-12-15 NOTE — Telephone Encounter (Signed)
lvm for pt regarding to NOV appt with MD visit added on

## 2012-12-16 LAB — SPEP & IFE WITH QIG
Albumin ELP: 57.4 % (ref 55.8–66.1)
Alpha-1-Globulin: 6.6 % — ABNORMAL HIGH (ref 2.9–4.9)
Beta 2: 5 % (ref 3.2–6.5)
Gamma Globulin: 12.4 % (ref 11.1–18.8)
IgA: 277 mg/dL (ref 69–380)
IgG (Immunoglobin G), Serum: 806 mg/dL (ref 690–1700)
IgM, Serum: 24 mg/dL — ABNORMAL LOW (ref 52–322)
Total Protein, Serum Electrophoresis: 6.1 g/dL (ref 6.0–8.3)

## 2012-12-16 LAB — VITAMIN B12: Vitamin B-12: 933 pg/mL — ABNORMAL HIGH (ref 211–911)

## 2012-12-16 LAB — KAPPA/LAMBDA LIGHT CHAINS
Kappa:Lambda Ratio: 1.69 — ABNORMAL HIGH (ref 0.26–1.65)
Lambda Free Lght Chn: 3.04 mg/dL — ABNORMAL HIGH (ref 0.57–2.63)

## 2012-12-16 LAB — ERYTHROPOIETIN: Erythropoietin: 6.4 m[IU]/mL (ref 2.6–18.5)

## 2012-12-16 LAB — SEDIMENTATION RATE: Sed Rate: 22 mm/hr (ref 0–22)

## 2012-12-25 ENCOUNTER — Other Ambulatory Visit: Payer: Medicare Other | Admitting: Lab

## 2012-12-25 ENCOUNTER — Ambulatory Visit: Payer: Medicare Other

## 2013-01-11 ENCOUNTER — Encounter: Payer: Self-pay | Admitting: Hematology and Oncology

## 2013-01-11 ENCOUNTER — Other Ambulatory Visit (HOSPITAL_BASED_OUTPATIENT_CLINIC_OR_DEPARTMENT_OTHER): Payer: Medicare Other | Admitting: Lab

## 2013-01-11 ENCOUNTER — Ambulatory Visit (HOSPITAL_BASED_OUTPATIENT_CLINIC_OR_DEPARTMENT_OTHER): Payer: Medicare Other | Admitting: Hematology and Oncology

## 2013-01-11 ENCOUNTER — Ambulatory Visit: Payer: Medicare Other

## 2013-01-11 ENCOUNTER — Telehealth: Payer: Self-pay | Admitting: Hematology and Oncology

## 2013-01-11 VITALS — BP 143/72 | HR 61 | Temp 97.6°F | Resp 18 | Ht 63.0 in | Wt 104.6 lb

## 2013-01-11 DIAGNOSIS — D631 Anemia in chronic kidney disease: Secondary | ICD-10-CM

## 2013-01-11 DIAGNOSIS — R300 Dysuria: Secondary | ICD-10-CM

## 2013-01-11 DIAGNOSIS — F17200 Nicotine dependence, unspecified, uncomplicated: Secondary | ICD-10-CM

## 2013-01-11 DIAGNOSIS — D519 Vitamin B12 deficiency anemia, unspecified: Secondary | ICD-10-CM

## 2013-01-11 DIAGNOSIS — D638 Anemia in other chronic diseases classified elsewhere: Secondary | ICD-10-CM

## 2013-01-11 DIAGNOSIS — D518 Other vitamin B12 deficiency anemias: Secondary | ICD-10-CM

## 2013-01-11 LAB — CBC WITH DIFFERENTIAL/PLATELET
BASO%: 1.2 % (ref 0.0–2.0)
Eosinophils Absolute: 0.1 10*3/uL (ref 0.0–0.5)
MCH: 29 pg (ref 25.1–34.0)
MCHC: 31.8 g/dL (ref 31.5–36.0)
MCV: 91 fL (ref 79.5–101.0)
MONO#: 0.5 10*3/uL (ref 0.1–0.9)
MONO%: 6.6 % (ref 0.0–14.0)
NEUT#: 4.4 10*3/uL (ref 1.5–6.5)
Platelets: 169 10*3/uL (ref 145–400)
RBC: 3.71 10*6/uL (ref 3.70–5.45)
RDW: 14.4 % (ref 11.2–14.5)
WBC: 6.9 10*3/uL (ref 3.9–10.3)
lymph#: 1.9 10*3/uL (ref 0.9–3.3)

## 2013-01-11 MED ORDER — VITAMIN B-12 1000 MCG PO TABS: 1000.0000 ug | ORAL_TABLET | Freq: Every day | ORAL | Status: DC

## 2013-01-11 NOTE — Progress Notes (Signed)
[facility] OFFICE PROGRESS NOTE  BURNETT,[name], MD DIAGNOSIS:  B12 deficiency and anemia chronic disease  SUMMARY OF HEMATOLOGIC HISTORY: This is a pleasant 79 year old lady was found to have anemia. Previously she was found to have B12 deficiency and had been receiving B12 injection monthly. For the anemia chronic disease she was getting Aranesp. She was in a mild thrombocytopenia which resolved with low-dose prednisone. INTERVAL HISTORY: [name] 79 y.o. female returns for further followup. The patient denies any recent signs or symptoms of bleeding such as spontaneous epistaxis, hematuria or hematochezia. She complained of fatigue. Denies any shortness of breath, dizziness or chest pain on exertion. Her only main complaint today is mild dysuria. There were no associated nausea or fever. I have reviewed the past medical history, past surgical history, social history and family history with the patient and they are unchanged from previous note.  ALLERGIES:  has No Known Allergies.  MEDICATIONS:  Current Outpatient Prescriptions  Medication Sig Dispense Refill  . acetaminophen (TYLENOL) 500 MG tablet Take 500 mg by mouth 2 (two) times daily as needed. For pain.      Marland Kitchen albuterol (PROVENTIL) (2.5 MG/3ML) 0.083% nebulizer solution Take 2.5 mg by nebulization every 6 (six) hours as needed for wheezing.      Marland Kitchen antiseptic oral rinse (BIOTENE) LIQD 15 mLs by Mouth Rinse route QID.      Marland Kitchen atenolol (TENORMIN) 25 MG tablet Take 25 mg by mouth 2 (two) times daily.       Marland Kitchen atorvastatin (LIPITOR) 20 MG tablet Take 20 mg by mouth. daily      . cloNIDine (CATAPRES) 0.1 MG tablet Take 0.1 mg by mouth 2 (two) times daily.        . darbepoetin (ARANESP) 150 MCG/0.3ML SOLN Inject 150 mcg into the skin every 30 (thirty) days. As needed. Only receives if blood test is low      . furosemide (LASIX) 20 MG tablet Take 20 mg by mouth daily.       Marland Kitchen levETIRAcetam (KEPPRA) 250 MG tablet TAKE  1 TABLET BY MOUTH TWICE A DAY  180 tablet  1  . metoprolol tartrate (LOPRESSOR) 25 mg/10 mL SUSP Place 5 mLs (12.5 mg total) into feeding tube 2 (two) times daily.      . mirtazapine (REMERON) 15 MG tablet Take 15 mg by mouth Daily.      Marland Kitchen oxybutynin (DITROPAN) 5 MG tablet Take 5 mg by mouth daily.       . pioglitazone (ACTOS) 30 MG tablet Take 30 mg by mouth daily.       . simvastatin (ZOCOR) 40 MG tablet Take 40 mg by mouth at bedtime.        . vitamin B-12 (CYANOCOBALAMIN) 1000 MCG tablet Take 1 tablet (1,000 mcg total) by mouth daily.  30 tablet  6   No current facility-administered medications for this visit.     REVIEW OF SYSTEMS:   Constitutional: Denies fevers, chills or night sweats Eyes: Denies blurriness of vision Ears, nose, mouth, throat, and face: Denies mucositis or sore throat Respiratory: Denies cough, dyspnea or wheezes Cardiovascular: Denies palpitation, chest discomfort or lower extremity swelling Gastrointestinal:  Denies nausea, heartburn or change in bowel habits Skin: Denies abnormal skin rashes Lymphatics: Denies new lymphadenopathy or easy bruising Neurological:Denies numbness, tingling or new weaknesses Behavioral/Psych: Mood is stable, no new changes  All other systems were reviewed with the patient and are negative.  PHYSICAL EXAMINATION: ECOG PERFORMANCE STATUS: 1 -  Symptomatic but completely ambulatory  Filed Vitals:   01/11/13 0932  BP: 143/72  Pulse: 61  Temp: 97.6 F (36.4 C)  Resp: 18   Filed Weights   01/11/13 0932  Weight: 104 lb 9.6 oz (47.446 kg)    GENERAL:alert, no distress and comfortable. She looks thin and cachectic SKIN: skin color, texture, turgor are normal, no rashes or significant lesions EYES: normal, Conjunctiva are pink and non-injected, sclera clear OROPHARYNX:no exudate, no erythema and lips, buccal mucosa, and tongue normal . Poor dentition is noted NECK: supple, thyroid normal size, non-tender, without  nodularity LYMPH:  no palpable lymphadenopathy in the cervical, axillary or inguinal LUNGS: clear to auscultation and percussion with normal breathing effort HEART: regular rate & rhythm and no murmurs and no lower extremity edema ABDOMEN:abdomen soft, non-tender and normal bowel sounds Musculoskeletal:no cyanosis of digits and no clubbing  NEURO: alert & oriented x 3 with fluent speech, no focal motor/sensory deficits  LABORATORY DATA:  I have reviewed the data as listed Lab Results  Component Value Date   WBC 6.9 01/11/2013   HGB 10.8* 01/11/2013   HCT 33.8* 01/11/2013   MCV 91.0 01/11/2013   PLT 169 01/11/2013    ASSESSMENT & PLAN:  #1 B12 deficiency Her most recent B12 is adequately replaced. I recommend oral B12 supplements. She agreed. #2 anemia chronic disease Her hemoglobin is almost 11. I am holding Aranesp injection today #3 dysuria I recommend urinalysis and culture. Unfortunately she had problems with incontinence. [name] going to followup with her primary care provider for this. #4 chewing snuff The patient is not interested to quit. I spent 15 minutes counseling the patient face to face. The total time spent in the appointment was 20 minutes and more than 50% was on counseling.     [name], MD 01/11/2013 9:52 AM

## 2013-01-22 ENCOUNTER — Other Ambulatory Visit: Payer: Medicare Other | Admitting: Lab

## 2013-01-22 ENCOUNTER — Ambulatory Visit: Payer: Medicare Other

## 2013-02-08 ENCOUNTER — Encounter: Payer: Self-pay | Admitting: Hematology and Oncology

## 2013-02-08 ENCOUNTER — Ambulatory Visit (HOSPITAL_BASED_OUTPATIENT_CLINIC_OR_DEPARTMENT_OTHER): Payer: Medicare Other

## 2013-02-08 ENCOUNTER — Other Ambulatory Visit (HOSPITAL_BASED_OUTPATIENT_CLINIC_OR_DEPARTMENT_OTHER): Payer: Medicare Other

## 2013-02-08 ENCOUNTER — Ambulatory Visit (HOSPITAL_BASED_OUTPATIENT_CLINIC_OR_DEPARTMENT_OTHER): Payer: Medicare Other | Admitting: Hematology and Oncology

## 2013-02-08 ENCOUNTER — Other Ambulatory Visit: Payer: Medicare Other | Admitting: Lab

## 2013-02-08 ENCOUNTER — Telehealth: Payer: Self-pay | Admitting: Hematology and Oncology

## 2013-02-08 VITALS — BP 152/49 | HR 60 | Temp 97.3°F | Resp 18 | Ht 63.0 in | Wt 105.8 lb

## 2013-02-08 DIAGNOSIS — D519 Vitamin B12 deficiency anemia, unspecified: Secondary | ICD-10-CM

## 2013-02-08 DIAGNOSIS — D631 Anemia in chronic kidney disease: Secondary | ICD-10-CM

## 2013-02-08 DIAGNOSIS — M25562 Pain in left knee: Secondary | ICD-10-CM | POA: Insufficient documentation

## 2013-02-08 DIAGNOSIS — N189 Chronic kidney disease, unspecified: Secondary | ICD-10-CM

## 2013-02-08 DIAGNOSIS — M25569 Pain in unspecified knee: Secondary | ICD-10-CM

## 2013-02-08 DIAGNOSIS — D638 Anemia in other chronic diseases classified elsewhere: Secondary | ICD-10-CM

## 2013-02-08 DIAGNOSIS — E538 Deficiency of other specified B group vitamins: Secondary | ICD-10-CM

## 2013-02-08 DIAGNOSIS — R630 Anorexia: Secondary | ICD-10-CM

## 2013-02-08 DIAGNOSIS — R64 Cachexia: Secondary | ICD-10-CM

## 2013-02-08 HISTORY — DX: Pain in left knee: M25.562

## 2013-02-08 HISTORY — DX: Cachexia: R64

## 2013-02-08 LAB — COMPREHENSIVE METABOLIC PANEL (CC13)
ALT: 9 U/L (ref 0–55)
Anion Gap: 8 mEq/L (ref 3–11)
BUN: 49.4 mg/dL — ABNORMAL HIGH (ref 7.0–26.0)
CO2: 26 mEq/L (ref 22–29)
Calcium: 9.2 mg/dL (ref 8.4–10.4)
Chloride: 106 mEq/L (ref 98–109)
Creatinine: 2.5 mg/dL — ABNORMAL HIGH (ref 0.6–1.1)
Glucose: 94 mg/dl (ref 70–140)
Total Bilirubin: 0.44 mg/dL (ref 0.20–1.20)
Total Protein: 6.3 g/dL — ABNORMAL LOW (ref 6.4–8.3)

## 2013-02-08 LAB — CBC WITH DIFFERENTIAL/PLATELET
BASO%: 0.9 % (ref 0.0–2.0)
Basophils Absolute: 0.1 10*3/uL (ref 0.0–0.1)
Eosinophils Absolute: 0.2 10*3/uL (ref 0.0–0.5)
HCT: 29.4 % — ABNORMAL LOW (ref 34.8–46.6)
HGB: 9.7 g/dL — ABNORMAL LOW (ref 11.6–15.9)
LYMPH%: 33.7 % (ref 14.0–49.7)
MONO#: 0.5 10*3/uL (ref 0.1–0.9)
NEUT#: 3 10*3/uL (ref 1.5–6.5)
NEUT%: 52.2 % (ref 38.4–76.8)
Platelets: 104 10*3/uL — ABNORMAL LOW (ref 145–400)
WBC: 5.7 10*3/uL (ref 3.9–10.3)
lymph#: 1.9 10*3/uL (ref 0.9–3.3)

## 2013-02-08 MED ORDER — DARBEPOETIN ALFA-POLYSORBATE 300 MCG/0.6ML IJ SOLN
300.0000 ug | Freq: Once | INTRAMUSCULAR | Status: AC
  Administered 2013-02-08: 300 ug via SUBCUTANEOUS
  Filled 2013-02-08: qty 1

## 2013-02-08 MED ORDER — MEGESTROL ACETATE 40 MG PO TABS: 40.0000 mg | ORAL_TABLET | Freq: Two times a day (BID) | ORAL | Status: DC

## 2013-02-08 NOTE — Progress Notes (Signed)
[name] OFFICE PROGRESS NOTE  Patient Care Team: Delorse Lek, MD as PCP - General (Family Medicine) Sadie Haber, MD (Nephrology)  DIAGNOSIS: History of B12 deficiency and anemia chronic disease  SUMMARY OF ONCOLOGIC HISTORY: This is a pleasant 80 year old lady was found to have anemia. Previously she was found to have B12 deficiency and had been receiving B12 injection monthly. For the anemia chronic disease she was getting Aranesp. She was in a mild thrombocytopenia which resolved with low-dose prednisone.  INTERVAL HISTORY: [name] 79 y.o. female returns for further followup. She complained of poor appetite. She was stopped on Remeron because she felt it was not helpful. [name] complaining of severe left knee pain. This has been going on for many months but she was not refer to see anybody else and have not have an x-ray or imaging study done on the left knee. The patient denies any recent signs or symptoms of bleeding such as spontaneous epistaxis, hematuria or hematochezia. She denies any recent fever, chills, night sweats or abnormal weight loss   I have reviewed the past medical history, past surgical history, social history and family history with the patient and they are unchanged from previous note.  ALLERGIES:  has No Known Allergies.  MEDICATIONS:  Current Outpatient Prescriptions  Medication Sig Dispense Refill  . acetaminophen (TYLENOL) 500 MG tablet Take 500 mg by mouth 2 (two) times daily as needed. For pain.      Marland Kitchen antiseptic oral rinse (BIOTENE) LIQD 15 mLs by Mouth Rinse route QID.      Marland Kitchen atenolol (TENORMIN) 25 MG tablet Take 25 mg by mouth 2 (two) times daily.       Marland Kitchen atorvastatin (LIPITOR) 20 MG tablet Take 20 mg by mouth. daily      . cloNIDine (CATAPRES) 0.1 MG tablet Take 0.1 mg by mouth 2 (two) times daily.        . darbepoetin (ARANESP) 150 MCG/0.3ML SOLN Inject 150 mcg into the skin every 30 (thirty) days. As needed. Only  receives if blood test is low      . furosemide (LASIX) 20 MG tablet Take 20 mg by mouth daily.       Marland Kitchen levETIRAcetam (KEPPRA) 250 MG tablet TAKE 1 TABLET BY MOUTH TWICE A DAY  180 tablet  1  . metoprolol tartrate (LOPRESSOR) 25 mg/10 mL SUSP Place 5 mLs (12.5 mg total) into feeding tube 2 (two) times daily.      Marland Kitchen oxybutynin (DITROPAN) 5 MG tablet Take 5 mg by mouth daily.       . vitamin B-12 (CYANOCOBALAMIN) 1000 MCG tablet Take 1 tablet (1,000 mcg total) by mouth daily.  30 tablet  6  . megestrol (MEGACE) 40 MG tablet Take 1 tablet (40 mg total) by mouth 2 (two) times daily.  60 tablet  1   No current facility-administered medications for this visit.    REVIEW OF SYSTEMS:   Constitutional: Denies fevers, chills or abnormal weight loss Eyes: Denies blurriness of vision Ears, nose, mouth, throat, and face: Denies mucositis or sore throat Respiratory: Denies cough, dyspnea or wheezes Cardiovascular: Denies palpitation, chest discomfort or lower extremity swelling Gastrointestinal:  Denies nausea, heartburn or change in bowel habits Skin: Denies abnormal skin rashes Lymphatics: Denies new lymphadenopathy or easy bruising Neurological:Denies numbness, tingling or new weaknesses Behavioral/Psych: Mood is stable, no new changes  All other systems were reviewed with the patient and are negative.  PHYSICAL EXAMINATION: ECOG PERFORMANCE STATUS:  1 - Symptomatic but completely ambulatory  Filed Vitals:   02/08/13 0913  BP: 152/49  Pulse: 60  Temp: 97.3 F (36.3 C)  Resp: 18   Filed Weights   02/08/13 0913  Weight: 105 lb 12.8 oz (47.991 kg)    GENERAL:alert, no distress and comfortable. She looks thin and mildly cachectic SKIN: skin color, texture, turgor are normal, no rashes or significant lesions EYES: normal, Conjunctiva are pink and non-injected, sclera clear OROPHARYNX:no exudate, no erythema and lips, buccal mucosa, and tongue normal  NECK: supple, thyroid normal size,  non-tender, without nodularity LYMPH:  no palpable lymphadenopathy in the cervical, axillary or inguinal LUNGS: clear to auscultation and percussion with normal breathing effort HEART: regular rate & rhythm and no murmurs and no lower extremity edema ABDOMEN:abdomen soft, non-tender and normal bowel sounds Musculoskeletal:no cyanosis of digits and no clubbing . Examination of her left knee did not reveal any evidence of joint effusion NEURO: alert & oriented x 3 with fluent speech, no focal motor/sensory deficits  LABORATORY DATA:  I have reviewed the data as listed    Component Value Date/Time   NA 142 12/14/2012 0923   NA 141 08/26/2011 0500   K 5.2* 12/14/2012 0923   K 4.1 08/26/2011 0500   CL 106 07/10/2012 0909   CL 106 08/26/2011 0500   CO2 25 12/14/2012 0923   CO2 22 08/26/2011 0500   GLUCOSE 96 12/14/2012 0923   GLUCOSE 102* 07/10/2012 0909   GLUCOSE 220* 08/26/2011 0500   BUN 41.9* 12/14/2012 0923   BUN 75* 08/26/2011 0500   CREATININE 2.3* 12/14/2012 0923   CREATININE 2.88* 08/26/2011 0500   CALCIUM 9.1 12/14/2012 0923   CALCIUM 8.0* 08/26/2011 0500   PROT 6.4 12/14/2012 0923   PROT 4.3* 08/21/2011 0439   ALBUMIN 3.3* 12/14/2012 0923   ALBUMIN 1.6* 08/25/2011 0526   AST 12 12/14/2012 0923   AST 120* 08/21/2011 0439   ALT <6 12/14/2012 0923   ALT 90* 08/21/2011 0439   ALKPHOS 77 12/14/2012 0923   ALKPHOS 64 08/21/2011 0439   BILITOT 0.43 12/14/2012 0923   BILITOT 0.4 08/21/2011 0439   GFRNONAA 15* 08/26/2011 0500   GFRAA 17* 08/26/2011 0500    No results found for this basename: SPEP,  UPEP,   kappa and lambda light chains    Lab Results  Component Value Date   WBC 5.7 02/08/2013   NEUTROABS 3.0 02/08/2013   HGB 9.7* 02/08/2013   HCT 29.4* 02/08/2013   MCV 89.7 02/08/2013   PLT 104* 02/08/2013      Chemistry      Component Value Date/Time   NA 142 12/14/2012 0923   NA 141 08/26/2011 0500   K 5.2* 12/14/2012 0923   K 4.1 08/26/2011 0500   CL 106 07/10/2012 0909   CL 106  08/26/2011 0500   CO2 25 12/14/2012 0923   CO2 22 08/26/2011 0500   BUN 41.9* 12/14/2012 0923   BUN 75* 08/26/2011 0500   CREATININE 2.3* 12/14/2012 0923   CREATININE 2.88* 08/26/2011 0500      Component Value Date/Time   CALCIUM 9.1 12/14/2012 0923   CALCIUM 8.0* 08/26/2011 0500   ALKPHOS 77 12/14/2012 0923   ALKPHOS 64 08/21/2011 0439   AST 12 12/14/2012 0923   AST 120* 08/21/2011 0439   ALT <6 12/14/2012 0923   ALT 90* 08/21/2011 0439   BILITOT 0.43 12/14/2012 0923   BILITOT 0.4 08/21/2011 0439       ASSESSMENT & PLAN:  #  1 B12 deficiency Her most recent B12 is adequately replaced. I recommend oral B12 supplements. She agreed. #2 anemia chronic disease Her hemoglobin is less than 10 g.. I recommend we proceed with erythropoietin stimulating agents today. My goal is to keep it above 10 g. #3 severe left knee pain I recommend referral to orthopedic surgeon for further evaluation. She agreed #4 chewing snuff The patient is not interested to quit. #5 anorexia She felt Remeron is not helpful. I recommend a trial of low-dose Megace. Orders Placed This Encounter  Procedures  . Ambulatory referral to Orthopedic Surgery    Referral Priority:  Routine    Referral Type:  Surgical    Referral Reason:  Specialty Services Required    Requested Specialty:  Orthopedic Surgery    Number of Visits Requested:  1   All questions were answered. The patient knows to call the clinic with any problems, questions or concerns. No barriers to learning was detected. I spent 25 minutes counseling the patient face to face. The total time spent in the appointment was 40 minutes and more than 50% was on counseling and review of test results     [facility], Berdella Bacot, MD 02/08/2013 9:35 AM

## 2013-02-08 NOTE — Telephone Encounter (Signed)
per 12/15 POF appt made w Dr [facility]  for 12/29 at 915 am [facility] on home ph GO will send pt new pt materials in mail shh

## 2013-02-14 ENCOUNTER — Other Ambulatory Visit: Payer: Self-pay | Admitting: Neurology

## 2013-02-19 ENCOUNTER — Other Ambulatory Visit: Payer: Medicare Other | Admitting: Lab

## 2013-02-19 ENCOUNTER — Ambulatory Visit: Payer: Medicare Other

## 2013-03-04 ENCOUNTER — Emergency Department (HOSPITAL_COMMUNITY): Payer: Medicare Other

## 2013-03-04 ENCOUNTER — Emergency Department (HOSPITAL_COMMUNITY)
Admission: EM | Admit: 2013-03-04 | Discharge: 2013-03-05 | Disposition: A | Discharge status: Home / Self Care | Payer: Medicare Other | Attending: Emergency Medicine | Admitting: Emergency Medicine

## 2013-03-04 ENCOUNTER — Encounter (HOSPITAL_COMMUNITY): Payer: Self-pay | Admitting: Emergency Medicine

## 2013-03-04 DIAGNOSIS — Y9301 Activity, walking, marching and hiking: Secondary | ICD-10-CM | POA: Insufficient documentation

## 2013-03-04 DIAGNOSIS — S79919A Unspecified injury of unspecified hip, initial encounter: Secondary | ICD-10-CM | POA: Insufficient documentation

## 2013-03-04 DIAGNOSIS — E119 Type 2 diabetes mellitus without complications: Secondary | ICD-10-CM | POA: Insufficient documentation

## 2013-03-04 DIAGNOSIS — M25552 Pain in left hip: Secondary | ICD-10-CM

## 2013-03-04 DIAGNOSIS — E538 Deficiency of other specified B group vitamins: Secondary | ICD-10-CM | POA: Insufficient documentation

## 2013-03-04 DIAGNOSIS — Y92009 Unspecified place in unspecified non-institutional (private) residence as the place of occurrence of the external cause: Secondary | ICD-10-CM | POA: Insufficient documentation

## 2013-03-04 DIAGNOSIS — S79929A Unspecified injury of unspecified thigh, initial encounter: Principal | ICD-10-CM

## 2013-03-04 DIAGNOSIS — Z79899 Other long term (current) drug therapy: Secondary | ICD-10-CM | POA: Insufficient documentation

## 2013-03-04 DIAGNOSIS — Z862 Personal history of diseases of the blood and blood-forming organs and certain disorders involving the immune mechanism: Secondary | ICD-10-CM | POA: Insufficient documentation

## 2013-03-04 DIAGNOSIS — Z8719 Personal history of other diseases of the digestive system: Secondary | ICD-10-CM | POA: Insufficient documentation

## 2013-03-04 DIAGNOSIS — M129 Arthropathy, unspecified: Secondary | ICD-10-CM | POA: Insufficient documentation

## 2013-03-04 DIAGNOSIS — F411 Generalized anxiety disorder: Secondary | ICD-10-CM | POA: Insufficient documentation

## 2013-03-04 DIAGNOSIS — N186 End stage renal disease: Secondary | ICD-10-CM | POA: Insufficient documentation

## 2013-03-04 DIAGNOSIS — W1809XA Striking against other object with subsequent fall, initial encounter: Secondary | ICD-10-CM | POA: Insufficient documentation

## 2013-03-04 DIAGNOSIS — R011 Cardiac murmur, unspecified: Secondary | ICD-10-CM | POA: Insufficient documentation

## 2013-03-04 DIAGNOSIS — I120 Hypertensive chronic kidney disease with stage 5 chronic kidney disease or end stage renal disease: Secondary | ICD-10-CM | POA: Insufficient documentation

## 2013-03-04 DIAGNOSIS — S0990XA Unspecified injury of head, initial encounter: Secondary | ICD-10-CM | POA: Insufficient documentation

## 2013-03-04 LAB — CBC WITH DIFFERENTIAL/PLATELET
Basophils Absolute: 0 10*3/uL (ref 0.0–0.1)
Basophils Relative: 0 % (ref 0–1)
Eosinophils Absolute: 0.1 10*3/uL (ref 0.0–0.7)
Eosinophils Relative: 2 % (ref 0–5)
HCT: 35.8 % — ABNORMAL LOW (ref 36.0–46.0)
Hemoglobin: 11.3 g/dL — ABNORMAL LOW (ref 12.0–15.0)
LYMPHS ABS: 1.8 10*3/uL (ref 0.7–4.0)
LYMPHS PCT: 21 % (ref 12–46)
MCH: 29.4 pg (ref 26.0–34.0)
MCHC: 31.6 g/dL (ref 30.0–36.0)
MCV: 93 fL (ref 78.0–100.0)
Monocytes Absolute: 0.9 10*3/uL (ref 0.1–1.0)
Monocytes Relative: 11 % (ref 3–12)
NEUTROS ABS: 5.9 10*3/uL (ref 1.7–7.7)
NEUTROS PCT: 67 % (ref 43–77)
Platelets: 92 10*3/uL — ABNORMAL LOW (ref 150–400)
RBC: 3.85 MIL/uL — AB (ref 3.87–5.11)
RDW: 15.8 % — ABNORMAL HIGH (ref 11.5–15.5)
WBC: 8.8 10*3/uL (ref 4.0–10.5)

## 2013-03-04 LAB — BASIC METABOLIC PANEL
BUN: 34 mg/dL — ABNORMAL HIGH (ref 6–23)
CO2: 14 meq/L — AB (ref 19–32)
Calcium: 8.9 mg/dL (ref 8.4–10.5)
Chloride: 106 mEq/L (ref 96–112)
Creatinine, Ser: 1.79 mg/dL — ABNORMAL HIGH (ref 0.50–1.10)
GFR calc Af Amer: 30 mL/min — ABNORMAL LOW (ref >90)
GFR, EST NON AFRICAN AMERICAN: 26 mL/min — AB (ref >90)
GLUCOSE: 105 mg/dL — AB (ref 70–99)
POTASSIUM: 5.6 meq/L — AB (ref 3.7–5.3)
SODIUM: 137 meq/L (ref 137–147)

## 2013-03-04 MED ORDER — LORAZEPAM 1 MG PO TABS
1.0000 mg | ORAL_TABLET | Freq: Once | ORAL | Status: AC
  Administered 2013-03-04: 1 mg via ORAL
  Filled 2013-03-04: qty 1

## 2013-03-04 MED ORDER — HALOPERIDOL LACTATE 5 MG/ML IJ SOLN: 5.0000 mg | Freq: Once | INTRAMUSCULAR | Status: DC

## 2013-03-04 MED ORDER — ACETAMINOPHEN 325 MG PO TABS
650.0000 mg | ORAL_TABLET | Freq: Once | ORAL | Status: AC
  Administered 2013-03-04: 650 mg via ORAL
  Filled 2013-03-04: qty 2

## 2013-03-04 NOTE — ED Notes (Signed)
The pt is scheduled to have a hip replacement on the rt hip next month

## 2013-03-04 NOTE — ED Notes (Signed)
The pt is c/o pain in the back of her head lower back and in her rt hip.  She fell yesterday and she fell backward.  No loc.  Pt alert c/o a headache

## 2013-03-04 NOTE — ED Notes (Signed)
Patient transported to MRI 

## 2013-03-04 NOTE — ED Notes (Addendum)
Pt. Returned from MRI. Real anxious. PA-C talked to pt. And will give ativan.

## 2013-03-04 NOTE — ED Provider Notes (Signed)
CSN: [id number]     Arrival date & time 03/04/13  1620 History   First MD Initiated Contact with Patient 03/04/13 1900     Chief Complaint  Patient presents with  . Fall   (Consider location/radiation/quality/duration/timing/severity/associated sxs/prior Treatment) Patient is a 80 y.o. female presenting with fall. The history is provided by the patient.  Fall This is a new problem. The current episode started yesterday. Episode frequency: once. The problem has been resolved. Pertinent negatives include no chest pain, no abdominal pain, no headaches and no shortness of breath. Nothing aggravates the symptoms. Nothing relieves the symptoms. Treatments tried: tramadol. The treatment provided mild relief.    Past Medical History  Diagnosis Date  . Hypertension   . Anxiety and depression   . Chronic headaches   . Diverticular disease   . Anemia   . Arthritis     osteoarthritis  . Osteoporosis   . Vitamin B 12 deficiency   . History of ITP   . Anxiety     takes xanax for sleep  . Diabetes mellitus     NIDDM x 12 years  . Heart murmur     benign;asymptomatic  . Chronic kidney disease     esrd  . Left knee pain 02/08/2013  . Cachexia 02/08/2013   Past Surgical History  Procedure Laterality Date  . Cholecystectomy    . Av fistula placement  11/15/10    left upper arm AVF  . Abdominal hysterectomy  1973  . Joint replacement  2007    rt hip  . Eye surgery    . Cataract extraction w/ intraocular lens  implant, bilateral  2002  . Vascular surgery  10-2010    left AVF  . Hernia repair  2000    ventral  . Ventral hernia repair  08/06/2011    Procedure: HERNIA REPAIR VENTRAL ADULT;  Surgeon: Liz Malady, MD;  Location: [facility] OR;  Service: General;  Laterality: N/A;  Open Repair REcurrent Inc. Hernia with Mesh   Family History  Problem Relation Age of Onset  . Stroke Mother   . Heart disease Father    History  Substance Use Topics  . Smoking status: Never Smoker   .  Smokeless tobacco: Current User    Types: Snuff  . Alcohol Use: No   OB History   Grav Para Term Preterm Abortions TAB SAB Ect Mult Living                 Review of Systems  Constitutional: Negative for fever and fatigue.  HENT: Negative for congestion and drooling.   Eyes: Negative for pain.  Respiratory: Negative for cough and shortness of breath.   Cardiovascular: Negative for chest pain.  Gastrointestinal: Negative for nausea, vomiting, abdominal pain and diarrhea.  Genitourinary: Negative for dysuria and hematuria.  Musculoskeletal: Negative for back pain, gait problem and neck pain.  Skin: Negative for color change.  Neurological: Negative for dizziness and headaches.  Hematological: Negative for adenopathy.  Psychiatric/Behavioral: Negative for behavioral problems.  All other systems reviewed and are negative.    Allergies  Review of patient's allergies indicates no known allergies.  Home Medications   Current Outpatient Rx  Name  Route  Sig  Dispense  Refill  . acetaminophen (TYLENOL) 500 MG tablet   Oral   Take 500 mg by mouth 2 (two) times daily as needed. For pain.         Marland Kitchen antiseptic oral rinse (BIOTENE) LIQD   Mouth  Rinse   15 mLs by Mouth Rinse route QID.         Marland Kitchen. atenolol (TENORMIN) 25 MG tablet   Oral   Take 25 mg by mouth 2 (two) times daily.          Marland Kitchen. atorvastatin (LIPITOR) 20 MG tablet   Oral   Take 20 mg by mouth. daily         . cloNIDine (CATAPRES) 0.1 MG tablet   Oral   Take 0.1 mg by mouth 2 (two) times daily.           . darbepoetin (ARANESP) 150 MCG/0.3ML SOLN   Subcutaneous   Inject 150 mcg into the skin every 30 (thirty) days. As needed. Only receives if blood test is low         . furosemide (LASIX) 20 MG tablet   Oral   Take 20 mg by mouth daily.          Marland Kitchen. levETIRAcetam (KEPPRA) 250 MG tablet      TAKE 1 TABLET BY MOUTH TWICE A DAY   60 tablet   0     PLEASE SCHEDULE APPT   . megestrol (MEGACE) 40 MG  tablet   Oral   Take 1 tablet (40 mg total) by mouth 2 (two) times daily.   60 tablet   1   . metoprolol tartrate (LOPRESSOR) 25 mg/10 mL SUSP   Per Tube   Place 5 mLs (12.5 mg total) into feeding tube 2 (two) times daily.         Marland Kitchen. oxybutynin (DITROPAN) 5 MG tablet   Oral   Take 5 mg by mouth daily.          . vitamin B-12 (CYANOCOBALAMIN) 1000 MCG tablet   Oral   Take 1 tablet (1,000 mcg total) by mouth daily.   30 tablet   6    BP 191/112  Pulse 97  Temp(Src) 98.1 F (36.7 C) (Oral)  Resp 16  SpO2 97% Physical Exam  Nursing note and vitals reviewed. Constitutional: [name] oriented to person, place, and time. She appears well-developed and well-nourished.  HENT:  Head: Normocephalic.  Mouth/Throat: Oropharynx is clear and moist. No oropharyngeal exudate.  Eyes: Conjunctivae and EOM are normal. Pupils are equal, round, and reactive to light.  Neck: Normal range of motion. Neck supple.  No focal vertebral tenderness to palpation.  Cardiovascular: Normal rate, regular rhythm, normal heart sounds and intact distal pulses.  Exam reveals no gallop and no friction rub.   No murmur heard. Pulmonary/Chest: Effort normal and breath sounds normal. No respiratory distress. She has no wheezes.  Abdominal: Soft. Bowel sounds are normal. There is no tenderness. There is no rebound and no guarding.  Musculoskeletal: Normal range of motion. She exhibits no edema and no tenderness.  Normal range of motion of hips bilaterally. No focal head pain with palpation.  Normal strength in all extremities. 2+ distal pulses.  Mild skin tear to distal left anterior shin.  Moderate size skin tear to left lateral upper arm.  Neurological: [name] alert and oriented to person, place, and time. She has normal strength. No cranial nerve deficit or sensory deficit. Coordination normal.  Skin: Skin is warm and dry.  Psychiatric: She has a normal mood and affect. Her behavior is normal.    ED  Course  Procedures (including critical care time) Labs Review Labs Reviewed  CBC WITH DIFFERENTIAL - Abnormal; Notable for the following:    RBC  3.85 (*)    Hemoglobin 11.3 (*)    HCT 35.8 (*)    RDW 15.8 (*)    Platelets 92 (*)    All other components within normal limits  BASIC METABOLIC PANEL - Abnormal; Notable for the following:    Potassium 5.6 (*)    CO2 14 (*)    Glucose, Bld 105 (*)    BUN 34 (*)    Creatinine, Ser 1.79 (*)    GFR calc non Af Amer 26 (*)    GFR calc Af Amer 30 (*)    All other components within normal limits   Imaging Review Dg Chest 2 View  03/04/2013   CLINICAL DATA:  History of bronchitis and pneumonia. Status post fall.  EXAM: CHEST  2 VIEW  COMPARISON:  DG CHEST 1V PORT dated 08/26/2011; DG CHEST 1V PORT dated 08/26/2011; DG CHEST 1V PORT dated 08/25/2011; DG CHEST 1V PORT dated 08/23/2011  FINDINGS: No focal consolidation, pleural effusion or pneumothorax. Stable cardiomegaly. Unremarkable osseous structures.  IMPRESSION: No active cardiopulmonary disease.   Electronically Signed   By: Elige Ko   On: 03/04/2013 20:16   Dg Hip Complete Left  03/04/2013   CLINICAL DATA:  Left hip pain  EXAM: LEFT HIP - COMPLETE 2+ VIEW  COMPARISON:  DG ABD 2 VIEWS dated 08/19/2011; DG ABD 2 VIEWS dated 08/11/2011; DG ABD 2 VIEWS dated 08/10/2011; CT ABD/PELV WO CM dated 08/04/2011  FINDINGS: There is a sliver of bone along the medial aspect of the left femoral neck concerning for a nondisplaced fracture. There is severe left superior joint space narrowing with a bone-on-bone appearance and subchondral sclerosis. There is a right total hip arthroplasty.  There is generalized osteopenia.  IMPRESSION: Sliver of bone along the medial aspect of the left femoral neck concerning for a nondisplaced fracture.  Severe superior left hip joint space narrowing.   Electronically Signed   By: Elige Ko   On: 03/04/2013 20:08   Ct Head Wo Contrast  03/04/2013   CLINICAL DATA:  Pain in the back of  the head.  EXAM: CT HEAD WITHOUT CONTRAST  CT CERVICAL SPINE WITHOUT CONTRAST  TECHNIQUE: Multidetector CT imaging of the head and cervical spine was performed following the standard protocol without intravenous contrast. Multiplanar CT image reconstructions of the cervical spine were also generated.  COMPARISON:  Head CT 08/12/2011  FINDINGS: CT HEAD FINDINGS  Again noted is low density throughout the subcortical and periventricular white matter. No evidence for acute hemorrhage, mass lesion, midline shift, hydrocephalus or large infarct. No acute bone abnormality. There is frothy material within the sphenoid sinus. Otherwise, the visualized sinuses are clear.  CT CERVICAL SPINE FINDINGS  Lung apices are clear. Negative for an acute fracture. Multilevel degenerative changes. No significant soft tissue swelling. The carotid arteries are heavily calcified. Mild kyphosis of the cervical spine. There is disc space narrowing at C5-C6 and C6-C7. There is ankylosis along the right facets of C4 and C5.  IMPRESSION: No acute intracranial abnormality.  Diffuse low density in the white matter suggests chronic small vessel ischemic changes.  Multilevel cervical spondylosis. No acute bone abnormality in the cervical spine.  Heavily calcified carotid arteries.   Electronically Signed   By: Richarda Overlie M.D.   On: 03/04/2013 20:08   Ct Cervical Spine Wo Contrast  03/04/2013   CLINICAL DATA:  Pain in the back of the head.  EXAM: CT HEAD WITHOUT CONTRAST  CT CERVICAL SPINE WITHOUT CONTRAST  TECHNIQUE:  Multidetector CT imaging of the head and cervical spine was performed following the standard protocol without intravenous contrast. Multiplanar CT image reconstructions of the cervical spine were also generated.  COMPARISON:  Head CT 08/12/2011  FINDINGS: CT HEAD FINDINGS  Again noted is low density throughout the subcortical and periventricular white matter. No evidence for acute hemorrhage, mass lesion, midline shift, hydrocephalus  or large infarct. No acute bone abnormality. There is frothy material within the sphenoid sinus. Otherwise, the visualized sinuses are clear.  CT CERVICAL SPINE FINDINGS  Lung apices are clear. Negative for an acute fracture. Multilevel degenerative changes. No significant soft tissue swelling. The carotid arteries are heavily calcified. Mild kyphosis of the cervical spine. There is disc space narrowing at C5-C6 and C6-C7. There is ankylosis along the right facets of C4 and C5.  IMPRESSION: No acute intracranial abnormality.  Diffuse low density in the white matter suggests chronic small vessel ischemic changes.  Multilevel cervical spondylosis. No acute bone abnormality in the cervical spine.  Heavily calcified carotid arteries.   Electronically Signed   By: Richarda Overlie M.D.   On: 03/04/2013 20:08   Mr Hip Left Wo Contrast  03/05/2013   ADDENDUM REPORT: 03/05/2013 08:18  ADDENDUM: There are partial tears of bilateral hamstring origins.   Electronically Signed   By: Elige Ko   On: 03/05/2013 08:18   03/05/2013   CLINICAL DATA:  Left hip pain  EXAM: MRI OF THE LEFT HIP WITHOUT CONTRAST  TECHNIQUE: Multiplanar, multisequence MR imaging was performed. No intravenous contrast was administered.  COMPARISON:  None.  FINDINGS: The examination is limited secondary to patient discomfort. The patient's mental status change during the examination. The examination was aborted at that time. Coronal T1 and ST IR images were obtained. There is also significant patient motion on the obtained images degrading image quality which also limits evaluation.  There is a right hip arthroplasty. There is no left hip fracture or dislocation. The abnormality seen on the plain films was likely artifactual. There is a small left joint effusion. There is superior severe joint space narrowing of the left hip with subchondral reactive marrow edema in the left femoral head.  There are bilateral sacral insufficiency fractures.  IMPRESSION: The  examination is limited secondary to patient discomfort. The patient's mental status change during the examination. The examination was aborted at that time. Coronal T1 and ST IR images were obtained. There is also significant patient motion on the obtained images degrading image quality which also limits evaluation.  1. No definite left hip fracture. 2. Severe superior joint space narrowing of the left hip with subchondral marrow edema in the left femoral head. 3. Bilateral sacral insufficiency fractures.  Electronically Signed: By: Elige Ko On: 03/04/2013 22:10    EKG Interpretation    Date/Time:  Thursday March 04 2013 20:52:16 EST Ventricular Rate:  70 PR Interval:  167 QRS Duration: 93 QT Interval:  395 QTC Calculation: 426 R Axis:   -24 Text Interpretation:  Sinus rhythm Abnormal R-wave progression, early transition Left ventricular hypertrophy No significant change since last tracing Confirmed by Jolean Madariaga  MD, Elias Dennington ([id number]) on 03/05/2013 10:26:34 AM            MDM   1. Hip pain, acute, left    7:27 PM 80 y.o. female presents with a mechanical fall which occurred yesterday in her living room as she was walking. She states that her foot hit an object and she fell backwards on her back and  hitting her head. She denies any loss of consciousness. She has had a mild headache since then and left hip pain. [name] afebrile and hypertensive here. This time take her evening medications. I observed her daughter administering her 50 mg of Keppra, 50 mg tramadol, 0.1 mg clonidine, 50 mg atenolol from the patient's personal prescriptions. Will give a Tylenol for pain and get screening imaging.  Plain film suspicious for fx of left hip. Discussed w/ on call ortho ([facility]). Will get MRI. Will go ahead and get screening labs/ecg d/t possible admission.   Labs showing known renal insuff which is improved from previous w/ mildly elevated K which she has had many times previously. ECG is  unchanged w/out peaked t waves or QRS widening. Transferred care to [facility] to await MRI.     Junius Argyle, MD 03/05/13 [phone]

## 2013-03-05 NOTE — ED Provider Notes (Signed)
2000 - Patient care assumed from Dr. Purvis SheffieldForrest Harrison at shift change. Patient presents for L hip pain with hx of fall yesterday. Xray imaging unclear as to whether hip fracture present. Dr. Romeo AppleHarrison consulted with Dr. Thomasena Edisollins, on call for orthopedics. He recommends MR imaging of hip. MR pending.  2300 - Patient returned from MR anxious. 1mg  PO ativan given with relief of anxiety. MR imaging limited secondary to patient's movement and discomfort while study being completed. Have spoken with Dr. Thomasena Edisollins regarding imaging results and that definitive hip fracture not identified. Plan discussed with Dr. Thomasena Edisollins; will plan to admit if unable to ambulate and d/c with outpatient orthopedic follow up if patient able to ambulate in ED.  0010 - After Ativan effects worn off, patient able to ambulate in ED. Daughter states that patient ambulating at baseline; usually walks at home with cane or walker. I asked patient how she felt when she was ambulating to which she responded, "like a dream". Given patient's ability to ambulate at baseline and lack of definitive hip fracture, patient stable for d/c with orthopedic follow up. [name] scheduled to have a L hip replacement done by Dr. Barry Dieneswens. Return precautions discussed and patient and family agreeable to plan with no unaddressed concerns.   Results for orders placed during the hospital encounter of 03/04/13  CBC WITH DIFFERENTIAL      Result Value Range   WBC 8.8  4.0 - 10.5 K/uL   RBC 3.85 (*) 3.87 - 5.11 MIL/uL   Hemoglobin 11.3 (*) 12.0 - 15.0 g/dL   HCT 16.135.8 (*) 09.636.0 - 04.546.0 %   MCV 93.0  78.0 - 100.0 fL   MCH 29.4  26.0 - 34.0 pg   MCHC 31.6  30.0 - 36.0 g/dL   RDW 40.915.8 (*) 81.111.5 - 91.415.5 %   Platelets 92 (*) 150 - 400 K/uL   Neutrophils Relative % 67  43 - 77 %   Neutro Abs 5.9  1.7 - 7.7 K/uL   Lymphocytes Relative 21  12 - 46 %   Lymphs Abs 1.8  0.7 - 4.0 K/uL   Monocytes Relative 11  3 - 12 %   Monocytes Absolute 0.9  0.1 - 1.0 K/uL   Eosinophils  Relative 2  0 - 5 %   Eosinophils Absolute 0.1  0.0 - 0.7 K/uL   Basophils Relative 0  0 - 1 %   Basophils Absolute 0.0  0.0 - 0.1 K/uL  BASIC METABOLIC PANEL      Result Value Range   Sodium 137  137 - 147 mEq/L   Potassium 5.6 (*) 3.7 - 5.3 mEq/L   Chloride 106  96 - 112 mEq/L   CO2 14 (*) 19 - 32 mEq/L   Glucose, Bld 105 (*) 70 - 99 mg/dL   BUN 34 (*) 6 - 23 mg/dL   Creatinine, Ser 7.821.79 (*) 0.50 - 1.10 mg/dL   Calcium 8.9  8.4 - 95.610.5 mg/dL   GFR calc non Af Amer 26 (*) >90 mL/min   GFR calc Af Amer 30 (*) >90 mL/min   Dg Chest 2 View  03/04/2013   CLINICAL DATA:  History of bronchitis and pneumonia. Status post fall.  EXAM: CHEST  2 VIEW  COMPARISON:  DG CHEST 1V PORT dated 08/26/2011; DG CHEST 1V PORT dated 08/26/2011; DG CHEST 1V PORT dated 08/25/2011; DG CHEST 1V PORT dated 08/23/2011  FINDINGS: No focal consolidation, pleural effusion or pneumothorax. Stable cardiomegaly. Unremarkable osseous structures.  IMPRESSION: No  active cardiopulmonary disease.   Electronically Signed   By: Elige Ko   On: 03/04/2013 20:16   Dg Hip Complete Left  03/04/2013   CLINICAL DATA:  Left hip pain  EXAM: LEFT HIP - COMPLETE 2+ VIEW  COMPARISON:  DG ABD 2 VIEWS dated 08/19/2011; DG ABD 2 VIEWS dated 08/11/2011; DG ABD 2 VIEWS dated 08/10/2011; CT ABD/PELV WO CM dated 08/04/2011  FINDINGS: There is a sliver of bone along the medial aspect of the left femoral neck concerning for a nondisplaced fracture. There is severe left superior joint space narrowing with a bone-on-bone appearance and subchondral sclerosis. There is a right total hip arthroplasty.  There is generalized osteopenia.  IMPRESSION: Sliver of bone along the medial aspect of the left femoral neck concerning for a nondisplaced fracture.  Severe superior left hip joint space narrowing.   Electronically Signed   By: Elige Ko   On: 03/04/2013 20:08   Ct Head Wo Contrast  03/04/2013   CLINICAL DATA:  Pain in the back of the head.  EXAM: CT HEAD WITHOUT  CONTRAST  CT CERVICAL SPINE WITHOUT CONTRAST  TECHNIQUE: Multidetector CT imaging of the head and cervical spine was performed following the standard protocol without intravenous contrast. Multiplanar CT image reconstructions of the cervical spine were also generated.  COMPARISON:  Head CT 08/12/2011  FINDINGS: CT HEAD FINDINGS  Again noted is low density throughout the subcortical and periventricular white matter. No evidence for acute hemorrhage, mass lesion, midline shift, hydrocephalus or large infarct. No acute bone abnormality. There is frothy material within the sphenoid sinus. Otherwise, the visualized sinuses are clear.  CT CERVICAL SPINE FINDINGS  Lung apices are clear. Negative for an acute fracture. Multilevel degenerative changes. No significant soft tissue swelling. The carotid arteries are heavily calcified. Mild kyphosis of the cervical spine. There is disc space narrowing at C5-C6 and C6-C7. There is ankylosis along the right facets of C4 and C5.  IMPRESSION: No acute intracranial abnormality.  Diffuse low density in the white matter suggests chronic small vessel ischemic changes.  Multilevel cervical spondylosis. No acute bone abnormality in the cervical spine.  Heavily calcified carotid arteries.   Electronically Signed   By: Richarda Overlie M.D.   On: 03/04/2013 20:08   Ct Cervical Spine Wo Contrast  03/04/2013   CLINICAL DATA:  Pain in the back of the head.  EXAM: CT HEAD WITHOUT CONTRAST  CT CERVICAL SPINE WITHOUT CONTRAST  TECHNIQUE: Multidetector CT imaging of the head and cervical spine was performed following the standard protocol without intravenous contrast. Multiplanar CT image reconstructions of the cervical spine were also generated.  COMPARISON:  Head CT 08/12/2011  FINDINGS: CT HEAD FINDINGS  Again noted is low density throughout the subcortical and periventricular white matter. No evidence for acute hemorrhage, mass lesion, midline shift, hydrocephalus or large infarct. No acute bone  abnormality. There is frothy material within the sphenoid sinus. Otherwise, the visualized sinuses are clear.  CT CERVICAL SPINE FINDINGS  Lung apices are clear. Negative for an acute fracture. Multilevel degenerative changes. No significant soft tissue swelling. The carotid arteries are heavily calcified. Mild kyphosis of the cervical spine. There is disc space narrowing at C5-C6 and C6-C7. There is ankylosis along the right facets of C4 and C5.  IMPRESSION: No acute intracranial abnormality.  Diffuse low density in the white matter suggests chronic small vessel ischemic changes.  Multilevel cervical spondylosis. No acute bone abnormality in the cervical spine.  Heavily calcified carotid arteries.  Electronically Signed   By: Richarda Overlie M.D.   On: 03/04/2013 20:08   Mr Hip Left Wo Contrast  03/04/2013   CLINICAL DATA:  Left hip pain  EXAM: MRI OF THE LEFT HIP WITHOUT CONTRAST  TECHNIQUE: Multiplanar, multisequence MR imaging was performed. No intravenous contrast was administered.  COMPARISON:  None.  FINDINGS: The examination is limited secondary to patient discomfort. The patient's mental status change during the examination. The examination was aborted at that time. Coronal T1 and ST IR images were obtained. There is also significant patient motion on the obtained images degrading image quality which also limits evaluation.  There is a right hip arthroplasty. There is no left hip fracture or dislocation. The abnormality seen on the plain films was likely artifactual. There is a small left joint effusion. There is superior severe joint space narrowing of the left hip with subchondral reactive marrow edema in the left femoral head.  There are bilateral sacral insufficiency fractures.  IMPRESSION: The examination is limited secondary to patient discomfort. The patient's mental status change during the examination. The examination was aborted at that time. Coronal T1 and ST IR images were obtained. There is also  significant patient motion on the obtained images degrading image quality which also limits evaluation.  1. No definite left hip fracture. 2. Severe superior joint space narrowing of the left hip with subchondral marrow edema in the left femoral head. 3. Bilateral sacral insufficiency fractures.   Electronically Signed   By: Elige Ko   On: 03/04/2013 22:10      Antony Madura, PA-C 03/05/13 1610

## 2013-03-05 NOTE — ED Provider Notes (Signed)
Medical screening examination/treatment/procedure(s) were performed by non-physician practitioner and as supervising physician I was immediately available for consultation/collaboration.   Dione Boozeavid Millianna Szymborski, MD 03/05/13 [phone]

## 2013-03-05 NOTE — Discharge Instructions (Signed)
Use a walker when walking in your home. Follow up with your orthopedist as soon as possible, preferably tomorrow 03/05/13. Return if pain worsens.  Arthralgia Your caregiver has diagnosed you as suffering from an arthralgia. Arthralgia means there is pain in a joint. This can come from many reasons including:  Bruising the joint which causes soreness (inflammation) in the joint.  Wear and tear on the joints which occur as we grow older (osteoarthritis).  Overusing the joint.  Various forms of arthritis.  Infections of the joint. Regardless of the cause of pain in your joint, most of these different pains respond to anti-inflammatory drugs and rest. The exception to this is when a joint is infected, and these cases are treated with antibiotics, if it is a bacterial infection. HOME CARE INSTRUCTIONS   Rest the injured area for as long as directed by your caregiver. Then slowly start using the joint as directed by your caregiver and as the pain allows. Crutches as directed may be useful if the ankles, knees or hips are involved. If the knee was splinted or casted, continue use and care as directed. If an stretchy or elastic wrapping bandage has been applied today, it should be removed and re-applied every 3 to 4 hours. It should not be applied tightly, but firmly enough to keep swelling down. Watch toes and feet for swelling, bluish discoloration, coldness, numbness or excessive pain. If any of these problems (symptoms) occur, remove the ace bandage and re-apply more loosely. If these symptoms persist, contact your caregiver or return to this location.  For the first 24 hours, keep the injured extremity elevated on pillows while lying down.  Apply ice for 15-20 minutes to the sore joint every couple hours while awake for the first half day. Then 03-04 times per day for the first 48 hours. Put the ice in a plastic bag and place a towel between the bag of ice and your skin.  Wear any splinting,  casting, elastic bandage applications, or slings as instructed.  Only take over-the-counter or prescription medicines for pain, discomfort, or fever as directed by your caregiver. Do not use aspirin immediately after the injury unless instructed by your physician. Aspirin can cause increased bleeding and bruising of the tissues.  If you were given crutches, continue to use them as instructed and do not resume weight bearing on the sore joint until instructed. Persistent pain and inability to use the sore joint as directed for more than 2 to 3 days are warning signs indicating that you should see a caregiver for a follow-up visit as soon as possible. Initially, a hairline fracture (break in bone) may not be evident on X-rays. Persistent pain and swelling indicate that further evaluation, non-weight bearing or use of the joint (use of crutches or slings as instructed), or further X-rays are indicated. X-rays may sometimes not show a small fracture until a week or 10 days later. Make a follow-up appointment with your own caregiver or one to whom we have referred you. A radiologist (specialist in reading X-rays) may read your X-rays. Make sure you know how you are to obtain your X-ray results. Do not assume everything is normal if you do not hear from [place]. SEEK MEDICAL CARE IF: Bruising, swelling, or pain increases. SEEK IMMEDIATE MEDICAL CARE IF:   Your fingers or toes are numb or blue.  The pain is not responding to medications and continues to stay the same or get worse.  The pain in your joint becomes  severe.  You develop a fever over 102 F (38.9 C).  It becomes impossible to move or use the joint. MAKE SURE YOU:   Understand these instructions.  Will watch your condition.  Will get help right away if you are not doing well or get worse. Document Released: 02/11/2005 Document Revised: 05/06/2011 Document Reviewed: 09/30/2007 [facility] Patient Information 2014 [employer] [place].

## 2013-03-16 ENCOUNTER — Other Ambulatory Visit: Payer: Self-pay | Admitting: Neurology

## 2013-03-22 ENCOUNTER — Other Ambulatory Visit (HOSPITAL_BASED_OUTPATIENT_CLINIC_OR_DEPARTMENT_OTHER): Payer: Medicare Other

## 2013-03-22 ENCOUNTER — Telehealth: Payer: Self-pay | Admitting: Hematology and Oncology

## 2013-03-22 ENCOUNTER — Encounter: Payer: Self-pay | Admitting: Hematology and Oncology

## 2013-03-22 ENCOUNTER — Ambulatory Visit (HOSPITAL_BASED_OUTPATIENT_CLINIC_OR_DEPARTMENT_OTHER): Payer: Medicare Other | Admitting: Hematology and Oncology

## 2013-03-22 ENCOUNTER — Ambulatory Visit: Payer: Medicare Other

## 2013-03-22 VITALS — BP 129/69 | HR 65 | Temp 97.4°F | Resp 20 | Ht 63.0 in

## 2013-03-22 DIAGNOSIS — D638 Anemia in other chronic diseases classified elsewhere: Secondary | ICD-10-CM

## 2013-03-22 DIAGNOSIS — D518 Other vitamin B12 deficiency anemias: Secondary | ICD-10-CM

## 2013-03-22 DIAGNOSIS — D519 Vitamin B12 deficiency anemia, unspecified: Secondary | ICD-10-CM

## 2013-03-22 DIAGNOSIS — M25569 Pain in unspecified knee: Secondary | ICD-10-CM

## 2013-03-22 DIAGNOSIS — D631 Anemia in chronic kidney disease: Secondary | ICD-10-CM

## 2013-03-22 DIAGNOSIS — F17200 Nicotine dependence, unspecified, uncomplicated: Secondary | ICD-10-CM

## 2013-03-22 DIAGNOSIS — N189 Chronic kidney disease, unspecified: Secondary | ICD-10-CM

## 2013-03-22 LAB — CBC WITH DIFFERENTIAL/PLATELET
BASO%: 1.1 % (ref 0.0–2.0)
BASOS ABS: 0.1 10*3/uL (ref 0.0–0.1)
EOS ABS: 0.2 10*3/uL (ref 0.0–0.5)
EOS%: 3.1 % (ref 0.0–7.0)
HCT: 33.7 % — ABNORMAL LOW (ref 34.8–46.6)
HGB: 11 g/dL — ABNORMAL LOW (ref 11.6–15.9)
LYMPH%: 23.8 % (ref 14.0–49.7)
MCH: 29.7 pg (ref 25.1–34.0)
MCHC: 32.7 g/dL (ref 31.5–36.0)
MCV: 90.9 fL (ref 79.5–101.0)
MONO#: 0.5 10*3/uL (ref 0.1–0.9)
MONO%: 7.4 % (ref 0.0–14.0)
NEUT%: 64.6 % (ref 38.4–76.8)
NEUTROS ABS: 4.3 10*3/uL (ref 1.5–6.5)
PLATELETS: 206 10*3/uL (ref 145–400)
RBC: 3.71 10*6/uL (ref 3.70–5.45)
RDW: 15.3 % — AB (ref 11.2–14.5)
WBC: 6.7 10*3/uL (ref 3.9–10.3)
lymph#: 1.6 10*3/uL (ref 0.9–3.3)

## 2013-03-22 LAB — COMPREHENSIVE METABOLIC PANEL (CC13)
ALBUMIN: 3.1 g/dL — AB (ref 3.5–5.0)
ALT: 8 U/L (ref 0–55)
ANION GAP: 9 meq/L (ref 3–11)
AST: 12 U/L (ref 5–34)
Alkaline Phosphatase: 132 U/L (ref 40–150)
BUN: 54.4 mg/dL — ABNORMAL HIGH (ref 7.0–26.0)
CALCIUM: 9.1 mg/dL (ref 8.4–10.4)
CO2: 20 meq/L — AB (ref 22–29)
Chloride: 109 mEq/L (ref 98–109)
Creatinine: 2 mg/dL — ABNORMAL HIGH (ref 0.6–1.1)
GLUCOSE: 99 mg/dL (ref 70–140)
POTASSIUM: 5.4 meq/L — AB (ref 3.5–5.1)
SODIUM: 137 meq/L (ref 136–145)
TOTAL PROTEIN: 6.2 g/dL — AB (ref 6.4–8.3)
Total Bilirubin: 0.61 mg/dL (ref 0.20–1.20)

## 2013-03-22 MED ORDER — DARBEPOETIN ALFA-POLYSORBATE 500 MCG/ML IJ SOLN: 300.0000 ug | Freq: Once | INTRAMUSCULAR | Status: DC

## 2013-03-22 NOTE — Progress Notes (Signed)
[facility] OFFICE PROGRESS NOTE  BURNETT,[name], MD DIAGNOSIS:  Anemia chronic disease and history of B12 deficiency  SUMMARY OF HEMATOLOGIC HISTORY: This is a pleasant 80 year old lady was found to have anemia. Previously she was found to have B12 deficiency and had been receiving B12 injection monthly. For the anemia chronic disease she was getting Aranesp. She was in a mild thrombocytopenia which resolved with low-dose prednisone. INTERVAL HISTORY: [name] 80 y.o. female returns for further followup. I refer her to see an orthopedic surgeon and MRI showed pelvic insufficiency fracture and partial tears of bilateral hamstring origins. [name] currently undergoing workup for orthopedic surgery. From the anemia standpoint, she has been compliant with B12 supplements She denies any recent fever, chills, night sweats or abnormal weight loss Her appetite has improved.  I have reviewed the past medical history, past surgical history, social history and family history with the patient and they are unchanged from previous note.  ALLERGIES:  has No Known Allergies.  MEDICATIONS:  Current Outpatient Prescriptions  Medication Sig Dispense Refill  . acetaminophen (TYLENOL) 500 MG tablet Take 500 mg by mouth 2 (two) times daily as needed. For pain.      Marland Kitchen atenolol (TENORMIN) 25 MG tablet Take 25 mg by mouth 2 (two) times daily.       Marland Kitchen atorvastatin (LIPITOR) 20 MG tablet Take 20 mg by mouth every evening. daily      . Cholecalciferol (VITAMIN D-3) 1000 UNITS CAPS Take 1,000 Units by mouth daily.      . cloNIDine (CATAPRES) 0.1 MG tablet Take 0.1 mg by mouth 2 (two) times daily.       . darbepoetin (ARANESP) 150 MCG/0.3ML SOLN Inject 150 mcg into the skin every 30 (thirty) days. As needed. Only receives if blood test is low      . furosemide (LASIX) 20 MG tablet Take 20 mg by mouth daily.       Marland Kitchen levETIRAcetam (KEPPRA) 250 MG tablet TAKE 1 TABLET BY MOUTH TWICE A DAY  30 tablet   0  . megestrol (MEGACE) 40 MG tablet Take 1 tablet (40 mg total) by mouth 2 (two) times daily.  60 tablet  1  . oxybutynin (DITROPAN) 5 MG tablet Take 5 mg by mouth daily.       . traMADol (ULTRAM) 50 MG tablet Take 50 mg by mouth every 6 (six) hours as needed for moderate pain.      . vitamin B-12 (CYANOCOBALAMIN) 1000 MCG tablet Take 1 tablet (1,000 mcg total) by mouth daily.  30 tablet  6   No current facility-administered medications for this visit.     REVIEW OF SYSTEMS:   Constitutional: Denies fevers, chills or night sweats Behavioral/Psych: Mood is stable, no new changes  All other systems were reviewed with the patient and are negative.  PHYSICAL EXAMINATION: ECOG PERFORMANCE STATUS: 1 - Symptomatic but completely ambulatory  Filed Vitals:   03/22/13 1035  BP: 129/69  Pulse: 65  Temp: 97.4 F (36.3 C)  Resp: 20   Filed Weights    GENERAL:alert, no distress and comfortable. She looks thin and cachectic  SKIN: skin color, texture, turgor are normal, no rashes or significant lesions  Musculoskeletal:no cyanosis of digits and no clubbing  NEURO: alert & oriented x 3 with fluent speech, no focal motor/sensory deficits  LABORATORY DATA:  I have reviewed the data as listed Results for orders placed in visit on 03/22/13 (from the past 48 hour(s))  CBC WITH DIFFERENTIAL     Status: Abnormal   Collection Time    03/22/13 10:20 AM      Result Value Range   WBC 6.7  3.9 - 10.3 10e3/uL   NEUT# 4.3  1.5 - 6.5 10e3/uL   HGB 11.0 (*) 11.6 - 15.9 g/dL   HCT 16.133.7 (*) 09.634.8 - 04.546.6 %   Platelets 206  145 - 400 10e3/uL   MCV 90.9  79.5 - 101.0 fL   MCH 29.7  25.1 - 34.0 pg   MCHC 32.7  31.5 - 36.0 g/dL   RBC 4.093.71  8.113.70 - 9.145.45 10e6/uL   RDW 15.3 (*) 11.2 - 14.5 %   lymph# 1.6  0.9 - 3.3 10e3/uL   MONO# 0.5  0.1 - 0.9 10e3/uL   Eosinophils Absolute 0.2  0.0 - 0.5 10e3/uL   Basophils Absolute 0.1  0.0 - 0.1 10e3/uL   NEUT% 64.6  38.4 - 76.8 %   LYMPH% 23.8  14.0 - 49.7 %    MONO% 7.4  0.0 - 14.0 %   EOS% 3.1  0.0 - 7.0 %   BASO% 1.1  0.0 - 2.0 %    Lab Results  Component Value Date   WBC 6.7 03/22/2013   HGB 11.0* 03/22/2013   HCT 33.7* 03/22/2013   MCV 90.9 03/22/2013   PLT 206 03/22/2013   ASSESSMENT & PLAN:  #1 B12 deficiency Her most recent B12 is adequately replaced. I recommend oral B12 supplements. She agreed. #2 anemia chronic disease She responded well to Aranesp injection recently. The goal is to keep her hemoglobin above 10 g. She does not need an injection today. I scheduled her to come back in 6 weeks to have her blood rechecked #3 severe left knee pain She will continue followup with the orthopedic surgeon #4 chewing snuff The patient is not interested to quit. #5 anorexia She felt Remeron is not helpful. I recommend a trial of low-dose Megace. All questions were answered. The patient knows to call the clinic with any problems, questions or concerns. No barriers to learning was detected.  I spent 15 minutes counseling the patient face to face. The total time spent in the appointment was 20 minutes and more than 50% was on counseling.     Cylee Dattilo, MD 03/22/2013 10:55 AM

## 2013-03-22 NOTE — Telephone Encounter (Signed)
Gave pt appt for lab,md and injections for [facility] 2015

## 2013-04-03 ENCOUNTER — Other Ambulatory Visit: Payer: Self-pay | Admitting: Neurology

## 2013-04-13 ENCOUNTER — Ambulatory Visit: Payer: Medicare Other | Admitting: Cardiovascular Disease

## 2013-04-15 ENCOUNTER — Encounter: Payer: Self-pay | Admitting: *Deleted

## 2013-04-15 ENCOUNTER — Encounter (HOSPITAL_COMMUNITY): Payer: Self-pay | Admitting: Pharmacy Technician

## 2013-04-16 ENCOUNTER — Encounter (HOSPITAL_COMMUNITY): Payer: Self-pay | Admitting: *Deleted

## 2013-04-16 ENCOUNTER — Ambulatory Visit (INDEPENDENT_AMBULATORY_CARE_PROVIDER_SITE_OTHER): Payer: Medicare Other | Admitting: Physician Assistant

## 2013-04-16 ENCOUNTER — Encounter: Payer: Self-pay | Admitting: Physician Assistant

## 2013-04-16 VITALS — BP 130/74 | HR 66 | Ht 64.0 in | Wt 92.0 lb

## 2013-04-16 DIAGNOSIS — Z01818 Encounter for other preprocedural examination: Secondary | ICD-10-CM

## 2013-04-16 DIAGNOSIS — I959 Hypotension, unspecified: Secondary | ICD-10-CM

## 2013-04-16 DIAGNOSIS — E785 Hyperlipidemia, unspecified: Secondary | ICD-10-CM | POA: Diagnosis not present

## 2013-04-16 NOTE — Patient Instructions (Signed)
1.  We will schedule a stress test.  If ok, you will be cleared for surgery.

## 2013-04-16 NOTE — Progress Notes (Signed)
o    Date:  04/18/2013   ID:  [name], DOB [birth year], MRN [id number]  PCP:  Delorse Lek, MD  Primary Cardiologist:  Allyson Sabal Nephrologist:  Shea Evans    History of Present Illness: [name] is a 80 y.o. female with a history of CKD with AV fistula placed last year, hypertension, anxiety and depression, chronic headaches, diverticular disease.  She was last seen by Dr. Allyson Sabal in April 2014.  [name] scheduled to have a left hip replacement on 04/27/13.  The right was done ~10 yearrs ago.  No complaints of angina but does have a sharp CP once and awhile.  Her shin is very thin and she had multiple band-aids on her legs for minor injury/skin tears.  She does not smoke but dips tobacco and has for ~50 years.  The patient currently denies nausea, vomiting, fever, shortness of breath, orthopnea, dizziness, PND, cough, congestion, abdominal pain, hematochezia, melena, lower extremity edema, claudication.  Wt Readings from Last 3 Encounters:  04/16/13 92 lb (41.731 kg)  02/08/13 105 lb 12.8 oz (47.991 kg)  01/11/13 104 lb 9.6 oz (47.446 kg)     Past Medical History  Diagnosis Date  . Hypertension   . Anxiety and depression   . Chronic headaches   . Diverticular disease   . Anemia   . Arthritis     osteoarthritis  . Osteoporosis   . Vitamin B 12 deficiency   . History of ITP   . Anxiety     takes xanax for sleep  . Diabetes mellitus     NIDDM x 12 years  . Heart murmur     benign;asymptomatic  . Chronic kidney disease     ESRD - AV fistula in LUE (Dr. Carlean Jews = nephrologist)  . Left knee pain 02/08/2013  . Cachexia 02/08/2013  . Hyperlipidemia     Current Outpatient Prescriptions  Medication Sig Dispense Refill  . acetaminophen (TYLENOL) 500 MG tablet Take 500 mg by mouth 2 (two) times daily as needed. For pain.      Marland Kitchen atenolol (TENORMIN) 25 MG tablet Take 25 mg by mouth 2 (two) times daily.       Marland Kitchen atorvastatin (LIPITOR) 20 MG tablet Take 20 mg by mouth every  evening. daily      . Cholecalciferol (VITAMIN D-3) 1000 UNITS CAPS Take 1,000 Units by mouth daily.      . cloNIDine (CATAPRES) 0.1 MG tablet Take 0.1 mg by mouth 2 (two) times daily.       . darbepoetin (ARANESP) 150 MCG/0.3ML SOLN Inject 150 mcg into the skin every 30 (thirty) days. As needed. Only receives if blood test is low      . furosemide (LASIX) 20 MG tablet Take 20 mg by mouth daily.       Marland Kitchen levETIRAcetam (KEPPRA) 250 MG tablet Take 250 mg by mouth 2 (two) times daily.      . megestrol (MEGACE) 40 MG tablet Take 1 tablet (40 mg total) by mouth 2 (two) times daily.  60 tablet  1  . mirtazapine (REMERON) 15 MG tablet Take 15 mg by mouth at bedtime.      Marland Kitchen oxybutynin (DITROPAN) 5 MG tablet Take 5 mg by mouth daily.       . phenytoin (DILANTIN) 100 MG ER capsule Take 100 mg by mouth 2 (two) times daily.      . vitamin B-12 (CYANOCOBALAMIN) 1000 MCG tablet Take 1 tablet (1,000 mcg total) by mouth  daily.  30 tablet  6   No current facility-administered medications for this visit.    Allergies:   No Known Allergies  Social History:  The patient  reports that she has never smoked. Her smokeless tobacco use includes Snuff. She reports that she does not drink alcohol or use illicit drugs.   Family history:   Family History  Problem Relation Age of Onset  . Stroke Mother   . Heart disease Father     ROS:  Please see the history of present illness.  All other systems reviewed and negative.   PHYSICAL EXAM: VS:  BP 130/74  Pulse 66  Ht 5\' 4"  (1.626 m)  Wt 92 lb (41.731 kg)  BMI 15.78 kg/m2 Well nourished, well developed, in no acute distress HEENT: Pupils are equal round react to light accommodation extraocular movements are intact.  Neck: no JVDNo cervical lymphadenopathy. Cardiac: Regular rate and rhythm with 1/6 sys murmur. No rubs or gallops. Lungs:  clear to auscultation bilaterally, no wheezing, rhonchi or rales Abd: soft, nontender, positive bowel sounds all quadrants, no  hepatosplenomegaly Ext: no lower extremity edema.  2+ radial and dorsalis pedis pulses. Skin: warm and dry.  Multiple skin tears with bandages in place Neuro:  Grossly normal  EKG:  NSR  66bpm Q V1-2  ASSESSMENT AND PLAN:  Problem List Items Addressed This Visit   Hypotension     BP controlled on clonidine and atenolol    Pre-operative clearance     She will be scheduled for a Lexiscan myoview.  If negative, clearance will be given.    HLD (hyperlipidemia)     On lipitor     Other Visit Diagnoses   Pre-op evaluation    -  Primary    Relevant Orders       EKG 12-Lead       Myocardial Perfusion Imaging

## 2013-04-18 DIAGNOSIS — Z01818 Encounter for other preprocedural examination: Secondary | ICD-10-CM | POA: Insufficient documentation

## 2013-04-18 DIAGNOSIS — E785 Hyperlipidemia, unspecified: Secondary | ICD-10-CM | POA: Insufficient documentation

## 2013-04-18 NOTE — Assessment & Plan Note (Signed)
BP controlled on clonidine and atenolol

## 2013-04-18 NOTE — Assessment & Plan Note (Signed)
She will be scheduled for a Lexiscan myoview.  If negative, clearance will be given.

## 2013-04-18 NOTE — Assessment & Plan Note (Signed)
On lipitor

## 2013-04-20 ENCOUNTER — Other Ambulatory Visit: Payer: Self-pay | Admitting: Neurology

## 2013-04-20 ENCOUNTER — Inpatient Hospital Stay (HOSPITAL_COMMUNITY): Admission: RE | Admit: 2013-04-20 | Payer: Medicare Other | Source: Ambulatory Visit

## 2013-04-20 NOTE — Progress Notes (Addendum)
ekg 03-04-2013 epic Echo 03-03-2011 epic Chest xray 03-04-2013 epic Nephrology clearance note dr Eliott Ninedunham 04-08-2013 on chart Cardiac clearance note bryan hager pac  04-16-2013 epic

## 2013-04-21 ENCOUNTER — Encounter (HOSPITAL_COMMUNITY)
Admission: RE | Admit: 2013-04-21 | Discharge: 2013-04-21 | Disposition: A | Discharge status: Home / Self Care | Payer: Medicare Other | Source: Ambulatory Visit | Attending: Orthopedic Surgery | Admitting: Orthopedic Surgery

## 2013-04-21 ENCOUNTER — Other Ambulatory Visit: Payer: Self-pay | Admitting: Hematology and Oncology

## 2013-04-21 ENCOUNTER — Encounter (HOSPITAL_COMMUNITY): Payer: Self-pay

## 2013-04-21 HISTORY — DX: Tremor, unspecified: R25.1

## 2013-04-21 LAB — SURGICAL PCR SCREEN
MRSA, PCR: NEGATIVE
Staphylococcus aureus: NEGATIVE

## 2013-04-21 LAB — CBC
HCT: 29.4 % — ABNORMAL LOW (ref 36.0–46.0)
HEMOGLOBIN: 9.2 g/dL — AB (ref 12.0–15.0)
MCH: 28.6 pg (ref 26.0–34.0)
MCHC: 31.3 g/dL (ref 30.0–36.0)
MCV: 91.3 fL (ref 78.0–100.0)
PLATELETS: 188 10*3/uL (ref 150–400)
RBC: 3.22 MIL/uL — ABNORMAL LOW (ref 3.87–5.11)
RDW: 14.5 % (ref 11.5–15.5)
WBC: 8.8 10*3/uL (ref 4.0–10.5)

## 2013-04-21 LAB — BASIC METABOLIC PANEL
BUN: 41 mg/dL — ABNORMAL HIGH (ref 6–23)
CALCIUM: 8.8 mg/dL (ref 8.4–10.5)
CO2: 19 mEq/L (ref 19–32)
Chloride: 105 mEq/L (ref 96–112)
Creatinine, Ser: 2.48 mg/dL — ABNORMAL HIGH (ref 0.50–1.10)
GFR calc Af Amer: 20 mL/min — ABNORMAL LOW (ref >90)
GFR calc non Af Amer: 18 mL/min — ABNORMAL LOW (ref >90)
Glucose, Bld: 88 mg/dL (ref 70–99)
POTASSIUM: 5.5 meq/L — AB (ref 3.7–5.3)
Sodium: 138 mEq/L (ref 137–147)

## 2013-04-21 LAB — PROTIME-INR
INR: 1.07 (ref 0.00–1.49)
PROTHROMBIN TIME: 13.7 s (ref 11.6–15.2)

## 2013-04-21 LAB — URINALYSIS, ROUTINE W REFLEX MICROSCOPIC
Bilirubin Urine: NEGATIVE
GLUCOSE, UA: NEGATIVE mg/dL
KETONES UR: NEGATIVE mg/dL
Nitrite: NEGATIVE
PROTEIN: 100 mg/dL — AB
Specific Gravity, Urine: 1.015 (ref 1.005–1.030)
Urobilinogen, UA: 1 mg/dL (ref 0.0–1.0)
pH: 6.5 (ref 5.0–8.0)

## 2013-04-21 LAB — URINE MICROSCOPIC-ADD ON

## 2013-04-21 LAB — APTT: APTT: 27 s (ref 24–37)

## 2013-04-21 LAB — ABO/RH: ABO/RH(D): O POS

## 2013-04-21 NOTE — Patient Instructions (Addendum)
20 Dante GangHelen L Cuffe  04/21/2013   Your procedure is scheduled on: Tuesday February 3rd  [facility].  Call this number if you have problems the morning of surgery [phone]   Remember:  Do not eat food or drink liquids :After Midnight.     Take these medicines the morning of surgery with A SIP OF WATER: atenolol, clonidine, keppra, dilantin                                SEE [facility] [facility] PREPARING FOR SURGERY SHEET             You may not have any metal on your body including hair pins and piercings  Do not wear jewelry, make-up.  Do not wear lotions, powders, or perfumes. No  Deodorant is to be worn.   Men may shave face and neck.  Do not bring valuables to the hospital. [facility] IS NOT RESPONSIBLE FOR VALUEABLES.  Contacts, dentures or bridgework may not be worn into surgery.  Leave suitcase in the car. After surgery it may be brought to your room.  For patients admitted to the hospital, checkout time is 11:00 AM the day of discharge.   Please read over the following fact sheets that you were given: [facility] Pre[facility] sheet, MRSA information, incentive spirometer sheet, blood fact sheet  Call Cain SieveSharon Jameal Razzano RN pre op nurse if needed [phone]    FAILURE TO FOLLOW THESE INSTRUCTIONS MAY RESULT IN THE CANCELLATION OF YOUR SURGERY.  PATIENT SIGNATURE___________________________________________  NURSE SIGNATURE_____________________________________________

## 2013-04-21 NOTE — Progress Notes (Signed)
Cbc, bmet, micro, ua results faxed by epic to dr Charlann Boxerolin

## 2013-04-23 ENCOUNTER — Ambulatory Visit (HOSPITAL_COMMUNITY)
Admission: RE | Admit: 2013-04-23 | Discharge: 2013-04-23 | Disposition: A | Discharge status: Home / Self Care | Payer: Medicare Other | Source: Ambulatory Visit | Attending: Cardiovascular Disease | Admitting: Cardiovascular Disease

## 2013-04-23 DIAGNOSIS — Z01818 Encounter for other preprocedural examination: Secondary | ICD-10-CM

## 2013-04-23 DIAGNOSIS — Z01810 Encounter for preprocedural cardiovascular examination: Secondary | ICD-10-CM | POA: Insufficient documentation

## 2013-04-23 DIAGNOSIS — R0609 Other forms of dyspnea: Secondary | ICD-10-CM | POA: Insufficient documentation

## 2013-04-23 DIAGNOSIS — R110 Nausea: Secondary | ICD-10-CM | POA: Insufficient documentation

## 2013-04-23 DIAGNOSIS — R079 Chest pain, unspecified: Secondary | ICD-10-CM | POA: Insufficient documentation

## 2013-04-23 DIAGNOSIS — R0989 Other specified symptoms and signs involving the circulatory and respiratory systems: Secondary | ICD-10-CM | POA: Insufficient documentation

## 2013-04-23 DIAGNOSIS — R42 Dizziness and giddiness: Secondary | ICD-10-CM | POA: Insufficient documentation

## 2013-04-23 MED ORDER — TECHNETIUM TC 99M SESTAMIBI GENERIC - CARDIOLITE
10.1000 | Freq: Once | INTRAVENOUS | Status: AC | PRN
  Administered 2013-04-23: 10.1 via INTRAVENOUS

## 2013-04-23 MED ORDER — REGADENOSON 0.4 MG/5ML IV SOLN
0.4000 mg | Freq: Once | INTRAVENOUS | Status: AC
  Administered 2013-04-23: 0.4 mg via INTRAVENOUS

## 2013-04-23 MED ORDER — AMINOPHYLLINE 25 MG/ML IV SOLN
75.0000 mg | Freq: Once | INTRAVENOUS | Status: AC
  Administered 2013-04-23: 75 mg via INTRAVENOUS

## 2013-04-23 MED ORDER — TECHNETIUM TC 99M SESTAMIBI GENERIC - CARDIOLITE
30.6000 | Freq: Once | INTRAVENOUS | Status: AC | PRN
  Administered 2013-04-23: 31 via INTRAVENOUS

## 2013-04-23 NOTE — Procedures (Addendum)
[facility]  [facility] [address] [place] [address] [phone]  Cardiology Nuclear Med Study  [name] is a 80 y.o. female     MRN : [id number]     DOB: 08/28/1932  Procedure Date: 04/23/2013  Nuclear Med Background Indication for Stress Test:  Surgical Clearance History:  heart murmur;seizures;acute respiratory failure;acute on chronic renal failure Cardiac Risk Factors: Family History - CAD, Hypertension, Lipids and NIDDM  Symptoms:  Chest Pain, Dizziness, DOE, Light-Headedness and Nausea   Nuclear Pre-Procedure Caffeine/Decaff Intake:  10:00pm NPO After: 8:00am   IV Site: R Forearm  IV 0.9% NS with Angio Cath:  22g  Chest Size (in):  n/a IV Started by: Emmit PomfretAmanda Hicks, RN  Height: 5\' 4"  (1.626 m)  Cup Size: A  BMI:  Body mass index is 15.78 kg/(m^2). Weight:  92 lb (41.731 kg)   Tech Comments:  n/a    Nuclear Med Study 1 or 2 day study: 1 day  Stress Test Type:  Lexiscan  Order Authorizing Provider:  Nanetta BattyJonathan Berry, MD   Resting Radionuclide: Technetium 2514m Sestamibi  Resting Radionuclide Dose: 10.1 mCi   Stress Radionuclide:  Technetium 7714m Sestamibi  Stress Radionuclide Dose: 30.6 mCi           Stress Protocol Rest HR: 72 Stress HR: 88  Rest BP: 125/89 Stress BP: 155/85  Exercise Time (min): n/a METS: n/a   Predicted Max HR: 142 bpm % Max HR: 70.42 bpm Rate Pressure Product: [id number]  Dose of Adenosine (mg):  n/a Dose of Lexiscan: 0.4 mg  Dose of Atropine (mg): n/a Dose of Dobutamine: n/a mcg/kg/min (at max HR)  Stress Test Technologist: Esperanza Sheetserry-Marie Martin, CCT Nuclear Technologist: Gonzella LexPam Phillips, CNMT   Rest Procedure:  Myocardial perfusion imaging was performed at rest 45 minutes following the intravenous administration of Technetium 4914m Sestamibi. Stress Procedure:  The patient received IV Lexiscan 0.4 mg over 15-seconds.  Technetium 8014m Sestamibi injected at 30-seconds.  There were no significant changes with  Lexiscan.  The patient ws still SOB at 4:30, in recover, 75 MG of IV Aminophylline was administered with resolution of symptoms.  Quantitative spect images were obtained after a 45 minute delay.  Transient Ischemic Dilatation (Normal <1.22):  0.96 Lung/Heart Ratio (Normal <0.45):  0.23 QGS EDV:  n/l ml QGS ESV:  n/l ml LV Ejection Fraction: Study not gated  Signed by      Rest ECG: NSR with non-specific ST-T wave changes and conduction delay with prominent Q wave in V2  Stress ECG: Occasional PVC's  QPS Raw Data Images:  Normal; no motion artifact; normal heart/lung ratio. Stress Images:  Small in size and mild in intensity mid to basal inferior defect Rest Images:  There is decreased uptake in the inferior wall which may be contributed by attenuation artifact Subtraction (SDS):  There is a fixed inferior defect that is most consistent with diaphragmatic attenuation but a minimal region of ischemia cannot be excluded.  Impression Exercise Capacity:  Lexiscan with no exercise. BP Response:  Normal blood pressure response. Clinical Symptoms:  Pateint was shaking during the study ECG Impression:  No significant ST segment change suggestive of ischemia. Comparison with Prior Nuclear Study: No images to compare  Overall Impression:  Low risk stress nuclear study demonstrating probable inferior attenuation but a mild region of nontransmural inferior scar with minimal periinfart ischemia cannot be excluded.   LV Wall Motion:  Not gated   Lennette BihariKELLY,Tawan Corkern A, MD  04/23/2013  5:58 PM

## 2013-04-24 NOTE — H&P (Signed)
TOTAL HIP ADMISSION H&P  Patient is admitted for left total hip arthroplasty, anterior approach.  Subjective:  Chief Complaint: Left hip OA / pain  HPI: [name], 80 y.o. female, has a history of pain and functional disability in the left hip(s) due to arthritis and patient has failed non-surgical conservative treatments for greater than 12 weeks to include NSAID's and/or analgesics, use of assistive devices and activity modification.  Onset of symptoms was gradual starting 1+ years ago with gradually worsening course since that time.The patient noted prior procedures of the hip to include arthroplasty on the right hip(s).  Patient currently rates pain in the left hip at 8 out of 10 with activity. Patient has worsening of pain with activity and weight bearing, trendelenberg gait, pain that interfers with activities of daily living and pain with passive range of motion. Patient has evidence of periarticular osteophytes and joint space narrowing by imaging studies. This condition presents safety issues increasing the risk of falls.  There is no current active infection.  Risks, benefits and expectations were discussed with the patient.  Risks including but not limited to the risk of anesthesia, blood clots, nerve damage, blood vessel damage, failure of the prosthesis, infection and up to and including death.  Patient understand the risks, benefits and expectations and wishes to proceed with surgery.   D/C Plans:   Home with HHPT  Post-op Meds:    No Rx given  Tranexamic Acid:      To be given  Decadron:        Not to be given - DM  FYI:   NO Potassium in IV fluid - tendency to hyperkalemia  Monitor of hyperkalemia, previously need dialysis    Wants to try APAP for pain  Lovenox post-op  Patient Active Problem List   Diagnosis Date Noted  . Pre-operative clearance 04/18/2013  . HLD (hyperlipidemia) 04/18/2013  . Left knee pain 02/08/2013  . Cachexia 02/08/2013  . Candiduria 08/21/2011   . History of ITP 08/21/2011  . Ileus following gastrointestinal surgery 08/20/2011  . Encephalopathy acute 08/19/2011  . Hypotension 08/19/2011  . Acute respiratory failure 08/19/2011  . Status post laparotomy 08/19/2011  . Hyperkalemia 08/18/2011  . Seizure, convulsive 08/12/2011  . Anxiety and depression   . Recurrent incisional hernia with incarceration 08/09/2011  . Thrombocytopenia 01/20/2011  . Vitamin B12 deficiency anemia 01/20/2011  . Acute on chronic renal insufficiency 01/20/2011  . Anemia of renal disease 01/20/2011  . DIVERTICULOSIS, COLON 05/25/2008  . NAUSEA 05/25/2008   Past Medical History  Diagnosis Date  . Hypertension   . Anxiety and depression   . Chronic headaches   . Diverticular disease   . Anemia   . Arthritis     osteoarthritis  . Osteoporosis   . Vitamin B 12 deficiency   . History of ITP   . Anxiety     takes xanax for sleep  . Diabetes mellitus     NIDDM x 12 years  . Heart murmur     benign;asymptomatic  . Chronic kidney disease     ESRD - AV fistula in LUE (Dr. Carlean Jewsynrthia Dunham = nephrologist)  . Left knee pain 02/08/2013  . Cachexia 02/08/2013  . Hyperlipidemia   . Tremors of nervous system     Past Surgical History  Procedure Laterality Date  . Av fistula placement  11/15/10    left upper arm AVF  . Abdominal hysterectomy  1973  . Joint replacement  2007  rt hip  . Eye surgery    . Cataract extraction w/ intraocular lens  implant, bilateral  2002  . Vascular surgery  10-2010    left AVF  . Hernia repair  2000    ventral  . Ventral hernia repair  08/06/2011    Procedure: HERNIA REPAIR VENTRAL ADULT;  Surgeon: Liz Malady, MD;  Location: [facility] OR;  Service: General;  Laterality: N/A;  Open Repair [employer] Hernia with Mesh  . Cholecystectomy  yrs ago    No prescriptions prior to admission   No Known Allergies   History  Substance Use Topics  . Smoking status: Never Smoker   . Smokeless tobacco: Current User     Types: Snuff  . Alcohol Use: No    Family History  Problem Relation Age of Onset  . Stroke Mother   . Heart disease Father      Review of Systems  Constitutional: Negative.   Eyes: Negative.   Respiratory: Negative.   Cardiovascular: Negative.   Gastrointestinal: Negative.   Genitourinary: Negative.   Musculoskeletal: Negative.   Skin: Negative.   Neurological: Positive for headaches.  Endo/Heme/Allergies: Negative.   Psychiatric/Behavioral: Positive for depression. The patient is nervous/anxious.     Objective:  Physical Exam  Constitutional: [name] oriented to person, place, and time. She appears well-developed and well-nourished.  HENT:  Head: Normocephalic and atraumatic.  Mouth/Throat: Oropharynx is clear and moist.  Eyes: Pupils are equal, round, and reactive to light.  Neck: Neck supple. No JVD present. No tracheal deviation present. No thyromegaly present.  Cardiovascular: Normal rate, regular rhythm and intact distal pulses.   Murmur heard. Respiratory: Effort normal and breath sounds normal. No stridor. No respiratory distress. She has no wheezes.  GI: Soft. There is no tenderness. There is no guarding.  Musculoskeletal:       Left hip: She exhibits decreased range of motion, decreased strength, tenderness and bony tenderness. She exhibits no swelling, no deformity and no laceration.  Lymphadenopathy:    She has no cervical adenopathy.  Neurological: [name] alert and oriented to person, place, and time.  Skin: Skin is warm and dry.  Psychiatric: She has a normal mood and affect.     Labs:  Estimated body mass index is 18.75 kg/(m^2) as calculated from the following:   Height as of 02/08/13: 5\' 3"  (1.6 m).   Weight as of 02/08/13: 47.991 kg (105 lb 12.8 oz).   Imaging Review Plain radiographs demonstrate severe degenerative joint disease of the left hip(s). The bone quality appears to be good for age and reported activity  level.  Assessment/Plan:  End stage arthritis, left hip(s)  The patient history, physical examination, clinical judgement of the provider and imaging studies are consistent with end stage degenerative joint disease of the left hip(s) and total hip arthroplasty is deemed medically necessary. The treatment options including medical management, injection therapy, arthroscopy and arthroplasty were discussed at length. The risks and benefits of total hip arthroplasty were presented and reviewed. The risks due to aseptic loosening, infection, stiffness, dislocation/subluxation,  thromboembolic complications and other imponderables were discussed.  The patient acknowledged the explanation, agreed to proceed with the plan and consent was signed. Patient is being admitted for inpatient treatment for surgery, pain control, PT, OT, prophylactic antibiotics, VTE prophylaxis, progressive ambulation and ADL's and discharge planning.The patient is planning to be discharged home with home health services.      Anastasio Auerbach Elzia Hott   PAC  04/24/2013, 11:04  AM  

## 2013-04-26 NOTE — Anesthesia Preprocedure Evaluation (Signed)
Anesthesia Evaluation  Patient identified by MRN, date of birth, ID band Patient awake    Reviewed: Allergy & Precautions, H&P , NPO status , Patient's Chart, lab work & pertinent test results  Airway Mallampati: I TM Distance: >3 FB Neck ROM: full    Dental   Pulmonary neg pulmonary ROS,          Cardiovascular hypertension, Pt. on home beta blockers and Pt. on medications + Valvular Problems/Murmurs Rhythm:regular Rate:Normal     Neuro/Psych  Headaches, Seizures -,  PSYCHIATRIC DISORDERS Anxiety    GI/Hepatic negative GI ROS,   Endo/Other  diabetes  Renal/GU CRF and Renal InsufficiencyRenal disease     Musculoskeletal   Abdominal   Peds  Hematology  (+) anemia ,   Anesthesia Other Findings   Reproductive/Obstetrics                           Anesthesia Physical  Anesthesia Plan  ASA: III  Anesthesia Plan: Spinal   Post-op Pain Management:    Induction:   Airway Management Planned:   Additional Equipment:   Intra-op Plan:   Post-operative Plan:   Informed Consent: I have reviewed the patients History and Physical, chart, labs and discussed the procedure including the risks, benefits and alternatives for the proposed anesthesia with the patient or authorized representative who has indicated his/her understanding and acceptance.   Dental advisory given  Plan Discussed with: CRNA  Anesthesia Plan Comments:         Anesthesia Quick Evaluation

## 2013-04-27 ENCOUNTER — Encounter (HOSPITAL_COMMUNITY): Payer: Medicare Other | Admitting: Anesthesiology

## 2013-04-27 ENCOUNTER — Encounter (HOSPITAL_COMMUNITY)
Admission: RE | Disposition: A | Discharge status: Home Health | Payer: Self-pay | Source: Ambulatory Visit | Attending: Orthopedic Surgery

## 2013-04-27 ENCOUNTER — Encounter: Payer: Self-pay | Admitting: *Deleted

## 2013-04-27 ENCOUNTER — Inpatient Hospital Stay (HOSPITAL_COMMUNITY): Payer: Medicare Other

## 2013-04-27 ENCOUNTER — Telehealth: Payer: Self-pay | Admitting: *Deleted

## 2013-04-27 ENCOUNTER — Inpatient Hospital Stay (HOSPITAL_COMMUNITY)
Admission: RE | Admit: 2013-04-27 | Discharge: 2013-04-29 | DRG: 469 | Disposition: A | Discharge status: Home Health | Payer: Medicare Other | Source: Ambulatory Visit | Attending: Orthopedic Surgery | Admitting: Orthopedic Surgery

## 2013-04-27 ENCOUNTER — Inpatient Hospital Stay (HOSPITAL_COMMUNITY): Payer: Medicare Other | Admitting: Anesthesiology

## 2013-04-27 ENCOUNTER — Encounter (HOSPITAL_COMMUNITY): Payer: Self-pay | Admitting: *Deleted

## 2013-04-27 DIAGNOSIS — I120 Hypertensive chronic kidney disease with stage 5 chronic kidney disease or end stage renal disease: Secondary | ICD-10-CM | POA: Diagnosis present

## 2013-04-27 DIAGNOSIS — F411 Generalized anxiety disorder: Secondary | ICD-10-CM | POA: Diagnosis present

## 2013-04-27 DIAGNOSIS — D693 Immune thrombocytopenic purpura: Secondary | ICD-10-CM | POA: Diagnosis present

## 2013-04-27 DIAGNOSIS — Z96649 Presence of unspecified artificial hip joint: Secondary | ICD-10-CM

## 2013-04-27 DIAGNOSIS — F329 Major depressive disorder, single episode, unspecified: Secondary | ICD-10-CM | POA: Diagnosis present

## 2013-04-27 DIAGNOSIS — R011 Cardiac murmur, unspecified: Secondary | ICD-10-CM | POA: Diagnosis present

## 2013-04-27 DIAGNOSIS — N390 Urinary tract infection, site not specified: Secondary | ICD-10-CM | POA: Diagnosis present

## 2013-04-27 DIAGNOSIS — E119 Type 2 diabetes mellitus without complications: Secondary | ICD-10-CM | POA: Diagnosis present

## 2013-04-27 DIAGNOSIS — M1610 Unilateral primary osteoarthritis, unspecified hip: Principal | ICD-10-CM | POA: Diagnosis present

## 2013-04-27 DIAGNOSIS — Z681 Body mass index (BMI) 19 or less, adult: Secondary | ICD-10-CM

## 2013-04-27 DIAGNOSIS — M169 Osteoarthritis of hip, unspecified: Principal | ICD-10-CM | POA: Diagnosis present

## 2013-04-27 DIAGNOSIS — Z01810 Encounter for preprocedural cardiovascular examination: Secondary | ICD-10-CM

## 2013-04-27 DIAGNOSIS — M810 Age-related osteoporosis without current pathological fracture: Secondary | ICD-10-CM | POA: Diagnosis present

## 2013-04-27 DIAGNOSIS — E785 Hyperlipidemia, unspecified: Secondary | ICD-10-CM | POA: Diagnosis present

## 2013-04-27 DIAGNOSIS — E43 Unspecified severe protein-calorie malnutrition: Secondary | ICD-10-CM | POA: Insufficient documentation

## 2013-04-27 DIAGNOSIS — E875 Hyperkalemia: Secondary | ICD-10-CM | POA: Diagnosis present

## 2013-04-27 DIAGNOSIS — N186 End stage renal disease: Secondary | ICD-10-CM | POA: Diagnosis present

## 2013-04-27 DIAGNOSIS — D62 Acute posthemorrhagic anemia: Secondary | ICD-10-CM | POA: Diagnosis not present

## 2013-04-27 DIAGNOSIS — F3289 Other specified depressive episodes: Secondary | ICD-10-CM | POA: Diagnosis present

## 2013-04-27 DIAGNOSIS — Z01812 Encounter for preprocedural laboratory examination: Secondary | ICD-10-CM

## 2013-04-27 DIAGNOSIS — R636 Underweight: Secondary | ICD-10-CM | POA: Diagnosis present

## 2013-04-27 DIAGNOSIS — D518 Other vitamin B12 deficiency anemias: Secondary | ICD-10-CM | POA: Diagnosis present

## 2013-04-27 HISTORY — PX: TOTAL HIP ARTHROPLASTY: SHX124

## 2013-04-27 LAB — URINALYSIS, ROUTINE W REFLEX MICROSCOPIC
Bilirubin Urine: NEGATIVE
Glucose, UA: NEGATIVE mg/dL
KETONES UR: NEGATIVE mg/dL
NITRITE: NEGATIVE
Protein, ur: 30 mg/dL — AB
Specific Gravity, Urine: 1.013 (ref 1.005–1.030)
UROBILINOGEN UA: 0.2 mg/dL (ref 0.0–1.0)
pH: 5.5 (ref 5.0–8.0)

## 2013-04-27 LAB — GLUCOSE, CAPILLARY
GLUCOSE-CAPILLARY: 83 mg/dL (ref 70–99)
GLUCOSE-CAPILLARY: 154 mg/dL — AB (ref 70–99)
Glucose-Capillary: 89 mg/dL (ref 70–99)
Glucose-Capillary: 91 mg/dL (ref 70–99)
Glucose-Capillary: 231 mg/dL — ABNORMAL HIGH (ref 70–99)

## 2013-04-27 LAB — URINE MICROSCOPIC-ADD ON

## 2013-04-27 SURGERY — ARTHROPLASTY, HIP, TOTAL, ANTERIOR APPROACH
Anesthesia: Spinal | Site: Hip | Laterality: Left

## 2013-04-27 MED ORDER — POLYETHYLENE GLYCOL 3350 17 G PO PACK: 17.0000 g | PACK | Freq: Every day | ORAL | Status: DC | PRN

## 2013-04-27 MED ORDER — HYDROCODONE-ACETAMINOPHEN 5-325 MG PO TABS: 1.0000 | ORAL_TABLET | ORAL | Status: DC | PRN

## 2013-04-27 MED ORDER — ENSURE COMPLETE PO LIQD
237.0000 mL | Freq: Three times a day (TID) | ORAL | Status: DC
  Administered 2013-04-27 – 2013-04-28 (×3): 237 mL via ORAL

## 2013-04-27 MED ORDER — ACETAMINOPHEN 325 MG PO TABS
650.0000 mg | ORAL_TABLET | Freq: Four times a day (QID) | ORAL | Status: DC | PRN
  Administered 2013-04-27 – 2013-04-28 (×3): 650 mg via ORAL
  Filled 2013-04-27 (×3): qty 2

## 2013-04-27 MED ORDER — CIPROFLOXACIN IN D5W 200 MG/100ML IV SOLN
200.0000 mg | Freq: Two times a day (BID) | INTRAVENOUS | Status: DC
  Administered 2013-04-27: 200 mg via INTRAVENOUS
  Filled 2013-04-27: qty 100

## 2013-04-27 MED ORDER — BUPIVACAINE IN DEXTROSE 0.75-8.25 % IT SOLN
INTRATHECAL | Status: DC | PRN
  Administered 2013-04-27: 1.8 mL via INTRATHECAL

## 2013-04-27 MED ORDER — FENTANYL CITRATE 0.05 MG/ML IJ SOLN
INTRAMUSCULAR | Status: DC | PRN
Start: 2013-04-27 — End: 2013-04-27
  Administered 2013-04-27 (×2): 50 ug via INTRAVENOUS

## 2013-04-27 MED ORDER — FLEET ENEMA 7-19 GM/118ML RE ENEM: 1.0000 | ENEMA | Freq: Once | RECTAL | Status: AC | PRN

## 2013-04-27 MED ORDER — FUROSEMIDE 20 MG PO TABS
20.0000 mg | ORAL_TABLET | Freq: Every day | ORAL | Status: DC
  Administered 2013-04-27 – 2013-04-28 (×2): 20 mg via ORAL
  Filled 2013-04-27 (×3): qty 1

## 2013-04-27 MED ORDER — CEFAZOLIN SODIUM-DEXTROSE 2-3 GM-% IV SOLR
INTRAVENOUS | Status: AC
  Filled 2013-04-27: qty 50

## 2013-04-27 MED ORDER — PROPOFOL INFUSION 10 MG/ML OPTIME
INTRAVENOUS | Status: DC | PRN
  Administered 2013-04-27: 25 ug/kg/min via INTRAVENOUS

## 2013-04-27 MED ORDER — SODIUM CHLORIDE 0.9 % IV SOLN
INTRAVENOUS | Status: DC
Start: 2013-04-27 — End: 2013-04-29
  Administered 2013-04-27 – 2013-04-28 (×3): via INTRAVENOUS

## 2013-04-27 MED ORDER — ONDANSETRON HCL 4 MG/2ML IJ SOLN: 4.0000 mg | Freq: Four times a day (QID) | INTRAMUSCULAR | Status: DC | PRN

## 2013-04-27 MED ORDER — DEXTROSE 5 % IV SOLN
500.0000 mg | Freq: Four times a day (QID) | INTRAVENOUS | Status: DC | PRN
  Filled 2013-04-27 (×2): qty 5

## 2013-04-27 MED ORDER — MENTHOL 3 MG MT LOZG: 1.0000 | LOZENGE | OROMUCOSAL | Status: DC | PRN

## 2013-04-27 MED ORDER — BISACODYL 10 MG RE SUPP: 10.0000 mg | Freq: Every day | RECTAL | Status: DC | PRN

## 2013-04-27 MED ORDER — LACTATED RINGERS IV SOLN
INTRAVENOUS | Status: DC | PRN
  Administered 2013-04-27 (×2): via INTRAVENOUS

## 2013-04-27 MED ORDER — FERROUS SULFATE 325 (65 FE) MG PO TABS
325.0000 mg | ORAL_TABLET | Freq: Three times a day (TID) | ORAL | Status: DC
  Administered 2013-04-27 – 2013-04-28 (×5): 325 mg via ORAL
  Filled 2013-04-27 (×9): qty 1

## 2013-04-27 MED ORDER — CIPROFLOXACIN HCL 250 MG PO TABS: 250.0000 mg | ORAL_TABLET | Freq: Two times a day (BID) | ORAL | Status: DC

## 2013-04-27 MED ORDER — MEGESTROL ACETATE 40 MG PO TABS
40.0000 mg | ORAL_TABLET | Freq: Two times a day (BID) | ORAL | Status: DC
  Administered 2013-04-27 – 2013-04-28 (×4): 40 mg via ORAL
  Filled 2013-04-27 (×6): qty 1

## 2013-04-27 MED ORDER — CEFAZOLIN SODIUM-DEXTROSE 2-3 GM-% IV SOLR
2.0000 g | Freq: Four times a day (QID) | INTRAVENOUS | Status: AC
  Administered 2013-04-27 (×2): 2 g via INTRAVENOUS
  Filled 2013-04-27 (×2): qty 50

## 2013-04-27 MED ORDER — METHOCARBAMOL 500 MG PO TABS: 500.0000 mg | ORAL_TABLET | Freq: Four times a day (QID) | ORAL | Status: DC | PRN

## 2013-04-27 MED ORDER — CEFAZOLIN SODIUM-DEXTROSE 2-3 GM-% IV SOLR
2.0000 g | INTRAVENOUS | Status: AC
  Administered 2013-04-27: 2 g via INTRAVENOUS

## 2013-04-27 MED ORDER — MIDAZOLAM HCL 5 MG/5ML IJ SOLN
INTRAMUSCULAR | Status: DC | PRN
  Administered 2013-04-27 (×2): 1 mg via INTRAVENOUS

## 2013-04-27 MED ORDER — CLONIDINE HCL 0.1 MG PO TABS
0.1000 mg | ORAL_TABLET | Freq: Two times a day (BID) | ORAL | Status: DC
  Administered 2013-04-27 – 2013-04-28 (×3): 0.1 mg via ORAL
  Filled 2013-04-27 (×5): qty 1

## 2013-04-27 MED ORDER — LACTATED RINGERS IV SOLN: INTRAVENOUS | Status: DC

## 2013-04-27 MED ORDER — ATENOLOL 25 MG PO TABS
25.0000 mg | ORAL_TABLET | Freq: Two times a day (BID) | ORAL | Status: DC
  Administered 2013-04-27 – 2013-04-28 (×3): 25 mg via ORAL
  Filled 2013-04-27 (×5): qty 1

## 2013-04-27 MED ORDER — TRANEXAMIC ACID 100 MG/ML IV SOLN
1000.0000 mg | Freq: Once | INTRAVENOUS | Status: AC
  Administered 2013-04-27: 1000 mg via INTRAVENOUS
  Filled 2013-04-27: qty 10

## 2013-04-27 MED ORDER — LEVETIRACETAM 250 MG PO TABS
250.0000 mg | ORAL_TABLET | Freq: Two times a day (BID) | ORAL | Status: DC
Start: 2013-04-27 — End: 2013-04-29
  Administered 2013-04-27 – 2013-04-28 (×3): 250 mg via ORAL
  Filled 2013-04-27 (×5): qty 1

## 2013-04-27 MED ORDER — ALUM & MAG HYDROXIDE-SIMETH 200-200-20 MG/5ML PO SUSP: 30.0000 mL | ORAL | Status: DC | PRN

## 2013-04-27 MED ORDER — ONDANSETRON HCL 4 MG PO TABS: 4.0000 mg | ORAL_TABLET | Freq: Four times a day (QID) | ORAL | Status: DC | PRN

## 2013-04-27 MED ORDER — HYDROMORPHONE HCL PF 1 MG/ML IJ SOLN: 0.5000 mg | INTRAMUSCULAR | Status: DC | PRN

## 2013-04-27 MED ORDER — METOCLOPRAMIDE HCL 10 MG PO TABS: 5.0000 mg | ORAL_TABLET | Freq: Three times a day (TID) | ORAL | Status: DC | PRN

## 2013-04-27 MED ORDER — CIPROFLOXACIN HCL 250 MG PO TABS
250.0000 mg | ORAL_TABLET | Freq: Two times a day (BID) | ORAL | Status: DC
  Administered 2013-04-27 – 2013-04-29 (×4): 250 mg via ORAL
  Filled 2013-04-27 (×6): qty 1

## 2013-04-27 MED ORDER — FENTANYL CITRATE 0.05 MG/ML IJ SOLN
INTRAMUSCULAR | Status: AC
  Filled 2013-04-27: qty 2

## 2013-04-27 MED ORDER — OXYBUTYNIN CHLORIDE 5 MG PO TABS
5.0000 mg | ORAL_TABLET | Freq: Every day | ORAL | Status: DC
  Administered 2013-04-27 – 2013-04-28 (×2): 5 mg via ORAL
  Filled 2013-04-27 (×3): qty 1

## 2013-04-27 MED ORDER — MIDAZOLAM HCL 2 MG/2ML IJ SOLN
INTRAMUSCULAR | Status: AC
  Filled 2013-04-27: qty 2

## 2013-04-27 MED ORDER — DEXAMETHASONE SODIUM PHOSPHATE 10 MG/ML IJ SOLN
INTRAMUSCULAR | Status: DC | PRN
  Administered 2013-04-27: 10 mg via INTRAVENOUS

## 2013-04-27 MED ORDER — DOCUSATE SODIUM 100 MG PO CAPS
100.0000 mg | ORAL_CAPSULE | Freq: Two times a day (BID) | ORAL | Status: DC
  Administered 2013-04-27 – 2013-04-28 (×4): 100 mg via ORAL

## 2013-04-27 MED ORDER — ONDANSETRON HCL 4 MG/2ML IJ SOLN
INTRAMUSCULAR | Status: DC | PRN
  Administered 2013-04-27: 4 mg via INTRAVENOUS

## 2013-04-27 MED ORDER — ENOXAPARIN SODIUM 30 MG/0.3ML ~~LOC~~ SOLN
30.0000 mg | SUBCUTANEOUS | Status: DC
  Administered 2013-04-28 – 2013-04-29 (×2): 30 mg via SUBCUTANEOUS
  Filled 2013-04-27 (×3): qty 0.3

## 2013-04-27 MED ORDER — METOCLOPRAMIDE HCL 5 MG/ML IJ SOLN: 5.0000 mg | Freq: Three times a day (TID) | INTRAMUSCULAR | Status: DC | PRN

## 2013-04-27 MED ORDER — 0.9 % SODIUM CHLORIDE (POUR BTL) OPTIME
TOPICAL | Status: DC | PRN
  Administered 2013-04-27: 1000 mL

## 2013-04-27 MED ORDER — PHENOL 1.4 % MT LIQD: 1.0000 | OROMUCOSAL | Status: DC | PRN

## 2013-04-27 MED ORDER — DEXAMETHASONE SODIUM PHOSPHATE 10 MG/ML IJ SOLN
10.0000 mg | Freq: Once | INTRAMUSCULAR | Status: AC
  Administered 2013-04-28: 10 mg via INTRAVENOUS
  Filled 2013-04-27: qty 1

## 2013-04-27 MED ORDER — CELECOXIB 200 MG PO CAPS
200.0000 mg | ORAL_CAPSULE | Freq: Two times a day (BID) | ORAL | Status: DC
  Administered 2013-04-27 – 2013-04-28 (×3): 200 mg via ORAL
  Filled 2013-04-27 (×4): qty 1

## 2013-04-27 MED ORDER — DIPHENHYDRAMINE HCL 25 MG PO CAPS: 25.0000 mg | ORAL_CAPSULE | Freq: Four times a day (QID) | ORAL | Status: DC | PRN

## 2013-04-27 MED ORDER — ACETAMINOPHEN 650 MG RE SUPP: 650.0000 mg | Freq: Four times a day (QID) | RECTAL | Status: DC | PRN

## 2013-04-27 MED ORDER — PROPOFOL 10 MG/ML IV BOLUS
INTRAVENOUS | Status: AC
  Filled 2013-04-27: qty 20

## 2013-04-27 MED ORDER — ATORVASTATIN CALCIUM 20 MG PO TABS
20.0000 mg | ORAL_TABLET | Freq: Every evening | ORAL | Status: DC
  Administered 2013-04-27 – 2013-04-28 (×2): 20 mg via ORAL
  Filled 2013-04-27 (×3): qty 1

## 2013-04-27 MED ORDER — PHENYTOIN SODIUM EXTENDED 100 MG PO CAPS
100.0000 mg | ORAL_CAPSULE | Freq: Two times a day (BID) | ORAL | Status: DC
  Administered 2013-04-27 – 2013-04-28 (×3): 100 mg via ORAL
  Filled 2013-04-27 (×5): qty 1

## 2013-04-27 MED ORDER — MIRTAZAPINE 15 MG PO TABS
15.0000 mg | ORAL_TABLET | Freq: Every day | ORAL | Status: DC
  Administered 2013-04-27 – 2013-04-28 (×2): 15 mg via ORAL
  Filled 2013-04-27 (×3): qty 1

## 2013-04-27 SURGICAL SUPPLY — 41 items
ADH SKN CLS APL DERMABOND .7 (GAUZE/BANDAGES/DRESSINGS) ×1
BAG SPEC THK2 15X12 ZIP CLS (MISCELLANEOUS)
BAG ZIPLOCK 12X15 (MISCELLANEOUS) IMPLANT
BLADE SAW SGTL 18X1.27X75 (BLADE) ×2 IMPLANT
BLADE SAW SGTL 18X1.27X75MM (BLADE) ×1
CAPT HIP PF MOP ×2 IMPLANT
DERMABOND ADVANCED (GAUZE/BANDAGES/DRESSINGS) ×2
DERMABOND ADVANCED .7 DNX12 (GAUZE/BANDAGES/DRESSINGS) ×1 IMPLANT
DRAPE C-ARM 42X120 X-RAY (DRAPES) ×3 IMPLANT
DRAPE STERI IOBAN 125X83 (DRAPES) ×3 IMPLANT
DRAPE U-SHAPE 47X51 STRL (DRAPES) ×9 IMPLANT
DRSG AQUACEL AG ADV 3.5X 6 (GAUZE/BANDAGES/DRESSINGS) ×2 IMPLANT
DRSG AQUACEL AG ADV 3.5X10 (GAUZE/BANDAGES/DRESSINGS) ×3 IMPLANT
DRSG TEGADERM 4X4.75 (GAUZE/BANDAGES/DRESSINGS) IMPLANT
DURAPREP 26ML APPLICATOR (WOUND CARE) ×3 IMPLANT
ELECT BLADE TIP CTD 4 INCH (ELECTRODE) ×3 IMPLANT
ELECT REM PT RETURN 9FT ADLT (ELECTROSURGICAL) ×3
ELECTRODE REM PT RTRN 9FT ADLT (ELECTROSURGICAL) ×1 IMPLANT
EVACUATOR 1/8 PVC DRAIN (DRAIN) IMPLANT
FACESHIELD LNG OPTICON STERILE (SAFETY) ×12 IMPLANT
GAUZE SPONGE 2X2 8PLY STRL LF (GAUZE/BANDAGES/DRESSINGS) IMPLANT
GLOVE BIOGEL PI IND STRL 7.5 (GLOVE) ×1 IMPLANT
GLOVE BIOGEL PI IND STRL 8 (GLOVE) ×1 IMPLANT
GLOVE BIOGEL PI INDICATOR 7.5 (GLOVE) ×2
GLOVE BIOGEL PI INDICATOR 8 (GLOVE) ×2
GLOVE ECLIPSE 8.0 STRL XLNG CF (GLOVE) ×3 IMPLANT
GLOVE ORTHO TXT STRL SZ7.5 (GLOVE) ×6 IMPLANT
GOWN SPEC L3 XXLG W/TWL (GOWN DISPOSABLE) ×3 IMPLANT
GOWN STRL REUS W/TWL LRG LVL3 (GOWN DISPOSABLE) ×3 IMPLANT
KIT BASIN OR (CUSTOM PROCEDURE TRAY) ×3 IMPLANT
PACK TOTAL JOINT (CUSTOM PROCEDURE TRAY) ×3 IMPLANT
PADDING CAST COTTON 6X4 STRL (CAST SUPPLIES) ×3 IMPLANT
SPONGE GAUZE 2X2 STER 10/PKG (GAUZE/BANDAGES/DRESSINGS)
SUT MNCRL AB 4-0 PS2 18 (SUTURE) ×3 IMPLANT
SUT VIC AB 1 CT1 36 (SUTURE) ×9 IMPLANT
SUT VIC AB 2-0 CT1 27 (SUTURE) ×6
SUT VIC AB 2-0 CT1 TAPERPNT 27 (SUTURE) ×2 IMPLANT
SUT VLOC 180 0 24IN GS25 (SUTURE) ×3 IMPLANT
TOWEL OR 17X26 10 PK STRL BLUE (TOWEL DISPOSABLE) ×3 IMPLANT
TOWEL OR NON WOVEN STRL DISP B (DISPOSABLE) IMPLANT
TRAY FOLEY CATH 14FRSI W/METER (CATHETERS) ×3 IMPLANT

## 2013-04-27 NOTE — Interval H&P Note (Signed)
History and Physical Interval Note:  04/27/2013 7:17 AM  [name]  has presented today for surgery, with the diagnosis of left hip osteoarthritis  The various methods of treatment have been discussed with the patient and family. After consideration of risks, benefits and other options for treatment, the patient has consented to  Procedure(s): LEFT TOTAL HIP ARTHROPLASTY ANTERIOR APPROACH (Left) as a surgical intervention .  The patient's history has been reviewed, patient examined, no change in status, stable for surgery.  I have reviewed the patient's chart and labs.  Questions were answered to the patient's satisfaction.     Shelda PalLIN,Charmayne Odell D

## 2013-04-27 NOTE — Anesthesia Postprocedure Evaluation (Signed)
Anesthesia Post Note  Patient: [name]  Procedure(s) Performed: Procedure(s) (LRB): LEFT TOTAL HIP ARTHROPLASTY ANTERIOR APPROACH (Left)  Anesthesia type: Spinal  Patient location: PACU  Post pain: Pain level controlled  Post assessment: Post-op Vital signs reviewed  Last Vitals: BP 163/59  Pulse 58  Temp(Src) 36.4 C (Oral)  Resp 14  SpO2 100%  Post vital signs: Reviewed  Level of consciousness: sedated  Complications: No apparent anesthesia complications

## 2013-04-27 NOTE — Progress Notes (Signed)
INITIAL NUTRITION ASSESSMENT  Pt meets criteria for severe MALNUTRITION in the context of chronic illness as evidenced by 12% weight loss in the past 3 months in addition to pt with visible severe muscle wasting and subcutaneous fat loss in upper body.  DOCUMENTATION CODES Per approved criteria  -Severe malnutrition in the context of chronic illness -Underweight   INTERVENTION: - Ensure Complete TID - Encouraged high calorie/protein diet to promote strength building - Recommend resuming vitamin B-12 tablet (was on 1,040mg tablet PTA) - Will continue to monitor   NUTRITION DIAGNOSIS: Increased nutrient needs related to underweight with left total hip arthroplasty as evidenced by BMI of 15 kg/(m^2), MD notes.    Goal: Pt to consume >90% of meals/supplements  Monitor:  Weights, labs, intake   Reason for Assessment: Malnutrition screening tool, consult for assessment   80y.o. female  Admitting Dx: S/P hip replacement  ASSESSMENT: Admitted for left total hip arthroplasty which was performed yesterday. Hx of cachexia, hyperlipidemia, ileus, diverticulosis, vitamin B12 deficiency anemia, and anemia of renal disease.   Met with pt who reports poor appetite for a long time. Eats 1-2 meals/day at home. Reports losing 14 pounds unintentionally in the past 1.5 months. Was not on any nutritional supplements at home. Denies any problems chewing or swallowing. Ate all of her jello and broth on clear liquid tray earlier today. Denies any nausea. Pt visibly cachetic in upper extremities. Pt on Megace and ferrous sulfate 3258mTID.   BUN/Cr elevated with low GFR Potassium elevated    Height: Ht Readings from Last 1 Encounters:  04/27/13 _0  (1.626 m)    Weight: Wt Readings from Last 1 Encounters:  04/27/13 92 lb (41.731 kg)    Ideal Body Weight: 120 lb   % Ideal Body Weight: 77%  Wt Readings from Last 10 Encounters:  04/27/13 92 lb (41.731 kg)  04/27/13 92 lb (41.731 kg)   04/23/13 92 lb (41.731 kg)  04/21/13 92 lb (41.731 kg)  04/16/13 92 lb (41.731 kg)  02/08/13 105 lb 12.8 oz (47.991 kg)  01/11/13 104 lb 9.6 oz (47.446 kg)  10/02/12 107 lb 4.8 oz (48.671 kg)  08/07/12 105 lb 5 oz (47.769 kg)  07/10/12 106 lb 3 oz (48.166 kg)    Usual Body Weight: 106 lb 1.5 months ago per pt   % Usual Body Weight: 87%  BMI:  Body mass index is 15.78 kg/(m^2). Underweight  Estimated Nutritional Needs: Kcal: 1300-1500 Protein: 65-80g Fluid: 1.3-1.5L/day   Skin: Left hip incision   Diet Order: General  EDUCATION NEEDS: -No education needs identified at this time   Intake/Output Summary (Last 24 hours) at 04/27/13 1608 Last data filed at 04/27/13 1417  Gross per 24 hour  Intake   2210 ml  Output    600 ml  Net   1610 ml    Last BM: 2/26  Labs:   Recent Labs Lab 04/21/13 1215  NA 138  K 5.5*  CL 105  CO2 19  BUN 41*  CREATININE 2.48*  CALCIUM 8.8  GLUCOSE 88    CBG (last 3)   Recent Labs  04/27/13 0640 04/27/13 1031 04/27/13 1300  GLUCAP 83 89 91    Scheduled Meds: . atenolol  25 mg Oral BID  . atorvastatin  20 mg Oral QPM  .  ceFAZolin (ANCEF) IV  2 g Intravenous Q6H  . celecoxib  200 mg Oral Q12H  . ciprofloxacin  250 mg Oral BID  . cloNIDine  0.1  mg Oral BID  . [START ON 04/28/2013] dexamethasone  10 mg Intravenous Once  . docusate sodium  100 mg Oral BID  . [START ON 04/28/2013] enoxaparin (LOVENOX) injection  30 mg Subcutaneous Q24H  . ferrous sulfate  325 mg Oral TID PC  . furosemide  20 mg Oral Daily  . levETIRAcetam  250 mg Oral BID  . megestrol  40 mg Oral BID  . mirtazapine  15 mg Oral QHS  . oxybutynin  5 mg Oral Daily  . phenytoin  100 mg Oral BID    Continuous Infusions: . sodium chloride 50 mL/hr at 04/27/13 1555    Past Medical History  Diagnosis Date  . Hypertension   . Anxiety and depression   . Chronic headaches   . Diverticular disease   . Anemia   . Arthritis     osteoarthritis  .  Osteoporosis   . Vitamin B 12 deficiency   . History of ITP   . Anxiety     takes xanax for sleep  . Diabetes mellitus     NIDDM x 12 years  . Heart murmur     benign;asymptomatic  . Chronic kidney disease     ESRD - AV fistula in LUE (Dr. Gershon Cull = nephrologist)  . Left knee pain 02/08/2013  . Cachexia 02/08/2013  . Hyperlipidemia   . Tremors of nervous system     Past Surgical History  Procedure Laterality Date  . Av fistula placement  11/15/10    left upper arm AVF  . Abdominal hysterectomy  1973  . Joint replacement  2007    rt hip  . Eye surgery    . Cataract extraction w/ intraocular lens  implant, bilateral  2002  . Vascular surgery  10-2010    left AVF  . Hernia repair  2000    ventral  . Ventral hernia repair  08/06/2011    Procedure: HERNIA REPAIR VENTRAL ADULT;  Surgeon: Zenovia Jarred, MD;  Location: [place];  Service: General;  Laterality: N/A;  Open Repair [place] with Mesh  . Cholecystectomy  yrs ago    Mikey College MS, [place], [place] Pager [phone] After Hours Pager

## 2013-04-27 NOTE — Anesthesia Procedure Notes (Signed)
Spinal  Patient location during procedure: OR End time: 04/27/2013 8:40 AM Staffing CRNA/Resident: Noralyn Pick Performed by: resident/CRNA  Preanesthetic Checklist Completed: patient identified, site marked, surgical consent, pre-op evaluation, timeout performed, IV checked, risks and benefits discussed and monitors and equipment checked Spinal Block Patient position: sitting Prep: Betadine Patient monitoring: heart rate, continuous pulse ox and blood pressure Approach: midline Location: L2-3 Injection technique: single-shot Needle Needle type: Sprotte  Needle gauge: 24 G Needle length: 9 cm Additional Notes Expiration date of kit checked and confirmed. Patient tolerated procedure well, without complications.

## 2013-04-27 NOTE — Op Note (Signed)
NAME:  [name]                ACCOUNT NO.: [id number]      MEDICAL RECORD NO.: [id number]      FACILITY:  [facility]      PHYSICIAN:  Durene Romans D  DATE OF BIRTH:  [birth year]     DATE OF PROCEDURE:  04/27/2013                                 OPERATIVE REPORT         PREOPERATIVE DIAGNOSIS: Left  hip osteoarthritis.      POSTOPERATIVE DIAGNOSIS:  Left hip osteoarthritis.      PROCEDURE:  Left total hip replacement through an anterior approach   utilizing DePuy THR system, component size 48mm pinnacle cup, a size 32+4 neutral   Altrex liner, a size 5 Hi Tri Lock stem with a 32+1 Articuleze metal ball.      SURGEON:  Madlyn Frankel. Charlann Boxer, M.D.      ASSISTANT:  Leilani Able, PA-C     ANESTHESIA:  Spinal.      SPECIMENS:  None.      COMPLICATIONS:  None.      BLOOD LOSS:  250 cc     DRAINS:  None.      INDICATION OF THE PROCEDURE:  [name] is a 80 y.o. female who had   presented to office for evaluation of left hip pain.  Radiographs revealed   progressive degenerative changes with bone-on-bone   articulation to the  hip joint.  The patient had painful limited range of   motion significantly affecting their overall quality of life.  The patient was failing to    respond to conservative measures, and at this point was ready   to proceed with more definitive measures.  The patient has noted progressive   degenerative changes in his hip, progressive problems and dysfunction   with regarding the hip prior to surgery.  Consent was obtained for   benefit of pain relief.  Specific risk of infection, DVT, component   failure, dislocation, need for revision surgery, as well discussion of   the anterior versus posterior approach were reviewed.  Consent was   obtained for benefit of anterior pain relief through an anterior   approach.      PROCEDURE IN DETAIL:  The patient was brought to operative theater.   Once adequate anesthesia, preoperative  antibiotics, 2gm Ancef plus 200mg  IV CIPRO administered.   The patient was positioned supine on the OSI Hanna table.  Once adequate   padding of boney process was carried out, we had predraped out the hip, and  used fluoroscopy to confirm orientation of the pelvis and position.      The left hip was then prepped and draped from proximal iliac crest to   mid thigh with shower curtain technique.      Time-out was performed identifying the patient, planned procedure, and   extremity.     An incision was then made 2 cm distal and lateral to the   anterior superior iliac spine extending over the orientation of the   tensor fascia lata muscle and sharp dissection was carried down to the   fascia of the muscle and protractor placed in the soft tissues.      The fascia was then incised.  The muscle belly was identified and  swept   laterally and retractor placed along the superior neck.  Following   cauterization of the circumflex vessels and removing some pericapsular   fat, a second cobra retractor was placed on the inferior neck.  A third   retractor was placed on the anterior acetabulum after elevating the   anterior rectus.  A L-capsulotomy was along the line of the   superior neck to the trochanteric fossa, then extended proximally and   distally.  Tag sutures were placed and the retractors were then placed   intracapsular.  We then identified the trochanteric fossa and   orientation of my neck cut, confirmed this radiographically   and then made a neck osteotomy with the femur on traction.  The femoral   head was removed without difficulty or complication.  Traction was let   off and retractors were placed posterior and anterior around the   acetabulum.      The labrum and foveal tissue were debrided.  I began reaming with a 45mm   reamer and reamed up to 47mm reamer with good bony bed preparation and a 48   cup was chosen.  The final 48mm Pinnacle cup was then impacted under  fluoroscopy  to confirm the depth of penetration and orientation with respect to   abduction.  The final   32+4 neutral Altrex liner was impacted with good visualized rim fit.  The cup was positioned anatomically within the acetabular portion of the pelvis.      At this point, the femur was rolled at 80 degrees.  Further capsule was   released off the inferior aspect of the femoral neck.  I then   released the superior capsule proximally.  The hook was placed laterally   along the femur and elevated manually and held in position with the bed   hook.  The leg was then extended and adducted with the leg rolled to 100   degrees of external rotation.  Once the proximal femur was fully   exposed, I used a box osteotome to set orientation.  I then began   broaching with the starting chili pepper broach and passed this by hand and then broached up to 5.  With the 5 broach in place I chose a high offset neck and did a trial reduction to match other hip that had been replaced in past.  The offset was appropriate, leg lengths   appeared to be equal, confirmed radiographically.   Given these findings, I went ahead and dislocated the hip, repositioned all   retractors and positioned the right hip in the extended and abducted position.  The final 5 hi Tri Lock stem was   chosen and it was impacted down to the level of neck cut.  Based on this   and the trial reduction, a 32+1 Articuleze metal ball was chosen and   impacted onto a clean and dry trunnion, and the hip was reduced.  The   hip had been irrigated throughout the case again at this point.  I did   reapproximate the superior capsular leaflet to the anterior leaflet   using #1 Vicryl.  The fascia of the   tensor fascia lata muscle was then reapproximated using #1 Vicryl and #0 V-lock sutures.  The   remaining wound was closed with 2-0 Vicryl and running 4-0 Monocryl.   The hip was cleaned, dried, and dressed sterilely using Dermabond and   Aquacel  dressing.  She was then brought  to recovery room in stable condition tolerating the procedure well.    Leilani AbleSteve Chabon, PA-C was present for the entirety of the case involved from   preoperative positioning, perioperative retractor management, general   facilitation of the case, as well as primary wound closure as assistant.            Madlyn FrankelMatthew D. Charlann Boxerlin, M.D.        04/27/2013 10:01 AM

## 2013-04-27 NOTE — Evaluation (Signed)
Physical Therapy Evaluation Patient Details Name: Dante GangHelen L Ijames MRN: [id number] DOB: 12/16/1932 Today's Date: 04/27/2013 Time: [id number] PT Time Calculation (min): 10 min  PT Assessment / Plan / Recommendation History of Present Illness  L DATHA  Clinical Impression  Pt  Tolerated  sitting and standing and taking a few steps. Pt was not so interested in walking today. Pt will benefit from PT to address listed problems.     PT Assessment  Patient needs continued PT services    Follow Up Recommendations  Home health PT    Does the patient have the potential to tolerate intense rehabilitation      Barriers to Discharge        Equipment Recommendations  None recommended by PT    Recommendations for Other Services     Frequency 7X/week    Precautions / Restrictions Precautions Precautions: Fall Restrictions Weight Bearing Restrictions: Yes LLE Weight Bearing: Partial weight bearing LLE Partial Weight Bearing Percentage or Pounds: under PT order.   Pertinent Vitals/Pain No pain      Mobility  Bed Mobility Overal bed mobility: Needs Assistance Bed Mobility: Supine to Sit;Sit to Supine Supine to sit: Mod assist Sit to supine: Mod assist General bed mobility comments: cues for technique, cues for task Transfers Overall transfer level: Needs assistance Equipment used: Rolling walker (2 wheeled) Transfers: Sit to/from Stand Sit to Stand: Min assist;+2 safety/equipment General transfer comment: cues for hand palcement. Ambulation/Gait Ambulation/Gait assistance: +2 safety/equipment;Min assist Ambulation Distance (Feet): 3 Feet (sidesteps.) Assistive device: Rolling walker (2 wheeled)    Exercises Total Joint Exercises Heel Slides: AAROM;Left;5 reps;Supine Hip ABduction/ADduction: AAROM;Left;10 reps;Supine   PT Diagnosis: Difficulty walking;Generalized weakness  PT Problem List: Decreased activity tolerance;Decreased knowledge of use of DME;Decreased  cognition;Decreased safety awareness;Decreased knowledge of precautions PT Treatment Interventions: DME instruction;Gait training;Functional mobility training;Therapeutic activities;Therapeutic exercise;Patient/family education     PT Goals(Current goals can be found in the care plan section) Acute Rehab PT Goals Patient Stated Goal: per daughter, to go home PT Goal Formulation: With patient/family Time For Goal Achievement: 04/30/13 Potential to Achieve Goals: Good  Visit Information  Last PT Received On: 04/27/13 Assistance Needed: +1 History of Present Illness: L DATHA       Prior Functioning  Home Living Family/patient expects to be discharged to:: Private residence Living Arrangements: Children Available Help at Discharge: Family;Available 24 hours/day Type of Home: House Home Equipment: Walker - 2 wheels;Wheelchair - [employer]manual;Bedside commode;Tub bench Additional Comments: daughter with pt 24/7 Prior Function Level of Independence: Needs assistance Gait / Transfers Assistance Needed: was transfer only after fall, pt reluctant to walk    Cognition  Cognition Arousal/Alertness: Awake/alert Behavior During Therapy: WFL for tasks assessed/performed Overall Cognitive Status: History of cognitive impairments - at baseline Area of Impairment: Problem solving Memory: Decreased short-term memory Problem Solving: Slow processing    Extremity/Trunk Assessment Upper Extremity Assessment Upper Extremity Assessment: Defer to OT evaluation Lower Extremity Assessment Lower Extremity Assessment: LLE deficits/detail LLE Deficits / Details: tolerated weight and hip flexion to 80   Balance    End of Session PT - End of Session Activity Tolerance: Patient tolerated treatment well Patient left: in bed;with call bell/phone within reach;with family/visitor present (RN to reset bad for bed alarm to work) Nurse Communication: Mobility status  GP     [name], Burnie Hank Elizabeth 04/27/2013, 5:09  PM

## 2013-04-27 NOTE — Progress Notes (Signed)
O.K. To go to floor with spinal level of L1- per Dr. Renold DonGermeroth

## 2013-04-27 NOTE — Telephone Encounter (Signed)
Dr Allyson SabalBerry reviewed the chart and cleared patient for left hip surgery.  Form was faxed back to [facility]

## 2013-04-27 NOTE — Plan of Care (Signed)
Problem: Consults Goal: Diagnosis- Total Joint Replacement Primary Total Hip     

## 2013-04-27 NOTE — Progress Notes (Signed)
X-ray results noted 

## 2013-04-27 NOTE — Transfer of Care (Signed)
Immediate Anesthesia Transfer of Care Note  Patient: [name]  Procedure(s) Performed: Procedure(s): LEFT TOTAL HIP ARTHROPLASTY ANTERIOR APPROACH (Left)  Patient Location: PACU  Anesthesia Type:Regional  Level of Consciousness: alert , oriented and sedated  Airway & Oxygen Therapy: Patient Spontanous Breathing and Patient connected to face mask oxygen  Post-op Assessment: Report given to PACU RN and Post -op Vital signs reviewed and stable  Post vital signs: Reviewed and stable  Complications: No apparent anesthesia complications

## 2013-04-27 NOTE — Progress Notes (Signed)
Portable AP Pelvis and Lateral Left Hip X-rays done. 

## 2013-04-27 NOTE — Progress Notes (Addendum)
Pt had ace wraps on both legs when coming up from pacu. Left leg has multiple bruises on it and a skin tear  By left ankle  Measuring 2 cm in width and 3 cm in height, no depth skin surface was pink.  pt also has another skin tear on lower left calf measuring 1cm width and 2 cm in heigth skin surface was nice and pink. R leg also has multiple bruises with healed scabs then I put tegederm over it. Patients Right shin is 0.25 cm width with 1 cm heigth, no depth and surface is nice pink. Sacral avellyn applied as patient has red sacrum, non blancing 1cm width by 2 cm in height.

## 2013-04-27 NOTE — Progress Notes (Signed)
Dr. Charlann Boxerlin informed of condition of patient's skin on lower legs. And treatment that was applied.

## 2013-04-28 DIAGNOSIS — E43 Unspecified severe protein-calorie malnutrition: Secondary | ICD-10-CM | POA: Insufficient documentation

## 2013-04-28 DIAGNOSIS — D62 Acute posthemorrhagic anemia: Secondary | ICD-10-CM | POA: Diagnosis not present

## 2013-04-28 DIAGNOSIS — R636 Underweight: Secondary | ICD-10-CM | POA: Diagnosis present

## 2013-04-28 LAB — BASIC METABOLIC PANEL
BUN: 53 mg/dL — ABNORMAL HIGH (ref 6–23)
BUN: 51 mg/dL — AB (ref 6–23)
CALCIUM: 8.6 mg/dL (ref 8.4–10.5)
CHLORIDE: 101 meq/L (ref 96–112)
CO2: 20 meq/L (ref 19–32)
CO2: 18 mEq/L — ABNORMAL LOW (ref 19–32)
CREATININE: 2.59 mg/dL — AB (ref 0.50–1.10)
Calcium: 8 mg/dL — ABNORMAL LOW (ref 8.4–10.5)
Chloride: 101 mEq/L (ref 96–112)
Creatinine, Ser: 2.8 mg/dL — ABNORMAL HIGH (ref 0.50–1.10)
GFR calc Af Amer: 18 mL/min — ABNORMAL LOW (ref >90)
GFR calc Af Amer: 19 mL/min — ABNORMAL LOW (ref >90)
GFR calc non Af Amer: 15 mL/min — ABNORMAL LOW (ref >90)
GFR calc non Af Amer: 17 mL/min — ABNORMAL LOW (ref >90)
GLUCOSE: 168 mg/dL — AB (ref 70–99)
Glucose, Bld: 118 mg/dL — ABNORMAL HIGH (ref 70–99)
POTASSIUM: 5.7 meq/L — AB (ref 3.7–5.3)
POTASSIUM: 5.4 meq/L — AB (ref 3.7–5.3)
SODIUM: 136 meq/L — AB (ref 137–147)
Sodium: 133 mEq/L — ABNORMAL LOW (ref 137–147)

## 2013-04-28 LAB — GLUCOSE, CAPILLARY
GLUCOSE-CAPILLARY: 93 mg/dL (ref 70–99)
GLUCOSE-CAPILLARY: 161 mg/dL — AB (ref 70–99)
Glucose-Capillary: 164 mg/dL — ABNORMAL HIGH (ref 70–99)
Glucose-Capillary: 152 mg/dL — ABNORMAL HIGH (ref 70–99)

## 2013-04-28 LAB — CBC
HCT: 24 % — ABNORMAL LOW (ref 36.0–46.0)
Hemoglobin: 7.8 g/dL — ABNORMAL LOW (ref 12.0–15.0)
MCH: 29 pg (ref 26.0–34.0)
MCHC: 32.5 g/dL (ref 30.0–36.0)
MCV: 89.2 fL (ref 78.0–100.0)
PLATELETS: 178 10*3/uL (ref 150–400)
RBC: 2.69 MIL/uL — AB (ref 3.87–5.11)
RDW: 13.9 % (ref 11.5–15.5)
WBC: 13.5 10*3/uL — AB (ref 4.0–10.5)

## 2013-04-28 LAB — PREPARE RBC (CROSSMATCH)

## 2013-04-28 NOTE — Progress Notes (Signed)
   Subjective: 1 Day Post-Op Procedure(s) (LRB): LEFT TOTAL HIP ARTHROPLASTY ANTERIOR APPROACH (Left)   Patient reports pain as mild, pain controlled. No events throughout the night. We discussed her blood levels and watching her potassium. Discussed giving her blood today and that we will watch her labs. Dr. Charlann Boxerlin will probably talk with her Nephrologist.   Objective:   VITALS:   Filed Vitals:   04/28/13  BP: 151/71  Pulse: 65  Temp: 98.2 F (36.8 C)   Resp: 18    Dorsiflexion/Plantar flexion intact Incision: dressing C/D/I No cellulitis present Compartment soft  LABS  Recent Labs  04/28/13 0435  HGB 7.8*  HCT 24.0*  WBC 13.5*  PLT 178     Recent Labs  04/28/13 0435  NA 136*  K 5.7*  BUN 53*  CREATININE 2.80*  GLUCOSE 118*     Assessment/Plan: 1 Day Post-Op Procedure(s) (LRB): LEFT TOTAL HIP ARTHROPLASTY ANTERIOR APPROACH (Left)   Advance diet Up with therapy D/C IV fluids Discharge home with home health eventually  ABLA  Will give 2 unit of blood today and watch labs tonight and tomorrow to follow her blood values.   Underweight (BMI <18.5)  Estimated body mass index is 15.78 kg/(m^2) as calculated from the following:   Height as of this encounter: 5\' 4"  (1.626 m).   Weight as of this encounter: 41.731 kg (92 lb). Patient also counseled that weight may inhibit the healing process Patient counseled on weight and future health issues including healing.  Hyperkalemia Will continue to follow labs at this point.     Anastasio AuerbachMatthew S. Imanie Darrow   PAC  04/28/2013, 9:57 AM

## 2013-04-28 NOTE — Progress Notes (Signed)
Physical Therapy Treatment Patient Details Name: Olivia GangHelen L Adkins MRN: [id number] DOB: 08/27/1932 Today's Date: 04/28/2013 Time: [id number] PT Time Calculation (min): 27 min  PT Assessment / Plan / Recommendation  History of Present Illness pt was admitted for L DA THA   PT Comments   Pt ambulated a short distance , Plans to DC  Home.   Follow Up Recommendations  Home health PT     Does the patient have the potential to tolerate intense rehabilitation     Barriers to Discharge        Equipment Recommendations  Rolling walker with 5" wheels    Recommendations for Other Services    Frequency 7X/week   Progress towards PT Goals Progress towards PT goals: Progressing toward goals  Plan Current plan remains appropriate    Precautions / Restrictions Precautions Precautions: Fall Restrictions Weight Bearing Restrictions: Yes LLE Weight Bearing: Partial weight bearing   Pertinent Vitals/Pain Pt states L hip is sore.    Mobility  Bed Mobility Overal bed mobility: Needs Assistance Sit to supine: Min assist General bed mobility comments: assist with LEs Transfers Overall transfer level: Needs assistance Equipment used: Rolling walker (2 wheeled) Transfers: Sit to/from Stand Sit to Stand: Min assist;+2 safety/equipment General transfer comment: cues for hand placement Ambulation/Gait Ambulation/Gait assistance: +2 safety/equipment;Mod assist Ambulation Distance (Feet): 15 Feet Assistive device: Rolling walker (2 wheeled) Gait Pattern/deviations: Step-to pattern;Antalgic;Trunk flexed General Gait Details: frequent cues for posture and step sequence.    Exercises Total Joint Exercises Ankle Circles/Pumps: AROM;Both;10 reps;Supine Short Arc Quad: AAROM;Left;10 reps;Supine Heel Slides: AAROM;Left;10 reps;Supine Hip ABduction/ADduction: AAROM;Left;10 reps;Supine   PT Diagnosis:    PT Problem List:   PT Treatment Interventions:     PT Goals (current goals can now be found in  the care plan section) Acute Rehab PT Goals Patient Stated Goal: per daughter, to go home  Visit Information  Last PT Received On: 04/28/13 Assistance Needed: +2 PT/OT/SLP Co-Evaluation/Treatment: Yes Reason for Co-Treatment: For patient/therapist safety PT goals addressed during session: Mobility/safety with mobility OT goals addressed during session: ADL's and self-care History of Present Illness: pt was admitted for L DA THA    Subjective Data  Patient Stated Goal: per daughter, to go home   Cognition  Cognition Arousal/Alertness: Awake/alert Behavior During Therapy: WFL for tasks assessed/performed Overall Cognitive Status: History of cognitive impairments - at baseline    Balance     End of Session PT - End of Session Activity Tolerance: Patient tolerated treatment well   GP     Olivia HayHill, [name] 04/28/2013, 3:39 PM

## 2013-04-28 NOTE — Evaluation (Signed)
Occupational Therapy Evaluation Patient Details Name: [name] MRN: [id number] DOB: 1932/08/07 Today's Date: 04/28/2013 Time: 0867-6195 OT Time Calculation (min): 20 min  OT Assessment / Plan / Recommendation History of present illness pt was admitted for L DA THA   Clinical Impression   Pt was admitted for the above surgery.  She will benefit from skilled OT to increase safety with adls and bathroom transfers and to educate family to cue her for safety during these tasks.     OT Assessment  Patient needs continued OT Services    Follow Up Recommendations  Home health OT    Barriers to Discharge      Equipment Recommendations  None recommended by OT (has 3;1/tub bench)    Recommendations for Other Services    Frequency  Min 2X/week    Precautions / Restrictions Precautions Precautions: Fall Restrictions Weight Bearing Restrictions: Yes LLE Weight Bearing: Partial weight bearing   Pertinent Vitals/Pain No c/o pain    ADL  Grooming: Wash/dry face;Brushing hair;Set up Where Assessed - Grooming: Unsupported sitting Upper Body Bathing: Set up Where Assessed - Upper Body Bathing: Unsupported sitting Lower Body Bathing: Moderate assistance Where Assessed - Lower Body Bathing: Supported sit to stand Upper Body Dressing: Set up Where Assessed - Upper Body Dressing: Unsupported sitting Lower Body Dressing: Maximal assistance Where Assessed - Lower Body Dressing: Supported sit to stand Toilet Transfer: Simulated;+2 Total assistance;Minimal assistance Toilet Transfer Method: Sit to stand Toileting - Clothing Manipulation and Hygiene: Maximal assistance Where Assessed - Glass blower/designer Manipulation and Hygiene: Sit to stand from 3-in-1 or toilet Transfers/Ambulation Related to ADLs: pt is PWB--ambulated around bed with +2 for safety, min A ADL Comments: Nephew present and daughter came at end of session.  She helps with adls as needed    OT Diagnosis: Generalized weakness   OT Problem List: Decreased strength;Decreased activity tolerance;Decreased cognition;Impaired balance (sitting and/or standing);Decreased safety awareness;Decreased knowledge of use of DME or AE;Decreased knowledge of precautions OT Treatment Interventions: Self-care/ADL training;DME and/or AE instruction;Cognitive remediation/compensation;Patient/family education;Balance training   OT Goals(Current goals can be found in the care plan section) Acute Rehab OT Goals Patient Stated Goal: per daughter, to go home OT Goal Formulation: With patient/family Time For Goal Achievement: 05/05/13 Potential to Achieve Goals: Good ADL Goals Pt Will Perform Grooming: with min guard assist;standing Pt Will Transfer to Toilet: with min assist;stand pivot transfer;bedside commode Pt Will Perform Toileting - Clothing Manipulation and hygiene: with min assist;sit to/from stand Additional ADL Goal #1: family will independently cue pt for safe transfers and safe sit to stand for adls   Visit Information  Last OT Received On: 04/28/13 PT/OT/SLP Co-Evaluation/Treatment: Yes Reason for Co-Treatment: For patient/therapist safety PT goals addressed during session: Mobility/safety with mobility OT goals addressed during session: ADL's and self-care Reason Eval/Treat Not Completed: Medical issues which prohibited therapy History of Present Illness: pt was admitted for L DA THA       Prior Functioning     Home Living Family/patient expects to be discharged to:: Private residence Living Arrangements: Children Home Equipment: Dan Humphreys - 2 wheels;Wheelchair - [employer];Tub bench Additional Comments: pt has been sponge bathing since fall Prior Function Level of Independence: Needs assistance Comments: pt asks for help with adls as needed; daughter assists Communication Communication: No difficulties         Vision/Perception     Cognition  Cognition Arousal/Alertness:  Awake/alert Behavior During Therapy: WFL for tasks assessed/performed Overall Cognitive Status: History of cognitive impairments - at  baseline    Extremity/Trunk Assessment Upper Extremity Assessment Upper Extremity Assessment: Overall WFL for tasks assessed     Mobility Bed Mobility Sit to supine: Min assist General bed mobility comments: assist with LEs Transfers Transfers: Sit to/from Stand Sit to Stand: Min assist;+2 safety/equipment General transfer comment: cues for hand placement     Exercise     Balance     End of Session OT - End of Session Activity Tolerance: Patient tolerated treatment well Patient left: in bed;with call bell/phone within reach;with family/visitor present  GO     [name] 04/28/2013, 2:33 PM Olivia OtterMaryellen Elira Adkins, OTR/L [phone] 04/28/2013

## 2013-04-28 NOTE — Progress Notes (Signed)
OT Cancellation Note  Patient Details Name: Dante GangHelen L Weipert MRN: [id number] DOB: 08/31/1932   Cancelled Treatment:    Reason Eval/Treat Not Completed: Medical issues which prohibited therapy  Reason Eval/Treat Not Completed: Medical issues which prohibited therapy. Pt getting blood. Will recheck on pt later in day or next day. Daughter not present when OT Checked on pt.     Alba CoryREDDING, Odessa Morren D 04/28/2013, 10:48 AM

## 2013-04-28 NOTE — Progress Notes (Signed)
   CARE MANAGEMENT NOTE 04/28/2013  Patient:  [name],[name]   Account Number:  [id number]  Date Initiated:  04/28/2013  Documentation initiated by:  [facility]Adonica Fukushima  Subjective/Objective Assessment:   LEFT TOTAL HIP ARTHROPLASTY ANTERIOR APPROACH     [facility]   Anticipated DC Date:  04/29/2013   Anticipated DC Plan:  HOME W HOME HEALTH SERVICES      DC Planning Services  CM consult      [facility]   Choice offered to / List presented to:  C-1 Patient        HH arranged  HH-2 PT      [facility]   Status of service:  Completed, signed off Medicare Important Message given?   (If response is "NO", the following Medicare IM given date fields will be blank) Date Medicare IM given:   Date Additional Medicare IM given:    Discharge Disposition:  HOME W HOME HEALTH SERVICES  Per UR Regulation:    If discussed at Long Length of Stay Meetings, dates discussed:    Comments:  04/28/2013 1230 NCM spoke to pt and [facility] Pt states she has RW and 3n1 at home. Requesting another wheelchair. States her w/c at home is 78 years old. Message left for MD for wheelchair order. Olivia DonningAlesia Rivaldo Hineman RN CCM Case Mgmt phone [phone]

## 2013-04-29 DIAGNOSIS — N390 Urinary tract infection, site not specified: Secondary | ICD-10-CM

## 2013-04-29 LAB — URINE CULTURE: Colony Count: >=100000

## 2013-04-29 LAB — TYPE AND SCREEN
ABO/RH(D): O POS
ANTIBODY SCREEN: NEGATIVE
Unit division: 0
Unit division: 0

## 2013-04-29 LAB — BASIC METABOLIC PANEL
BUN: 50 mg/dL — AB (ref 6–23)
CALCIUM: 8 mg/dL — AB (ref 8.4–10.5)
CO2: 18 meq/L — AB (ref 19–32)
Chloride: 106 mEq/L (ref 96–112)
Creatinine, Ser: 2.42 mg/dL — ABNORMAL HIGH (ref 0.50–1.10)
GFR calc Af Amer: 21 mL/min — ABNORMAL LOW (ref >90)
GFR calc non Af Amer: 18 mL/min — ABNORMAL LOW (ref >90)
GLUCOSE: 97 mg/dL (ref 70–99)
Potassium: 5.2 mEq/L (ref 3.7–5.3)
SODIUM: 136 meq/L — AB (ref 137–147)

## 2013-04-29 LAB — CBC
HCT: 26.9 % — ABNORMAL LOW (ref 36.0–46.0)
HEMOGLOBIN: 9.1 g/dL — AB (ref 12.0–15.0)
MCH: 29.4 pg (ref 26.0–34.0)
MCHC: 33.8 g/dL (ref 30.0–36.0)
MCV: 86.8 fL (ref 78.0–100.0)
Platelets: 120 10*3/uL — ABNORMAL LOW (ref 150–400)
RBC: 3.1 MIL/uL — AB (ref 3.87–5.11)
RDW: 14.7 % (ref 11.5–15.5)
WBC: 8.8 10*3/uL (ref 4.0–10.5)

## 2013-04-29 MED ORDER — HYDROCODONE-ACETAMINOPHEN 5-325 MG PO TABS: 1.0000 | ORAL_TABLET | ORAL | Status: DC | PRN

## 2013-04-29 MED ORDER — POLYETHYLENE GLYCOL 3350 17 G PO PACK: 17.0000 g | PACK | Freq: Every day | ORAL | Status: DC | PRN

## 2013-04-29 MED ORDER — DSS 100 MG PO CAPS: 100.0000 mg | ORAL_CAPSULE | Freq: Two times a day (BID) | ORAL | Status: DC

## 2013-04-29 MED ORDER — TIZANIDINE HCL 4 MG PO TABS: 4.0000 mg | ORAL_TABLET | Freq: Four times a day (QID) | ORAL | Status: AC | PRN

## 2013-04-29 MED ORDER — ENSURE COMPLETE PO LIQD: 237.0000 mL | Freq: Three times a day (TID) | ORAL | Status: DC

## 2013-04-29 MED ORDER — FERROUS SULFATE 325 (65 FE) MG PO TABS: 325.0000 mg | ORAL_TABLET | Freq: Three times a day (TID) | ORAL | Status: DC

## 2013-04-29 MED ORDER — ENOXAPARIN SODIUM 30 MG/0.3ML ~~LOC~~ SOLN: 30.0000 mg | SUBCUTANEOUS | Status: DC

## 2013-04-29 MED ORDER — CIPROFLOXACIN HCL 250 MG PO TABS: 250.0000 mg | ORAL_TABLET | Freq: Two times a day (BID) | ORAL | Status: DC

## 2013-04-29 NOTE — Progress Notes (Signed)
Physical Therapy Treatment Patient Details Name: [name] MRN: [id number] DOB: 12/08/1932 Today's Date: 04/29/2013 Time: [id number] PT Time Calculation (min): 27 min  PT Assessment / Plan / Recommendation  History of Present Illness pt was admitted for L DA THA   PT Comments   Pt tolerated ambulation well, reviewed with daughter and pt that pt is PWB. Pt is to DC today.  Follow Up Recommendations  Home health PT     Does the patient have the potential to tolerate intense rehabilitation     Barriers to Discharge        Equipment Recommendations  Rolling walker with 5" wheels    Recommendations for Other Services    Frequency 7X/week   Progress towards PT Goals Progress towards PT goals: Progressing toward goals  Plan Current plan remains appropriate    Precautions / Restrictions Precautions Precautions: Fall Restrictions LLE Weight Bearing: Partial weight bearing LLE Partial Weight Bearing Percentage or Pounds: per Matt PA    Pertinent Vitals/Pain States L hip is sore.    Mobility  Bed Mobility Overal bed mobility: Needs Assistance Supine to sit: Min guard General bed mobility comments: cues for safety, pt in bed with lags hanging over. Transfers Equipment used: Rolling walker (2 wheeled) Transfers: Sit to/from Stand Sit to Stand: Min assist General transfer comment: cues for hand placement, lifting support  Ambulation/Gait Ambulation/Gait assistance: Min assist Ambulation Distance (Feet): 15 Feet (x2) Assistive device: Rolling walker (2 wheeled) Gait Pattern/deviations: Narrow base of support General Gait Details: decreased control of Legs, tends to have a narrow base. decreased  control of legs.    Exercises     PT Diagnosis:    PT Problem List:   PT Treatment Interventions:     PT Goals (current goals can now be found in the care plan section)    Visit Information  Last PT Received On: 04/29/13 Assistance Needed: +1 History of Present Illness: pt  was admitted for L DA THA    Subjective Data      Cognition  Cognition Arousal/Alertness: Awake/alert Memory: Decreased short-term memory Problem Solving: Slow processing    Balance  Balance Overall balance assessment: Needs assistance Standing balance support: Bilateral upper extremity supported;During functional activity Standing balance-Leahy Scale: Fair  End of Session PT - End of Session Equipment Utilized During Treatment: Gait belt Activity Tolerance: Patient tolerated treatment well Patient left: in chair;with call bell/phone within reach;with family/visitor present (chair alarm in chair but not set due to daughter here to dress pt.) Nurse Communication: Mobility status   GP     [name], [name] 04/29/2013, 9:35 AM

## 2013-04-29 NOTE — Progress Notes (Signed)
OT Cancellation Note  Patient Details Name: Dante GangHelen L Allocca MRN: [id number] DOB: 03/12/1932   Cancelled Treatment:    Reason Eval/Treat Not Completed: Other (comment) checked in with pt and daughter and they report just getting up to 3in1 with PT and feel comfortable with toilet transfers. Daughter states she also feels comfortable with how to assist with LB dressing/ADL. Pt supposed to d/c today.  Lennox LaityStone, [place] Sara Stafford [phone] 04/29/2013, 11:09 AM

## 2013-04-29 NOTE — Progress Notes (Signed)
[facility]   [facility] the [facility]  If patient discharges after hours, please call [phone].   Renard HamperLecretia Adkins 04/29/2013, 9:39 AM

## 2013-04-29 NOTE — Progress Notes (Addendum)
   Subjective: 2 Days Post-Op Procedure(s) (LRB): LEFT TOTAL HIP ARTHROPLASTY ANTERIOR APPROACH (Left)   Patient reports pain as mild, pain controlled. No events throughout the night. Ready to be discharged home.   Objective:   VITALS:   Filed Vitals:   04/29/13 0457  BP: 130/62  Pulse: 74  Temp: 98.7 F (37.1 C)  Resp: 16    Neurovascular intact Dorsiflexion/Plantar flexion intact Incision: dressing C/D/I No cellulitis present Compartment soft  LABS  Recent Labs  04/28/13 0435 04/29/13 0515  HGB 7.8* 9.1*  HCT 24.0* 26.9*  WBC 13.5* 8.8  PLT 178 120*     Recent Labs  04/28/13 0435 04/28/13 2018 04/29/13 0515  NA 136* 133* 136*  K 5.7* 5.4* 5.2  BUN 53* 51* 50*  CREATININE 2.80* 2.59* 2.42*  GLUCOSE 118* 168* 97     Assessment/Plan: 2 Days Post-Op Procedure(s) (LRB): LEFT TOTAL HIP ARTHROPLASTY ANTERIOR APPROACH (Left) Discussed following up with for nephrologist in a couple of days. Up with therapy Discharge home with home health [facility] Follow up with OLIN,Money Mckeithan D in 2 weeks.  Contact information:  [facility] [address] [place] [id number] [phone]    ABLA  Gave 2 unit of blood yesterday, H&H increase today. Will treat with iron and observe.   Underweight (BMI <18.5)  Estimated body mass index is 15.78 kg/(m^2) as calculated from the following:  Height as of this encounter: 5\' 4"  (1.626 m).  Weight as of this encounter: 41.731 kg (92 lb).  Patient also counseled that weight may inhibit the healing process  Patient counseled on weight and future health issues including healing.   Hyperkalemia  Down a little after receiving blood.  UTI Put on Cipro, will continue for 10 days.    Anastasio AuerbachMatthew S. Baileigh Modisette   PAC  04/29/2013, 7:45 AM

## 2013-04-29 NOTE — Progress Notes (Signed)
Pt stable, scripts, d/c instructions given with no questions/concerns voiced by pt or daughter.  Demonstrated for Pt and daughter  How to administer a lovenox injection.  Pt transported via wheelchair to private vehicle by NT and daughter.

## 2013-04-30 ENCOUNTER — Emergency Department (HOSPITAL_COMMUNITY): Payer: Medicare Other

## 2013-04-30 ENCOUNTER — Encounter (HOSPITAL_COMMUNITY): Payer: Self-pay | Admitting: Emergency Medicine

## 2013-04-30 ENCOUNTER — Emergency Department (HOSPITAL_COMMUNITY)
Admission: EM | Admit: 2013-04-30 | Discharge: 2013-04-30 | Disposition: A | Discharge status: Home / Self Care | Payer: Medicare Other | Attending: Emergency Medicine | Admitting: Emergency Medicine

## 2013-04-30 DIAGNOSIS — N186 End stage renal disease: Secondary | ICD-10-CM | POA: Insufficient documentation

## 2013-04-30 DIAGNOSIS — D649 Anemia, unspecified: Secondary | ICD-10-CM | POA: Insufficient documentation

## 2013-04-30 DIAGNOSIS — F411 Generalized anxiety disorder: Secondary | ICD-10-CM | POA: Insufficient documentation

## 2013-04-30 DIAGNOSIS — I120 Hypertensive chronic kidney disease with stage 5 chronic kidney disease or end stage renal disease: Secondary | ICD-10-CM | POA: Insufficient documentation

## 2013-04-30 DIAGNOSIS — M1990 Unspecified osteoarthritis, unspecified site: Secondary | ICD-10-CM | POA: Insufficient documentation

## 2013-04-30 DIAGNOSIS — G8929 Other chronic pain: Secondary | ICD-10-CM | POA: Insufficient documentation

## 2013-04-30 DIAGNOSIS — Y939 Activity, unspecified: Secondary | ICD-10-CM | POA: Insufficient documentation

## 2013-04-30 DIAGNOSIS — T40601A Poisoning by unspecified narcotics, accidental (unintentional), initial encounter: Secondary | ICD-10-CM | POA: Insufficient documentation

## 2013-04-30 DIAGNOSIS — F3289 Other specified depressive episodes: Secondary | ICD-10-CM | POA: Insufficient documentation

## 2013-04-30 DIAGNOSIS — Z992 Dependence on renal dialysis: Secondary | ICD-10-CM | POA: Insufficient documentation

## 2013-04-30 DIAGNOSIS — Z79899 Other long term (current) drug therapy: Secondary | ICD-10-CM | POA: Insufficient documentation

## 2013-04-30 DIAGNOSIS — Z96649 Presence of unspecified artificial hip joint: Secondary | ICD-10-CM | POA: Insufficient documentation

## 2013-04-30 DIAGNOSIS — Z792 Long term (current) use of antibiotics: Secondary | ICD-10-CM | POA: Insufficient documentation

## 2013-04-30 DIAGNOSIS — T48201A Poisoning by unspecified drugs acting on muscles, accidental (unintentional), initial encounter: Secondary | ICD-10-CM | POA: Insufficient documentation

## 2013-04-30 DIAGNOSIS — Z8719 Personal history of other diseases of the digestive system: Secondary | ICD-10-CM | POA: Insufficient documentation

## 2013-04-30 DIAGNOSIS — T50901A Poisoning by unspecified drugs, medicaments and biological substances, accidental (unintentional), initial encounter: Secondary | ICD-10-CM

## 2013-04-30 DIAGNOSIS — E785 Hyperlipidemia, unspecified: Secondary | ICD-10-CM | POA: Insufficient documentation

## 2013-04-30 DIAGNOSIS — T48204A Poisoning by unspecified drugs acting on muscles, undetermined, initial encounter: Secondary | ICD-10-CM | POA: Insufficient documentation

## 2013-04-30 DIAGNOSIS — T400X1A Poisoning by opium, accidental (unintentional), initial encounter: Secondary | ICD-10-CM | POA: Insufficient documentation

## 2013-04-30 DIAGNOSIS — Y929 Unspecified place or not applicable: Secondary | ICD-10-CM | POA: Insufficient documentation

## 2013-04-30 DIAGNOSIS — E119 Type 2 diabetes mellitus without complications: Secondary | ICD-10-CM | POA: Insufficient documentation

## 2013-04-30 DIAGNOSIS — R011 Cardiac murmur, unspecified: Secondary | ICD-10-CM | POA: Insufficient documentation

## 2013-04-30 DIAGNOSIS — F329 Major depressive disorder, single episode, unspecified: Secondary | ICD-10-CM | POA: Insufficient documentation

## 2013-04-30 LAB — URINALYSIS, ROUTINE W REFLEX MICROSCOPIC
BILIRUBIN URINE: NEGATIVE
Glucose, UA: NEGATIVE mg/dL
Ketones, ur: NEGATIVE mg/dL
Leukocytes, UA: NEGATIVE
NITRITE: NEGATIVE
PH: 6 (ref 5.0–8.0)
Protein, ur: NEGATIVE mg/dL
SPECIFIC GRAVITY, URINE: 1.013 (ref 1.005–1.030)
Urobilinogen, UA: 0.2 mg/dL (ref 0.0–1.0)

## 2013-04-30 LAB — COMPREHENSIVE METABOLIC PANEL
ALBUMIN: 2.6 g/dL — AB (ref 3.5–5.2)
ALK PHOS: 64 U/L (ref 39–117)
AST: 12 U/L (ref 0–37)
BUN: 49 mg/dL — ABNORMAL HIGH (ref 6–23)
CO2: 20 mEq/L (ref 19–32)
Calcium: 8.4 mg/dL (ref 8.4–10.5)
Chloride: 105 mEq/L (ref 96–112)
Creatinine, Ser: 2.18 mg/dL — ABNORMAL HIGH (ref 0.50–1.10)
GFR calc Af Amer: 24 mL/min — ABNORMAL LOW (ref >90)
GFR calc non Af Amer: 20 mL/min — ABNORMAL LOW (ref >90)
GLUCOSE: 81 mg/dL (ref 70–99)
POTASSIUM: 4.9 meq/L (ref 3.7–5.3)
SODIUM: 140 meq/L (ref 137–147)
TOTAL PROTEIN: 5.7 g/dL — AB (ref 6.0–8.3)
Total Bilirubin: 0.5 mg/dL (ref 0.3–1.2)

## 2013-04-30 LAB — URINE MICROSCOPIC-ADD ON

## 2013-04-30 LAB — CBC WITH DIFFERENTIAL/PLATELET
BASOS ABS: 0 10*3/uL (ref 0.0–0.1)
BASOS PCT: 1 % (ref 0–1)
EOS PCT: 3 % (ref 0–5)
Eosinophils Absolute: 0.2 10*3/uL (ref 0.0–0.7)
HCT: 35.2 % — ABNORMAL LOW (ref 36.0–46.0)
Hemoglobin: 11.8 g/dL — ABNORMAL LOW (ref 12.0–15.0)
Lymphocytes Relative: 19 % (ref 12–46)
Lymphs Abs: 1.2 10*3/uL (ref 0.7–4.0)
MCH: 29.6 pg (ref 26.0–34.0)
MCHC: 33.5 g/dL (ref 30.0–36.0)
MCV: 88.4 fL (ref 78.0–100.0)
Monocytes Absolute: 0.7 10*3/uL (ref 0.1–1.0)
Monocytes Relative: 11 % (ref 3–12)
NEUTROS PCT: 67 % (ref 43–77)
Neutro Abs: 4.4 10*3/uL (ref 1.7–7.7)
Platelets: 170 10*3/uL (ref 150–400)
RBC: 3.98 MIL/uL (ref 3.87–5.11)
RDW: 14.7 % (ref 11.5–15.5)
WBC: 6.6 10*3/uL (ref 4.0–10.5)

## 2013-04-30 LAB — I-STAT CG4 LACTIC ACID, ED: Lactic Acid, Venous: 0.85 mmol/L (ref 0.5–2.2)

## 2013-04-30 LAB — PRO B NATRIURETIC PEPTIDE: Pro B Natriuretic peptide (BNP): 10767 pg/mL — ABNORMAL HIGH (ref 0–450)

## 2013-04-30 MED ORDER — HYDROCODONE-ACETAMINOPHEN 7.5-325 MG/15ML PO SOLN: 5.0000 mL | Freq: Four times a day (QID) | ORAL | Status: DC | PRN

## 2013-04-30 NOTE — ED Notes (Signed)
[facility] EMS was called out for unresponsive. Upon EMS arrival, pt was slumped over in a chair. Pt was given hydrocodone and a muscle relaxer at 1700 for a recent Left hip replacement surgery. Pt's daughter came home to check on her mother, and found patient not responsive.  MEDS given PTA:  Narcan 1 mg IVP at 1810

## 2013-04-30 NOTE — Discharge Instructions (Signed)
Accidental Overdose °A drug overdose occurs when a chemical substance (drug or medication) is used in amounts large enough to overcome a person. This may result in severe illness or death. This is a type of poisoning. Accidental overdoses of medications or other substances come from a variety of reasons. When this happens accidentally, it is often because the person taking the substance does not know enough about what they have taken. Drugs which commonly cause overdose deaths are alcohol, psychotropic medications (medications which affect the mind), pain medications, illegal drugs (street drugs) such as cocaine and heroin, and multiple drugs taken at the same time. It may result from careless behavior (such as over-indulging at a party). Other causes of overdose may include multiple drug use, a lapse in memory, or drug use after a period of no drug use.  °Sometimes overdosing occurs because a person cannot remember if they have taken their medication.  °A common unintentional overdose in young children involves multi-vitamins containing iron. Iron is a part of the hemoglobin molecule in blood. It is used to transport oxygen to living cells. When taken in small amounts, iron allows the body to restock hemoglobin. In large amounts, it causes problems in the body. If this overdose is not treated, it can lead to death. °Never take medicines that show signs of tampering or do not seem quite right. Never take medicines in the dark or in poor lighting. Read the label and check each dose of medicine before you take it. When adults are poisoned, it happens most often through carelessness or lack of information. Taking medicines in the dark or taking medicine prescribed for someone else to treat the same type of problem is a dangerous practice. °SYMPTOMS  °Symptoms of overdose depend on the medication and amount taken. They can vary from over-activity with stimulant over-dosage, to sleepiness from depressants such as  alcohol, narcotics and tranquilizers. Confusion, dizziness, nausea and vomiting may be present. If problems are severe enough coma and death may result. °DIAGNOSIS  °Diagnosis and management are generally straightforward if the drug is known. Otherwise it is more difficult. At times, certain symptoms and signs exhibited by the patient, or blood tests, can reveal the drug in question.  °TREATMENT  °In an emergency department, most patients can be treated with supportive measures. Antidotes may be available if there has been an overdose of opioids or benzodiazepines. A rapid improvement will often occur if this is the cause of overdose. °At home or away from medical care: °· There may be no immediate problems or warning signs in children. °· Not everything works well in all cases of poisoning. °· Take immediate action. Poisons may act quickly. °· If you think someone has swallowed medicine or a household product, and the person is unconscious, having seizures (convulsions), or is not breathing, immediately call for an ambulance. °IF a person is conscious and appears to be doing OK but has swallowed a poison: °· Do not wait to see what effect the poison will have. Immediately call a poison control center (listed in the white pages of your telephone book under "Poison Control" or inside the front cover with other emergency numbers). Some poison control centers have TTY capability for the deaf. Check with your local center if you or someone in your family requires this service. °· Keep the container so you can read the label on the product for ingredients. °· Describe what, when, and how much was taken and the age and condition of the person poisoned.   Inform them if the person is vomiting, choking, drowsy, shows a change in color or temperature of skin, is conscious or unconscious, or is convulsing.  Do not cause vomiting unless instructed by medical personnel. Do not induce vomiting or force liquids into a person who  is convulsing, unconscious, or very drowsy. Stay calm and in control.   Activated charcoal also is sometimes used in certain types of poisoning and you may wish to add a supply to your emergency medicines. It is available without a prescription. Call a poison control center before using this medication. PREVENTION  Thousands of children die every year from unintentional poisoning. This may be from household chemicals, poisoning from carbon monoxide in a car, taking their parent's medications, or simply taking a few iron pills or vitamins with iron. Poisoning comes from unexpected sources.  Store medicines out of the sight and reach of children, preferably in a locked cabinet. Do not keep medications in a food cabinet. Always store your medicines in a secure place. Get rid of expired medications.  If you have children living with you or have them as occasional guests, you should have child-resistant caps on your medicine containers. Keep everything out of reach. Child proof your home.  If you are called to the telephone or to answer the door while you are taking a medicine, take the container with you or put the medicine out of the reach of small children.  Do not take your medication in front of children. Do not tell your child how good a medication is and how good it is for them. They may get the idea it is more of a treat.  If you are an adult and have accidentally taken an overdose, you need to consider how this happened and what can be done to prevent it from happening again. If this was from a street drug or alcohol, determine if there is a problem that needs addressing. If you are not sure a problems exists, it is easy to talk to a professional and ask them if they think you have a problem. It is better to handle this problem in this way before it happens again and has a much worse consequence. Document Released: 04/27/2004 Document Revised: 05/06/2011 Document Reviewed: 10/03/2008 [facility]  Patient Information 2014 [employer] [place]. Please cut the Zanaflex, in half so that she is getting 2 mg at a time, you've also been given a prescription for Lortab elixir to be given in small amounts.  For pain control. Feel the combination of the 4 mg Zanaflex, and 5 mg Vicodin, was too much for her symptoms and caused the altered mental status

## 2013-04-30 NOTE — ED Provider Notes (Signed)
CSN: [id number]     Arrival date & time 04/30/13  1836 History   First MD Initiated Contact with Patient 04/30/13 1846     Chief Complaint  Patient presents with  . Altered Mental Status     (Consider location/radiation/quality/duration/timing/severity/associated sxs/prior Treatment) Patient is a 80 y.o. female presenting with altered mental status. The history is provided by the EMS personnel and a relative.  Altered Mental Status Presenting symptoms: behavior changes and unresponsiveness   Severity:  Severe Most recent episode:  Today Episode history:  Single Duration:  2 hours Timing:  Constant Progression:  Resolved Chronicity:  New Context: recent change in medication   Context comment:  Pt recently had hip replacement and d/ced ysterday from hospital.  daughter gave hydrocodone and muscle relaxor and when checked on her unresponsive Associated symptoms: no abdominal pain, no decreased appetite, no difficulty breathing, no fever, no headaches, no nausea, no vomiting and no weakness   Associated symptoms comment:  When EMS arrived pt was slouched over in a chair and unresponsive with decreased resp effort.  Pt was sating 83% on RA.  Pt placed on NRB and given narcan which siginficantly improved sx.   Past Medical History  Diagnosis Date  . Hypertension   . Anxiety and depression   . Chronic headaches   . Diverticular disease   . Anemia   . Arthritis     osteoarthritis  . Osteoporosis   . Vitamin B 12 deficiency   . History of ITP   . Anxiety     takes xanax for sleep  . Diabetes mellitus     NIDDM x 12 years  . Heart murmur     benign;asymptomatic  . Chronic kidney disease     ESRD - AV fistula in LUE (Dr. Carlean Jewsynrthia Dunham = nephrologist)  . Left knee pain 02/08/2013  . Cachexia 02/08/2013  . Hyperlipidemia   . Tremors of nervous system    Past Surgical History  Procedure Laterality Date  . Av fistula placement  11/15/10    left upper arm AVF  . Abdominal  hysterectomy  1973  . Joint replacement  2007    rt hip  . Eye surgery    . Cataract extraction w/ intraocular lens  implant, bilateral  2002  . Vascular surgery  10-2010    left AVF  . Hernia repair  2000    ventral  . Ventral hernia repair  08/06/2011    Procedure: HERNIA REPAIR VENTRAL ADULT;  Surgeon: Liz MaladyBurke E Thompson, MD;  [facility];  Service: General;  Laterality: N/A;  Open Repair [employer] Hernia with Mesh  . Cholecystectomy  yrs ago  . Total hip arthroplasty Left 04/27/2013    Procedure: LEFT TOTAL HIP ARTHROPLASTY ANTERIOR APPROACH;  Surgeon: Shelda PalMatthew D Olin, MD;  Location: [facility] ORS;  Service: Orthopedics;  Laterality: Left;   Family History  Problem Relation Age of Onset  . Stroke Mother   . Heart disease Father    History  Substance Use Topics  . Smoking status: Never Smoker   . Smokeless tobacco: Current User    Types: Snuff  . Alcohol Use: No   OB History   Grav Para Term Preterm Abortions TAB SAB Ect Mult Living                 Review of Systems  Constitutional: Negative for fever and decreased appetite.  Respiratory: Negative for chest tightness and shortness of breath.   Gastrointestinal: Negative for nausea,  vomiting and abdominal pain.  Neurological: Negative for weakness and headaches.  All other systems reviewed and are negative.      Allergies  Review of patient's allergies indicates no known allergies.  Home Medications   Current Outpatient Rx  Name  Route  Sig  Dispense  Refill  . atenolol (TENORMIN) 25 MG tablet   Oral   Take 25 mg by mouth 2 (two) times daily.          Marland Kitchen atorvastatin (LIPITOR) 20 MG tablet   Oral   Take 20 mg by mouth every evening. daily         . Cholecalciferol (VITAMIN D-3) 1000 UNITS CAPS   Oral   Take 1,000 Units by mouth daily.         . ciprofloxacin (CIPRO) 250 MG tablet   Oral   Take 1 tablet (250 mg total) by mouth 2 (two) times daily.   20 tablet   0   . cloNIDine (CATAPRES) 0.1 MG  tablet   Oral   Take 0.1 mg by mouth 2 (two) times daily.          . darbepoetin (ARANESP) 150 MCG/0.3ML SOLN   Subcutaneous   Inject 150 mcg into the skin every 30 (thirty) days. As needed. Only receives if blood test is low         . docusate sodium 100 MG CAPS   Oral   Take 100 mg by mouth 2 (two) times daily.   10 capsule   0   . enoxaparin (LOVENOX) 30 MG/0.3ML injection   Subcutaneous   Inject 0.3 mLs (30 mg total) into the skin daily.   12 Syringe   0   . feeding supplement, ENSURE COMPLETE, (ENSURE COMPLETE) LIQD   Oral   Take 237 mLs by mouth 3 (three) times daily between meals.         . ferrous sulfate 325 (65 FE) MG tablet   Oral   Take 1 tablet (325 mg total) by mouth 3 (three) times daily after meals.      3   . furosemide (LASIX) 20 MG tablet   Oral   Take 20 mg by mouth daily.          Marland Kitchen HYDROcodone-acetaminophen (NORCO/VICODIN) 5-325 MG per tablet   Oral   Take 1-2 tablets by mouth every 4 (four) hours as needed for moderate pain.   80 tablet   0   . KIONEX 15 GM/60ML suspension               . levETIRAcetam (KEPPRA) 250 MG tablet   Oral   Take 250 mg by mouth 2 (two) times daily.         Marland Kitchen levETIRAcetam (KEPPRA) 250 MG tablet      TAKE 1 TABLET BY MOUTH TWICE A DAY   30 tablet   0     PATIENT NEEDS TO BE SEEN   . megestrol (MEGACE) 40 MG tablet      TAKE 1 TABLET (40 MG TOTAL) BY MOUTH 2 (TWO) TIMES DAILY.   60 tablet   1   . mirtazapine (REMERON) 15 MG tablet   Oral   Take 15 mg by mouth at bedtime.         Marland Kitchen oxybutynin (DITROPAN) 5 MG tablet   Oral   Take 5 mg by mouth daily.          . phenytoin (DILANTIN) 100 MG ER capsule   Oral  Take 100 mg by mouth 2 (two) times daily.         . polyethylene glycol (MIRALAX / GLYCOLAX) packet   Oral   Take 17 g by mouth daily as needed for mild constipation.   14 each   0   . tiZANidine (ZANAFLEX) 4 MG tablet   Oral   Take 1 tablet (4 mg total) by mouth every  6 (six) hours as needed for muscle spasms.   30 tablet   0   . vitamin B-12 (CYANOCOBALAMIN) 1000 MCG tablet   Oral   Take 1 tablet (1,000 mcg total) by mouth daily.   30 tablet   6    BP 168/53  Temp(Src) 98.7 F (37.1 C) (Oral)  Resp 12  SpO2 100% Physical Exam  Nursing note and vitals reviewed. Constitutional: She appears well-developed and well-nourished. No distress.  HENT:  Head: Normocephalic and atraumatic.  Mouth/Throat: Oropharynx is clear and moist. Mucous membranes are dry.  Dry cracked lips  Eyes: Conjunctivae and EOM are normal. Pupils are equal, round, and reactive to light.  Neck: Normal range of motion. Neck supple.  Cardiovascular: Normal rate, regular rhythm and intact distal pulses.   Murmur heard. Pulmonary/Chest: Effort normal and breath sounds normal. No respiratory distress. She has no wheezes. She has no rales.  Abdominal: Soft. She exhibits no distension. There is no tenderness. There is no rebound and no guarding.  Well healed midline abd scar  Musculoskeletal: Normal range of motion. She exhibits no edema and no tenderness.  Palpable thrill over maturing graft in the left upper arm.  Surgical bandage over the left hip is intact without signs of swelling or erythema  Neurological: [name] alert.  Oriented to person and place  Skin: Skin is warm and dry. No rash noted. No erythema. There is pallor.  Psychiatric:  confused    ED Course  Procedures (including critical care time) Labs Review Labs Reviewed  CBC WITH DIFFERENTIAL - Abnormal; Notable for the following:    Hemoglobin 11.8 (*)    HCT 35.2 (*)    All other components within normal limits  COMPREHENSIVE METABOLIC PANEL - Abnormal; Notable for the following:    BUN 49 (*)    Creatinine, Ser 2.18 (*)    Total Protein 5.7 (*)    Albumin 2.6 (*)    GFR calc non Af Amer 20 (*)    GFR calc Af Amer 24 (*)    All other components within normal limits  PRO B NATRIURETIC PEPTIDE - Abnormal;  Notable for the following:    Pro B Natriuretic peptide (BNP) 10767.0 (*)    All other components within normal limits  URINALYSIS, ROUTINE W REFLEX MICROSCOPIC - Abnormal; Notable for the following:    Hgb urine dipstick TRACE (*)    All other components within normal limits  URINE MICROSCOPIC-ADD ON  I-STAT CG4 LACTIC ACID, ED   Imaging Review Dg Chest 2 View  04/30/2013   CLINICAL DATA:  Altered mental status.  EXAM: CHEST  2 VIEW  COMPARISON:  Chest x-ray 03/04/2013.  FINDINGS: Lung volumes are normal. No consolidative airspace disease. No pleural effusions. No pneumothorax. No pulmonary nodule or mass noted. Pulmonary vasculature and the cardiomediastinal silhouette are within normal limits. Atherosclerosis in the thoracic aorta.  IMPRESSION: 1.  No radiographic evidence of acute cardiopulmonary disease. 2. Atherosclerosis.   Electronically Signed   By: Trudie Reed M.D.   On: 04/30/2013 19:35     EKG Interpretation  None      MDM   Final diagnoses:  Accidental drug overdose   Patient presents via EMS due to altered mental status, unresponsiveness and hypoxia.  Patient recently had a hip replacement and was discharged from the hospital yesterday. She went home with home health services. At 5 PM patient was given hydrocodone and a muscle relaxer and one daughter came to check on her mother slumped over in the chair unresponsive and not breathing well. When EMS arrived she had an oxygen saturation of 83%. She was placed on a nonrebreather and given Narcan. Here patient is awake and alert but confused. [name] displaying symptoms of delirium talking about being at her mother's house and being run over the the ambulance. This could be from recent surgery versus medications. [name] moving all extremities without any focal symptoms concerning for stroke. She denies any abdominal pain chest pain or shortness of breath. She does appear dehydrated. During her hospital stay she did receive 2  units of blood after her hip replacement do to a hemoglobin drop from 9.8-7. The patient does have a history of chronic kidney disease and has a maturing fistula in her left upper arm but denies ever having dialysis. Her last creatinine was 2.8 prior to discharge.  7:23 PM Spoke with pt's daughter who is caring for her and she states that she will normally get confused and talks about going to her mothers house and daughter feels she is back to baseline.  Most likely this was medication related.  CBC, CMP, BNP, troponin, UA, lactate, chest x-ray, EKG pending.   Pt checked out to [facility] at 2000.  Gwyneth Sprout, MD 05/02/13 [phone]

## 2013-04-30 NOTE — ED Notes (Signed)
Pt unable to urinate at this time. Fracture bedpan placed at bedside. Pt and family encouraged to call RN when patient has to urinate.

## 2013-04-30 NOTE — ED Notes (Signed)
Patient transported to X-ray 

## 2013-04-30 NOTE — ED Provider Notes (Signed)
Since labs have been reviewed.  They're all within normal limits per the patient's family.  [name] back at her baseline.  I think she probably overdosed on the hydrocodone, and muscle relaxer.  I will send her home with a prescription for liquid, Lortab that can be given in smaller amounts and recommend that the Zanaflex be cut in half to 2 mg tablet.  Arman FilterGail K Elmor Kost, NP 04/30/13 2140

## 2013-05-02 NOTE — ED Provider Notes (Signed)
Medical screening examination/treatment/procedure(s) were conducted as a shared visit with non-physician practitioner(s) and myself.  I personally evaluated the patient during the encounter.   EKG Interpretation None        Blue Winther, MD 05/02/13 0736 

## 2013-05-02 NOTE — Discharge Summary (Signed)
Physician Discharge Summary  Patient ID: [name] MRN: [id number] DOB/AGE: Jul 19, 1932 80 y.o.  Admit date: 04/27/2013 Discharge date: 04/29/2013   Procedures:  Procedure(s) (LRB): LEFT TOTAL HIP ARTHROPLASTY ANTERIOR APPROACH (Left)  Attending Physician:  Dr. Durene Romans   Admission Diagnoses:   Left hip OA / pain  Discharge Diagnoses:  Principal Problem:   S/P left THA, AA Active Problems:   Hyperkalemia   Protein-calorie malnutrition, severe   Acute blood loss anemia   Underweight   UTI (urinary tract infection)  Past Medical History  Diagnosis Date  . Hypertension   . Anxiety and depression   . Chronic headaches   . Diverticular disease   . Anemia   . Arthritis     osteoarthritis  . Osteoporosis   . Vitamin B 12 deficiency   . History of ITP   . Anxiety     takes xanax for sleep  . Diabetes mellitus     NIDDM x 12 years  . Heart murmur     benign;asymptomatic  . Chronic kidney disease     ESRD - AV fistula in LUE (Dr. Carlean Jews = nephrologist)  . Left knee pain 02/08/2013  . Cachexia 02/08/2013  . Hyperlipidemia   . Tremors of nervous system     HPI:    [name], 80 y.o. female, has a history of pain and functional disability in the left hip(s) due to arthritis and patient has failed non-surgical conservative treatments for greater than 12 weeks to include NSAID's and/or analgesics, use of assistive devices and activity modification. Onset of symptoms was gradual starting 1+ years ago with gradually worsening course since that time.The patient noted prior procedures of the hip to include arthroplasty on the right hip(s). Patient currently rates pain in the left hip at 8 out of 10 with activity. Patient has worsening of pain with activity and weight bearing, trendelenberg gait, pain that interfers with activities of daily living and pain with passive range of motion. Patient has evidence of periarticular osteophytes and joint space narrowing by  imaging studies. This condition presents safety issues increasing the risk of falls. There is no current active infection. Risks, benefits and expectations were discussed with the patient. Risks including but not limited to the risk of anesthesia, blood clots, nerve damage, blood vessel damage, failure of the prosthesis, infection and up to and including death. Patient understand the risks, benefits and expectations and wishes to proceed with surgery.  PCP: Delorse Lek, MD   Discharged Condition: good  Hospital Course:  Patient underwent the above stated procedure on 04/27/2013. Patient tolerated the procedure well and brought to the recovery room in good condition and subsequently to the floor.  POD #1 BP: 151/71 ; Pulse: 65 ; Temp: 98.2 F (36.8 C) ; Resp: 18  Patient reports pain as mild, pain controlled. No events throughout the night. We discussed her blood levels and watching her potassium. Discussed giving her blood today and that we will watch her labs. Neurovascular intact, dorsiflexion/plantar flexion intact, incision: dressing C/D/I, no cellulitis present and compartment soft.   LABS  Basename    HGB  7.8  HCT  24.0   POD #2  BP: 130/62 ; Pulse: 74 ; Temp: 98.7 F (37.1 C) ; Resp: 16  Patient reports pain as mild, pain controlled. No events throughout the night. Better after receiving blood.  Labs are still looking good.Ready to be discharged home.  Neurovascular intact, dorsiflexion/plantar flexion intact, incision: dressing C/D/I,  no cellulitis present and compartment soft.   LABS  Basename    HGB  9.1  HCT  26.9    Discharge Exam: General appearance: alert, cooperative and no distress Extremities: Homans sign is negative, no sign of DVT, no edema, redness or tenderness in the calves or thighs and no ulcers, gangrene or trophic changes  Disposition:     Home or Self Care with follow up in 2 weeks   Follow-up Information   Follow [facility] with [facility]. [facility](Home  Health Physical Therapy)    Contact information:   [address] [place] [address] [phone]       Follow up with Shelda PalLIN,Idona Stach D, MD. Schedule an appointment as soon as possible for a visit in 2 weeks.   Specialty:  Orthopedic Surgery   Contact information:   [address] [place] [address] [phone]       Follow up with DUNHAM,CYNTHIA B, MD. (Call in the next couple days to check on renal function and labs)    Specialty:  Nephrology   Contact information:   [address] [facility] [place] [address] [phone]       Discharge Orders   Future Appointments Provider Department Dept Phone   05/03/2013 8:30 AM [facility] [phone]   05/03/2013 9:00 AM Artis DelayNi Gorsuch, MD [facility] [phone]   05/03/2013 9:15 AM [facility] [phone]   Future Orders Complete By Expires   Call MD / Call 911  As directed    Comments:     If you experience chest pain or shortness of breath, CALL 911 and be transported to the hospital emergency room.  If you develope a fever above 101 F, pus (white drainage) or increased drainage or redness at the wound, or calf pain, call your surgeon's office.   Change dressing  As directed    Comments:     Maintain surgical dressing for 10-14 days, or until follow up in the clinic.   Constipation Prevention  As directed    Comments:     Drink plenty of fluids.  Prune juice may be helpful.  You may use a stool softener, such as Colace (over the counter) 100 mg twice a day.  Use MiraLax (over the counter) for constipation as needed.   Diet - low sodium heart healthy  As directed    Discharge instructions  As directed    Comments:     Maintain surgical dressing for 10-14 days, or until follow up in the clinic. [facility]. Call with  any questions or concerns.   Partial weight bearing  As directed    Questions:     % Body Weight:  50   Laterality:  left   Extremity:  Lower   TED hose  As directed    Comments:     Use stockings (TED hose) for 2 weeks on both leg(s).  You may remove them at night for sleeping.        Medication List    STOP taking these medications       acetaminophen 500 MG tablet  Commonly known as:  TYLENOL      TAKE these medications       atorvastatin 20 MG tablet  Commonly known as:  LIPITOR  Take 20 mg by mouth at bedtime. daily  cloNIDine 0.1 MG tablet  Commonly known as:  CATAPRES  Take 0.2 mg by mouth 2 (two) times daily.     enoxaparin 30 MG/0.3ML injection  Commonly known as:  LOVENOX  Inject 0.3 mLs (30 mg total) into the skin daily.     furosemide 20 MG tablet  Commonly known as:  LASIX  Take 20 mg by mouth daily.     levETIRAcetam 250 MG tablet  Commonly known as:  KEPPRA  Take 250 mg by mouth 2 (two) times daily.     tiZANidine 4 MG tablet  Commonly known as:  ZANAFLEX  Take 1 tablet (4 mg total) by mouth every 6 (six) hours as needed for muscle spasms.     vitamin B-12 1000 MCG tablet  Commonly known as:  CYANOCOBALAMIN  Take 1 tablet (1,000 mcg total) by mouth daily.     Vitamin D-3 1000 UNITS Caps  Take 1,000 Units by mouth daily.         Signed: Anastasio Auerbach. Barrett Goldie   PAC  05/02/2013, 3:09 PM

## 2013-05-03 ENCOUNTER — Ambulatory Visit (HOSPITAL_BASED_OUTPATIENT_CLINIC_OR_DEPARTMENT_OTHER): Payer: Medicare Other | Admitting: Hematology and Oncology

## 2013-05-03 ENCOUNTER — Encounter (INDEPENDENT_AMBULATORY_CARE_PROVIDER_SITE_OTHER): Payer: Self-pay

## 2013-05-03 ENCOUNTER — Ambulatory Visit: Payer: Medicare Other

## 2013-05-03 ENCOUNTER — Encounter: Payer: Self-pay | Admitting: Hematology and Oncology

## 2013-05-03 ENCOUNTER — Other Ambulatory Visit (HOSPITAL_BASED_OUTPATIENT_CLINIC_OR_DEPARTMENT_OTHER): Payer: Medicare Other

## 2013-05-03 VITALS — BP 130/43 | HR 77 | Temp 97.8°F | Resp 18 | Ht 64.0 in | Wt 104.4 lb

## 2013-05-03 DIAGNOSIS — D631 Anemia in chronic kidney disease: Secondary | ICD-10-CM

## 2013-05-03 DIAGNOSIS — N039 Chronic nephritic syndrome with unspecified morphologic changes: Secondary | ICD-10-CM

## 2013-05-03 DIAGNOSIS — R630 Anorexia: Secondary | ICD-10-CM

## 2013-05-03 DIAGNOSIS — N189 Chronic kidney disease, unspecified: Secondary | ICD-10-CM

## 2013-05-03 DIAGNOSIS — D519 Vitamin B12 deficiency anemia, unspecified: Secondary | ICD-10-CM

## 2013-05-03 DIAGNOSIS — E538 Deficiency of other specified B group vitamins: Secondary | ICD-10-CM

## 2013-05-03 LAB — CBC WITH DIFFERENTIAL/PLATELET
BASO%: 0.8 % (ref 0.0–2.0)
Basophils Absolute: 0 10*3/uL (ref 0.0–0.1)
EOS%: 7.1 % — AB (ref 0.0–7.0)
Eosinophils Absolute: 0.4 10*3/uL (ref 0.0–0.5)
HEMATOCRIT: 30.8 % — AB (ref 34.8–46.6)
HGB: 10.1 g/dL — ABNORMAL LOW (ref 11.6–15.9)
LYMPH%: 25.4 % (ref 14.0–49.7)
MCH: 29.6 pg (ref 25.1–34.0)
MCHC: 32.8 g/dL (ref 31.5–36.0)
MCV: 90.2 fL (ref 79.5–101.0)
MONO#: 0.6 10*3/uL (ref 0.1–0.9)
MONO%: 10.5 % (ref 0.0–14.0)
NEUT#: 3.4 10*3/uL (ref 1.5–6.5)
NEUT%: 56.2 % (ref 38.4–76.8)
PLATELETS: 178 10*3/uL (ref 145–400)
RBC: 3.41 10*6/uL — ABNORMAL LOW (ref 3.70–5.45)
RDW: 14.5 % (ref 11.2–14.5)
WBC: 6.1 10*3/uL (ref 3.9–10.3)
lymph#: 1.6 10*3/uL (ref 0.9–3.3)

## 2013-05-03 LAB — COMPREHENSIVE METABOLIC PANEL (CC13)
ALK PHOS: 56 U/L (ref 40–150)
ANION GAP: 7 meq/L (ref 3–11)
AST: 10 U/L (ref 5–34)
Albumin: 2.4 g/dL — ABNORMAL LOW (ref 3.5–5.0)
BILIRUBIN TOTAL: 0.34 mg/dL (ref 0.20–1.20)
BUN: 36.1 mg/dL — ABNORMAL HIGH (ref 7.0–26.0)
CO2: 20 mEq/L — ABNORMAL LOW (ref 22–29)
CREATININE: 1.8 mg/dL — AB (ref 0.6–1.1)
Calcium: 8.4 mg/dL (ref 8.4–10.4)
Chloride: 111 mEq/L — ABNORMAL HIGH (ref 98–109)
Glucose: 94 mg/dl (ref 70–140)
Potassium: 4.7 mEq/L (ref 3.5–5.1)
Sodium: 138 mEq/L (ref 136–145)
Total Protein: 5.3 g/dL — ABNORMAL LOW (ref 6.4–8.3)

## 2013-05-03 NOTE — Progress Notes (Signed)
[facility] OFFICE PROGRESS NOTE  BURNETT,[name], MD DIAGNOSIS:  Anemia chronic disease from chronic kidney disease and history of B12 deficiency  SUMMARY OF HEMATOLOGIC HISTORY: This is a pleasant 80 year old lady was found to have anemia. Previously she was found to have B12 deficiency and had been receiving B12 injection monthly. For the anemia chronic disease she was getting Aranesp. She was in a mild thrombocytopenia which resolved with low-dose prednisone.  INTERVAL HISTORY: [name] 80 y.o. female returns for her line recently, she was found to have pelvic insufficiency fracture. She underwent hip replacement therapy requiring 2 units of blood transfusion. The patient is undergoing rehabilitation. She no longer has hip pain. Her appetite is stable and she has gained some weight. [name] on DVT prophylaxis and denies any recent bleeding. For her anemia, should have no symptoms. Denies any chest pain, shortness of breath or dizziness. She complained of mild constipation. I have reviewed the past medical history, past surgical history, social history and family history with the patient and they are unchanged from previous note.  ALLERGIES:  has No Known Allergies.  MEDICATIONS:  Current Outpatient Prescriptions  Medication Sig Dispense Refill  . atenolol (TENORMIN) 50 MG tablet Take 50 mg by mouth 2 (two) times daily.      Marland Kitchen atorvastatin (LIPITOR) 20 MG tablet Take 20 mg by mouth at bedtime. daily      . Cholecalciferol (VITAMIN D-3) 1000 UNITS CAPS Take 1,000 Units by mouth daily.      . ciprofloxacin (CIPRO) 250 MG tablet Take 250 mg by mouth 2 (two) times daily. 10 day course started 04/29/13      . cloNIDine (CATAPRES) 0.1 MG tablet Take 0.2 mg by mouth 2 (two) times daily.       Marland Kitchen docusate sodium (COLACE) 100 MG capsule Take 100 mg by mouth daily as needed for mild constipation.      . enoxaparin (LOVENOX) 30 MG/0.3ML injection Inject 0.3 mLs (30 mg total)  into the skin daily.  12 Syringe  0  . furosemide (LASIX) 20 MG tablet Take 20 mg by mouth daily.       Marland Kitchen HYDROcodone-acetaminophen (HYCET) 7.5-325 mg/15 ml solution Take 5 mLs by mouth every 6 (six) hours as needed for moderate pain.  120 mL  0  . HYDROcodone-acetaminophen (NORCO/VICODIN) 5-325 MG per tablet Take 2 tablets by mouth every 6 (six) hours as needed for moderate pain.      Marland Kitchen levETIRAcetam (KEPPRA) 250 MG tablet Take 250 mg by mouth 2 (two) times daily.      . megestrol (MEGACE) 40 MG tablet Take 40 mg by mouth 2 (two) times daily.      Marland Kitchen oxybutynin (DITROPAN-XL) 5 MG 24 hr tablet Take 5 mg by mouth daily.      Marland Kitchen tiZANidine (ZANAFLEX) 4 MG tablet Take 1 tablet (4 mg total) by mouth every 6 (six) hours as needed for muscle spasms.  30 tablet  0  . vitamin B-12 (CYANOCOBALAMIN) 1000 MCG tablet Take 1 tablet (1,000 mcg total) by mouth daily.  30 tablet  6   No current facility-administered medications for this visit.     REVIEW OF SYSTEMS:   Constitutional: Denies fevers, chills or night sweats Eyes: Denies blurriness of vision Ears, nose, mouth, throat, and face: Denies mucositis or sore throat Respiratory: Denies cough, dyspnea or wheezes Cardiovascular: Denies palpitation, chest discomfort or lower extremity swelling Gastrointestinal:  Denies nausea, heartburn or change in bowel habits  Skin: Denies abnormal skin rashes Lymphatics: Denies new lymphadenopathy or easy bruising Neurological:Denies numbness, tingling or new weaknesses Behavioral/Psych: Mood is stable, no new changes  All other systems were reviewed with the patient and are negative.  PHYSICAL EXAMINATION: ECOG PERFORMANCE STATUS: 2 - Symptomatic, <50% confined to bed  Filed Vitals:   05/03/13 0853  BP: 130/43  Pulse: 77  Temp: 97.8 F (36.6 C)  Resp: 18   Filed Weights   05/03/13 0853  Weight: 104 lb 6.4 oz (47.356 kg)    GENERAL:alert, no distress and comfortable. She looks thin and mildly  cachectic Musculoskeletal:no cyanosis of digits and no clubbing  NEURO: alert & oriented x 3 with fluent speech, no focal motor/sensory deficits  LABORATORY DATA:  I have reviewed the data as listed Results for orders placed in visit on 05/03/13 (from the past 48 hour(s))  CBC WITH DIFFERENTIAL     Status: Abnormal   Collection Time    05/03/13  8:38 AM      Result Value Ref Range   WBC 6.1  3.9 - 10.3 10e3/uL   NEUT# 3.4  1.5 - 6.5 10e3/uL   HGB 10.1 (*) 11.6 - 15.9 g/dL   HCT 14.730.8 (*) 82.934.8 - 56.246.6 %   Platelets 178  145 - 400 10e3/uL   MCV 90.2  79.5 - 101.0 fL   MCH 29.6  25.1 - 34.0 pg   MCHC 32.8  31.5 - 36.0 g/dL   RBC 1.303.41 (*) 8.653.70 - 7.845.45 10e6/uL   RDW 14.5  11.2 - 14.5 %   lymph# 1.6  0.9 - 3.3 10e3/uL   MONO# 0.6  0.1 - 0.9 10e3/uL   Eosinophils Absolute 0.4  0.0 - 0.5 10e3/uL   Basophils Absolute 0.0  0.0 - 0.1 10e3/uL   NEUT% 56.2  38.4 - 76.8 %   LYMPH% 25.4  14.0 - 49.7 %   MONO% 10.5  0.0 - 14.0 %   EOS% 7.1 (*) 0.0 - 7.0 %   BASO% 0.8  0.0 - 2.0 %    Lab Results  Component Value Date   WBC 6.1 05/03/2013   HGB 10.1* 05/03/2013   HCT 30.8* 05/03/2013   MCV 90.2 05/03/2013   PLT 178 05/03/2013  ASSESSMENT & PLAN:  #1 B12 deficiency Her most recent B12 is adequately replaced. I recommend oral B12 supplements. She agreed. #2 anemia chronic disease She responded well to Aranesp injection recently. The goal is to keep her hemoglobin above 10 g. She does not need an injection today. I scheduled her to come back in 2 months to have her blood rechecked #3 chewing snuff The patient is not interested to quit. #4 anorexia, improving [name] on low-dose Megace. We can discontinue that in the future if she continued to gain weight. #5 chronic kidney disease Her creatinine is stable. Continue to follow her primary care provider. All questions were answered. The patient knows to call the clinic with any problems, questions or concerns. No barriers to learning was detected.  I  spent 15 minutes counseling the patient face to face. The total time spent in the appointment was 20 minutes and more than 50% was on counseling.     Bradlee Bridgers, MD 05/03/2013 9:29 AM

## 2013-05-05 ENCOUNTER — Telehealth: Payer: Self-pay | Admitting: Hematology and Oncology

## 2013-05-05 NOTE — Telephone Encounter (Signed)
, °

## 2013-05-07 ENCOUNTER — Other Ambulatory Visit: Payer: Self-pay | Admitting: Neurology

## 2013-05-07 NOTE — Telephone Encounter (Signed)
Patient has not been seen since 2013.  I called patient, got no answer.  Left message asking that she call back to schedule an appt.

## 2013-06-21 ENCOUNTER — Ambulatory Visit: Payer: Medicare Other | Admitting: Hematology and Oncology

## 2013-06-23 IMAGING — CR DG CHEST 1V PORT
1 series · 1 of 1 positions shown · non-contrast
Comparison: 08/19/2011

CLINICAL DATA: CHF, intubated

PORTABLE CHEST - 1 VIEW

[AP]
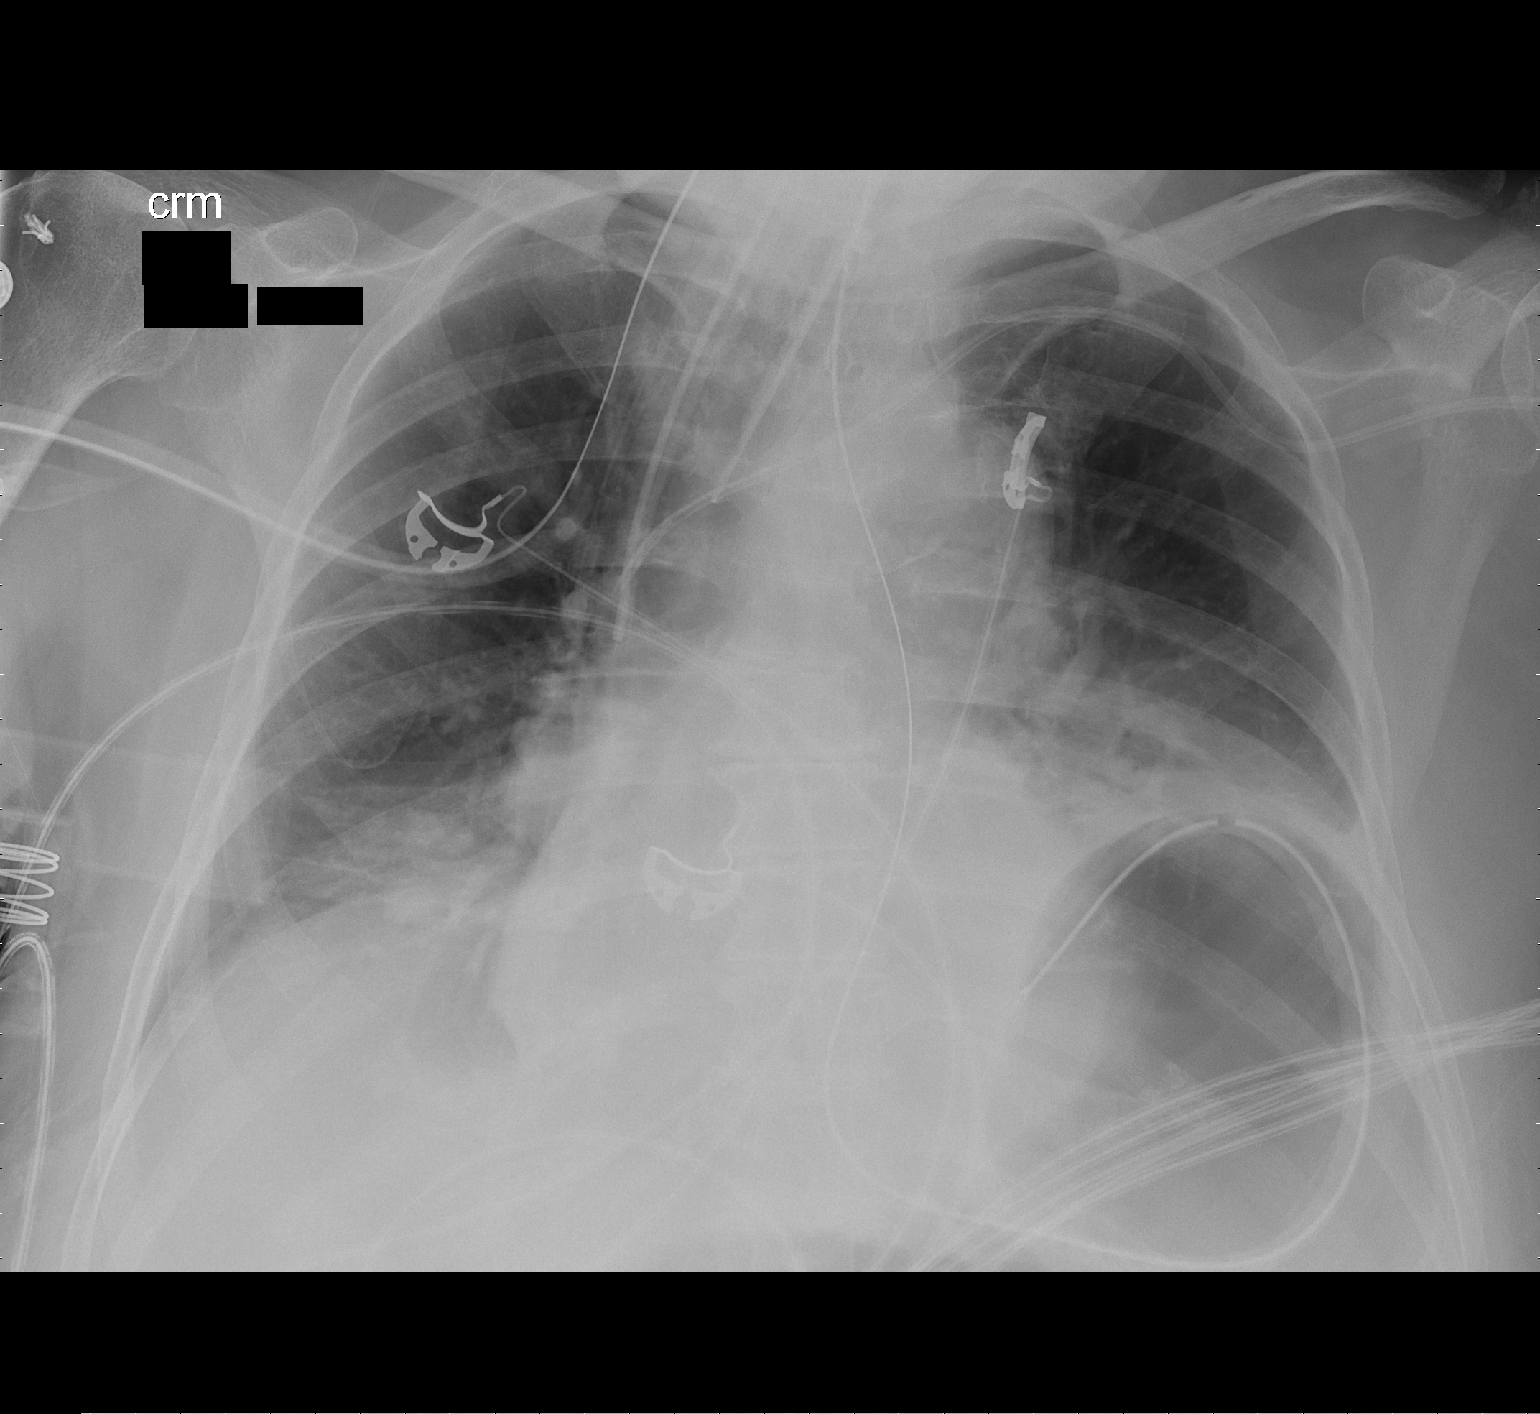

[1 of 1 positions shown; findings below may reference images not displayed]

FINDINGS: Endotracheal tube tip is 2.6 cm above carina.  Left
subclavian central line stable.  Nasogastric tube loops in the
stomach, which is partially distended by gas.  Bibasilar airspace
opacities, left greater than right, slightly increased since
previous exam.  Mild central pulmonary vascular congestion.
IMPRESSION: 1.  Interval advancement of endotracheal tube and placement of
nasogastric tube as above.
2. Slight worsening of bibasilar airspace opacities.

## 2013-06-24 IMAGING — CR DG CHEST 1V PORT
1 series · 1 of 1 positions shown · non-contrast
Comparison: Chest x-ray 08/21/2011.

CLINICAL DATA: Respiratory failure.  Shortness of breath.

PORTABLE CHEST - 1 VIEW

[AP]
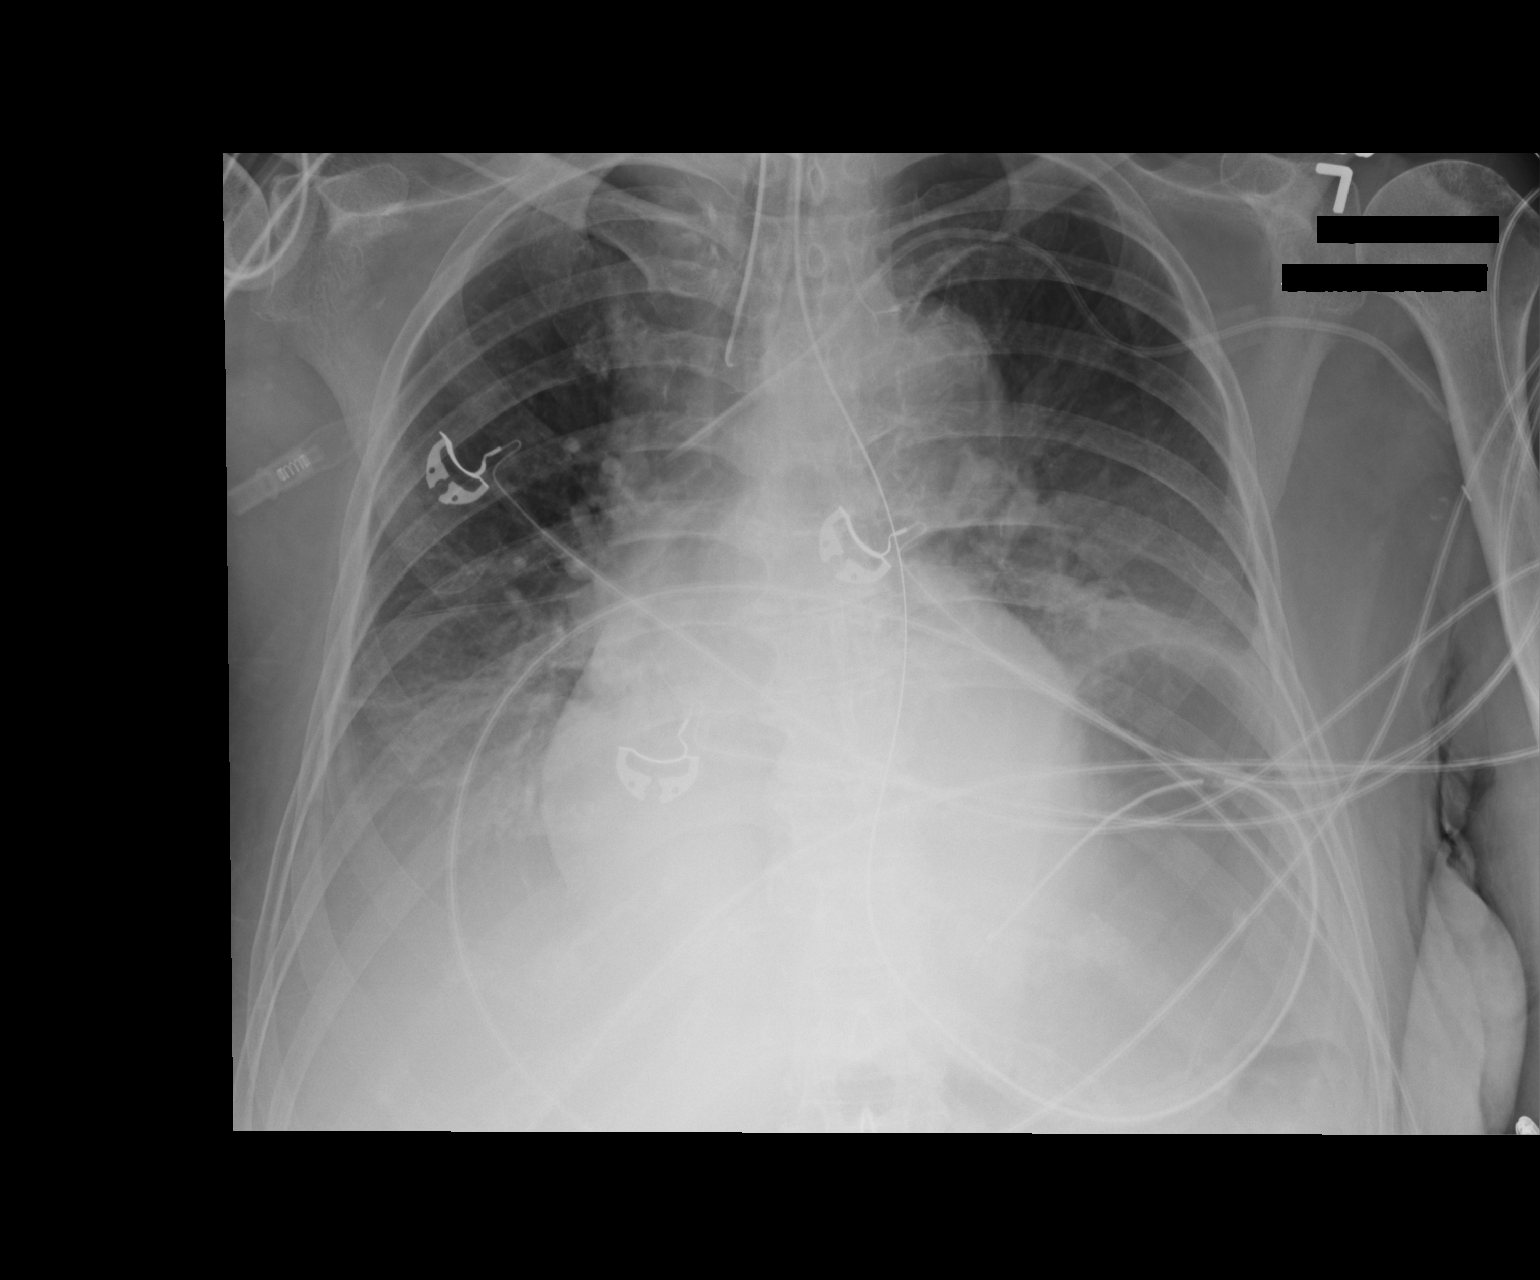

[1 of 1 positions shown; findings below may reference images not displayed]

FINDINGS: An endotracheal tube is in place with tip 3.4 cm above
the carina. There is a left-sided subclavian central venous
catheter with tip terminating in the proximal superior vena cava.
Nasogastric tube is seen coiled in the stomach.  Lung volumes are
low.  There are extensive bibasilar opacities which may represent
areas of atelectasis and/or consolidation with superimposed small
bilateral pleural effusions.  Mild elevation of the left
hemidiaphragm is unchanged.  Pulmonary vascular crowding
accentuated by low lung volumes, without evidence of pulmonary
edema.  Heart size is mildly enlarged (unchanged). The patient is
rotated to the right on today's exam, resulting in distortion of
the mediastinal contours and reduced diagnostic sensitivity and
specificity for mediastinal pathology.  Atherosclerotic
calcifications within the arch of the aorta.
IMPRESSION: 1.  Support apparatus, as above.
2.  Low lung volumes with persistent bibasilar opacities which may
represent areas of atelectasis and/or consolidation with
superimposed small bilateral pleural effusions.
3.  Mild cardiomegaly.
4.  Atherosclerosis.

## 2013-06-27 IMAGING — CR DG CHEST 1V PORT
1 series · 1 of 1 positions shown · non-contrast
Comparison: 08/23/2011

CLINICAL DATA: Atelectasis

PORTABLE CHEST - 1 VIEW

[view not recorded]
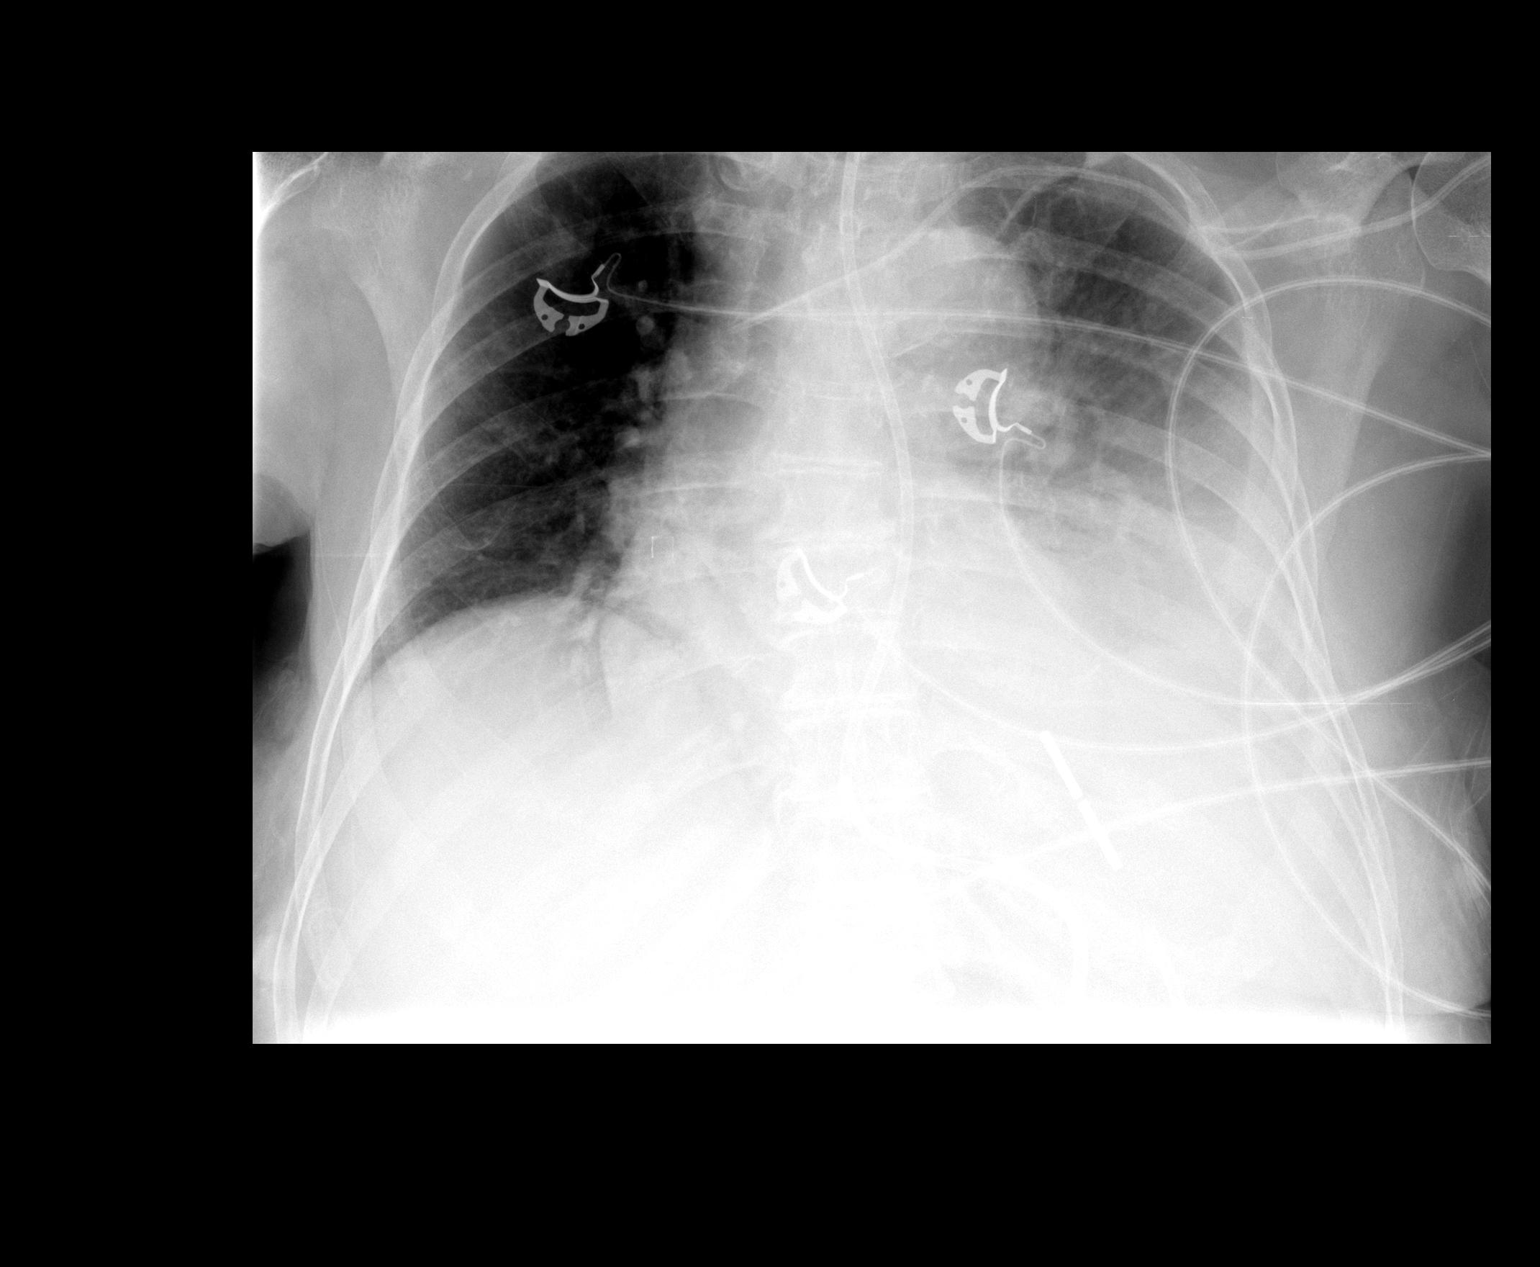

[1 of 1 positions shown; findings below may reference images not displayed]

FINDINGS: A feeding tube has been placed, tip not seen.  Left
central venous catheter stable position.  Low lung volumes.
Probable layering left pleural effusion with significant adjacent
atelectasis/consolidation at the left lung base.  Heart size upper
limits normal.  Tortuous thoracic aorta.
IMPRESSION: 1.  Interval placement of feeding tube.
2.  Persistent left pleural effusion and left lower lung opacities.

## 2013-06-28 ENCOUNTER — Ambulatory Visit (HOSPITAL_BASED_OUTPATIENT_CLINIC_OR_DEPARTMENT_OTHER): Payer: Medicare Other | Admitting: Hematology and Oncology

## 2013-06-28 ENCOUNTER — Encounter: Payer: Self-pay | Admitting: Hematology and Oncology

## 2013-06-28 ENCOUNTER — Other Ambulatory Visit (HOSPITAL_BASED_OUTPATIENT_CLINIC_OR_DEPARTMENT_OTHER): Payer: Medicare Other

## 2013-06-28 ENCOUNTER — Telehealth: Payer: Self-pay | Admitting: Hematology and Oncology

## 2013-06-28 ENCOUNTER — Ambulatory Visit (HOSPITAL_COMMUNITY)
Admission: RE | Admit: 2013-06-28 | Discharge: 2013-06-28 | Disposition: A | Discharge status: Home / Self Care | Payer: Medicare Other | Source: Ambulatory Visit | Attending: Hematology and Oncology | Admitting: Hematology and Oncology

## 2013-06-28 VITALS — BP 146/71 | HR 78 | Temp 98.3°F | Resp 18 | Ht 64.0 in | Wt 105.3 lb

## 2013-06-28 DIAGNOSIS — D631 Anemia in chronic kidney disease: Secondary | ICD-10-CM

## 2013-06-28 DIAGNOSIS — D519 Vitamin B12 deficiency anemia, unspecified: Secondary | ICD-10-CM

## 2013-06-28 DIAGNOSIS — N189 Chronic kidney disease, unspecified: Secondary | ICD-10-CM | POA: Insufficient documentation

## 2013-06-28 DIAGNOSIS — D649 Anemia, unspecified: Secondary | ICD-10-CM | POA: Insufficient documentation

## 2013-06-28 DIAGNOSIS — N289 Disorder of kidney and ureter, unspecified: Secondary | ICD-10-CM | POA: Insufficient documentation

## 2013-06-28 DIAGNOSIS — E538 Deficiency of other specified B group vitamins: Secondary | ICD-10-CM

## 2013-06-28 DIAGNOSIS — N039 Chronic nephritic syndrome with unspecified morphologic changes: Secondary | ICD-10-CM

## 2013-06-28 LAB — CBC WITH DIFFERENTIAL/PLATELET
BASO%: 0.6 % (ref 0.0–2.0)
BASOS ABS: 0 10*3/uL (ref 0.0–0.1)
EOS%: 2.8 % (ref 0.0–7.0)
Eosinophils Absolute: 0.2 10*3/uL (ref 0.0–0.5)
HEMATOCRIT: 20.9 % — AB (ref 34.8–46.6)
HEMOGLOBIN: 6.7 g/dL — AB (ref 11.6–15.9)
LYMPH%: 31.9 % (ref 14.0–49.7)
MCH: 29.8 pg (ref 25.1–34.0)
MCHC: 32.1 g/dL (ref 31.5–36.0)
MCV: 92.9 fL (ref 79.5–101.0)
MONO#: 0.8 10*3/uL (ref 0.1–0.9)
MONO%: 11.3 % (ref 0.0–14.0)
NEUT#: 3.8 10*3/uL (ref 1.5–6.5)
NEUT%: 53.4 % (ref 38.4–76.8)
NRBC: 0 % (ref 0–0)
PLATELETS: 143 10*3/uL — AB (ref 145–400)
RBC: 2.25 10*6/uL — ABNORMAL LOW (ref 3.70–5.45)
RDW: 14.5 % (ref 11.2–14.5)
WBC: 7.1 10*3/uL (ref 3.9–10.3)
lymph#: 2.3 10*3/uL (ref 0.9–3.3)

## 2013-06-28 LAB — COMPREHENSIVE METABOLIC PANEL (CC13)
ANION GAP: 7 meq/L (ref 3–11)
AST: 10 U/L (ref 5–34)
Albumin: 3.1 g/dL — ABNORMAL LOW (ref 3.5–5.0)
Alkaline Phosphatase: 55 U/L (ref 40–150)
BILIRUBIN TOTAL: 0.51 mg/dL (ref 0.20–1.20)
BUN: 49.3 mg/dL — ABNORMAL HIGH (ref 7.0–26.0)
CO2: 18 meq/L — AB (ref 22–29)
Calcium: 8.9 mg/dL (ref 8.4–10.4)
Chloride: 113 mEq/L — ABNORMAL HIGH (ref 98–109)
Creatinine: 2.4 mg/dL — ABNORMAL HIGH (ref 0.6–1.1)
Glucose: 88 mg/dl (ref 70–140)
Potassium: 5.6 mEq/L — ABNORMAL HIGH (ref 3.5–5.1)
SODIUM: 138 meq/L (ref 136–145)
TOTAL PROTEIN: 5.9 g/dL — AB (ref 6.4–8.3)

## 2013-06-28 LAB — HOLD TUBE, BLOOD BANK

## 2013-06-28 NOTE — Progress Notes (Signed)
[facility] OFFICE PROGRESS NOTE  BURNETT,[name], MD DIAGNOSIS:  Chronic anemia due to anemia chronic disease and Vitamin B12 deficiency  SUMMARY OF HEMATOLOGIC HISTORY: This is a pleasant lady was found to have anemia. Previously she was found to have B12 deficiency and had been receiving B12 injection monthly. For the anemia chronic disease she was getting Aranesp. She had mild thrombocytopenia which resolved with low-dose prednisone.  INTERVAL HISTORY: Olivia GangHelen L Adkins 80 y.o. female returns for further followup. She had recent fall at home. The patient complained of right-sided pain since a fall but denies any major injuries. Her appetite is stable and she has gained some weight. She continues to be compliant with vitamin B12 oral supplement. She complained of profound fatigue recently. She denies any chest pain, shortness of breath or dizziness. The patient denies any recent signs or symptoms of bleeding such as spontaneous epistaxis, hematuria or hematochezia.  I have reviewed the past medical history, past surgical history, social history and family history with the patient and they are unchanged from previous note.  ALLERGIES:  has No Known Allergies.  MEDICATIONS:  Current Outpatient Prescriptions  Medication Sig Dispense Refill  . atenolol (TENORMIN) 50 MG tablet Take 50 mg by mouth 2 (two) times daily.      Marland Kitchen. atorvastatin (LIPITOR) 20 MG tablet Take 20 mg by mouth at bedtime. daily      . Cholecalciferol (VITAMIN D-3) 1000 UNITS CAPS Take 1,000 Units by mouth daily.      . ciprofloxacin (CIPRO) 250 MG tablet Take 250 mg by mouth 2 (two) times daily. 10 day course started 04/29/13      . cloNIDine (CATAPRES) 0.1 MG tablet Take 0.2 mg by mouth 2 (two) times daily.       Marland Kitchen. docusate sodium (COLACE) 100 MG capsule Take 100 mg by mouth daily as needed for mild constipation.      . enoxaparin (LOVENOX) 30 MG/0.3ML injection Inject 0.3 mLs (30 mg total) into the skin  daily.  12 Syringe  0  . furosemide (LASIX) 20 MG tablet Take 20 mg by mouth daily.       Marland Kitchen. HYDROcodone-acetaminophen (HYCET) 7.5-325 mg/15 ml solution Take 5 mLs by mouth every 6 (six) hours as needed for moderate pain.  120 mL  0  . HYDROcodone-acetaminophen (NORCO/VICODIN) 5-325 MG per tablet Take 2 tablets by mouth every 6 (six) hours as needed for moderate pain.      Marland Kitchen. levETIRAcetam (KEPPRA) 250 MG tablet Take 250 mg by mouth 2 (two) times daily.      Marland Kitchen. levETIRAcetam (KEPPRA) 250 MG tablet TAKE 1 TABLET BY MOUTH TWICE A DAY  10 tablet  0  . megestrol (MEGACE) 40 MG tablet Take 40 mg by mouth 2 (two) times daily.      Marland Kitchen. oxybutynin (DITROPAN-XL) 5 MG 24 hr tablet Take 5 mg by mouth daily.      Marland Kitchen. tiZANidine (ZANAFLEX) 4 MG tablet Take 1 tablet (4 mg total) by mouth every 6 (six) hours as needed for muscle spasms.  30 tablet  0  . vitamin B-12 (CYANOCOBALAMIN) 1000 MCG tablet Take 1 tablet (1,000 mcg total) by mouth daily.  30 tablet  6   No current facility-administered medications for this visit.     REVIEW OF SYSTEMS:   Constitutional: Denies fevers, chills or night sweats Eyes: Denies blurriness of vision Ears, nose, mouth, throat, and face: Denies mucositis or sore throat Respiratory: Denies cough, dyspnea or wheezes Cardiovascular:  Denies palpitation, chest discomfort or lower extremity swelling Gastrointestinal:  Denies nausea, heartburn or change in bowel habits Skin: Denies abnormal skin rashes Lymphatics: Denies new lymphadenopathy or easy bruising Neurological:Denies numbness, tingling or new weaknesses Behavioral/Psych: Mood is stable, no new changes  All other systems were reviewed with the patient and are negative.  PHYSICAL EXAMINATION: ECOG PERFORMANCE STATUS: 2 - Symptomatic, <50% confined to bed  Filed Vitals:   06/28/13 1447  BP: 146/71  Pulse: 78  Temp: 98.3 F (36.8 C)  Resp: 18   Filed Weights   06/28/13 1447  Weight: 105 lb 4.8 oz (47.764 kg)     GENERAL:alert, no distress and comfortable. She looks thin and cachectic SKIN: skin color is pale, texture, turgor are normal, no rashes or significant lesions EYES: normal, Conjunctiva are pale and non-injected, sclera clear OROPHARYNX:no exudate, no erythema and lips, buccal mucosa, and tongue normal . Poor dentition Musculoskeletal:no cyanosis of digits and no clubbing  NEURO: alert & oriented x 3 with fluent speech, no focal motor/sensory deficits  LABORATORY DATA:  I have reviewed the data as listed Results for orders placed in visit on 06/28/13 (from the past 48 hour(s))  CBC WITH DIFFERENTIAL     Status: Abnormal   Collection Time    06/28/13  2:30 PM      Result Value Ref Range   WBC 7.1  3.9 - 10.3 10e3/uL   NEUT# 3.8  1.5 - 6.5 10e3/uL   HGB 6.7 (*) 11.6 - 15.9 g/dL   HCT 16.120.9 (*) 09.634.8 - 04.546.6 %   Platelets 143 (*) 145 - 400 10e3/uL   MCV 92.9  79.5 - 101.0 fL   MCH 29.8  25.1 - 34.0 pg   MCHC 32.1  31.5 - 36.0 g/dL   RBC 4.092.25 (*) 8.113.70 - 9.145.45 10e6/uL   RDW 14.5  11.2 - 14.5 %   lymph# 2.3  0.9 - 3.3 10e3/uL   MONO# 0.8  0.1 - 0.9 10e3/uL   Eosinophils Absolute 0.2  0.0 - 0.5 10e3/uL   Basophils Absolute 0.0  0.0 - 0.1 10e3/uL   NEUT% 53.4  38.4 - 76.8 %   LYMPH% 31.9  14.0 - 49.7 %   MONO% 11.3  0.0 - 14.0 %   EOS% 2.8  0.0 - 7.0 %   BASO% 0.6  0.0 - 2.0 %   nRBC 0  0 - 0 %    Lab Results  Component Value Date   WBC 7.1 06/28/2013   HGB 6.7* 06/28/2013   HCT 20.9* 06/28/2013   MCV 92.9 06/28/2013   PLT 143* 06/28/2013   ASSESSMENT & PLAN:  #1 Vitamin B12 deficiency Her most recent B12 is adequately replaced. I recommend oral B12 supplements. She agreed. #2 severe anemia due to chronic disease She responded well to Aranesp injection recently. The goal is to keep her hemoglobin above 10 g.  She had recent fall and I suspect this cause significant drop to her hemoglobin. We discussed some of the risks, benefits, and alternatives of blood transfusions. The patient is  symptomatic from anemia and the hemoglobin level is critically low.  Some of the side-effects to be expected including risks of transfusion reactions, chills, infection, syndrome of volume overload and risk of hospitalization from various reasons and the patient is willing to proceed and went ahead to sign consent today. After blood transfusion, I recommend we resume Aranesp injection every 3 weeks. #3 chewing snuff The patient is not interested to quit. #4  anorexia, improving [name] on low-dose Megace. We can discontinue that in the future if she continued to gain weight. #5 chronic kidney disease Her creatinine is stable. Continue to follow her primary care provider. All questions were answered. The patient knows to call the clinic with any problems, questions or concerns. No barriers to learning was detected.     Artis Delay, MD 06/28/2013 3:30 PM

## 2013-06-28 NOTE — Telephone Encounter (Signed)
gve the pt her may-aug 2015 appt calendar.

## 2013-06-29 ENCOUNTER — Ambulatory Visit (HOSPITAL_BASED_OUTPATIENT_CLINIC_OR_DEPARTMENT_OTHER): Payer: Medicare Other

## 2013-06-29 VITALS — BP 174/49 | HR 63 | Temp 97.7°F | Resp 17

## 2013-06-29 DIAGNOSIS — N289 Disorder of kidney and ureter, unspecified: Principal | ICD-10-CM

## 2013-06-29 DIAGNOSIS — N189 Chronic kidney disease, unspecified: Secondary | ICD-10-CM

## 2013-06-29 DIAGNOSIS — D631 Anemia in chronic kidney disease: Secondary | ICD-10-CM

## 2013-06-29 DIAGNOSIS — N039 Chronic nephritic syndrome with unspecified morphologic changes: Secondary | ICD-10-CM

## 2013-06-29 LAB — PREPARE RBC (CROSSMATCH)

## 2013-06-29 MED ORDER — SODIUM CHLORIDE 0.9 % IV SOLN
250.0000 mL | Freq: Once | INTRAVENOUS | Status: AC
  Administered 2013-06-29: 250 mL via INTRAVENOUS

## 2013-06-29 MED ORDER — ACETAMINOPHEN 325 MG PO TABS
ORAL_TABLET | ORAL | Status: AC
  Filled 2013-06-29: qty 2

## 2013-06-29 MED ORDER — ACETAMINOPHEN 325 MG PO TABS
650.0000 mg | ORAL_TABLET | Freq: Once | ORAL | Status: AC
  Administered 2013-06-29: 650 mg via ORAL

## 2013-06-29 MED ORDER — DARBEPOETIN ALFA-POLYSORBATE 300 MCG/0.6ML IJ SOLN
300.0000 ug | Freq: Once | INTRAMUSCULAR | Status: AC
  Administered 2013-06-29: 300 ug via SUBCUTANEOUS
  Filled 2013-06-29: qty 0.6

## 2013-06-29 NOTE — Patient Instructions (Signed)
Blood Transfusion Information WHAT IS A BLOOD TRANSFUSION? A transfusion is the replacement of blood or some of its parts. Blood is made up of multiple cells which provide different functions.  Red blood cells carry oxygen and are used for blood loss replacement.  White blood cells fight against infection.  Platelets control bleeding.  Plasma helps clot blood.  Other blood products are available for specialized needs, such as hemophilia or other clotting disorders. BEFORE THE TRANSFUSION  Who gives blood for transfusions?   You may be able to donate blood to be used at a later date on yourself (autologous donation).  Relatives can be asked to donate blood. This is generally not any safer than if you have received blood from a stranger. The same precautions are taken to ensure safety when a relative's blood is donated.  Healthy volunteers who are fully evaluated to make sure their blood is safe. This is blood bank blood. Transfusion therapy is the safest it has ever been in the practice of medicine. Before blood is taken from a donor, a complete history is taken to make sure that person has no history of diseases nor engages in risky social behavior (examples are intravenous drug use or sexual activity with multiple partners). The donor's travel history is screened to minimize risk of transmitting infections, such as malaria. The donated blood is tested for signs of infectious diseases, such as HIV and hepatitis. The blood is then tested to be sure it is compatible with you in order to minimize the chance of a transfusion reaction. If you or a relative donates blood, this is often done in anticipation of surgery and is not appropriate for emergency situations. It takes many days to process the donated blood. RISKS AND COMPLICATIONS Although transfusion therapy is very safe and saves many lives, the main dangers of transfusion include:   Getting an infectious disease.  Developing a  transfusion reaction. This is an allergic reaction to something in the blood you were given. Every precaution is taken to prevent this. The decision to have a blood transfusion has been considered carefully by your caregiver before blood is given. Blood is not given unless the benefits outweigh the risks. AFTER THE TRANSFUSION  Right after receiving a blood transfusion, you will usually feel much better and more energetic. This is especially true if your red blood cells have gotten low (anemic). The transfusion raises the level of the red blood cells which carry oxygen, and this usually causes an energy increase.  The nurse administering the transfusion will monitor you carefully for complications. HOME CARE INSTRUCTIONS  No special instructions are needed after a transfusion. You may find your energy is better. Speak with your caregiver about any limitations on activity for underlying diseases you may have. SEEK MEDICAL CARE IF:   Your condition is not improving after your transfusion.  You develop redness or irritation at the intravenous (IV) site. SEEK IMMEDIATE MEDICAL CARE IF:  Any of the following symptoms occur over the next 12 hours:  Shaking chills.  You have a temperature by mouth above 102 F (38.9 C), not controlled by medicine.  Chest, back, or muscle pain.  People around you feel you are not acting correctly or are confused.  Shortness of breath or difficulty breathing.  Dizziness and fainting.  You get a rash or develop hives.  You have a decrease in urine output.  Your urine turns a dark color or changes to pink, red, or brown. Any of the following   symptoms occur over the next 10 days:  You have a temperature by mouth above 102 F (38.9 C), not controlled by medicine.  Shortness of breath.  Weakness after normal activity.  The white part of the eye turns yellow (jaundice).  You have a decrease in the amount of urine or are urinating less often.  Your  urine turns a dark color or changes to pink, red, or brown. Document Released: 02/09/2000 Document Revised: 05/06/2011 Document Reviewed: 09/28/2007 [employer] Patient Information 2014 [employer].  

## 2013-06-30 LAB — TYPE AND SCREEN
ABO/RH(D): O POS
ANTIBODY SCREEN: NEGATIVE
UNIT DIVISION: 0
Unit division: 0

## 2013-07-20 ENCOUNTER — Other Ambulatory Visit (HOSPITAL_BASED_OUTPATIENT_CLINIC_OR_DEPARTMENT_OTHER): Payer: Medicare Other

## 2013-07-20 DIAGNOSIS — N039 Chronic nephritic syndrome with unspecified morphologic changes: Secondary | ICD-10-CM

## 2013-07-20 DIAGNOSIS — N289 Disorder of kidney and ureter, unspecified: Principal | ICD-10-CM

## 2013-07-20 DIAGNOSIS — D519 Vitamin B12 deficiency anemia, unspecified: Secondary | ICD-10-CM

## 2013-07-20 DIAGNOSIS — D631 Anemia in chronic kidney disease: Secondary | ICD-10-CM

## 2013-07-20 DIAGNOSIS — N189 Chronic kidney disease, unspecified: Secondary | ICD-10-CM

## 2013-07-20 LAB — CBC WITH DIFFERENTIAL/PLATELET
BASO%: 1.3 % (ref 0.0–2.0)
Basophils Absolute: 0.1 10*3/uL (ref 0.0–0.1)
EOS%: 2.1 % (ref 0.0–7.0)
Eosinophils Absolute: 0.1 10*3/uL (ref 0.0–0.5)
HCT: 40.4 % (ref 34.8–46.6)
HGB: 12.8 g/dL (ref 11.6–15.9)
LYMPH%: 28.6 % (ref 14.0–49.7)
MCH: 29.7 pg (ref 25.1–34.0)
MCHC: 31.8 g/dL (ref 31.5–36.0)
MCV: 93.4 fL (ref 79.5–101.0)
MONO#: 0.5 10*3/uL (ref 0.1–0.9)
MONO%: 7.7 % (ref 0.0–14.0)
NEUT#: 4.1 10*3/uL (ref 1.5–6.5)
NEUT%: 60.3 % (ref 38.4–76.8)
Platelets: 155 10*3/uL (ref 145–400)
RBC: 4.33 10*6/uL (ref 3.70–5.45)
RDW: 16 % — ABNORMAL HIGH (ref 11.2–14.5)
WBC: 6.8 10*3/uL (ref 3.9–10.3)
lymph#: 1.9 10*3/uL (ref 0.9–3.3)

## 2013-07-20 LAB — COMPREHENSIVE METABOLIC PANEL (CC13)
ALK PHOS: 71 U/L (ref 40–150)
ALT: 8 U/L (ref 0–55)
AST: 13 U/L (ref 5–34)
Albumin: 3.6 g/dL (ref 3.5–5.0)
Anion Gap: 10 mEq/L (ref 3–11)
BILIRUBIN TOTAL: 0.77 mg/dL (ref 0.20–1.20)
BUN: 55.9 mg/dL — ABNORMAL HIGH (ref 7.0–26.0)
CO2: 16 mEq/L — ABNORMAL LOW (ref 22–29)
Calcium: 9.1 mg/dL (ref 8.4–10.4)
Chloride: 111 mEq/L — ABNORMAL HIGH (ref 98–109)
Creatinine: 2.3 mg/dL — ABNORMAL HIGH (ref 0.6–1.1)
Glucose: 79 mg/dl (ref 70–140)
Potassium: 6 mEq/L — ABNORMAL HIGH (ref 3.5–5.1)
SODIUM: 137 meq/L (ref 136–145)
TOTAL PROTEIN: 6.5 g/dL (ref 6.4–8.3)

## 2013-07-20 LAB — HOLD TUBE, BLOOD BANK

## 2013-07-22 ENCOUNTER — Other Ambulatory Visit: Payer: Self-pay | Admitting: Hematology and Oncology

## 2013-08-10 ENCOUNTER — Other Ambulatory Visit (HOSPITAL_BASED_OUTPATIENT_CLINIC_OR_DEPARTMENT_OTHER): Payer: Medicare Other

## 2013-08-10 ENCOUNTER — Encounter: Payer: Self-pay | Admitting: *Deleted

## 2013-08-10 DIAGNOSIS — N189 Chronic kidney disease, unspecified: Secondary | ICD-10-CM

## 2013-08-10 DIAGNOSIS — D631 Anemia in chronic kidney disease: Secondary | ICD-10-CM

## 2013-08-10 DIAGNOSIS — N039 Chronic nephritic syndrome with unspecified morphologic changes: Secondary | ICD-10-CM

## 2013-08-10 DIAGNOSIS — D519 Vitamin B12 deficiency anemia, unspecified: Secondary | ICD-10-CM

## 2013-08-10 LAB — COMPREHENSIVE METABOLIC PANEL (CC13)
ALT: 6 U/L (ref 0–55)
ANION GAP: 8 meq/L (ref 3–11)
AST: 11 U/L (ref 5–34)
Albumin: 3.2 g/dL — ABNORMAL LOW (ref 3.5–5.0)
Alkaline Phosphatase: 54 U/L (ref 40–150)
BUN: 60.3 mg/dL — AB (ref 7.0–26.0)
CO2: 20 meq/L — AB (ref 22–29)
CREATININE: 2.5 mg/dL — AB (ref 0.6–1.1)
Calcium: 8.8 mg/dL (ref 8.4–10.4)
Chloride: 108 mEq/L (ref 98–109)
Glucose: 99 mg/dl (ref 70–140)
Potassium: 5 mEq/L (ref 3.5–5.1)
Sodium: 137 mEq/L (ref 136–145)
Total Bilirubin: 0.5 mg/dL (ref 0.20–1.20)
Total Protein: 5.9 g/dL — ABNORMAL LOW (ref 6.4–8.3)

## 2013-08-10 LAB — CBC WITH DIFFERENTIAL/PLATELET
BASO%: 1 % (ref 0.0–2.0)
Basophils Absolute: 0.1 10*3/uL (ref 0.0–0.1)
EOS%: 3 % (ref 0.0–7.0)
Eosinophils Absolute: 0.2 10*3/uL (ref 0.0–0.5)
HEMATOCRIT: 34.2 % — AB (ref 34.8–46.6)
HGB: 11.3 g/dL — ABNORMAL LOW (ref 11.6–15.9)
LYMPH%: 36.3 % (ref 14.0–49.7)
MCH: 29.9 pg (ref 25.1–34.0)
MCHC: 32.9 g/dL (ref 31.5–36.0)
MCV: 90.9 fL (ref 79.5–101.0)
MONO#: 0.5 10*3/uL (ref 0.1–0.9)
MONO%: 8.9 % (ref 0.0–14.0)
NEUT#: 2.8 10*3/uL (ref 1.5–6.5)
NEUT%: 50.8 % (ref 38.4–76.8)
PLATELETS: 135 10*3/uL — AB (ref 145–400)
RBC: 3.77 10*6/uL (ref 3.70–5.45)
RDW: 14.9 % — ABNORMAL HIGH (ref 11.2–14.5)
WBC: 5.5 10*3/uL (ref 3.9–10.3)
lymph#: 2 10*3/uL (ref 0.9–3.3)

## 2013-08-10 NOTE — Progress Notes (Signed)
Informed pt of lab results, she may go home and return on 7/07 as scheduled.  She verbalized understanding.

## 2013-08-22 ENCOUNTER — Other Ambulatory Visit: Payer: Self-pay | Admitting: Hematology and Oncology

## 2013-08-31 ENCOUNTER — Other Ambulatory Visit: Payer: Self-pay | Admitting: Hematology and Oncology

## 2013-08-31 ENCOUNTER — Ambulatory Visit (HOSPITAL_BASED_OUTPATIENT_CLINIC_OR_DEPARTMENT_OTHER): Payer: Medicare Other

## 2013-08-31 ENCOUNTER — Other Ambulatory Visit (HOSPITAL_BASED_OUTPATIENT_CLINIC_OR_DEPARTMENT_OTHER): Payer: Medicare Other

## 2013-08-31 VITALS — BP 135/50 | HR 60 | Temp 98.0°F

## 2013-08-31 DIAGNOSIS — D631 Anemia in chronic kidney disease: Secondary | ICD-10-CM

## 2013-08-31 DIAGNOSIS — N189 Chronic kidney disease, unspecified: Secondary | ICD-10-CM

## 2013-08-31 DIAGNOSIS — D638 Anemia in other chronic diseases classified elsewhere: Secondary | ICD-10-CM

## 2013-08-31 DIAGNOSIS — D519 Vitamin B12 deficiency anemia, unspecified: Secondary | ICD-10-CM

## 2013-08-31 LAB — CBC WITH DIFFERENTIAL/PLATELET
BASO%: 0.6 % (ref 0.0–2.0)
Basophils Absolute: 0 10*3/uL (ref 0.0–0.1)
EOS ABS: 0.2 10*3/uL (ref 0.0–0.5)
EOS%: 3.3 % (ref 0.0–7.0)
HEMATOCRIT: 30.7 % — AB (ref 34.8–46.6)
HEMOGLOBIN: 9.9 g/dL — AB (ref 11.6–15.9)
LYMPH#: 1.8 10*3/uL (ref 0.9–3.3)
LYMPH%: 36.9 % (ref 14.0–49.7)
MCH: 28.8 pg (ref 25.1–34.0)
MCHC: 32.2 g/dL (ref 31.5–36.0)
MCV: 89.2 fL (ref 79.5–101.0)
MONO#: 0.5 10*3/uL (ref 0.1–0.9)
MONO%: 10.7 % (ref 0.0–14.0)
NEUT%: 48.5 % (ref 38.4–76.8)
NEUTROS ABS: 2.4 10*3/uL (ref 1.5–6.5)
Platelets: 115 10*3/uL — ABNORMAL LOW (ref 145–400)
RBC: 3.44 10*6/uL — ABNORMAL LOW (ref 3.70–5.45)
RDW: 13.6 % (ref 11.2–14.5)
WBC: 4.9 10*3/uL (ref 3.9–10.3)

## 2013-08-31 MED ORDER — DARBEPOETIN ALFA-POLYSORBATE 300 MCG/0.6ML IJ SOLN
300.0000 ug | Freq: Once | INTRAMUSCULAR | Status: AC
  Administered 2013-08-31: 300 ug via SUBCUTANEOUS
  Filled 2013-08-31: qty 0.6

## 2013-09-28 ENCOUNTER — Other Ambulatory Visit: Payer: Self-pay

## 2013-09-30 ENCOUNTER — Ambulatory Visit: Payer: Medicare Other

## 2013-09-30 ENCOUNTER — Ambulatory Visit (HOSPITAL_BASED_OUTPATIENT_CLINIC_OR_DEPARTMENT_OTHER): Payer: Medicare Other | Admitting: Hematology and Oncology

## 2013-09-30 ENCOUNTER — Encounter: Payer: Self-pay | Admitting: Hematology and Oncology

## 2013-09-30 ENCOUNTER — Other Ambulatory Visit (HOSPITAL_BASED_OUTPATIENT_CLINIC_OR_DEPARTMENT_OTHER): Payer: Medicare Other

## 2013-09-30 VITALS — BP 162/59 | HR 72 | Temp 97.9°F | Resp 16 | Ht 64.0 in | Wt 99.8 lb

## 2013-09-30 DIAGNOSIS — F17200 Nicotine dependence, unspecified, uncomplicated: Secondary | ICD-10-CM

## 2013-09-30 DIAGNOSIS — D518 Other vitamin B12 deficiency anemias: Secondary | ICD-10-CM

## 2013-09-30 DIAGNOSIS — N189 Chronic kidney disease, unspecified: Secondary | ICD-10-CM

## 2013-09-30 DIAGNOSIS — K5900 Constipation, unspecified: Secondary | ICD-10-CM

## 2013-09-30 DIAGNOSIS — Z720 Tobacco use: Secondary | ICD-10-CM | POA: Insufficient documentation

## 2013-09-30 DIAGNOSIS — E875 Hyperkalemia: Secondary | ICD-10-CM

## 2013-09-30 DIAGNOSIS — D631 Anemia in chronic kidney disease: Secondary | ICD-10-CM

## 2013-09-30 DIAGNOSIS — K5901 Slow transit constipation: Secondary | ICD-10-CM

## 2013-09-30 DIAGNOSIS — D519 Vitamin B12 deficiency anemia, unspecified: Secondary | ICD-10-CM

## 2013-09-30 DIAGNOSIS — D649 Anemia, unspecified: Secondary | ICD-10-CM

## 2013-09-30 DIAGNOSIS — D696 Thrombocytopenia, unspecified: Secondary | ICD-10-CM

## 2013-09-30 HISTORY — DX: Constipation, unspecified: K59.00

## 2013-09-30 LAB — CBC WITH DIFFERENTIAL/PLATELET
BASO%: 1.1 % (ref 0.0–2.0)
Basophils Absolute: 0.1 10*3/uL (ref 0.0–0.1)
EOS ABS: 0.2 10*3/uL (ref 0.0–0.5)
EOS%: 2.3 % (ref 0.0–7.0)
HCT: 36.7 % (ref 34.8–46.6)
HGB: 11.9 g/dL (ref 11.6–15.9)
LYMPH#: 1.8 10*3/uL (ref 0.9–3.3)
LYMPH%: 25.5 % (ref 14.0–49.7)
MCH: 29.3 pg (ref 25.1–34.0)
MCHC: 32.4 g/dL (ref 31.5–36.0)
MCV: 90.6 fL (ref 79.5–101.0)
MONO#: 0.5 10*3/uL (ref 0.1–0.9)
MONO%: 7.1 % (ref 0.0–14.0)
NEUT%: 64 % (ref 38.4–76.8)
NEUTROS ABS: 4.6 10*3/uL (ref 1.5–6.5)
Platelets: 147 10*3/uL (ref 145–400)
RBC: 4.05 10*6/uL (ref 3.70–5.45)
RDW: 15 % — ABNORMAL HIGH (ref 11.2–14.5)
WBC: 7.2 10*3/uL (ref 3.9–10.3)

## 2013-09-30 MED ORDER — SENNA 8.6 MG PO TABS: 2.0000 | ORAL_TABLET | Freq: Two times a day (BID) | ORAL | Status: AC

## 2013-09-30 MED ORDER — DARBEPOETIN ALFA-POLYSORBATE 500 MCG/ML IJ SOLN: 300.0000 ug | Freq: Once | INTRAMUSCULAR | Status: DC

## 2013-09-30 NOTE — Assessment & Plan Note (Signed)
I spent some time counseling the patient the importance of tobacco cessation. she is not interested to quit. 

## 2013-09-30 NOTE — Assessment & Plan Note (Signed)
Anemia is improving. She will continue on vitamin B12 supplements by mouth.

## 2013-09-30 NOTE — Assessment & Plan Note (Signed)
[name] on treatment for hyperkalemia. I would defer to her primary care provider for management.

## 2013-09-30 NOTE — Assessment & Plan Note (Signed)
She will only receive her Aranesp injection to keep hemoglobin above 10 g. She does not need injection today. I am changing the frequency of visits and injection to every 6 weeks.

## 2013-09-30 NOTE — Progress Notes (Signed)
[facility] OFFICE PROGRESS NOTE  BURNETT,[name], MD SUMMARY OF HEMATOLOGIC HISTORY: This is a pleasant lady was found to have anemia. Previously she was found to have B12 deficiency and had been receiving B12 injection monthly. For the anemia chronic disease she was getting Aranesp. She had mild thrombocytopenia which resolved with low-dose prednisone. INTERVAL HISTORY: [name] 80 y.o. female returns for further followup. She feels well. Her appetite remained poor and she has lost some weight. She denies symptoms of anemia. I have reviewed the past medical history, past surgical history, social history and family history with the patient and they are unchanged from previous note.  ALLERGIES:  has No Known Allergies.  MEDICATIONS:  Current Outpatient Prescriptions  Medication Sig Dispense Refill  . atenolol (TENORMIN) 50 MG tablet Take 50 mg by mouth 2 (two) times daily.      Marland Kitchen atorvastatin (LIPITOR) 20 MG tablet Take 20 mg by mouth at bedtime. daily      . Cholecalciferol (VITAMIN D-3) 1000 UNITS CAPS Take 1,000 Units by mouth daily.      . ciprofloxacin (CIPRO) 250 MG tablet Take 250 mg by mouth 2 (two) times daily. 10 day course started 04/29/13      . cloNIDine (CATAPRES) 0.1 MG tablet Take 0.2 mg by mouth 2 (two) times daily.       . [facility] VITAMIN B12 1000 MCG tablet TAKE 1 TABLET (1,000 MCG TOTAL) BY MOUTH DAILY.  30 tablet  6  . docusate sodium (COLACE) 100 MG capsule Take 100 mg by mouth daily as needed for mild constipation.      . enoxaparin (LOVENOX) 30 MG/0.3ML injection Inject 0.3 mLs (30 mg total) into the skin daily.  12 Syringe  0  . furosemide (LASIX) 20 MG tablet Take 20 mg by mouth daily.       Marland Kitchen HYDROcodone-acetaminophen (HYCET) 7.5-325 mg/15 ml solution Take 5 mLs by mouth every 6 (six) hours as needed for moderate pain.  120 mL  0  . HYDROcodone-acetaminophen (NORCO/VICODIN) 5-325 MG per tablet Take 2 tablets by mouth every 6 (six) hours as needed  for moderate pain.      Marland Kitchen levETIRAcetam (KEPPRA) 250 MG tablet Take 250 mg by mouth 2 (two) times daily.      Marland Kitchen levETIRAcetam (KEPPRA) 250 MG tablet TAKE 1 TABLET BY MOUTH TWICE A DAY  10 tablet  0  . megestrol (MEGACE) 40 MG tablet Take 40 mg by mouth 2 (two) times daily.      . megestrol (MEGACE) 40 MG tablet TAKE 1 TABLET (40 MG TOTAL) BY MOUTH 2 (TWO) TIMES DAILY.  60 tablet  1  . mirtazapine (REMERON) 15 MG tablet Take 15 mg by mouth daily.      Marland Kitchen oxybutynin (DITROPAN-XL) 5 MG 24 hr tablet Take 5 mg by mouth daily.      Marland Kitchen tiZANidine (ZANAFLEX) 4 MG tablet Take 1 tablet (4 mg total) by mouth every 6 (six) hours as needed for muscle spasms.  30 tablet  0  . senna (SENOKOT) 8.6 MG TABS tablet Take 2 tablets (17.2 mg total) by mouth 2 (two) times daily.  120 each  6   No current facility-administered medications for this visit.     REVIEW OF SYSTEMS:   Constitutional: Denies fevers, chills or night sweats Eyes: Denies blurriness of vision Ears, nose, mouth, throat, and face: Denies mucositis or sore throat Respiratory: Denies cough, dyspnea or wheezes Cardiovascular: Denies palpitation, chest discomfort or  lower extremity swelling Gastrointestinal:  Denies nausea, heartburn or change in bowel habits Skin: Denies abnormal skin rashes Lymphatics: Denies new lymphadenopathy or easy bruising Neurological:Denies numbness, tingling or new weaknesses Behavioral/Psych: Mood is stable, no new changes  All other systems were reviewed with the patient and are negative.  PHYSICAL EXAMINATION: ECOG PERFORMANCE STATUS: 0 - Asymptomatic  Filed Vitals:   09/30/13 1355  BP: 162/59  Pulse: 72  Temp: 97.9 F (36.6 C)  Resp: 16   Filed Weights   09/30/13 1355  Weight: 99 lb 12.8 oz (45.269 kg)    GENERAL:alert, no distress and comfortable she is thin and cachectic SKIN: skin color, texture, turgor are normal, no rashes or significant lesions EYES: normal, Conjunctiva are pink and  non-injected, sclera clear Musculoskeletal:no cyanosis of digits and no clubbing  NEURO: alert & oriented x 3 with fluent speech, no focal motor/sensory deficits  LABORATORY DATA:  I have reviewed the data as listed Results for orders placed in visit on 09/30/13 (from the past 48 hour(s))  CBC WITH DIFFERENTIAL     Status: Abnormal   Collection Time    09/30/13  1:40 PM      Result Value Ref Range   WBC 7.2  3.9 - 10.3 10e3/uL   NEUT# 4.6  1.5 - 6.5 10e3/uL   HGB 11.9  11.6 - 15.9 g/dL   HCT 16.136.7  09.634.8 - 04.546.6 %   Platelets 147  145 - 400 10e3/uL   MCV 90.6  79.5 - 101.0 fL   MCH 29.3  25.1 - 34.0 pg   MCHC 32.4  31.5 - 36.0 g/dL   RBC 4.094.05  8.113.70 - 9.145.45 10e6/uL   RDW 15.0 (*) 11.2 - 14.5 %   lymph# 1.8  0.9 - 3.3 10e3/uL   MONO# 0.5  0.1 - 0.9 10e3/uL   Eosinophils Absolute 0.2  0.0 - 0.5 10e3/uL   Basophils Absolute 0.1  0.0 - 0.1 10e3/uL   NEUT% 64.0  38.4 - 76.8 %   LYMPH% 25.5  14.0 - 49.7 %   MONO% 7.1  0.0 - 14.0 %   EOS% 2.3  0.0 - 7.0 %   BASO% 1.1  0.0 - 2.0 %    Lab Results  Component Value Date   WBC 7.2 09/30/2013   HGB 11.9 09/30/2013   HCT 36.7 09/30/2013   MCV 90.6 09/30/2013   PLT 147 09/30/2013    ASSESSMENT & PLAN:  Vitamin B12 deficiency anemia Anemia is improving. She will continue on vitamin B12 supplements by mouth.  Hyperkalemia [name] on treatment for hyperkalemia. I would defer to her primary care provider for management.  Anemia of renal disease She will only receive her Aranesp injection to keep hemoglobin above 10 g. She does not need injection today. I am changing the frequency of visits and injection to every 6 weeks.  Tobacco abuse I spent some time counseling the patient the importance of tobacco cessation. she is not interested to quit.     All questions were answered. The patient knows to call the clinic with any problems, questions or concerns. No barriers to learning was detected.  I spent 15 minutes counseling the patient face to  face. The total time spent in the appointment was 20 minutes and more than 50% was on counseling.     [facility] HospitalGORSUCH, Hezakiah Champeau, MD 09/30/2013 8:17 PM

## 2013-10-01 ENCOUNTER — Telehealth: Payer: Self-pay | Admitting: Hematology and Oncology

## 2013-10-01 NOTE — Telephone Encounter (Signed)
Per 08/06 [facility] labs/inj/ov, mailed AVS to pt......KJ

## 2013-10-21 ENCOUNTER — Ambulatory Visit (HOSPITAL_BASED_OUTPATIENT_CLINIC_OR_DEPARTMENT_OTHER): Payer: Medicare Other

## 2013-10-21 ENCOUNTER — Other Ambulatory Visit (HOSPITAL_BASED_OUTPATIENT_CLINIC_OR_DEPARTMENT_OTHER): Payer: Medicare Other

## 2013-10-21 VITALS — BP 100/48 | HR 66 | Temp 97.6°F

## 2013-10-21 DIAGNOSIS — D638 Anemia in other chronic diseases classified elsewhere: Secondary | ICD-10-CM

## 2013-10-21 DIAGNOSIS — D631 Anemia in chronic kidney disease: Secondary | ICD-10-CM

## 2013-10-21 DIAGNOSIS — N189 Chronic kidney disease, unspecified: Secondary | ICD-10-CM

## 2013-10-21 DIAGNOSIS — D519 Vitamin B12 deficiency anemia, unspecified: Secondary | ICD-10-CM

## 2013-10-21 LAB — CBC WITH DIFFERENTIAL/PLATELET
BASO%: 1 % (ref 0.0–2.0)
BASOS ABS: 0.1 10*3/uL (ref 0.0–0.1)
EOS ABS: 0.2 10*3/uL (ref 0.0–0.5)
EOS%: 4.4 % (ref 0.0–7.0)
HEMATOCRIT: 28.8 % — AB (ref 34.8–46.6)
HEMOGLOBIN: 9.3 g/dL — AB (ref 11.6–15.9)
LYMPH#: 1.7 10*3/uL (ref 0.9–3.3)
LYMPH%: 32.5 % (ref 14.0–49.7)
MCH: 29.4 pg (ref 25.1–34.0)
MCHC: 32.4 g/dL (ref 31.5–36.0)
MCV: 90.8 fL (ref 79.5–101.0)
MONO#: 0.4 10*3/uL (ref 0.1–0.9)
MONO%: 7.9 % (ref 0.0–14.0)
NEUT%: 54.2 % (ref 38.4–76.8)
NEUTROS ABS: 2.9 10*3/uL (ref 1.5–6.5)
PLATELETS: 102 10*3/uL — AB (ref 145–400)
RBC: 3.17 10*6/uL — ABNORMAL LOW (ref 3.70–5.45)
RDW: 14.3 % (ref 11.2–14.5)
WBC: 5.3 10*3/uL (ref 3.9–10.3)

## 2013-10-21 MED ORDER — DARBEPOETIN ALFA-POLYSORBATE 300 MCG/0.6ML IJ SOLN
300.0000 ug | Freq: Once | INTRAMUSCULAR | Status: AC
  Administered 2013-10-21: 300 ug via SUBCUTANEOUS
  Filled 2013-10-21: qty 0.6

## 2013-12-02 ENCOUNTER — Other Ambulatory Visit (HOSPITAL_BASED_OUTPATIENT_CLINIC_OR_DEPARTMENT_OTHER): Payer: Medicare Other

## 2013-12-02 ENCOUNTER — Ambulatory Visit: Payer: Medicare Other

## 2013-12-02 DIAGNOSIS — N189 Chronic kidney disease, unspecified: Secondary | ICD-10-CM

## 2013-12-02 DIAGNOSIS — D631 Anemia in chronic kidney disease: Secondary | ICD-10-CM

## 2013-12-02 DIAGNOSIS — D519 Vitamin B12 deficiency anemia, unspecified: Secondary | ICD-10-CM

## 2013-12-02 LAB — CBC WITH DIFFERENTIAL/PLATELET
BASO%: 0.5 % (ref 0.0–2.0)
Basophils Absolute: 0 10*3/uL (ref 0.0–0.1)
EOS%: 3.3 % (ref 0.0–7.0)
Eosinophils Absolute: 0.2 10*3/uL (ref 0.0–0.5)
HCT: 32.6 % — ABNORMAL LOW (ref 34.8–46.6)
HGB: 10.1 g/dL — ABNORMAL LOW (ref 11.6–15.9)
LYMPH%: 31.3 % (ref 14.0–49.7)
MCH: 29.3 pg (ref 25.1–34.0)
MCHC: 31 g/dL — AB (ref 31.5–36.0)
MCV: 94.5 fL (ref 79.5–101.0)
MONO#: 0.6 10*3/uL (ref 0.1–0.9)
MONO%: 9.6 % (ref 0.0–14.0)
NEUT#: 3.5 10*3/uL (ref 1.5–6.5)
NEUT%: 55.3 % (ref 38.4–76.8)
PLATELETS: 84 10*3/uL — AB (ref 145–400)
RBC: 3.45 10*6/uL — ABNORMAL LOW (ref 3.70–5.45)
RDW: 13.2 % (ref 11.2–14.5)
WBC: 6.4 10*3/uL (ref 3.9–10.3)
lymph#: 2 10*3/uL (ref 0.9–3.3)

## 2013-12-02 MED ORDER — DARBEPOETIN ALFA-POLYSORBATE 500 MCG/ML IJ SOLN: 300.0000 ug | Freq: Once | INTRAMUSCULAR | Status: DC

## 2014-01-13 ENCOUNTER — Other Ambulatory Visit (HOSPITAL_BASED_OUTPATIENT_CLINIC_OR_DEPARTMENT_OTHER): Payer: Medicare Other

## 2014-01-13 ENCOUNTER — Ambulatory Visit (HOSPITAL_BASED_OUTPATIENT_CLINIC_OR_DEPARTMENT_OTHER): Payer: Medicare Other

## 2014-01-13 ENCOUNTER — Other Ambulatory Visit: Payer: Self-pay | Admitting: Hematology and Oncology

## 2014-01-13 DIAGNOSIS — D519 Vitamin B12 deficiency anemia, unspecified: Secondary | ICD-10-CM

## 2014-01-13 DIAGNOSIS — N189 Chronic kidney disease, unspecified: Secondary | ICD-10-CM

## 2014-01-13 DIAGNOSIS — D638 Anemia in other chronic diseases classified elsewhere: Secondary | ICD-10-CM

## 2014-01-13 DIAGNOSIS — D631 Anemia in chronic kidney disease: Secondary | ICD-10-CM

## 2014-01-13 LAB — CBC WITH DIFFERENTIAL/PLATELET
BASO%: 0.6 % (ref 0.0–2.0)
Basophils Absolute: 0 10*3/uL (ref 0.0–0.1)
EOS%: 3.3 % (ref 0.0–7.0)
Eosinophils Absolute: 0.2 10*3/uL (ref 0.0–0.5)
HCT: 27.3 % — ABNORMAL LOW (ref 34.8–46.6)
HGB: 8.6 g/dL — ABNORMAL LOW (ref 11.6–15.9)
LYMPH%: 30 % (ref 14.0–49.7)
MCH: 29.8 pg (ref 25.1–34.0)
MCHC: 31.5 g/dL (ref 31.5–36.0)
MCV: 94.5 fL (ref 79.5–101.0)
MONO#: 0.4 10*3/uL (ref 0.1–0.9)
MONO%: 8.4 % (ref 0.0–14.0)
NEUT#: 2.9 10*3/uL (ref 1.5–6.5)
NEUT%: 57.7 % (ref 38.4–76.8)
Platelets: 86 10*3/uL — ABNORMAL LOW (ref 145–400)
RBC: 2.89 10*6/uL — ABNORMAL LOW (ref 3.70–5.45)
RDW: 12.9 % (ref 11.2–14.5)
WBC: 5.1 10*3/uL (ref 3.9–10.3)
lymph#: 1.5 10*3/uL (ref 0.9–3.3)

## 2014-01-13 MED ORDER — DARBEPOETIN ALFA-POLYSORBATE 500 MCG/ML IJ SOLN
300.0000 ug | Freq: Once | INTRAMUSCULAR | Status: DC
  Administered 2014-01-13: 300 ug via SUBCUTANEOUS

## 2014-01-13 MED ORDER — DARBEPOETIN ALFA 300 MCG/0.6ML IJ SOSY
300.0000 ug | PREFILLED_SYRINGE | Freq: Once | INTRAMUSCULAR | Status: DC
  Filled 2014-01-13: qty 0.6

## 2014-01-13 NOTE — Patient Instructions (Signed)
Darbepoetin Alfa injection What is this medicine? DARBEPOETIN ALFA (dar be POE e tin AL fa) helps your body make more red blood cells. It is used to treat anemia caused by chronic kidney failure and chemotherapy. This medicine may be used for other purposes; ask your health care provider or pharmacist if you have questions. COMMON BRAND NAME(S): Aranesp What should I tell my health care provider before I take this medicine? They need to know if you have any of these conditions: -blood clotting disorders or history of blood clots -cancer patient not on chemotherapy -cystic fibrosis -heart disease, such as angina, heart failure, or a history of a heart attack -hemoglobin level of 12 g/dL or greater -high blood pressure -low levels of folate, iron, or vitamin B12 -seizures -an unusual or allergic reaction to darbepoetin, erythropoietin, albumin, hamster proteins, latex, other medicines, foods, dyes, or preservatives -pregnant or trying to get pregnant -breast-feeding How should I use this medicine? This medicine is for injection into a vein or under the skin. It is usually given by a health care professional in a hospital or clinic setting. If you get this medicine at home, you will be taught how to prepare and give this medicine. Do not shake the solution before you withdraw a dose. Use exactly as directed. Take your medicine at regular intervals. Do not take your medicine more often than directed. It is important that you put your used needles and syringes in a special sharps container. Do not put them in a trash can. If you do not have a sharps container, call your pharmacist or healthcare provider to get one. Talk to your pediatrician regarding the use of this medicine in children. While this medicine may be used in children as young as 1 year for selected conditions, precautions do apply. Overdosage: If you think you have taken too much of this medicine contact a poison control center or  emergency room at once. NOTE: This medicine is only for you. Do not share this medicine with others. What if I miss a dose? If you miss a dose, take it as soon as you can. If it is almost time for your next dose, take only that dose. Do not take double or extra doses. What may interact with this medicine? Do not take this medicine with any of the following medications: -epoetin alfa This list may not describe all possible interactions. Give your health care provider a list of all the medicines, herbs, non-prescription drugs, or dietary supplements you use. Also tell them if you smoke, drink alcohol, or use illegal drugs. Some items may interact with your medicine. What should I watch for while using this medicine? Visit your prescriber or health care professional for regular checks on your progress and for the needed blood tests and blood pressure measurements. It is especially important for the doctor to make sure your hemoglobin level is in the desired range, to limit the risk of potential side effects and to give you the best benefit. Keep all appointments for any recommended tests. Check your blood pressure as directed. Ask your doctor what your blood pressure should be and when you should contact him or her. As your body makes more red blood cells, you may need to take iron, folic acid, or vitamin B supplements. Ask your doctor or health care provider which products are right for you. If you have kidney disease continue dietary restrictions, even though this medication can make you feel better. Talk with your doctor or health   care professional about the foods you eat and the vitamins that you take. What side effects may I notice from receiving this medicine? Side effects that you should report to your doctor or health care professional as soon as possible: -allergic reactions like skin rash, itching or hives, swelling of the face, lips, or tongue -breathing problems -changes in vision -chest  pain -confusion, trouble speaking or understanding -feeling faint or lightheaded, falls -high blood pressure -muscle aches or pains -pain, swelling, warmth in the leg -rapid weight gain -severe headaches -sudden numbness or weakness of the face, arm or leg -trouble walking, dizziness, loss of balance or coordination -seizures (convulsions) -swelling of the ankles, feet, hands -unusually weak or tired Side effects that usually do not require medical attention (report to your doctor or health care professional if they continue or are bothersome): -diarrhea -fever, chills (flu-like symptoms) -headaches -nausea, vomiting -redness, stinging, or swelling at site where injected This list may not describe all possible side effects. Call your doctor for medical advice about side effects. You may report side effects to FDA at 1-800-FDA-1088. Where should I keep my medicine? Keep out of the reach of children. Store in a refrigerator between 2 and 8 degrees C (36 and 46 degrees F). Do not freeze. Do not shake. Throw away any unused portion if using a single-dose vial. Throw away any unused medicine after the expiration date. NOTE: This sheet is a summary. It may not cover all possible information. If you have questions about this medicine, talk to your doctor, pharmacist, or health care provider.  2015, [employer]. (2008-01-26 10:23:57)  

## 2014-01-13 NOTE — Addendum Note (Signed)
Addended by: Elwanda BrooklynNEWSOME, Delylah Stanczyk K on: 01/13/2014 11:40 AM   Modules accepted: Orders

## 2014-02-12 ENCOUNTER — Encounter (HOSPITAL_COMMUNITY): Payer: Self-pay

## 2014-02-12 ENCOUNTER — Inpatient Hospital Stay (HOSPITAL_COMMUNITY)
Admission: EM | Admit: 2014-02-12 | Discharge: 2014-02-15 | DRG: 291 | Disposition: A | Discharge status: Home Health | Payer: Medicare Other | Attending: Internal Medicine | Admitting: Internal Medicine

## 2014-02-12 ENCOUNTER — Emergency Department (HOSPITAL_COMMUNITY): Payer: Medicare Other

## 2014-02-12 DIAGNOSIS — Z681 Body mass index (BMI) 19 or less, adult: Secondary | ICD-10-CM | POA: Diagnosis not present

## 2014-02-12 DIAGNOSIS — F329 Major depressive disorder, single episode, unspecified: Secondary | ICD-10-CM | POA: Diagnosis present

## 2014-02-12 DIAGNOSIS — N390 Urinary tract infection, site not specified: Secondary | ICD-10-CM | POA: Diagnosis present

## 2014-02-12 DIAGNOSIS — E43 Unspecified severe protein-calorie malnutrition: Secondary | ICD-10-CM | POA: Diagnosis present

## 2014-02-12 DIAGNOSIS — Z961 Presence of intraocular lens: Secondary | ICD-10-CM | POA: Diagnosis present

## 2014-02-12 DIAGNOSIS — N179 Acute kidney failure, unspecified: Secondary | ICD-10-CM | POA: Diagnosis present

## 2014-02-12 DIAGNOSIS — F419 Anxiety disorder, unspecified: Secondary | ICD-10-CM | POA: Diagnosis present

## 2014-02-12 DIAGNOSIS — I5033 Acute on chronic diastolic (congestive) heart failure: Secondary | ICD-10-CM | POA: Diagnosis present

## 2014-02-12 DIAGNOSIS — Z96642 Presence of left artificial hip joint: Secondary | ICD-10-CM | POA: Diagnosis present

## 2014-02-12 DIAGNOSIS — E785 Hyperlipidemia, unspecified: Secondary | ICD-10-CM | POA: Diagnosis present

## 2014-02-12 DIAGNOSIS — I509 Heart failure, unspecified: Secondary | ICD-10-CM

## 2014-02-12 DIAGNOSIS — F0390 Unspecified dementia without behavioral disturbance: Secondary | ICD-10-CM | POA: Diagnosis present

## 2014-02-12 DIAGNOSIS — J189 Pneumonia, unspecified organism: Secondary | ICD-10-CM | POA: Diagnosis present

## 2014-02-12 DIAGNOSIS — Z79899 Other long term (current) drug therapy: Secondary | ICD-10-CM | POA: Diagnosis not present

## 2014-02-12 DIAGNOSIS — E119 Type 2 diabetes mellitus without complications: Secondary | ICD-10-CM | POA: Diagnosis present

## 2014-02-12 DIAGNOSIS — Z9841 Cataract extraction status, right eye: Secondary | ICD-10-CM | POA: Diagnosis not present

## 2014-02-12 DIAGNOSIS — N186 End stage renal disease: Secondary | ICD-10-CM | POA: Diagnosis present

## 2014-02-12 DIAGNOSIS — I120 Hypertensive chronic kidney disease with stage 5 chronic kidney disease or end stage renal disease: Secondary | ICD-10-CM | POA: Diagnosis present

## 2014-02-12 DIAGNOSIS — Z9842 Cataract extraction status, left eye: Secondary | ICD-10-CM | POA: Diagnosis not present

## 2014-02-12 DIAGNOSIS — T502X5A Adverse effect of carbonic-anhydrase inhibitors, benzothiadiazides and other diuretics, initial encounter: Secondary | ICD-10-CM | POA: Diagnosis present

## 2014-02-12 DIAGNOSIS — R64 Cachexia: Secondary | ICD-10-CM | POA: Diagnosis present

## 2014-02-12 DIAGNOSIS — M1990 Unspecified osteoarthritis, unspecified site: Secondary | ICD-10-CM | POA: Diagnosis present

## 2014-02-12 DIAGNOSIS — R059 Cough, unspecified: Secondary | ICD-10-CM

## 2014-02-12 DIAGNOSIS — B9620 Unspecified Escherichia coli [E. coli] as the cause of diseases classified elsewhere: Secondary | ICD-10-CM | POA: Diagnosis present

## 2014-02-12 DIAGNOSIS — R410 Disorientation, unspecified: Secondary | ICD-10-CM | POA: Diagnosis present

## 2014-02-12 DIAGNOSIS — J9600 Acute respiratory failure, unspecified whether with hypoxia or hypercapnia: Secondary | ICD-10-CM | POA: Diagnosis present

## 2014-02-12 DIAGNOSIS — R05 Cough: Secondary | ICD-10-CM

## 2014-02-12 DIAGNOSIS — R251 Tremor, unspecified: Secondary | ICD-10-CM | POA: Diagnosis present

## 2014-02-12 DIAGNOSIS — R636 Underweight: Secondary | ICD-10-CM | POA: Diagnosis present

## 2014-02-12 DIAGNOSIS — R569 Unspecified convulsions: Secondary | ICD-10-CM

## 2014-02-12 LAB — BASIC METABOLIC PANEL
Anion gap: 14 (ref 5–15)
BUN: 23 mg/dL (ref 6–23)
CO2: 26 mEq/L (ref 19–32)
Calcium: 8.9 mg/dL (ref 8.4–10.5)
Chloride: 102 mEq/L (ref 96–112)
Creatinine, Ser: 1.58 mg/dL — ABNORMAL HIGH (ref 0.50–1.10)
GFR calc Af Amer: 35 mL/min — ABNORMAL LOW (ref >90)
GFR calc non Af Amer: 30 mL/min — ABNORMAL LOW (ref >90)
Glucose, Bld: 120 mg/dL — ABNORMAL HIGH (ref 70–99)
Potassium: 4.1 mEq/L (ref 3.7–5.3)
Sodium: 142 mEq/L (ref 137–147)

## 2014-02-12 LAB — URINALYSIS, ROUTINE W REFLEX MICROSCOPIC
Bilirubin Urine: NEGATIVE
Glucose, UA: NEGATIVE mg/dL
Ketones, ur: NEGATIVE mg/dL
Nitrite: POSITIVE — AB
Protein, ur: 100 mg/dL — AB
Specific Gravity, Urine: 1.025 (ref 1.005–1.030)
Urobilinogen, UA: 0.2 mg/dL (ref 0.0–1.0)
pH: 6.5 (ref 5.0–8.0)

## 2014-02-12 LAB — CBC WITH DIFFERENTIAL/PLATELET
Basophils Absolute: 0 10*3/uL (ref 0.0–0.1)
Basophils Relative: 1 % (ref 0–1)
Eosinophils Absolute: 0 10*3/uL (ref 0.0–0.7)
Eosinophils Relative: 0 % (ref 0–5)
HCT: 33.9 % — ABNORMAL LOW (ref 36.0–46.0)
Hemoglobin: 10.4 g/dL — ABNORMAL LOW (ref 12.0–15.0)
Lymphocytes Relative: 14 % (ref 12–46)
Lymphs Abs: 1.2 10*3/uL (ref 0.7–4.0)
MCH: 29.1 pg (ref 26.0–34.0)
MCHC: 30.7 g/dL (ref 30.0–36.0)
MCV: 94.7 fL (ref 78.0–100.0)
Monocytes Absolute: 0.6 10*3/uL (ref 0.1–1.0)
Monocytes Relative: 7 % (ref 3–12)
Neutro Abs: 6.7 10*3/uL (ref 1.7–7.7)
Neutrophils Relative %: 78 % — ABNORMAL HIGH (ref 43–77)
Platelets: 148 10*3/uL — ABNORMAL LOW (ref 150–400)
RBC: 3.58 MIL/uL — ABNORMAL LOW (ref 3.87–5.11)
RDW: 13.6 % (ref 11.5–15.5)
WBC: 8.5 10*3/uL (ref 4.0–10.5)

## 2014-02-12 LAB — PRO B NATRIURETIC PEPTIDE: Pro B Natriuretic peptide (BNP): 14197 pg/mL — ABNORMAL HIGH (ref 0–450)

## 2014-02-12 LAB — URINE MICROSCOPIC-ADD ON

## 2014-02-12 LAB — CBG MONITORING, ED: Glucose-Capillary: 111 mg/dL — ABNORMAL HIGH (ref 70–99)

## 2014-02-12 LAB — GLUCOSE, CAPILLARY
GLUCOSE-CAPILLARY: 130 mg/dL — AB (ref 70–99)
GLUCOSE-CAPILLARY: 106 mg/dL — AB (ref 70–99)

## 2014-02-12 MED ORDER — ACETAMINOPHEN 325 MG PO TABS
650.0000 mg | ORAL_TABLET | Freq: Once | ORAL | Status: AC
  Administered 2014-02-12: 650 mg via ORAL
  Filled 2014-02-12: qty 2

## 2014-02-12 MED ORDER — CEFTRIAXONE SODIUM IN DEXTROSE 20 MG/ML IV SOLN
1.0000 g | INTRAVENOUS | Status: DC
  Administered 2014-02-13 – 2014-02-15 (×3): 1 g via INTRAVENOUS
  Filled 2014-02-12 (×3): qty 50

## 2014-02-12 MED ORDER — ENSURE COMPLETE PO LIQD
237.0000 mL | Freq: Two times a day (BID) | ORAL | Status: DC
  Administered 2014-02-13 – 2014-02-15 (×6): 237 mL via ORAL

## 2014-02-12 MED ORDER — DEXTROSE 5 % IV SOLN
500.0000 mg | INTRAVENOUS | Status: DC
  Administered 2014-02-13 – 2014-02-15 (×3): 500 mg via INTRAVENOUS
  Filled 2014-02-12 (×3): qty 500

## 2014-02-12 MED ORDER — ATORVASTATIN CALCIUM 20 MG PO TABS
20.0000 mg | ORAL_TABLET | Freq: Every day | ORAL | Status: DC
  Administered 2014-02-12 – 2014-02-14 (×3): 20 mg via ORAL
  Filled 2014-02-12 (×4): qty 1

## 2014-02-12 MED ORDER — ACETAMINOPHEN 325 MG PO TABS: 650.0000 mg | ORAL_TABLET | Freq: Four times a day (QID) | ORAL | Status: DC | PRN

## 2014-02-12 MED ORDER — FUROSEMIDE 10 MG/ML IJ SOLN
20.0000 mg | Freq: Two times a day (BID) | INTRAMUSCULAR | Status: DC
  Administered 2014-02-12 – 2014-02-14 (×4): 20 mg via INTRAVENOUS
  Filled 2014-02-12 (×6): qty 2

## 2014-02-12 MED ORDER — TIZANIDINE HCL 4 MG PO TABS
4.0000 mg | ORAL_TABLET | Freq: Four times a day (QID) | ORAL | Status: DC | PRN
  Filled 2014-02-12: qty 1

## 2014-02-12 MED ORDER — HYDROCODONE-ACETAMINOPHEN 5-325 MG PO TABS
2.0000 | ORAL_TABLET | Freq: Four times a day (QID) | ORAL | Status: DC | PRN
Start: 2014-02-12 — End: 2014-02-15

## 2014-02-12 MED ORDER — OXYBUTYNIN CHLORIDE ER 5 MG PO TB24
5.0000 mg | ORAL_TABLET | Freq: Every day | ORAL | Status: DC
  Administered 2014-02-12 – 2014-02-15 (×4): 5 mg via ORAL
  Filled 2014-02-12 (×4): qty 1

## 2014-02-12 MED ORDER — HEPARIN SODIUM (PORCINE) 5000 UNIT/ML IJ SOLN
5000.0000 [IU] | Freq: Three times a day (TID) | INTRAMUSCULAR | Status: DC
  Administered 2014-02-12 – 2014-02-15 (×9): 5000 [IU] via SUBCUTANEOUS
  Filled 2014-02-12 (×11): qty 1

## 2014-02-12 MED ORDER — ATENOLOL 50 MG PO TABS
50.0000 mg | ORAL_TABLET | Freq: Two times a day (BID) | ORAL | Status: DC
  Administered 2014-02-12 – 2014-02-13 (×4): 50 mg via ORAL
  Filled 2014-02-12 (×6): qty 1

## 2014-02-12 MED ORDER — DEXTROSE 5 % IV SOLN
1.0000 g | Freq: Once | INTRAVENOUS | Status: AC
  Administered 2014-02-12: 1 g via INTRAVENOUS
  Filled 2014-02-12: qty 10

## 2014-02-12 MED ORDER — MIRTAZAPINE 15 MG PO TABS
15.0000 mg | ORAL_TABLET | Freq: Every day | ORAL | Status: DC
  Administered 2014-02-12 – 2014-02-15 (×4): 15 mg via ORAL
  Filled 2014-02-12 (×4): qty 1

## 2014-02-12 MED ORDER — VITAMIN D3 25 MCG (1000 UNIT) PO TABS
1000.0000 [IU] | ORAL_TABLET | Freq: Every day | ORAL | Status: DC
  Administered 2014-02-12 – 2014-02-15 (×4): 1000 [IU] via ORAL
  Filled 2014-02-12 (×4): qty 1

## 2014-02-12 MED ORDER — ALBUTEROL SULFATE (2.5 MG/3ML) 0.083% IN NEBU: 2.5000 mg | INHALATION_SOLUTION | RESPIRATORY_TRACT | Status: DC | PRN

## 2014-02-12 MED ORDER — LEVETIRACETAM 250 MG PO TABS
250.0000 mg | ORAL_TABLET | Freq: Two times a day (BID) | ORAL | Status: DC
  Administered 2014-02-12 – 2014-02-15 (×6): 250 mg via ORAL
  Filled 2014-02-12 (×7): qty 1

## 2014-02-12 MED ORDER — DEXTROSE 5 % IV SOLN
500.0000 mg | Freq: Once | INTRAVENOUS | Status: AC
  Administered 2014-02-12: 500 mg via INTRAVENOUS
  Filled 2014-02-12: qty 500

## 2014-02-12 MED ORDER — MEGESTROL ACETATE 40 MG PO TABS: 40.0000 mg | ORAL_TABLET | Freq: Every day | ORAL | Status: DC

## 2014-02-12 MED ORDER — CLONIDINE HCL 0.1 MG PO TABS
0.1000 mg | ORAL_TABLET | Freq: Two times a day (BID) | ORAL | Status: DC
  Administered 2014-02-12 – 2014-02-13 (×4): 0.1 mg via ORAL
  Filled 2014-02-12 (×6): qty 1

## 2014-02-12 MED ORDER — VITAMIN B-12 1000 MCG PO TABS
1000.0000 ug | ORAL_TABLET | Freq: Every day | ORAL | Status: DC
  Administered 2014-02-12 – 2014-02-15 (×4): 1000 ug via ORAL
  Filled 2014-02-12 (×4): qty 1

## 2014-02-12 MED ORDER — SODIUM CHLORIDE 0.9 % IJ SOLN
3.0000 mL | Freq: Two times a day (BID) | INTRAMUSCULAR | Status: DC
  Administered 2014-02-12 – 2014-02-15 (×7): 3 mL via INTRAVENOUS

## 2014-02-12 MED ORDER — DOCUSATE SODIUM 100 MG PO CAPS
100.0000 mg | ORAL_CAPSULE | Freq: Every day | ORAL | Status: DC | PRN
  Filled 2014-02-12: qty 1

## 2014-02-12 MED ORDER — FUROSEMIDE 10 MG/ML IJ SOLN
30.0000 mg | Freq: Once | INTRAMUSCULAR | Status: AC
  Administered 2014-02-12: 30 mg via INTRAVENOUS
  Filled 2014-02-12: qty 4

## 2014-02-12 MED ORDER — DOCUSATE SODIUM 100 MG PO CAPS
100.0000 mg | ORAL_CAPSULE | Freq: Two times a day (BID) | ORAL | Status: DC
  Administered 2014-02-12 – 2014-02-13 (×2): 100 mg via ORAL
  Filled 2014-02-12 (×4): qty 1

## 2014-02-12 MED ORDER — SENNA 8.6 MG PO TABS
2.0000 | ORAL_TABLET | Freq: Two times a day (BID) | ORAL | Status: DC
  Administered 2014-02-12: 8.6 mg via ORAL
  Administered 2014-02-13 – 2014-02-15 (×5): 17.2 mg via ORAL
  Filled 2014-02-12 (×7): qty 2

## 2014-02-12 MED ORDER — ONDANSETRON HCL 4 MG/2ML IJ SOLN: 4.0000 mg | Freq: Four times a day (QID) | INTRAMUSCULAR | Status: DC | PRN

## 2014-02-12 MED ORDER — ONDANSETRON HCL 4 MG PO TABS: 4.0000 mg | ORAL_TABLET | Freq: Four times a day (QID) | ORAL | Status: DC | PRN

## 2014-02-12 MED ORDER — SODIUM POLYSTYRENE SULFONATE 15 GM/60ML PO SUSP
15.0000 g | ORAL | Status: DC
  Filled 2014-02-12: qty 60

## 2014-02-12 NOTE — ED Notes (Signed)
Pt resting, no needs at this time; family has stepped out for a moment but will return later

## 2014-02-12 NOTE — H&P (Signed)
Patient Demographics  [name], is a 80 y.o. female  MRN: [id number]   DOB - 09/14/1932  Admit Date - 02/12/2014  Outpatient Primary MD for the patient is [name],[name], MD   With History of -  Past Medical History  Diagnosis Date  . Hypertension   . Anxiety and depression   . Chronic headaches   . Diverticular disease   . Anemia   . Arthritis     osteoarthritis  . Osteoporosis   . Vitamin B 12 deficiency   . History of ITP   . Anxiety     takes xanax for sleep  . Diabetes mellitus     NIDDM x 12 years  . Heart murmur     benign;asymptomatic  . Chronic kidney disease     ESRD - AV fistula in LUE (Dr. Carlean Jewsynrthia Dunham = nephrologist)  . Left knee pain 02/08/2013  . Cachexia 02/08/2013  . Hyperlipidemia   . Tremors of nervous system   . Constipated 09/30/2013      Past Surgical History  Procedure Laterality Date  . Av fistula placement  11/15/10    left upper arm AVF  . Abdominal hysterectomy  1973  . Joint replacement  2007    rt hip  . Eye surgery    . Cataract extraction w/ intraocular lens  implant, bilateral  2002  . Vascular surgery  10-2010    left AVF  . Hernia repair  2000    ventral  . Ventral hernia repair  08/06/2011    Procedure: HERNIA REPAIR VENTRAL ADULT;  Surgeon: Liz MaladyBurke E Thompson, MD;  [facility];  Service: General;  Laterality: N/A;  Open Repair [employer] Hernia with Mesh  . Cholecystectomy  yrs ago  . Total hip arthroplasty Left 04/27/2013    Procedure: LEFT TOTAL HIP ARTHROPLASTY ANTERIOR APPROACH;  Surgeon: Shelda PalMatthew D Olin, MD;  Location: [facility] ORS;  Service: Orthopedics;  Laterality: Left;    in for   Chief Complaint  Patient presents with  . Tremors     HPI  [name]  is a 80 y.o. female, with past medical history of hypertension, depression, anxiety, vitamin B-12 deficiency, NIDDM, chronic kidney disease with AV fistula, anorexia, hyperlipidemia, ambulates minimally with wheelchair, occasional walker for short  distance at home, was brought by her daughter secondary to tremors, patient lives with her son at home, visited by daughter every day, she was noticed to be weak, shivering, shaking, patient had fever in ED 100.4 , patient's vital signs were acceptable in ED, no hypoxia, no tachycardia, no hypertension, his x-ray showing vascular congestion picture, with elevated BNP, as well patient had questionable opacity/infiltrate, urinalysis was positive, agent was given IV Lasix, for possible CHF, no echo done in the past, as well started on IV Rocephin and azithromycin for community-acquired pneumonia and UTI.    Review of Systems    In addition to the HPI above,   Was not able to obtain reliable review of system giving patient dementia.  Social History History  Substance Use Topics  . Smoking status: Never Smoker   . Smokeless tobacco: Current User    Types: Snuff  . Alcohol Use: No    Family History Family History  Problem Relation Age of Onset  . Stroke Mother   . Heart disease Father      Prior to Admission medications   Medication Sig Start Date End Date Taking? Authorizing Provider  acetaminophen (TYLENOL) 325 MG tablet  Take 650 mg by mouth every 6 (six) hours as needed for headache.   Yes Historical Provider, MD  atenolol (TENORMIN) 50 MG tablet Take 50 mg by mouth 2 (two) times daily.   Yes Historical Provider, MD  atorvastatin (LIPITOR) 20 MG tablet Take 20 mg by mouth at bedtime. daily 01/08/12  Yes Historical Provider, MD  Cholecalciferol (VITAMIN D-3) 1000 UNITS CAPS Take 1,000 Units by mouth daily.   Yes Historical Provider, MD  cloNIDine (CATAPRES) 0.1 MG tablet Take 0.1 mg by mouth 2 (two) times daily.    Yes Historical Provider, MD  [employer] VITAMIN B12 1000 MCG tablet TAKE 1 TABLET (1,000 MCG TOTAL) BY MOUTH DAILY. 08/22/13  Yes Artis Delay, MD  docusate sodium (COLACE) 100 MG capsule Take 100 mg by mouth daily as needed for mild constipation.   Yes Historical Provider, MD    furosemide (LASIX) 20 MG tablet Take 20 mg by mouth daily.    Yes Historical Provider, MD  levETIRAcetam (KEPPRA) 250 MG tablet TAKE 1 TABLET BY MOUTH TWICE A DAY   Yes Delia Heady, MD  megestrol (MEGACE) 40 MG tablet TAKE 1 TABLET (40 MG TOTAL) BY MOUTH 2 (TWO) TIMES DAILY. 07/22/13  Yes Artis Delay, MD  mirtazapine (REMERON) 15 MG tablet Take 15 mg by mouth daily. 09/20/13  Yes Historical Provider, MD  oxybutynin (DITROPAN-XL) 5 MG 24 hr tablet Take 5 mg by mouth daily.   Yes Historical Provider, MD  senna (SENOKOT) 8.6 MG TABS tablet Take 2 tablets (17.2 mg total) by mouth 2 (two) times daily. 09/30/13  Yes Artis Delay, MD  sodium polystyrene (KAYEXALATE) 15 GM/60ML suspension Take 15 g by mouth 3 (three) times a week. Monday, Wednesday and Friday   Yes Historical Provider, MD  HYDROcodone-acetaminophen (NORCO/VICODIN) 5-325 MG per tablet Take 2 tablets by mouth every 6 (six) hours as needed for moderate pain.    Historical Provider, MD  tiZANidine (ZANAFLEX) 4 MG tablet Take 1 tablet (4 mg total) by mouth every 6 (six) hours as needed for muscle spasms. Patient not taking: Reported on 02/12/2014 04/29/13   Genelle Gather Babish, PA-C    No Known Allergies  Physical Exam  Vitals  Blood pressure 161/69, pulse 84, temperature 98.4 F (36.9 C), temperature source Oral, resp. rate 20, SpO2 93 %.   1. General frail ill-appearing elderly female lying in bed in NAD,    2. Patient is awake, alert, open eyes to verbal stimuli, but minimally communicative, does not follow command   3. The neurological exam was limited due to poor compliance, but appears to be grossly moving all extremities.  4. Ears and Eyes appear Normal, Conjunctivae clear, PERRLA. Dry Oral Mucosa.  5. Supple Neck, No JVD, No cervical lymphadenopathy appriciated, No Carotid Bruits.  6. Symmetrical Chest wall movement, Good air movement bilaterally, no wheezing.  7. RRR, No Gallops, Rubs or Murmurs, No Parasternal Heave.  8.  Positive Bowel Sounds, Abdomen Soft, No tenderness, No organomegaly appriciated,No rebound -guarding or rigidity.  9.  No Cyanosis, Normal Skin Turgor, has skin tear in the right lower extremity  10. Good muscle tone,  joints appear normal , no effusions, +1 bilateral lower extremity edema  11. No Palpable Lymph Nodes in Neck or Axillae    Data Review  CBC  Recent Labs Lab 02/12/14 0851  WBC 8.5  HGB 10.4*  HCT 33.9*  PLT 148*  MCV 94.7  MCH 29.1  MCHC 30.7  RDW 13.6  LYMPHSABS 1.2  MONOABS  0.6  EOSABS 0.0  BASOSABS 0.0   ------------------------------------------------------------------------------------------------------------------  Chemistries   Recent Labs Lab 02/12/14 0851  NA 142  K 4.1  CL 102  CO2 26  GLUCOSE 120*  BUN 23  CREATININE 1.58*  CALCIUM 8.9   ------------------------------------------------------------------------------------------------------------------ CrCl cannot be calculated (Unknown ideal weight.). ------------------------------------------------------------------------------------------------------------------ No results for input(s): TSH, T4TOTAL, T3FREE, THYROIDAB in the last 72 hours.  Invalid input(s): FREET3   Coagulation profile No results for input(s): INR, PROTIME in the last 168 hours. ------------------------------------------------------------------------------------------------------------------- No results for input(s): DDIMER in the last 72 hours. -------------------------------------------------------------------------------------------------------------------  Cardiac Enzymes No results for input(s): CKMB, TROPONINI, MYOGLOBIN in the last 168 hours.  Invalid input(s): CK ------------------------------------------------------------------------------------------------------------------ Invalid input(s):  POCBNP   ---------------------------------------------------------------------------------------------------------------  Urinalysis    Component Value Date/Time   COLORURINE YELLOW 02/12/2014 0941   APPEARANCEUR CLOUDY* 02/12/2014 0941   LABSPEC 1.025 02/12/2014 0941   PHURINE 6.5 02/12/2014 0941   GLUCOSEU NEGATIVE 02/12/2014 0941   HGBUR MODERATE* 02/12/2014 0941   BILIRUBINUR NEGATIVE 02/12/2014 0941   KETONESUR NEGATIVE 02/12/2014 0941   PROTEINUR 100* 02/12/2014 0941   UROBILINOGEN 0.2 02/12/2014 0941   NITRITE POSITIVE* 02/12/2014 0941   LEUKOCYTESUR TRACE* 02/12/2014 0941    ----------------------------------------------------------------------------------------------------------------  Imaging results:   Dg Chest Port 1 View  02/12/2014   CLINICAL DATA:  One day history of productive cough  EXAM: PORTABLE CHEST - 1 VIEW  COMPARISON:  April 30, 2013  FINDINGS: There is cardiomegaly with pulmonary venous hypertension. There is generalized interstitial edema. There is consolidation in both lung bases with small left effusion. No adenopathy. No bone lesions.  IMPRESSION: Evidence of congestive heart failure. Question alveolar edema versus pneumonia in the lower lobes. Both entities may exist concurrently. Small left effusion present.   Electronically Signed   By: Bretta BangWilliam  Woodruff M.D.   On: 02/12/2014 09:20    My personal review of EKG: Rhythm NSR, Rate  95 /min, QTc 467 , no Acute ST changes    Assessment & Plan  Active Problems:   UTI (lower urinary tract infection)   PNA (pneumonia)   CHF (congestive heart failure)   Seizure, convulsive   Cachexia   HLD (hyperlipidemia)   Underweight    CHF -Patient has evidence of volume overload on chest x-ray, and elevated BNP, no echo done in the past, will admit to telemetry, check 2-D echo, start on low dose Lasix, strict ins and outs and daily weight.  Pneumonia  -Chest x-ray showing possible pneumonia and lower  lobes, start on Rocephin and azithromycin for community-acquired pneumonia.  UTI -Continue with Rocephin  Underweight/cachexia -Continue with Remeron and Megace, will consult nutritional service, will start Ensure.  History of seizures - Continue with Keppra  Hyperlipidemia -Continue with statin.   DVT Prophylaxis Heparin   AM Labs Ordered, also please review Full Orders  Family Communication: Admission, patients condition and plan of care including tests being ordered have been discussed patient's daughter. who indicate understanding and agree with the plan and Code Status.  Code Status full  Disposition: Admit to telemetry  Condition GUARDED    Time spent in minutes : 60 minutes    Joda Braatz M.D on 02/12/2014 at 11:43 AM  Between 7am to 7pm - Pager - [phone]  After 7pm go to [email or web address] - password [email or web address]  And look for the night coverage person covering me after hours  [facility] Office  [phone]   **Disclaimer: This note may have been dictated with voice recognition software. Similar sounding  words can inadvertently be transcribed and this note may contain transcription errors which may not have been corrected upon publication of note.**

## 2014-02-12 NOTE — ED Provider Notes (Signed)
CSN: [id number]     Arrival date & time 02/12/14  0810 History   First MD Initiated Contact with Patient 02/12/14 0818     Chief Complaint  Patient presents with  . Tremors    Level 5 Caveat- pt has dementia, limited verbal communication  (Consider location/radiation/quality/duration/timing/severity/associated sxs/prior Treatment) Patient is a 80 y.o. female presenting with seizures. The history is provided by a relative and medical records. No language interpreter was used.  Seizures Pt is a 80yo female with hx of HTN, anxiety and depression, anemia, vitamin B 12 deficiency, NIDDM, ESRD with AV fistula in LUE, cachexia, hyperlipidemia, and tremors brought to ED by daughter who is her care giver after she found pt "shivering" shaking all over in bed this morning. Pt was found in bed w/o covers this morning so daughter initially thought shivering was due to pt being cold, however, she has not been able to warm the pt up.  Daughter reports pt normally has a tremor but after she is given her oxybutynin, the tremor typically improves.  Daughter also noticed pt is not talking as clear as she normally does but states pt is baseline mental status.  [name] concerned the pt may have a fever.  Daughter noticed a mild cough with clear sputum that started today. Daughter also notes, pt has a skin tear on right lower leg that keeps getting "bumped" and opening back up.  No measured temperature. No n/v/d.         PCP: Dr. Marjory Lies, [facility]   Past Medical History  Diagnosis Date  . Hypertension   . Anxiety and depression   . Chronic headaches   . Diverticular disease   . Anemia   . Arthritis     osteoarthritis  . Osteoporosis   . Vitamin B 12 deficiency   . History of ITP   . Anxiety     takes xanax for sleep  . Diabetes mellitus     NIDDM x 12 years  . Heart murmur     benign;asymptomatic  . Chronic kidney disease     ESRD - AV fistula in LUE (Dr. Carlean Jews =  nephrologist)  . Left knee pain 02/08/2013  . Cachexia 02/08/2013  . Hyperlipidemia   . Tremors of nervous system   . Constipated 09/30/2013   Past Surgical History  Procedure Laterality Date  . Av fistula placement  11/15/10    left upper arm AVF  . Abdominal hysterectomy  1973  . Joint replacement  2007    rt hip  . Eye surgery    . Cataract extraction w/ intraocular lens  implant, bilateral  2002  . Vascular surgery  10-2010    left AVF  . Hernia repair  2000    ventral  . Ventral hernia repair  08/06/2011    Procedure: HERNIA REPAIR VENTRAL ADULT;  Surgeon: Liz Malady, MD;  Location: [facility] OR;  Service: General;  Laterality: N/A;  Open Repair [employer] Hernia with Mesh  . Cholecystectomy  yrs ago  . Total hip arthroplasty Left 04/27/2013    Procedure: LEFT TOTAL HIP ARTHROPLASTY ANTERIOR APPROACH;  Surgeon: Shelda Pal, MD;  Location: [facility] ORS;  Service: Orthopedics;  Laterality: Left;   Family History  Problem Relation Age of Onset  . Stroke Mother   . Heart disease Father    History  Substance Use Topics  . Smoking status: Never Smoker   . Smokeless tobacco: Current User  Types: Snuff  . Alcohol Use: No   OB History    No data available     Review of Systems  Unable to perform ROS: Dementia  Limited verbal comunication    Allergies  Review of patient's allergies indicates no known allergies.  Home Medications   Prior to Admission medications   Medication Sig Start Date End Date Taking? Authorizing Provider  acetaminophen (TYLENOL) 325 MG tablet Take 650 mg by mouth every 6 (six) hours as needed for headache.   Yes Historical Provider, MD  atenolol (TENORMIN) 50 MG tablet Take 50 mg by mouth 2 (two) times daily.   Yes Historical Provider, MD  atorvastatin (LIPITOR) 20 MG tablet Take 20 mg by mouth at bedtime. daily 01/08/12  Yes Historical Provider, MD  Cholecalciferol (VITAMIN D-3) 1000 UNITS CAPS Take 1,000 Units by mouth daily.   Yes Historical  Provider, MD  cloNIDine (CATAPRES) 0.1 MG tablet Take 0.1 mg by mouth 2 (two) times daily.    Yes Historical Provider, MD  [employer]S VITAMIN B12 1000 MCG tablet TAKE 1 TABLET (1,000 MCG TOTAL) BY MOUTH DAILY. 08/22/13  Yes Artis Delay, MD  docusate sodium (COLACE) 100 MG capsule Take 100 mg by mouth daily as needed for mild constipation.   Yes Historical Provider, MD  furosemide (LASIX) 20 MG tablet Take 20 mg by mouth daily.    Yes Historical Provider, MD  levETIRAcetam (KEPPRA) 250 MG tablet TAKE 1 TABLET BY MOUTH TWICE A DAY   Yes Delia Heady, MD  megestrol (MEGACE) 40 MG tablet TAKE 1 TABLET (40 MG TOTAL) BY MOUTH 2 (TWO) TIMES DAILY. 07/22/13  Yes Artis Delay, MD  mirtazapine (REMERON) 15 MG tablet Take 15 mg by mouth daily. 09/20/13  Yes Historical Provider, MD  oxybutynin (DITROPAN-XL) 5 MG 24 hr tablet Take 5 mg by mouth daily.   Yes Historical Provider, MD  senna (SENOKOT) 8.6 MG TABS tablet Take 2 tablets (17.2 mg total) by mouth 2 (two) times daily. 09/30/13  Yes Artis Delay, MD  sodium polystyrene (KAYEXALATE) 15 GM/60ML suspension Take 15 g by mouth 3 (three) times a week. Monday, Wednesday and Friday   Yes Historical Provider, MD  enoxaparin (LOVENOX) 30 MG/0.3ML injection Inject 0.3 mLs (30 mg total) into the skin daily. Patient not taking: Reported on 02/12/2014 04/29/13   Genelle Gather Babish, PA-C  HYDROcodone-acetaminophen (HYCET) 7.5-325 mg/15 ml solution Take 5 mLs by mouth every 6 (six) hours as needed for moderate pain. Patient not taking: Reported on 02/12/2014 04/30/13 04/30/14  Arman Filter, NP  HYDROcodone-acetaminophen (NORCO/VICODIN) 5-325 MG per tablet Take 2 tablets by mouth every 6 (six) hours as needed for moderate pain.    Historical Provider, MD  tiZANidine (ZANAFLEX) 4 MG tablet Take 1 tablet (4 mg total) by mouth every 6 (six) hours as needed for muscle spasms. Patient not taking: Reported on 02/12/2014 04/29/13   Genelle Gather Babish, PA-C   BP 176/77 mmHg  Pulse 89  Temp(Src)  98.4 F (36.9 C) (Oral)  Resp 26  SpO2 92% Physical Exam  Constitutional:  Thin elderly female lying in exam bed, essential tremor present, more pronounced in Right arm  HENT:  Head: Normocephalic and atraumatic.  Eyes: Conjunctivae are normal. No scleral icterus.  Neck: Normal range of motion.  Cardiovascular: Normal rate, regular rhythm and normal heart sounds.   Pulmonary/Chest: Effort normal and breath sounds normal. No respiratory distress. She has no wheezes. She has no rales. She exhibits no tenderness.  Abdominal: Soft.  Bowel sounds are normal. She exhibits no distension and no mass. There is no tenderness. There is no rebound and no guarding.  Soft, non-distended, non-tender.  Musculoskeletal: Normal range of motion.  Neurological: [name] alert.  Pt is alert, limited verbal communication.  Essential tremor present, worse in right arm. 5/5 grip strength bilaterally.   Skin: Skin is warm and dry. [name] not diaphoretic.  2x3 skin tear on right lower anterior leg. Appears acute. Scant red blood. No surrounding erythema, edema, or induration. No discharge.  Multiple ecchymosis of various ages on bilateral lower extremities.   Nursing note and vitals reviewed.   ED Course  Procedures (including critical care time) Labs Review Labs Reviewed  BASIC METABOLIC PANEL - Abnormal; Notable for the following:    Glucose, Bld 120 (*)    Creatinine, Ser 1.58 (*)    GFR calc non Af Amer 30 (*)    GFR calc Af Amer 35 (*)    All other components within normal limits  CBC WITH DIFFERENTIAL - Abnormal; Notable for the following:    RBC 3.58 (*)    Hemoglobin 10.4 (*)    HCT 33.9 (*)    Platelets 148 (*)    Neutrophils Relative % 78 (*)    All other components within normal limits  URINALYSIS, ROUTINE W REFLEX MICROSCOPIC - Abnormal; Notable for the following:    APPearance CLOUDY (*)    Hgb urine dipstick MODERATE (*)    Protein, ur 100 (*)    Nitrite POSITIVE (*)    Leukocytes, UA  TRACE (*)    All other components within normal limits  PRO B NATRIURETIC PEPTIDE - Abnormal; Notable for the following:    Pro B Natriuretic peptide (BNP) 14197.0 (*)    All other components within normal limits  URINE MICROSCOPIC-ADD ON - Abnormal; Notable for the following:    Bacteria, UA MANY (*)    All other components within normal limits  CBG MONITORING, ED - Abnormal; Notable for the following:    Glucose-Capillary 111 (*)    All other components within normal limits  URINE CULTURE    Imaging Review Dg Chest Port 1 View  02/12/2014   CLINICAL DATA:  One day history of productive cough  EXAM: PORTABLE CHEST - 1 VIEW  COMPARISON:  April 30, 2013  FINDINGS: There is cardiomegaly with pulmonary venous hypertension. There is generalized interstitial edema. There is consolidation in both lung bases with small left effusion. No adenopathy. No bone lesions.  IMPRESSION: Evidence of congestive heart failure. Question alveolar edema versus pneumonia in the lower lobes. Both entities may exist concurrently. Small left effusion present.   Electronically Signed   By: Bretta BangWilliam  Woodruff M.D.   On: 02/12/2014 09:20     EKG Interpretation   Date/Time:  Saturday February 12 2014 09:39:01 EST Ventricular Rate:  95 PR Interval:  186 QRS Duration: 100 QT Interval:  379 QTC Calculation: 476 R Axis:   -9 Text Interpretation:  Sinus rhythm Probable LVH with secondary repol abnrm  Sinus rhythm Left ventricular hypertrophy less artefact from prior study  Abnormal ekg Confirmed by Gerhard MunchLOCKWOOD, ROBERT  MD [phone]) on 02/12/2014  10:04:03 AM    MDM   Final diagnoses:  Cough  CAP (community acquired pneumonia)  UTI (lower urinary tract infection)    Pt is a 80yo female with h/o dementia brought to ED by daughter with concern for generalized shaking that did not resolve with pt's normal daily medication this morning.  Reports of productive cough with clear sputum that started this morning as well.    Vitals: rectal temp 100.4, elevated BP initially 177/94, elevated to 215/87. Pt has hx of HTN per daughter and has not had her BP medication this morning.  Pt given 650mg  acetaminophen for temp.   9:29 AM CXR: evidence of CHF, question alveolar edema vs pneumonia in lower lobes. Both entities may exist concurrently. Small left effusion present.   Will order BNP as pt has no prior hx of CHF.  Pt not c/o CP, doubt ACS Due to temp, increased concern for pneumonia. Discussed pt with Dr. Jeraldine LootsLockwood who also examined pt, will start pt on protocol antimitotics for CAP, azithromycin and rocephin. Will also give pt her home dose of clonidine 0.1mg  and Lasix 30mL as she takes 20mg  daily at home.    9:50 AM Discussed admission of pt to pt's son who agrees as he states he "figured she had pneumonia"  Pt's tremor has resolved since being in ED.  EKG redone, no afib present. BMP-mildly elevated Cr, however, good for pt as 41mo ago Cr was 2.5. BNP pending. UA pending. Will consult with [facility] to admit pt for tx of CAP.  PCP is Dr. Marj[facility]Brent Burnett [facility]. BP has improved to 190/81 while in ED.  10:39 AM UA: significant for positive nitrites, leukocytosis, many bacteria. Pt has already been started on IV rocephin for CAP, will also cover UTI  10:46 AM BNP-14197, elevated from 10767 nine months ago.    11:11 AM Consulted with Dr. Randol KernElgergawy, will admit pt to a med-surg bed. Temp orders placed.             Junius Finnerrin O'Malley, PA-C 02/12/14 1114  Gerhard Munchobert Lockwood, MD 02/12/14 [phone]

## 2014-02-12 NOTE — ED Notes (Addendum)
Pt. Presents to ED after daughter noticed she was shivering and not talking as clearly this AM. Family states pt has hx of "shaking" not generalized seizures. Pt. Family states pt. Is at baseline mental status.

## 2014-02-12 NOTE — ED Notes (Signed)
Myself and Claudia, EMT undressed pt, placed in gown, on monitor, continuous pulse oximetry and blood pressure cuff; warm blankets given by Arlys JohnBrian, RN; Denny PeonErin, PA present in room; family at bedside

## 2014-02-13 DIAGNOSIS — R636 Underweight: Secondary | ICD-10-CM

## 2014-02-13 DIAGNOSIS — J9600 Acute respiratory failure, unspecified whether with hypoxia or hypercapnia: Secondary | ICD-10-CM

## 2014-02-13 DIAGNOSIS — R569 Unspecified convulsions: Secondary | ICD-10-CM

## 2014-02-13 LAB — CBC
HCT: 29.4 % — ABNORMAL LOW (ref 36.0–46.0)
Hemoglobin: 9.4 g/dL — ABNORMAL LOW (ref 12.0–15.0)
MCH: 30.9 pg (ref 26.0–34.0)
MCHC: 32 g/dL (ref 30.0–36.0)
MCV: 96.7 fL (ref 78.0–100.0)
PLATELETS: 98 10*3/uL — AB (ref 150–400)
RBC: 3.04 MIL/uL — AB (ref 3.87–5.11)
RDW: 14.1 % (ref 11.5–15.5)
WBC: 4.6 10*3/uL (ref 4.0–10.5)

## 2014-02-13 LAB — GLUCOSE, CAPILLARY
Glucose-Capillary: 107 mg/dL — ABNORMAL HIGH (ref 70–99)
Glucose-Capillary: 104 mg/dL — ABNORMAL HIGH (ref 70–99)
Glucose-Capillary: 108 mg/dL — ABNORMAL HIGH (ref 70–99)
Glucose-Capillary: 130 mg/dL — ABNORMAL HIGH (ref 70–99)

## 2014-02-13 LAB — BASIC METABOLIC PANEL
ANION GAP: 10 (ref 5–15)
BUN: 26 mg/dL — ABNORMAL HIGH (ref 6–23)
CHLORIDE: 98 meq/L (ref 96–112)
CO2: 30 meq/L (ref 19–32)
Calcium: 8.5 mg/dL (ref 8.4–10.5)
Creatinine, Ser: 2.12 mg/dL — ABNORMAL HIGH (ref 0.50–1.10)
GFR calc non Af Amer: 21 mL/min — ABNORMAL LOW (ref >90)
GFR, EST AFRICAN AMERICAN: 24 mL/min — AB (ref >90)
Glucose, Bld: 98 mg/dL (ref 70–99)
Potassium: 3.9 mEq/L (ref 3.7–5.3)
SODIUM: 138 meq/L (ref 137–147)

## 2014-02-13 NOTE — Progress Notes (Signed)
Utilization Review Completed.Denna Fryberger T12/20/2015  

## 2014-02-13 NOTE — Progress Notes (Signed)
  Echocardiogram 2D Echocardiogram has been performed.  Delcie RochENNINGTON, Elmarie Devlin 02/13/2014, 10:40 AM

## 2014-02-13 NOTE — Progress Notes (Signed)
Patient ID: [name]  female  WUJ:[id number]    DOB: 11/07/1932    DOA: 02/12/2014  PCP: [name],[name], MD  Brief history of present illness  Patient is a 80 year old female with hypertension, depression, anxiety, vitamin B12 deficiency, diabetes mellitus, chronic kidney disease, anorexia, ambulate minimally with wheelchair, occasional walker presented to the ED with daughter secondary to tremors. Patient lives with her son at home and visited by daughter every day. She was noticed to be weak, shivering, shaking and fever of 100.4. Patient otherwise had no hypoxia or tachycardia. Chest x-ray showed vascular congestion with elevated BNP and caution about opacity/infiltrate. Urinalysis was positive for UTI. Patient was given IV Lasix and was started on IV Rocephin and Zithromax.  Assessment/Plan: Principal Problem:   Acute respiratory failure secondary to community-acquired pneumonia and possible acute on chronic diastolic CHF, BNP of 14,197 1) Community-acquired pneumonia - Continue IV Rocephin and Zithromax - Incentive spirometry, bronchodilators as needed  2) acute on chronic diastolic CHF exacerbation - 2-D echo in 2013 showed EF 55-60%, normal wall motion, grade 1 diastolic dysfunction, moderate to severe tricuspid regurgitation. Will repeat 2-D echocardiogram. - Patient received IV Lasix in ED, creatinine worsened to 2.1 (patient also has chronic kidney disease stage III with baseline creatinine of 2.3-2.5 ) - Currently receiving Lasix 20 mg IV every 12 hours, follow creatinine function closely, strict I's and O's and daily weights    Seizure, convulsive - Continue Keppra    Cachexia, underweight, protein calorie malnutrition -  on nutritional supplements - Continue Remeron, Megace - PTOT evaluation    UTI (lower urinary tract infection) - Continue IV Rocephin, follow urine cultures  Hyperlipidemia Continue statin   Lower extremity sores - Per patient's daughter, due to  swelling and blisters opened up, dressing intact for now, will place wound care consult  DVT Prophylaxis:Heparin subcutaneous   Code Status:Full code   Family Communication:Discussed in detail with patient's daughter at the bedside   Disposition:Hopefully next 24-48 hours   Consultants:  None    Procedures:  none  Antibiotics:  IV Rocephin  IV azithromax  Subjective: Patient seen and examined, feeling a whole lot better today, no fevers or chills or coughing, shortness of breath improving, daughter at the bedside   Objective: Weight change:   Intake/Output Summary (Last 24 hours) at 02/13/14 1006 Last data filed at 02/13/14 0835  Gross per 24 hour  Intake    480 ml  Output      0 ml  Net    480 ml   Blood pressure 156/63, pulse 61, temperature 97.3 F (36.3 C), temperature source Oral, resp. rate 18, height 5\' 3"  (1.6 m), weight 50.4 kg (111 lb 1.8 oz), SpO2 98 %.  Physical Exam: General: Alert and awake, oriented x3, not in any acute distressA cachectic . CVS: S1-S2 clear, no murmur rubs or gallops ChestDecreased breath sounds at the bases Abdomen: Nontender, nondistended normal bowel sounds Extremities  no cyanosis, clubbing or edema noted bilaterally, dressing intact on the left lower extremity Neuro: Cranial nerves II-XII intact, no focal neurological deficits  Lab Results: Basic Metabolic Panel:  Recent Labs Lab 02/12/14 0851 02/13/14 0336  NA 142 138  K 4.1 3.9  CL 102 98  CO2 26 30  GLUCOSE 120* 98  BUN 23 26*  CREATININE 1.58* 2.12*  CALCIUM 8.9 8.5   Liver Function Tests: No results for input(s): AST, ALT, ALKPHOS, BILITOT, PROT, ALBUMIN in the last 168 hours. No results for  input(s): LIPASE, AMYLASE in the last 168 hours. No results for input(s): AMMONIA in the last 168 hours. CBC:  Recent Labs Lab 02/12/14 0851 02/13/14 0336  WBC 8.5 4.6  NEUTROABS 6.7  --   HGB 10.4* 9.4*  HCT 33.9* 29.4*  MCV 94.7 96.7  PLT 148* 98*    Cardiac Enzymes: No results for input(s): CKTOTAL, CKMB, CKMBINDEX, TROPONINI in the last 168 hours. BNP: Invalid input(s): POCBNP CBG:  Recent Labs Lab 02/12/14 0931 02/12/14 1621 02/12/14 2131 02/13/14 0552  GLUCAP 111* 130* 106* 107*     Micro Results: No results found for this or any previous visit (from the past 240 hour(s)).  Studies/Results: Dg Chest Port 1 View  02/12/2014   CLINICAL DATA:  One day history of productive cough  EXAM: PORTABLE CHEST - 1 VIEW  COMPARISON:  April 30, 2013  FINDINGS: There is cardiomegaly with pulmonary venous hypertension. There is generalized interstitial edema. There is consolidation in both lung bases with small left effusion. No adenopathy. No bone lesions.  IMPRESSION: Evidence of congestive heart failure. Question alveolar edema versus pneumonia in the lower lobes. Both entities may exist concurrently. Small left effusion present.   Electronically Signed   By: Bretta BangWilliam  Woodruff M.D.   On: 02/12/2014 09:20    Medications: Scheduled Meds: . atenolol  50 mg Oral BID  . atorvastatin  20 mg Oral QHS  . azithromycin  500 mg Intravenous Q24H  . cefTRIAXone (ROCEPHIN)  IV  1 g Intravenous Q24H  . cholecalciferol  1,000 Units Oral Daily  . cloNIDine  0.1 mg Oral BID  . docusate sodium  100 mg Oral BID  . feeding supplement (ENSURE COMPLETE)  237 mL Oral BID BM  . furosemide  20 mg Intravenous Q12H  . heparin  5,000 Units Subcutaneous 3 times per day  . levETIRAcetam  250 mg Oral BID  . mirtazapine  15 mg Oral Daily  . oxybutynin  5 mg Oral Daily  . senna  2 tablet Oral BID  . sodium chloride  3 mL Intravenous Q12H  . [START ON 02/14/2014] sodium polystyrene  15 g Oral Once per day on Mon Wed Fri  . cyanocobalamin  1,000 mcg Oral Daily      LOS: 1 day   Uva Runkel M.D. [facility] 02/13/2014, 10:06 AM Pager: [phone]  If 7PM-7AM, please contact night-coverage [email or web address] Password [email or web address]

## 2014-02-14 DIAGNOSIS — N183 Chronic kidney disease, stage 3 (moderate): Secondary | ICD-10-CM

## 2014-02-14 DIAGNOSIS — I5033 Acute on chronic diastolic (congestive) heart failure: Principal | ICD-10-CM

## 2014-02-14 DIAGNOSIS — I10 Essential (primary) hypertension: Secondary | ICD-10-CM

## 2014-02-14 LAB — BASIC METABOLIC PANEL
Anion gap: 11 (ref 5–15)
BUN: 35 mg/dL — ABNORMAL HIGH (ref 6–23)
CALCIUM: 8.4 mg/dL (ref 8.4–10.5)
CO2: 30 meq/L (ref 19–32)
Chloride: 98 mEq/L (ref 96–112)
Creatinine, Ser: 2.18 mg/dL — ABNORMAL HIGH (ref 0.50–1.10)
GFR calc Af Amer: 24 mL/min — ABNORMAL LOW (ref >90)
GFR calc non Af Amer: 20 mL/min — ABNORMAL LOW (ref >90)
Glucose, Bld: 165 mg/dL — ABNORMAL HIGH (ref 70–99)
POTASSIUM: 3.9 meq/L (ref 3.7–5.3)
SODIUM: 139 meq/L (ref 137–147)

## 2014-02-14 LAB — GLUCOSE, CAPILLARY
GLUCOSE-CAPILLARY: 155 mg/dL — AB (ref 70–99)
Glucose-Capillary: 95 mg/dL (ref 70–99)
Glucose-Capillary: 110 mg/dL — ABNORMAL HIGH (ref 70–99)
Glucose-Capillary: 132 mg/dL — ABNORMAL HIGH (ref 70–99)

## 2014-02-14 MED ORDER — ISOSORBIDE MONONITRATE ER 30 MG PO TB24
30.0000 mg | ORAL_TABLET | Freq: Every day | ORAL | Status: DC
  Administered 2014-02-14: 30 mg via ORAL
  Filled 2014-02-14 (×2): qty 1

## 2014-02-14 MED ORDER — HYDRALAZINE HCL 25 MG PO TABS
25.0000 mg | ORAL_TABLET | Freq: Three times a day (TID) | ORAL | Status: DC
  Administered 2014-02-14 – 2014-02-15 (×3): 25 mg via ORAL
  Filled 2014-02-14 (×6): qty 1

## 2014-02-14 MED ORDER — METOPROLOL SUCCINATE ER 50 MG PO TB24
50.0000 mg | ORAL_TABLET | Freq: Two times a day (BID) | ORAL | Status: DC
  Administered 2014-02-14 – 2014-02-15 (×3): 50 mg via ORAL
  Filled 2014-02-14 (×4): qty 1

## 2014-02-14 MED ORDER — ASPIRIN 81 MG PO CHEW
81.0000 mg | CHEWABLE_TABLET | Freq: Every day | ORAL | Status: DC
  Administered 2014-02-14 – 2014-02-15 (×2): 81 mg via ORAL
  Filled 2014-02-14 (×2): qty 1

## 2014-02-14 NOTE — Progress Notes (Signed)
Pt's daughter, Jasmine DecemberSharon requesting for update from cardiology as she stated was not in room when MD came to see pt.  Notified Katie and instructed making rounds at this time, but will speak with her later.  Pt's daughter made aware, at bed side at this time.  Amanda PeaNellie Kemari Narez, Charity fundraiserN.

## 2014-02-14 NOTE — Clinical Documentation Improvement (Signed)
Noted in progress notes documentation "Cachexia, underweight, protein calorie malnutrition -  on nutritional supplements, consult nutritional services"  Please specify the degree/type of malnutrition  . Severity: --Mild (first degree) --Moderate (second degree) --Severe (third degree) . Avoid documenting a range of severity, such as "moderate to severe"  --Other . Document any associated diagnoses/conditions  Supporting Information:   Thank Candiss NorseYou, Abdalrahman Clementson, RN, BSN, CDI [phone] [facility]

## 2014-02-14 NOTE — Progress Notes (Signed)
Patient ID: [name]  female  WJX:[id number]    DOB: 07/31/1932    DOA: 02/12/2014  PCP: [name],[name], MD  Brief history of present illness  Patient is a 80 year old female with hypertension, depression, anxiety, vitamin B12 deficiency, diabetes mellitus, chronic kidney disease, anorexia, ambulate minimally with wheelchair, occasional walker presented to the ED with daughter secondary to tremors. Patient lives with her son at home and visited by daughter every day. She was noticed to be weak, shivering, shaking and fever of 100.4. Patient otherwise had no hypoxia or tachycardia. Chest x-ray showed vascular congestion with elevated BNP and caution about opacity/infiltrate. Urinalysis was positive for UTI. Patient was given IV Lasix and was started on IV Rocephin and Zithromax.  Assessment/Plan: Principal Problem:   Acute respiratory failure secondary to community-acquired pneumonia and possible acute on chronic diastolic CHF, BNP of 14,197 1) Community-acquired pneumonia - Continue IV Rocephin and Zithromax - Incentive spirometry, bronchodilators as needed - Follow up outpatient with chest x-ray in couple weeks  2) acute on chronic diastolic CHF exacerbation - 2-D echo in 2013 showed EF 55-60% however repeat 2-D echo 12/20 showed EF of 35-40% with grade 1 diastolic dysfunction moderate hypokinesis of the entire inferior myocardium, mild hypokinesis of the entire anteroseptal myocardium. - Patient received IV Lasix in ED, creatinine worsened to 2.1 (patient also has chronic kidney disease stage III with baseline creatinine of 2.3-2.5 ) - IV Lasix was discontinued yesterday, recheck BMET, pending today -Called cardiology consult, highly appreciate recommendations, previous nuclear medicine study was low risk I recommended medical management. Discontinued atenolol, clonidine. Recommended hydralazine/nitrates, titrate with BP. Avoid ACE inhibitor secondary to baseline CKD.   Seizure,  convulsive - Continue Keppra    Cachexia, underweight, protein calorie malnutrition -  on nutritional supplements - Continue Remeron, Megace - PTOT evaluation   Gram-negative rods  UTI (lower urinary tract infection) - Continue IV Rocephin, follow urine cultures  Hyperlipidemia Continue statin   Lower extremity sores - Per patient's daughter, due to swelling and blisters opened up, dressing intact for now, wound care consult  DVT Prophylaxis:Heparin subcutaneous   Code Status:Full code   Family Communication:Discussed in detail with patient's daughter at the bedside   Disposition: Hopefully tomorrow, PT evaluation today  Consultants:  cardiology    Procedures:  2d Echo  Antibiotics:  IV Rocephin  IV azithromax  Subjective: No chest pain, fevers or chills, cough improving, daughter at bedside overall feeling better  Objective: Weight change: -0.509 kg (-1 lb 2 oz)  Intake/Output Summary (Last 24 hours) at 02/14/14 1134 Last data filed at 02/14/14 0900  Gross per 24 hour  Intake    660 ml  Output      0 ml  Net    660 ml   Blood pressure 174/53, pulse 56, temperature 97.3 F (36.3 C), temperature source Oral, resp. rate 18, height 5\' 3"  (1.6 m), weight 49.891 kg (109 lb 15.8 oz), SpO2 97 %.  Physical Exam: General: A x O x3, NAD CVS: S1-S2 clear, no murmur rubs or gallops Chest: Decreased breath sounds at the bases Abdomen: NT, ND, NBS Extremities  no c/c/e bilaterally, dressing intact on the left lower extremity Neuro: Cranial nerves II-XII intact, no focal neurological deficits  Lab Results: Basic Metabolic Panel:  Recent Labs Lab 02/12/14 0851 02/13/14 0336  NA 142 138  K 4.1 3.9  CL 102 98  CO2 26 30  GLUCOSE 120* 98  BUN 23 26*  CREATININE 1.58* 2.12*  CALCIUM 8.9 8.5   Liver Function Tests: No results for input(s): AST, ALT, ALKPHOS, BILITOT, PROT, ALBUMIN in the last 168 hours. No results for input(s): LIPASE, AMYLASE in the last  168 hours. No results for input(s): AMMONIA in the last 168 hours. CBC:  Recent Labs Lab 02/12/14 0851 02/13/14 0336  WBC 8.5 4.6  NEUTROABS 6.7  --   HGB 10.4* 9.4*  HCT 33.9* 29.4*  MCV 94.7 96.7  PLT 148* 98*   Cardiac Enzymes: No results for input(s): CKTOTAL, CKMB, CKMBINDEX, TROPONINI in the last 168 hours. BNP: Invalid input(s): POCBNP CBG:  Recent Labs Lab 02/13/14 0552 02/13/14 1113 02/13/14 1610 02/13/14 2050 02/14/14 0553  GLUCAP 107* 104* 108* 130* 95     Micro Results: Recent Results (from the past 240 hour(s))  Urine culture     Status: None (Preliminary result)   Collection Time: 02/12/14  9:41 AM  Result Value Ref Range Status   Specimen Description URINE, CATHETERIZED  Final   Special Requests NONE  Final   Culture  Setup Time   Final    02/12/2014 11:31 Performed at [facility]    Colony Count   Final    >=100,000 COLONIES/ML Performed at [facility]    Culture   Final    GRAM NEGATIVE RODS Performed at [facility]    Report Status PENDING  Incomplete    Studies/Results: Dg Chest Port 1 View  02/12/2014   CLINICAL DATA:  One day history of productive cough  EXAM: PORTABLE CHEST - 1 VIEW  COMPARISON:  April 30, 2013  FINDINGS: There is cardiomegaly with pulmonary venous hypertension. There is generalized interstitial edema. There is consolidation in both lung bases with small left effusion. No adenopathy. No bone lesions.  IMPRESSION: Evidence of congestive heart failure. Question alveolar edema versus pneumonia in the lower lobes. Both entities may exist concurrently. Small left effusion present.   Electronically Signed   By: Bretta BangWilliam  Woodruff M.D.   On: 02/12/2014 09:20    Medications: Scheduled Meds: . aspirin  81 mg Oral Daily  . atorvastatin  20 mg Oral QHS  . azithromycin  500 mg Intravenous Q24H  . cefTRIAXone (ROCEPHIN)  IV  1 g Intravenous Q24H  . cholecalciferol  1,000 Units Oral Daily  . feeding  supplement (ENSURE COMPLETE)  237 mL Oral BID BM  . heparin  5,000 Units Subcutaneous 3 times per day  . hydrALAZINE  25 mg Oral 3 times per day  . isosorbide mononitrate  30 mg Oral Daily  . levETIRAcetam  250 mg Oral BID  . metoprolol succinate  50 mg Oral BID  . mirtazapine  15 mg Oral Daily  . oxybutynin  5 mg Oral Daily  . senna  2 tablet Oral BID  . sodium chloride  3 mL Intravenous Q12H  . cyanocobalamin  1,000 mcg Oral Daily      LOS: 2 days   Annetta Deiss M.D. [facility] 02/14/2014, 11:34 AM Pager: [phone]  If 7PM-7AM, please contact night-coverage [email or web address] Password [email or web address]

## 2014-02-14 NOTE — Consult Note (Signed)
WOC wound consult note Reason for Consult: evaluation of LE wounds.  Pt's daughter at bedside and reports that patient has chronic LE edema which results in blisters that open up and she also "bumps" her legs over the chair at home.  Pt with no significant edema and bilateral palpable pulses.  Wound type: LE ulcerations, partial thickness.  The most RLE distal areas appear to be ruptured bulla and the lateral wound on the right leg does appear to be a skin tear. The daughter confirms this.  Pressure Ulcer POA: No Measurement: 2.0cm x .1.5cm x 0.2cm; two areas distal 0.5cm x 0.5cm x 0.1cm  Wound bed: all area have some dried sanguinous drainage, do not appear to be infected.  Drainage (amount, consistency, odor) minimal, no odor Periwound: senile purpura over the bilateral LEs  Dressing procedure/placement/frequency: Silicone foam to protect and insulate the wounds. Change every 3 days.  Discussion daughter, pt does answer questions but a little delayed in her conversation.  She has compression stockings at home but does not regularly wear them.  I have suggested that now that the edema has subsided it would be appropriate to have her wear her stockings as they will be easier to don and will assist in maintenance of her LE. Additionally the daughter and I discussed the use of ACE wraps which are not therapeutic compression but may be all that the patient will allow.  Recommended elevation also.  Discussed POC with patient and bedside nurse.  Re consult if needed, will not follow at this time. Thanks  Daneil Beem [employer] Lockerustin RN, CWOCN [phone])

## 2014-02-14 NOTE — Evaluation (Signed)
Physical Therapy Evaluation Patient Details Name: [name] MRN: [id number] DOB: 03/09/1932 Today's Date: 02/14/2014   History of Present Illness  Pt adm with acute respiratory failure secondary to community-acquired pneumonia and possible acute on chronic diastolic CHF. PMH - THA, HTN, DM, anxiety  Clinical Impression  Pt admitted with above diagnosis. Pt currently with functional limitations due to the deficits listed below (see PT Problem List).  Pt will benefit from skilled PT to increase their independence and safety with mobility to allow discharge to the venue listed below.  Pt with very supportive family that can provide needed assist at home.     Follow Up Recommendations Supervision/Assistance - 24 hour;No PT follow up    Equipment Recommendations  None recommended by PT    Recommendations for Other Services       Precautions / Restrictions Precautions Precautions: Fall      Mobility  Bed Mobility Overal bed mobility: Needs Assistance Bed Mobility: Supine to Sit;Sit to Supine     Supine to sit: Mod assist;HOB elevated Sit to supine: Min assist   General bed mobility comments: Assist to bring legs over and trunk up into sitting. Assist to take legs back up into bed.  Transfers Overall transfer level: Needs assistance Equipment used: Rolling walker (2 wheeled) Transfers: Sit to/from Stand Sit to Stand: Min assist         General transfer comment: Assist to bring hips up. Pt with posterior lean.  Ambulation/Gait Ambulation/Gait assistance: Min assist Ambulation Distance (Feet): 25 Feet Assistive device: Rolling walker (2 wheeled) Gait Pattern/deviations: Step-through pattern;Decreased step length - right;Decreased step length - left;Shuffle;Trunk flexed   Gait velocity interpretation: Below normal speed for age/gender General Gait Details: Verbal cues to stand more erect and to look up. Pt very tremulous which family reports is baseline.  Stairs             Wheelchair Mobility    Modified Rankin (Stroke Patients Only)       Balance Overall balance assessment: Needs assistance Sitting-balance support: Bilateral upper extremity supported;Feet unsupported Sitting balance-Leahy Scale: Poor Sitting balance - Comments: Pt falls posteriorly Postural control: Posterior lean Standing balance support: Bilateral upper extremity supported Standing balance-Leahy Scale: Poor Standing balance comment: Walker and min A for static standing.                             Pertinent Vitals/Pain Pain Assessment: No/denies pain    Home Living Family/patient expects to be discharged to:: Private residence Living Arrangements: Children Available Help at Discharge: Family;Available 24 hours/day Type of Home: House Home Access: Ramped entrance     Home Layout: One level Home Equipment: Walker - 2 wheels;Wheelchair - [employer] Corporationmanual;Bedside commode;Tub bench      Prior Function Level of Independence: Needs assistance   Gait / Transfers Assistance Needed: Amb short distances with walker and min A to supervision. Uses w/c quite a bit.           Hand Dominance        Extremity/Trunk Assessment   Upper Extremity Assessment: Generalized weakness           Lower Extremity Assessment: Generalized weakness         Communication   Communication: No difficulties  Cognition Arousal/Alertness: Awake/alert Behavior During Therapy: Anxious Overall Cognitive Status: History of cognitive impairments - at baseline  General Comments      Exercises        Assessment/Plan    PT Assessment Patient needs continued PT services  PT Diagnosis Difficulty walking;Generalized weakness   PT Problem List Decreased strength;Decreased activity tolerance;Decreased balance;Decreased mobility;Decreased knowledge of use of DME  PT Treatment Interventions DME instruction;Gait training;Patient/family  education;Functional mobility training;Therapeutic activities;Therapeutic exercise;Balance training   PT Goals (Current goals can be found in the Care Plan section) Acute Rehab PT Goals Patient Stated Goal: Return home PT Goal Formulation: With patient/family Time For Goal Achievement: 02/21/14 Potential to Achieve Goals: Good    Frequency Min 3X/week   Barriers to discharge        Co-evaluation               End of Session Equipment Utilized During Treatment: Gait belt Activity Tolerance: Patient tolerated treatment well Patient left: in bed;with call bell/phone within reach;with bed alarm set;with family/visitor present           Time: 1533-1550 PT Time Calculation (min) (ACUTE ONLY): 17 min   Charges:   PT Evaluation $Initial PT Evaluation Tier I: 1 Procedure PT Treatments $Gait Training: 8-22 mins   PT G Codes:          Lourene Hoston 02/14/2014, 4:20 PM  [employer] CorporationCary Latonya Knight PT [phone]

## 2014-02-14 NOTE — Progress Notes (Signed)
Patient is sleeping peacefully. Maintained on the monitor and is in NSR with a heart rate of 63. Will continue to monitor.  Sharlene Doryanya Larkyn Greenberger, RN

## 2014-02-14 NOTE — Consult Note (Addendum)
Primary cardiologist: Dr Gwenlyn Found  HPI: 80 year old female for evaluation of congestive heart failure. Echocardiogram June 2013 showed normal LV function, grade 1 diastolic dysfunction, moderate mitral regurgitation, biatrial enlargement, moderate to severe tricuspid regurgitation. Nuclear study 04/23/2013 showed probable inferior attenuation but small region of infarct with minimal peri-infarct ischemia could not be excluded. She treated medically. History is very difficult as patient is somewhat confused. She does not recall why she was brought to the hospital. She does note some increased dyspnea but denies fevers, chills, productive cough or hemoptysis. Based on the notes she apparently has been noted to be weak, had chills and fever to 100.4. She has been treated with antibiotics for possible pneumonia and diuretics for possible congestive heart failure based on chest x-ray and elevated BNP. She denies chest pain. Cardiology is asked to evaluate. She denies orthopnea, PND.  Medications Prior to Admission  Medication Sig Dispense Refill  . acetaminophen (TYLENOL) 325 MG tablet Take 650 mg by mouth every 6 (six) hours as needed for headache.    Marland Kitchen atenolol (TENORMIN) 50 MG tablet Take 50 mg by mouth 2 (two) times daily.    Marland Kitchen atorvastatin (LIPITOR) 20 MG tablet Take 20 mg by mouth at bedtime. daily    . Cholecalciferol (VITAMIN D-3) 1000 UNITS CAPS Take 1,000 Units by mouth daily.    . cloNIDine (CATAPRES) 0.1 MG tablet Take 0.1 mg by mouth 2 (two) times daily.     . [employer] VITAMIN B12 1000 MCG tablet TAKE 1 TABLET (1,000 MCG TOTAL) BY MOUTH DAILY. 30 tablet 6  . docusate sodium (COLACE) 100 MG capsule Take 100 mg by mouth daily as needed for mild constipation.    . furosemide (LASIX) 20 MG tablet Take 20 mg by mouth daily.     Marland Kitchen levETIRAcetam (KEPPRA) 250 MG tablet TAKE 1 TABLET BY MOUTH TWICE A DAY 10 tablet 0  . megestrol (MEGACE) 40 MG tablet TAKE 1 TABLET (40 MG TOTAL) BY MOUTH 2 (TWO) TIMES  DAILY. 60 tablet 1  . mirtazapine (REMERON) 15 MG tablet Take 15 mg by mouth daily.    Marland Kitchen oxybutynin (DITROPAN-XL) 5 MG 24 hr tablet Take 5 mg by mouth daily.    Marland Kitchen senna (SENOKOT) 8.6 MG TABS tablet Take 2 tablets (17.2 mg total) by mouth 2 (two) times daily. 120 each 6  . sodium polystyrene (KAYEXALATE) 15 GM/60ML suspension Take 15 g by mouth 3 (three) times a week. Monday, Wednesday and Friday    . HYDROcodone-acetaminophen (NORCO/VICODIN) 5-325 MG per tablet Take 2 tablets by mouth every 6 (six) hours as needed for moderate pain.    Marland Kitchen tiZANidine (ZANAFLEX) 4 MG tablet Take 1 tablet (4 mg total) by mouth every 6 (six) hours as needed for muscle spasms. (Patient not taking: Reported on 02/12/2014) 30 tablet 0    No Known Allergies  Past Medical History  Diagnosis Date  . Hypertension   . Anxiety and depression   . Chronic headaches   . Diverticular disease   . Anemia   . Arthritis     osteoarthritis  . Osteoporosis   . Vitamin B 12 deficiency   . History of ITP   . Anxiety     takes xanax for sleep  . Diabetes mellitus     NIDDM x 12 years  . Heart murmur     benign;asymptomatic  . Chronic kidney disease     ESRD - AV fistula in LUE (Dr. Gershon Cull = nephrologist)  .  Left knee pain 02/08/2013  . Cachexia 02/08/2013  . Hyperlipidemia   . Tremors of nervous system   . Constipated 09/30/2013    Past Surgical History  Procedure Laterality Date  . Av fistula placement  11/15/10    left upper arm AVF  . Abdominal hysterectomy  1973  . Joint replacement  2007    rt hip  . Eye surgery    . Cataract extraction w/ intraocular lens  implant, bilateral  2002  . Vascular surgery  10-2010    left AVF  . Hernia repair  2000    ventral  . Ventral hernia repair  08/06/2011    Procedure: HERNIA REPAIR VENTRAL ADULT;  Surgeon: Zenovia Jarred, MD;  Location: [facility];  Service: General;  Laterality: N/A;  Open Repair [place] with Mesh  . Cholecystectomy  yrs ago  .  Total hip arthroplasty Left 04/27/2013    Procedure: LEFT TOTAL HIP ARTHROPLASTY ANTERIOR APPROACH;  Surgeon: Mauri Pole, MD;  Location: [facility];  Service: Orthopedics;  Laterality: Left;    History   Social History  . Marital Status: Widowed    Spouse Name: N/A    Number of Children: 23  . Years of Education: N/A   Occupational History  . Not on file.   Social History Main Topics  . Smoking status: Never Smoker   . Smokeless tobacco: Current User    Types: Snuff  . Alcohol Use: No  . Drug Use: No  . Sexual Activity: No   Other Topics Concern  . Not on file   Social History Narrative    Family History  Problem Relation Age of Onset  . Stroke Mother   . Heart disease Father     ROS:  no fevers or chills, productive cough, hemoptysis, dysphasia, odynophagia, melena, hematochezia, dysuria, hematuria, rash, seizure activity, orthopnea, PND, pedal edema, claudication. Remaining systems are negative.  Physical Exam:   Blood pressure 174/53, pulse 56, temperature 97.3 F (36.3 C), temperature source Oral, resp. rate 18, height _0  (1.6 m), weight 109 lb 15.8 oz (49.891 kg), SpO2 97 %.  General:  Well developed/frail in NAD Skin warm/dry, diffuse ecchymoses Patient not depressed No peripheral clubbing Back-normal HEENT-normal/normal eyelids Neck supple/normal carotid upstroke bilaterally; no bruits; no JVD; no thyromegaly chest - Diminished BS bases CV - RRR/normal S1 and S2; no rubs or gallops;  PMI nondisplaced; 2/6 systolic murmur apex Abdomen -NT/ND, no HSM, no mass, + bowel sounds, no bruit 2+ femoral pulses, no bruits Ext-no edema, chords, 2+ DP; wound dressing in place; AV fistula LUE Neuro-confused; moves all ext  ECG sinus rhythm, left atrial enlargement, left ventricle hypertrophy, nonspecific ST changes.  Results for orders placed or performed during the hospital encounter of 02/12/14 (from the past 48 hour(s))  Glucose, capillary     Status: Abnormal     Collection Time: 02/12/14  4:21 PM  Result Value Ref Range   Glucose-Capillary 130 (H) 70 - 99 mg/dL   Comment 1 Notify RN   Glucose, capillary     Status: Abnormal   Collection Time: 02/12/14  9:31 PM  Result Value Ref Range   Glucose-Capillary 106 (H) 70 - 99 mg/dL  CBC     Status: Abnormal   Collection Time: 02/13/14  3:36 AM  Result Value Ref Range   WBC 4.6 4.0 - 10.5 K/uL   RBC 3.04 (L) 3.87 - 5.11 MIL/uL   Hemoglobin 9.4 (L) 12.0 - 15.0 g/dL  HCT 29.4 (L) 36.0 - 46.0 %   MCV 96.7 78.0 - 100.0 fL   MCH 30.9 26.0 - 34.0 pg   MCHC 32.0 30.0 - 36.0 g/dL   RDW 14.1 11.5 - 15.5 %   Platelets 98 (L) 150 - 400 K/uL    Comment: SPECIMEN CHECKED FOR CLOTS REPEATED TO VERIFY PLATELET COUNT CONFIRMED BY SMEAR   Basic metabolic panel     Status: Abnormal   Collection Time: 02/13/14  3:36 AM  Result Value Ref Range   Sodium 138 137 - 147 mEq/L   Potassium 3.9 3.7 - 5.3 mEq/L   Chloride 98 96 - 112 mEq/L   CO2 30 19 - 32 mEq/L   Glucose, Bld 98 70 - 99 mg/dL   BUN 26 (H) 6 - 23 mg/dL   Creatinine, Ser 2.12 (H) 0.50 - 1.10 mg/dL   Calcium 8.5 8.4 - 10.5 mg/dL   GFR calc non Af Amer 21 (L) >90 mL/min   GFR calc Af Amer 24 (L) >90 mL/min    Comment: (NOTE) The eGFR has been calculated using the CKD EPI equation. This calculation has not been validated in all clinical situations. eGFR's persistently <90 mL/min signify possible Chronic Kidney Disease.    Anion gap 10 5 - 15  Glucose, capillary     Status: Abnormal   Collection Time: 02/13/14  5:52 AM  Result Value Ref Range   Glucose-Capillary 107 (H) 70 - 99 mg/dL   Comment 1 Notify RN    Comment 2 Documented in Chart   Glucose, capillary     Status: Abnormal   Collection Time: 02/13/14 11:13 AM  Result Value Ref Range   Glucose-Capillary 104 (H) 70 - 99 mg/dL   Comment 1 Notify RN   Glucose, capillary     Status: Abnormal   Collection Time: 02/13/14  4:10 PM  Result Value Ref Range   Glucose-Capillary 108 (H) 70 -  99 mg/dL   Comment 1 Notify RN   Glucose, capillary     Status: Abnormal   Collection Time: 02/13/14  8:50 PM  Result Value Ref Range   Glucose-Capillary 130 (H) 70 - 99 mg/dL  Glucose, capillary     Status: None   Collection Time: 02/14/14  5:53 AM  Result Value Ref Range   Glucose-Capillary 95 70 - 99 mg/dL    No results found.  Assessment/Plan 1 acute on chronic diastolic congestive heart failure-at the time of admission the patient's chest x-ray suggests possible combination of congestive heart failure and pneumonia. BNP was elevated. She received IV diuretics. She does not appear to be significantly volume overloaded on examination. Would recheck renal function tomorrow morning. If stable would resume Lasix 20 mg daily (home dose) and follow; increase as needed. Note her LV function by report is mild to moderately reduced which is new. This may be related to uncontrolled hypertension. Would change atenolol to Toprol. Would discontinue clonidine. Add hydralazine/nitrates and titrate for blood pressure. We'll avoid ACE inhibitor given baseline renal insufficiency. Previous nuclear study was low risk. 2 pneumonia-continue antibiotics. 3 chronic stage III renal failure-follow renal function closely with recent diuresis. 4 hypertension-blood pressure is elevated. Adjustments as outlined under #1. 5 hyperlipidemia-continue statin. Given DM, add ASA 81 mg daily. 6 confusion-further evaluation per primary care.  Kirk Ruths MD 02/14/2014, 9:44 AM

## 2014-02-14 NOTE — Progress Notes (Signed)
INITIAL NUTRITION ASSESSMENT  DOCUMENTATION CODES Per approved criteria  -Non-severe (moderate) malnutrition in the context of chronic illness   INTERVENTION: 1.  General healthful diet; encourage intake of foods and beverages as able.  RD to follow and assess for nutritional adequacy.  2.  Supplement; Ensure Complete po BID, each supplement provides 350 kcal and 13 grams of protein  NUTRITION DIAGNOSIS: Inadequate oral intake related to therapeutic diet as evidenced by pt report.   Monitor:  1.  Food/Beverage; pt meeting >/=90% estimated needs with tolerance. 2.  Wt/wt change; monitor trends  Reason for Assessment: consult; poor PO  80 y.o. female  Admitting Dx: Acute respiratory failure  ASSESSMENT: Pt admitted with acute respiratory failure.  Patient has been seen in the past year by clinical RD staff and was found to have severe protein-calorie malnutrition with weight of 92 lbs.  Since March, pt has gained weight, now weighing 109 lbs.   RD met with patient and daughter who state that patient is eating well at home.  Daughter states that she and brother care for patient around the clock and provide all her meals.  Daughter states that patient eats well as long as it is something she likes.  Patient states she has been picky "all my life."   Patient states she weighed 150 lbs "a while ago" but lost weight after several illnesses and did not regain.   Daughter and patient deny nutrition-related concerns, but daughter does request information re: Heart healthy diet.  Patient requests diet be liberalized as she cannot order foods she likes on current diet order and feels she could eat more if choice were not limited. RD paged MD.   Nutrition Focused Physical Exam: Subcutaneous Fat:  Orbital Region: mild wasting Upper Arm Region: severe wasting Thoracic and Lumbar Region: mild wasting  Muscle:  Temple Region: moderate wasting Clavicle Bone Region: moderate wasting Clavicle and  Acromion Bone Region: moderate wasting Scapular Bone Region: not assessed Dorsal Hand: severe wasting Patellar Region: not assessed Anterior Thigh Region: not assessed Posterior Calf Region: not assessed  Pt meets criteria for moderate MALNUTRITION in the context of chronic as evidenced by mild-moderate wasting identified on physical exam with variable intake at home not meeting >75% of estimated needs consistently.  Edema:   Height: Ht Readings from Last 1 Encounters:  02/12/14 $RemoveB'5\' 3"'WphDhiOY$  (1.6 m)    Weight: Wt Readings from Last 1 Encounters:  02/14/14 109 lb 15.8 oz (49.891 kg)    Ideal Body Weight: 115 lbs  % Ideal Body Weight: 94%  Wt Readings from Last 10 Encounters:  02/14/14 109 lb 15.8 oz (49.891 kg)  09/30/13 99 lb 12.8 oz (45.269 kg)  06/28/13 105 lb 4.8 oz (47.764 kg)  05/03/13 104 lb 6.4 oz (47.356 kg)  04/27/13 92 lb (41.731 kg)  04/23/13 92 lb (41.731 kg)  04/21/13 92 lb (41.731 kg)  04/16/13 92 lb (41.731 kg)  02/08/13 105 lb 12.8 oz (47.991 kg)  01/11/13 104 lb 9.6 oz (47.446 kg)    Usual Body Weight: 92-109 lbs, highly variable  % Usual Body Weight: 100%  BMI:  Body mass index is 19.49 kg/(m^2).  Estimated Nutritional Needs: Kcal: 6294-7654 Protein: 50-60g Fluid: ~1.5 L/day  Skin: intact  Diet Order: Diet Heart  EDUCATION NEEDS: -Education not appropriate at this time   Intake/Output Summary (Last 24 hours) at 02/14/14 1229 Last data filed at 02/14/14 0900  Gross per 24 hour  Intake    660 ml  Output  0 ml  Net    660 ml    Last BM: 12/20   Labs:   Recent Labs Lab 02/12/14 0851 02/13/14 0336  NA 142 138  K 4.1 3.9  CL 102 98  CO2 26 30  BUN 23 26*  CREATININE 1.58* 2.12*  CALCIUM 8.9 8.5  GLUCOSE 120* 98    CBG (last 3)   Recent Labs  02/13/14 1610 02/13/14 2050 02/14/14 0553  GLUCAP 108* 130* 95    Scheduled Meds: . aspirin  81 mg Oral Daily  . atorvastatin  20 mg Oral QHS  . azithromycin  500 mg  Intravenous Q24H  . cefTRIAXone (ROCEPHIN)  IV  1 g Intravenous Q24H  . cholecalciferol  1,000 Units Oral Daily  . feeding supplement (ENSURE COMPLETE)  237 mL Oral BID BM  . heparin  5,000 Units Subcutaneous 3 times per day  . hydrALAZINE  25 mg Oral 3 times per day  . isosorbide mononitrate  30 mg Oral Daily  . levETIRAcetam  250 mg Oral BID  . metoprolol succinate  50 mg Oral BID  . mirtazapine  15 mg Oral Daily  . oxybutynin  5 mg Oral Daily  . senna  2 tablet Oral BID  . sodium chloride  3 mL Intravenous Q12H  . cyanocobalamin  1,000 mcg Oral Daily    Continuous Infusions:   Past Medical History  Diagnosis Date  . Hypertension   . Anxiety and depression   . Chronic headaches   . Diverticular disease   . Anemia   . Arthritis     osteoarthritis  . Osteoporosis   . Vitamin B 12 deficiency   . History of ITP   . Anxiety     takes xanax for sleep  . Diabetes mellitus     NIDDM x 12 years  . Heart murmur     benign;asymptomatic  . Chronic kidney disease     ESRD - AV fistula in LUE (Dr. Gershon Cull = nephrologist)  . Left knee pain 02/08/2013  . Cachexia 02/08/2013  . Hyperlipidemia   . Tremors of nervous system   . Constipated 09/30/2013    Past Surgical History  Procedure Laterality Date  . Av fistula placement  11/15/10    left upper arm AVF  . Abdominal hysterectomy  1973  . Joint replacement  2007    rt hip  . Eye surgery    . Cataract extraction w/ intraocular lens  implant, bilateral  2002  . Vascular surgery  10-2010    left AVF  . Hernia repair  2000    ventral  . Ventral hernia repair  08/06/2011    Procedure: HERNIA REPAIR VENTRAL ADULT;  Surgeon: Zenovia Jarred, MD;  Location: [facility];  Service: General;  Laterality: N/A;  Open Repair [place] with Mesh  . Cholecystectomy  yrs ago  . Total hip arthroplasty Left 04/27/2013    Procedure: LEFT TOTAL HIP ARTHROPLASTY ANTERIOR APPROACH;  Surgeon: Mauri Pole, MD;  Location: [facility];   Service: Orthopedics;  Laterality: Left;    Brynda Greathouse, MS RD LDN Clinical Inpatient Dietitian Weekend/After hours pager: [phone]

## 2014-02-15 DIAGNOSIS — I5023 Acute on chronic systolic (congestive) heart failure: Secondary | ICD-10-CM

## 2014-02-15 LAB — URINE CULTURE: Colony Count: >=100000

## 2014-02-15 LAB — GLUCOSE, CAPILLARY
GLUCOSE-CAPILLARY: 115 mg/dL — AB (ref 70–99)
Glucose-Capillary: 139 mg/dL — ABNORMAL HIGH (ref 70–99)

## 2014-02-15 LAB — CBC
HCT: 30.4 % — ABNORMAL LOW (ref 36.0–46.0)
Hemoglobin: 9.7 g/dL — ABNORMAL LOW (ref 12.0–15.0)
MCH: 30.2 pg (ref 26.0–34.0)
MCHC: 31.9 g/dL (ref 30.0–36.0)
MCV: 94.7 fL (ref 78.0–100.0)
Platelets: 103 10*3/uL — ABNORMAL LOW (ref 150–400)
RBC: 3.21 MIL/uL — ABNORMAL LOW (ref 3.87–5.11)
RDW: 13.7 % (ref 11.5–15.5)
WBC: 5.4 10*3/uL (ref 4.0–10.5)

## 2014-02-15 LAB — BASIC METABOLIC PANEL
Anion gap: 8 (ref 5–15)
BUN: 34 mg/dL — AB (ref 6–23)
CO2: 29 mmol/L (ref 19–32)
CREATININE: 2.23 mg/dL — AB (ref 0.50–1.10)
Calcium: 8.3 mg/dL — ABNORMAL LOW (ref 8.4–10.5)
Chloride: 101 mEq/L (ref 96–112)
GFR calc Af Amer: 23 mL/min — ABNORMAL LOW (ref >90)
GFR calc non Af Amer: 20 mL/min — ABNORMAL LOW (ref >90)
Glucose, Bld: 99 mg/dL (ref 70–99)
Potassium: 4.2 mmol/L (ref 3.5–5.1)
Sodium: 138 mmol/L (ref 135–145)

## 2014-02-15 MED ORDER — METOPROLOL SUCCINATE ER 50 MG PO TB24: 50.0000 mg | ORAL_TABLET | Freq: Two times a day (BID) | ORAL | Status: AC

## 2014-02-15 MED ORDER — FUROSEMIDE 20 MG PO TABS: 20.0000 mg | ORAL_TABLET | Freq: Every day | ORAL | Status: DC

## 2014-02-15 MED ORDER — ISOSORBIDE MONONITRATE ER 60 MG PO TB24
60.0000 mg | ORAL_TABLET | Freq: Every day | ORAL | Status: DC
  Administered 2014-02-15: 60 mg via ORAL
  Filled 2014-02-15: qty 1

## 2014-02-15 MED ORDER — HYDRALAZINE HCL 50 MG PO TABS
50.0000 mg | ORAL_TABLET | Freq: Three times a day (TID) | ORAL | Status: DC
  Administered 2014-02-15: 50 mg via ORAL
  Filled 2014-02-15 (×3): qty 1

## 2014-02-15 MED ORDER — CEFUROXIME AXETIL 500 MG PO TABS: 500.0000 mg | ORAL_TABLET | Freq: Two times a day (BID) | ORAL | Status: DC

## 2014-02-15 MED ORDER — ISOSORBIDE MONONITRATE ER 60 MG PO TB24: 60.0000 mg | ORAL_TABLET | Freq: Every day | ORAL | Status: AC

## 2014-02-15 MED ORDER — HYDRALAZINE HCL 50 MG PO TABS: 50.0000 mg | ORAL_TABLET | Freq: Three times a day (TID) | ORAL | Status: DC

## 2014-02-15 MED ORDER — ASPIRIN 81 MG PO CHEW: 81.0000 mg | CHEWABLE_TABLET | Freq: Every day | ORAL | Status: DC

## 2014-02-15 MED ORDER — AZITHROMYCIN 500 MG PO TABS: 500.0000 mg | ORAL_TABLET | Freq: Every day | ORAL | Status: DC

## 2014-02-15 NOTE — Care Management Note (Signed)
    Page 1 of 1   02/15/2014     2:07:33 PM CARE MANAGEMENT NOTE 02/15/2014  Patient:  Dante GangDUNN,Sameera L   Account Number:  [id number]  Date Initiated:  02/15/2014  Documentation initiated by:  Nikky Duba  Subjective/Objective Assessment:   UTI, PNA     Action/Plan:   CM to follow for disposition needs   Anticipated DC Date:  02/15/2014   Anticipated DC Plan:  HOME W HOME HEALTH SERVICES      DC Planning Services  CM consult      [facility]   Choice offered to / List presented to:  C-4 Adult Children        HH arranged  HH-1 RN  HH-10 DISEASE MANAGEMENT      HH agency  [facility]   Status of service:  Completed, signed off Medicare Important Message given?  YES (If response is "NO", the following Medicare IM given date fields will be blank) Date Medicare IM given:  02/15/2014 Medicare IM given by:  Shanessa Hodak Date Additional Medicare IM given:   Additional Medicare IM given by:    Discharge Disposition:  HOME W HOME HEALTH SERVICES  Per UR Regulation:  Reviewed for med. necessity/level of care/duration of stay  If discussed at Long Length of Stay Meetings, dates discussed:    Comments:  Gabrien Mentink RN, BSN, MSHL, CCM  Nurse - Case Manager,  ([facility] [id number])  [phone]  02/15/2014 UTI, PNA PT RECS:  None; 24 hour supervision HH RN for MED mgmt.  Several medication changes this admission.  ([facility] notified)

## 2014-02-15 NOTE — Progress Notes (Signed)
Discharge instructions completed. Went over medication regimen with patient and daughter. Patient and daughter verbalizes understanding.

## 2014-02-15 NOTE — Progress Notes (Signed)
Patient Name: [name] Date of Encounter: 02/15/2014  Principal Problem:   Acute respiratory failure Active Problems:   Seizure, convulsive   Cachexia   HLD (hyperlipidemia)   Underweight   UTI (lower urinary tract infection)   PNA (pneumonia)   CHF (congestive heart failure)   Length of Stay: 3  SUBJECTIVE  The patient states that her SOB has improved, no chest pain.   CURRENT MEDS . aspirin  81 mg Oral Daily  . atorvastatin  20 mg Oral QHS  . azithromycin  500 mg Intravenous Q24H  . cefTRIAXone (ROCEPHIN)  IV  1 g Intravenous Q24H  . cholecalciferol  1,000 Units Oral Daily  . feeding supplement (ENSURE COMPLETE)  237 mL Oral BID BM  . heparin  5,000 Units Subcutaneous 3 times per day  . hydrALAZINE  50 mg Oral 3 times per day  . isosorbide mononitrate  60 mg Oral Daily  . levETIRAcetam  250 mg Oral BID  . metoprolol succinate  50 mg Oral BID  . mirtazapine  15 mg Oral Daily  . oxybutynin  5 mg Oral Daily  . senna  2 tablet Oral BID  . sodium chloride  3 mL Intravenous Q12H  . cyanocobalamin  1,000 mcg Oral Daily    OBJECTIVE  Filed Vitals:   02/15/14 0653 02/15/14 0754 02/15/14 0757 02/15/14 1245  BP: 152/76 212/67 196/63 175/79  Pulse:  66  75  Temp:  98.5 F (36.9 C)    TempSrc:  Oral    Resp:  18    Height:      Weight:      SpO2:  96%      Intake/Output Summary (Last 24 hours) at 02/15/14 1254 Last data filed at 02/15/14 0947  Gross per 24 hour  Intake    480 ml  Output      0 ml  Net    480 ml   Filed Weights   02/12/14 1249 02/14/14 0548 02/15/14 0533  Weight: 111 lb 1.8 oz (50.4 kg) 109 lb 15.8 oz (49.891 kg) 108 lb 13.1 oz (49.36 kg)    PHYSICAL EXAM  General: Pleasant, NAD. Neuro: Alert and oriented X 3. Moves all extremities spontaneously. Psych: Normal affect. HEENT:  Normal  Neck: Supple without bruits or JVD. Lungs:  Resp regular and unlabored, CTA. Heart: RRR no s3, s4, or murmurs. Abdomen: Soft, non-tender,  non-distended, BS + x 4.  Extremities: No clubbing, cyanosis or edema. DP/PT/Radials 2+ and equal bilaterally.  Accessory Clinical Findings  CBC  Recent Labs  02/13/14 0336 02/15/14 0426  WBC 4.6 5.4  HGB 9.4* 9.7*  HCT 29.4* 30.4*  MCV 96.7 94.7  PLT 98* 103*   Basic Metabolic Panel  Recent Labs  02/14/14 1406 02/15/14 0426  NA 139 138  K 3.9 4.2  CL 98 101  CO2 30 29  GLUCOSE 165* 99  BUN 35* 34*  CREATININE 2.18* 2.23*  CALCIUM 8.4 8.3*   Radiology/Studies  Dg Chest Port 1 View  02/12/2014   CLINICAL DATA:  One day history of productive cough  EXAM: PORTABLE CHEST - 1 VIEW  COMPARISON:  April 30, 2013  FINDINGS: There is cardiomegaly with pulmonary venous hypertension. There is generalized interstitial edema. There is consolidation in both lung bases with small left effusion. No adenopathy. No bone lesions.  IMPRESSION: Evidence of congestive heart failure. Question alveolar edema versus pneumonia in the lower lobes. Both entities may exist concurrently. Small left effusion present.  TELE: SR    ASSESSMENT AND PLAN  1 acute on chronic diastolic congestive heart failure-at the time of admission the patient's chest x-ray suggests possible combination of congestive heart failure and pneumonia. BNP was elevated 14.200. She received IV diuretics. She does not appear to be significantly volume overloaded on examination, appears euvolemic today and her Crea has been worsening, I would hold lasix for the next 3 days then restart home dose of lasix 20 mg po daily.  2. HTN - she will need close BP control, agree with uptitrating nitrate/hydralazine this am. Dr Jens Somrenshaw will follow on January 5,2016. Note her LV function by report is mild to moderately reduced which is new. This may be related to uncontrolled hypertension. Atenolol was changed to Toprol. Discontinued clonidine.   2 pneumonia-continue antibiotics. 3 chronic stage III renal failure-follow renal function  closely with recent diuresis. 4 hypertension-blood pressure is elevated. Adjustments as outlined under #1. 5 hyperlipidemia-continue statin. Given DM, add ASA 81 mg daily. 6 confusion-further evaluation per primary care.  Stable to be discharge today, follow up with Dr Jens Somrenshaw on March 01, 2014.  Signed, Lars MassonNELSON, [place] Savini H MD, [facility] LadysmithFACC 02/15/2014

## 2014-02-15 NOTE — Discharge Summary (Signed)
Physician Discharge Summary  Patient ID: [name] MRN: [id number] DOB/AGE: 78/04/1932 78 y.o.  Admit date: 02/12/2014 Discharge date: 02/15/2014  Primary Care Physician:  [name],[name], MD  Discharge Diagnoses:   . Acute respiratory failure resolved  . Community-acquired pneumonia  . Acute on chronic diastolic CHF exacerbation  . Acute kidney injury due to diuresis  . Escherichia coli UTI (lower urinary tract infection) . PNA (pneumonia) . Underweight, Cachexia due to severe protein calorie malnutrition  . HLD (hyperlipidemia) . history of seizures    Consults:  Cardiology Dr. Jens Somrenshaw   Recommendations for Outpatient Follow-up:  Recheck BMET at the time of follow-up appointment  Check chest x-ray in 2-3 weeks to ensure complete resolution of pneumonia  TESTS THAT NEED FOLLOW-UP BMET   DIET: Heart healthy low-sodium diet    Allergies:  No Known Allergies   Discharge Medications:   Medication List    STOP taking these medications        atenolol 50 MG tablet  Commonly known as:  TENORMIN     cloNIDine 0.1 MG tablet  Commonly known as:  CATAPRES      TAKE these medications        acetaminophen 325 MG tablet  Commonly known as:  TYLENOL  Take 650 mg by mouth every 6 (six) hours as needed for headache.     aspirin 81 MG chewable tablet  Chew 1 tablet (81 mg total) by mouth daily.     atorvastatin 20 MG tablet  Commonly known as:  LIPITOR  Take 20 mg by mouth at bedtime. daily     azithromycin 500 MG tablet  Commonly known as:  ZITHROMAX  Take 1 tablet (500 mg total) by mouth daily. X 7 days     cefUROXime 500 MG tablet  Commonly known as:  CEFTIN  Take 1 tablet (500 mg total) by mouth 2 (two) times daily with a meal. X 1 week     [employer] VITAMIN B12 1000 MCG tablet  Generic drug:  cyanocobalamin  TAKE 1 TABLET (1,000 MCG TOTAL) BY MOUTH DAILY.     docusate sodium 100 MG capsule  Commonly known as:  COLACE  Take 100 mg by mouth daily as  needed for mild constipation.     furosemide 20 MG tablet  Commonly known as:  LASIX  Take 1 tablet (20 mg total) by mouth daily.  Start taking on:  02/19/2014     hydrALAZINE 50 MG tablet  Commonly known as:  APRESOLINE  Take 1 tablet (50 mg total) by mouth 3 (three) times daily.     HYDROcodone-acetaminophen 5-325 MG per tablet  Commonly known as:  NORCO/VICODIN  Take 2 tablets by mouth every 6 (six) hours as needed for moderate pain.     isosorbide mononitrate 60 MG 24 hr tablet  Commonly known as:  IMDUR  Take 1 tablet (60 mg total) by mouth daily.     levETIRAcetam 250 MG tablet  Commonly known as:  KEPPRA  TAKE 1 TABLET BY MOUTH TWICE A DAY     megestrol 40 MG tablet  Commonly known as:  MEGACE  TAKE 1 TABLET (40 MG TOTAL) BY MOUTH 2 (TWO) TIMES DAILY.     metoprolol succinate 50 MG 24 hr tablet  Commonly known as:  TOPROL-XL  Take 1 tablet (50 mg total) by mouth 2 (two) times daily. Take with or immediately following a meal.     mirtazapine 15 MG tablet  Commonly known as:  REMERON  Take 15 mg by mouth daily.     oxybutynin 5 MG 24 hr tablet  Commonly known as:  DITROPAN-XL  Take 5 mg by mouth daily.     senna 8.6 MG Tabs tablet  Commonly known as:  SENOKOT  Take 2 tablets (17.2 mg total) by mouth 2 (two) times daily.     sodium polystyrene 15 GM/60ML suspension  Commonly known as:  KAYEXALATE  Take 15 g by mouth 3 (three) times a week. Monday, Wednesday and Friday     tiZANidine 4 MG tablet  Commonly known as:  ZANAFLEX  Take 1 tablet (4 mg total) by mouth every 6 (six) hours as needed for muscle spasms.     Vitamin D-3 1000 UNITS Caps  Take 1,000 Units by mouth daily.         Brief H and P: For complete details please refer to admission H and P, but in brief Patient is a 80 year old female with hypertension, depression, anxiety, vitamin B12 deficiency, diabetes mellitus, chronic kidney disease, anorexia, ambulate minimally with wheelchair,  occasional walker presented to the ED with daughter secondary to tremors. Patient lives with her son at home and visited by daughter every day. She was noticed to be weak, shivering, shaking and fever of 100.4. Patient otherwise had no hypoxia or tachycardia. Chest x-ray showed vascular congestion with elevated BNP and caution about opacity/infiltrate. Urinalysis was positive for UTI. Patient was given IV Lasix and was started on IV Rocephin and Zithromax.  Hospital Course:  Acute respiratory failure secondary to community-acquired pneumonia and possible acute on chronic diastolic CHF, BNP of 14,197 1) Community-acquired pneumonia Patient was placed on IV Rocephin and Zithromax, incentive spirometry and bronchodilators. She has significantly improved clinically, transitioned to oral Zithromax and Ceftin. Please follow-up with chest x-ray in couple of weeks to ensure complete resolution of pneumonia.  2) acute on chronic diastolic CHF exacerbation - 2-D echo in 2013 showed EF 55-60% however repeat 2-D echo on 12/20 showed EF of 35-40% with grade 1 diastolic dysfunction moderate hypokinesis of the entire inferior myocardium, mild hypokinesis of the entire anteroseptal myocardium. The patient received IV Lasix in the ED and her creatinine worsened to 2.1 (patient also has chronic kidney disease stage III with baseline creatinine of 2.3-2.5 ). IV Lasix was discontinued and cardiology consult was obtained. Patient was seen by Dr. Jens Som who recommended medical management. Patient had a previous nuclear medicine study which was a low risk. Clonidine and Atenolol was discontinued per cardiology recommendations. Patient was started on Imdur and hydralazine which were titrated up and will need further adjustment at the follow-up appointment. Avoid ACE inhibitor secondary to baseline CKD. Due to bump in the creatinine, Dr. Delton See from cardiology recommended to hold Lasix for 3 days. She will need a repeat BMET at  the follow-up.   Seizure, convulsive - Continue Keppra   Cachexia, underweight, protein calorie malnutrition - on nutritional supplements - Continue Remeron, Megace - PTOT evaluation recommended supervision.  Escherichia coli UTI Urine culture and sensitivities showed Escherichia coli. Patient had received IV Rocephin during hospitalization and is being discharged on Ceftin which should cover UTI as well.  Hyperlipidemia Continue statin   Lower extremity sores - Per patient's daughter, due to swelling and blisters opened up, dressing intact for now, wound care consult was obtained and recommended silicone foam dressing to protect the wounds and change every 3 days. Patient was also recommended to we are her compression stockings and elevate legs.  Day of Discharge BP 175/79 mmHg  Pulse 75  Temp(Src) 98.5 F (36.9 C) (Oral)  Resp 18  Ht 5\' 3"  (1.6 m)  Wt 49.36 kg (108 lb 13.1 oz)  BMI 19.28 kg/m2  SpO2 96%  Physical Exam: General: Alert and awake oriented x3 not in any acute distress. CVS: S1-S2 clear no murmur rubs or gallops Chest: clear to auscultation bilaterally, no wheezing rales or rhonchi Abdomen: soft nontender, nondistended, normal bowel sounds Extremities: no cyanosis, clubbing or edema noted bilaterally, dressing intact    The results of significant diagnostics from this hospitalization (including imaging, microbiology, ancillary and laboratory) are listed below for reference.    LAB RESULTS: Basic Metabolic Panel:  Recent Labs Lab 02/14/14 1406 02/15/14 0426  NA 139 138  K 3.9 4.2  CL 98 101  CO2 30 29  GLUCOSE 165* 99  BUN 35* 34*  CREATININE 2.18* 2.23*  CALCIUM 8.4 8.3*   Liver Function Tests: No results for input(s): AST, ALT, ALKPHOS, BILITOT, PROT, ALBUMIN in the last 168 hours. No results for input(s): LIPASE, AMYLASE in the last 168 hours. No results for input(s): AMMONIA in the last 168 hours. CBC:  Recent Labs Lab  02/12/14 0851 02/13/14 0336 02/15/14 0426  WBC 8.5 4.6 5.4  NEUTROABS 6.7  --   --   HGB 10.4* 9.4* 9.7*  HCT 33.9* 29.4* 30.4*  MCV 94.7 96.7 94.7  PLT 148* 98* 103*   Cardiac Enzymes: No results for input(s): CKTOTAL, CKMB, CKMBINDEX, TROPONINI in the last 168 hours. BNP: Invalid input(s): POCBNP CBG:  Recent Labs Lab 02/15/14 0618 02/15/14 1118  GLUCAP 115* 139*    Significant Diagnostic Studies:  Dg Chest Port 1 View  02/12/2014   CLINICAL DATA:  One day history of productive cough  EXAM: PORTABLE CHEST - 1 VIEW  COMPARISON:  April 30, 2013  FINDINGS: There is cardiomegaly with pulmonary venous hypertension. There is generalized interstitial edema. There is consolidation in both lung bases with small left effusion. No adenopathy. No bone lesions.  IMPRESSION: Evidence of congestive heart failure. Question alveolar edema versus pneumonia in the lower lobes. Both entities may exist concurrently. Small left effusion present.   Electronically Signed   By: Bretta BangWilliam  Woodruff M.D.   On: 02/12/2014 09:20    2D ECHO: Study Conclusions  - Left ventricle: The cavity size was normal. Systolic function was moderately reduced. The estimated ejection fraction was in the range of 35% to 40%. There is moderate hypokinesis of the entireinferior myocardium. There is mild hypokinesis of the entireanteroseptal myocardium. Doppler parameters are consistent with abnormal left ventricular relaxation (grade 1 diastolic dysfunction). - Mitral valve: Calcified annulus. There was moderate regurgitation. - Left atrium: The atrium was moderately dilated. - Right ventricle: Systolic function was mildly reduced. - Right atrium: The atrium was mildly dilated. - Tricuspid valve: There was moderate regurgitation. - Pulmonary arteries: Systolic pressure was mildly to moderately increased. PA peak pressure: 38 mm Hg (S). - Pericardium, extracardiac: There was a left pleural effusion  and consolidation of left lower lung.  Disposition and Follow-up: Discharge Instructions    (HEART FAILURE PATIENTS) Call MD:  Anytime you have any of the following symptoms: 1) 3 pound weight gain in 24 hours or 5 pounds in 1 week 2) shortness of breath, with or without a dry hacking cough 3) swelling in the hands, feet or stomach 4) if you have to sleep on extra pillows at night in order to breathe.    Complete by:  As directed      Diet - low sodium heart healthy    Complete by:  As directed      Discharge instructions    Complete by:  As directed   Please restart Lasix after 3 days, starting on 02/19/14     Increase activity slowly    Complete by:  As directed             DISPOSITION: Home   DISCHARGE FOLLOW-UP Follow-up Information    Follow up with BURNETT,[name], MD. Schedule an appointment as soon as possible for a visit in 2 weeks.   Specialty:  Family Medicine   Why:  for hospital follow-up   Contact information:   [address] [place] [address] [phone]       Follow up with Wilburt Finlay, PA-C On 03/01/2014.   Specialty:  Physician Assistant   Why:  at 8AM , for hospital follow-up   Contact information:   [address] [place] [address] [phone]        Time spent on Discharge: 40 mins  Signed:   RAI,RIPUDEEP M.D. [facility] 02/15/2014, 1:17 PM Pager: [phone]

## 2014-02-16 ENCOUNTER — Observation Stay (HOSPITAL_COMMUNITY)
Admission: EM | Admit: 2014-02-16 | Discharge: 2014-02-17 | Disposition: A | Discharge status: Home / Self Care | Payer: Medicare Other | Attending: Internal Medicine | Admitting: Internal Medicine

## 2014-02-16 ENCOUNTER — Emergency Department (HOSPITAL_COMMUNITY): Payer: Medicare Other

## 2014-02-16 ENCOUNTER — Encounter (HOSPITAL_COMMUNITY): Payer: Self-pay | Admitting: Emergency Medicine

## 2014-02-16 DIAGNOSIS — R011 Cardiac murmur, unspecified: Secondary | ICD-10-CM | POA: Insufficient documentation

## 2014-02-16 DIAGNOSIS — M810 Age-related osteoporosis without current pathological fracture: Secondary | ICD-10-CM | POA: Insufficient documentation

## 2014-02-16 DIAGNOSIS — K5790 Diverticulosis of intestine, part unspecified, without perforation or abscess without bleeding: Secondary | ICD-10-CM | POA: Diagnosis not present

## 2014-02-16 DIAGNOSIS — N183 Chronic kidney disease, stage 3 unspecified: Secondary | ICD-10-CM | POA: Diagnosis present

## 2014-02-16 DIAGNOSIS — R531 Weakness: Secondary | ICD-10-CM | POA: Diagnosis present

## 2014-02-16 DIAGNOSIS — E785 Hyperlipidemia, unspecified: Secondary | ICD-10-CM | POA: Insufficient documentation

## 2014-02-16 DIAGNOSIS — D649 Anemia, unspecified: Secondary | ICD-10-CM | POA: Insufficient documentation

## 2014-02-16 DIAGNOSIS — R778 Other specified abnormalities of plasma proteins: Secondary | ICD-10-CM | POA: Diagnosis present

## 2014-02-16 DIAGNOSIS — F329 Major depressive disorder, single episode, unspecified: Secondary | ICD-10-CM | POA: Insufficient documentation

## 2014-02-16 DIAGNOSIS — R404 Transient alteration of awareness: Secondary | ICD-10-CM

## 2014-02-16 DIAGNOSIS — I129 Hypertensive chronic kidney disease with stage 1 through stage 4 chronic kidney disease, or unspecified chronic kidney disease: Secondary | ICD-10-CM | POA: Insufficient documentation

## 2014-02-16 DIAGNOSIS — N189 Chronic kidney disease, unspecified: Secondary | ICD-10-CM | POA: Diagnosis not present

## 2014-02-16 DIAGNOSIS — I214 Non-ST elevation (NSTEMI) myocardial infarction: Secondary | ICD-10-CM | POA: Diagnosis not present

## 2014-02-16 DIAGNOSIS — K5900 Constipation, unspecified: Secondary | ICD-10-CM | POA: Insufficient documentation

## 2014-02-16 DIAGNOSIS — R4701 Aphasia: Secondary | ICD-10-CM | POA: Diagnosis not present

## 2014-02-16 DIAGNOSIS — R9431 Abnormal electrocardiogram [ECG] [EKG]: Secondary | ICD-10-CM | POA: Diagnosis not present

## 2014-02-16 DIAGNOSIS — Z7982 Long term (current) use of aspirin: Secondary | ICD-10-CM | POA: Insufficient documentation

## 2014-02-16 DIAGNOSIS — E119 Type 2 diabetes mellitus without complications: Secondary | ICD-10-CM | POA: Insufficient documentation

## 2014-02-16 DIAGNOSIS — E538 Deficiency of other specified B group vitamins: Secondary | ICD-10-CM | POA: Insufficient documentation

## 2014-02-16 DIAGNOSIS — M1990 Unspecified osteoarthritis, unspecified site: Secondary | ICD-10-CM | POA: Diagnosis not present

## 2014-02-16 DIAGNOSIS — F419 Anxiety disorder, unspecified: Secondary | ICD-10-CM | POA: Insufficient documentation

## 2014-02-16 DIAGNOSIS — Z79899 Other long term (current) drug therapy: Secondary | ICD-10-CM | POA: Insufficient documentation

## 2014-02-16 DIAGNOSIS — R7989 Other specified abnormal findings of blood chemistry: Secondary | ICD-10-CM

## 2014-02-16 DIAGNOSIS — I10 Essential (primary) hypertension: Secondary | ICD-10-CM | POA: Diagnosis present

## 2014-02-16 DIAGNOSIS — R569 Unspecified convulsions: Secondary | ICD-10-CM

## 2014-02-16 LAB — I-STAT CHEM 8, ED
BUN: 31 mg/dL — ABNORMAL HIGH (ref 6–23)
Calcium, Ion: 1.09 mmol/L — ABNORMAL LOW (ref 1.13–1.30)
Chloride: 101 mEq/L (ref 96–112)
Creatinine, Ser: 2 mg/dL — ABNORMAL HIGH (ref 0.50–1.10)
GLUCOSE: 120 mg/dL — AB (ref 70–99)
HEMATOCRIT: 33 % — AB (ref 36.0–46.0)
HEMOGLOBIN: 11.2 g/dL — AB (ref 12.0–15.0)
POTASSIUM: 4.7 mmol/L (ref 3.5–5.1)
SODIUM: 139 mmol/L (ref 135–145)
TCO2: 26 mmol/L (ref 0–100)

## 2014-02-16 LAB — PROTIME-INR
INR: 1.1 (ref 0.00–1.49)
Prothrombin Time: 14.3 seconds (ref 11.6–15.2)

## 2014-02-16 LAB — CBC
HCT: 32.5 % — ABNORMAL LOW (ref 36.0–46.0)
Hemoglobin: 10.1 g/dL — ABNORMAL LOW (ref 12.0–15.0)
MCH: 29.3 pg (ref 26.0–34.0)
MCHC: 31.1 g/dL (ref 30.0–36.0)
MCV: 94.2 fL (ref 78.0–100.0)
Platelets: 130 10*3/uL — ABNORMAL LOW (ref 150–400)
RBC: 3.45 MIL/uL — ABNORMAL LOW (ref 3.87–5.11)
RDW: 14.1 % (ref 11.5–15.5)
WBC: 6.2 10*3/uL (ref 4.0–10.5)

## 2014-02-16 LAB — COMPREHENSIVE METABOLIC PANEL
ALT: 33 U/L (ref 0–35)
ANION GAP: 7 (ref 5–15)
AST: 29 U/L (ref 0–37)
Albumin: 3.2 g/dL — ABNORMAL LOW (ref 3.5–5.2)
Alkaline Phosphatase: 65 U/L (ref 39–117)
BUN: 30 mg/dL — ABNORMAL HIGH (ref 6–23)
CALCIUM: 8.8 mg/dL (ref 8.4–10.5)
CO2: 28 mmol/L (ref 19–32)
Chloride: 104 mEq/L (ref 96–112)
Creatinine, Ser: 2.15 mg/dL — ABNORMAL HIGH (ref 0.50–1.10)
GFR, EST AFRICAN AMERICAN: 24 mL/min — AB (ref >90)
GFR, EST NON AFRICAN AMERICAN: 21 mL/min — AB (ref >90)
GLUCOSE: 120 mg/dL — AB (ref 70–99)
Potassium: 4.8 mmol/L (ref 3.5–5.1)
Sodium: 139 mmol/L (ref 135–145)
Total Bilirubin: 0.6 mg/dL (ref 0.3–1.2)
Total Protein: 5.5 g/dL — ABNORMAL LOW (ref 6.0–8.3)

## 2014-02-16 LAB — DIFFERENTIAL
BASOS ABS: 0 10*3/uL (ref 0.0–0.1)
Basophils Relative: 1 % (ref 0–1)
Eosinophils Absolute: 0.4 10*3/uL (ref 0.0–0.7)
Eosinophils Relative: 6 % — ABNORMAL HIGH (ref 0–5)
LYMPHS PCT: 40 % (ref 12–46)
Lymphs Abs: 2.5 10*3/uL (ref 0.7–4.0)
MONO ABS: 0.6 10*3/uL (ref 0.1–1.0)
Monocytes Relative: 10 % (ref 3–12)
Neutro Abs: 2.7 10*3/uL (ref 1.7–7.7)
Neutrophils Relative %: 43 % (ref 43–77)

## 2014-02-16 LAB — I-STAT TROPONIN, ED: Troponin i, poc: 0.18 ng/mL (ref 0.00–0.08)

## 2014-02-16 LAB — APTT: aPTT: 28 seconds (ref 24–37)

## 2014-02-16 LAB — TROPONIN I: TROPONIN I: 0.24 ng/mL — AB (ref <0.031)

## 2014-02-16 LAB — CBG MONITORING, ED: GLUCOSE-CAPILLARY: 112 mg/dL — AB (ref 70–99)

## 2014-02-16 LAB — ETHANOL

## 2014-02-16 MED ORDER — ASPIRIN 81 MG PO CHEW
324.0000 mg | CHEWABLE_TABLET | Freq: Once | ORAL | Status: AC
  Administered 2014-02-16: 324 mg via ORAL
  Filled 2014-02-16: qty 4

## 2014-02-16 MED ORDER — HYDRALAZINE HCL 20 MG/ML IJ SOLN
10.0000 mg | Freq: Once | INTRAMUSCULAR | Status: AC
  Administered 2014-02-16: 10 mg via INTRAVENOUS
  Filled 2014-02-16: qty 1

## 2014-02-16 MED ORDER — LEVETIRACETAM 500 MG/5ML IV SOLN
250.0000 mg | Freq: Two times a day (BID) | INTRAVENOUS | Status: DC
  Administered 2014-02-16 – 2014-02-17 (×2): 250 mg via INTRAVENOUS
  Filled 2014-02-16 (×3): qty 2.5

## 2014-02-16 NOTE — ED Notes (Signed)
MD at bedside. 

## 2014-02-16 NOTE — Consult Note (Addendum)
Stroke Consult    Chief Complaint: unresponsiveness  HPI: [name] is an 80 y.o. female with history of seizures, HLD, CKD, CHF presenting with acute transient episode of unresponsiveness with question of aphasia. [facility] with diagnosis of CAP, discharged on ceftin and zithromax but had not taken doses yet today. Per family was eating dinner, at 2030 she became unresponsive for a brief period, unable to talk. Came to slowly and per her son she appeared mildly confused after before returning to baseline. No extremity twitching, no facial automatisms noted. Family notes she has had episodes similar to this in the past. They note her seizures typically involving shaking of her extremities.   Family notes she is "confused" at baseline and will have fluctuations in her mental status throughout the day. Takes keppra 250mg  BID for seizures, denies missing any doses.  Head CT imaging reviewed and overall unremarkable. BP noted to be 201/73 in the ED.  Date last known well: 02/16/2014 Time last known well: 2030 tPA Given: no, rapidly improving symptoms, NIHSS 1 in ED  Past Medical History  Diagnosis Date  . Hypertension   . Anxiety and depression   . Chronic headaches   . Diverticular disease   . Anemia   . Arthritis     osteoarthritis  . Osteoporosis   . Vitamin B 12 deficiency   . History of ITP   . Anxiety     takes xanax for sleep  . Diabetes mellitus     NIDDM x 12 years  . Heart murmur     benign;asymptomatic  . Chronic kidney disease     ESRD - AV fistula in LUE (Dr. Carlean Jewsynrthia Dunham = nephrologist)  . Left knee pain 02/08/2013  . Cachexia 02/08/2013  . Hyperlipidemia   . Tremors of nervous system   . Constipated 09/30/2013    Past Surgical History  Procedure Laterality Date  . Av fistula placement  11/15/10    left upper arm AVF  . Abdominal hysterectomy  1973  . Joint replacement  2007    rt hip  . Eye surgery    . Cataract extraction w/  intraocular lens  implant, bilateral  2002  . Vascular surgery  10-2010    left AVF  . Hernia repair  2000    ventral  . Ventral hernia repair  08/06/2011    Procedure: HERNIA REPAIR VENTRAL ADULT;  Surgeon: Liz MaladyBurke E Thompson, MD;  [facility];  Service: General;  Laterality: N/A;  Open Repair [employer] Hernia with Mesh  . Cholecystectomy  yrs ago  . Total hip arthroplasty Left 04/27/2013    Procedure: LEFT TOTAL HIP ARTHROPLASTY ANTERIOR APPROACH;  Surgeon: Shelda PalMatthew D Olin, MD;  Location: [facility] ORS;  Service: Orthopedics;  Laterality: Left;    Family History  Problem Relation Age of Onset  . Stroke Mother   . Heart disease Father    Social History:  reports that she has never smoked. Her smokeless tobacco use includes Snuff. She reports that she does not drink alcohol or use illicit drugs.  Allergies: No Known Allergies   (Not in a hospital admission)  ROS: Out of a complete 14 system review, the patient complains of only the following symptoms, and all other reviewed systems are negative.   Physical Examination: Filed Vitals:   02/16/14 2119  BP: 201/73  Pulse: 66  Temp: 97.6 F (36.4 C)  Resp: 13   Physical Exam  Constitutional: mildly cachectic, NAD Psych:  Affect appropriate to situation Eyes: No scleral injection HENT: No OP obstrucion Head: Normocephalic.  Cardiovascular: Normal rate and regular rhythm.  Respiratory: course breath sounds bilaterally GI: Soft. Bowel sounds are normal. No distension. There is no tenderness.  Skin: WDI  Neurologic Examination: Mental Status: Alert, oriented to name, date, location. Follows simple and multistep commands. Fluctuating level of mental status (family states this is her baseline)  Speech fluent without evidence of aphasia.  Cranial Nerves: II: unable to visualize fundi due to pupil size, visual fields grossly normal, pupils equal, round, reactive to light III,IV, VI: ptosis not present, extra-ocular motions  intact bilaterally V,VII: smile symmetric, facial light touch sensation normal bilaterally VIII: hearing normal bilaterally IX,X: gag reflex present XI: trapezius strength/neck flexion strength normal bilaterally XII: tongue strength normal  Motor: 5/5 strength in all extremities Mild to moderate resting and postural tremor noted in bilateral hands Tone and bulk:normal tone throughout; no atrophy noted Sensory: mild decrease in LT in LLE, otherwise intact throughout Deep Tendon Reflexes: 1+ and symmetric throughout Plantars: Right: downgoing   Left: downgoing Cerebellar: Normal FTN and HTS bilaterally Gait: deferred due to multiple leads in the ED  Laboratory Studies:   Basic Metabolic Panel:  Recent Labs Lab 02/12/14 0851 02/13/14 0336 02/14/14 1406 02/15/14 0426 02/16/14 2112  NA 142 138 139 138 139  K 4.1 3.9 3.9 4.2 4.7  CL 102 98 98 101 101  CO2 26 30 30 29   --   GLUCOSE 120* 98 165* 99 120*  BUN 23 26* 35* 34* 31*  CREATININE 1.58* 2.12* 2.18* 2.23* 2.00*  CALCIUM 8.9 8.5 8.4 8.3*  --     Liver Function Tests: No results for input(s): AST, ALT, ALKPHOS, BILITOT, PROT, ALBUMIN in the last 168 hours. No results for input(s): LIPASE, AMYLASE in the last 168 hours. No results for input(s): AMMONIA in the last 168 hours.  CBC:  Recent Labs Lab 02/12/14 0851 02/13/14 0336 02/15/14 0426 02/16/14 2102 02/16/14 2112  WBC 8.5 4.6 5.4 6.2  --   NEUTROABS 6.7  --   --  2.7  --   HGB 10.4* 9.4* 9.7* 10.1* 11.2*  HCT 33.9* 29.4* 30.4* 32.5* 33.0*  MCV 94.7 96.7 94.7 94.2  --   PLT 148* 98* 103* 130*  --     Cardiac Enzymes: No results for input(s): CKTOTAL, CKMB, CKMBINDEX, TROPONINI in the last 168 hours.  BNP: Invalid input(s): POCBNP  CBG:  Recent Labs Lab 02/14/14 1633 02/14/14 2050 02/15/14 0618 02/15/14 1118 02/16/14 2059  GLUCAP 110* 132* 115* 139* 112*    Microbiology: Results for orders placed or performed during the hospital encounter  of 02/12/14  Urine culture     Status: None   Collection Time: 02/12/14  9:41 AM  Result Value Ref Range Status   Specimen Description URINE, CATHETERIZED  Final   Special Requests NONE  Final   Culture  Setup Time   Final    02/12/2014 11:31 Performed at [facility]    Colony Count   Final    >=100,000 COLONIES/ML Performed at [facility]    Culture   Final    ESCHERICHIA COLI Performed at [facility]    Report Status 02/15/2014 FINAL  Final   Organism ID, Bacteria ESCHERICHIA COLI  Final      Susceptibility   Escherichia coli - MIC*    AMPICILLIN >=32 RESISTANT Resistant     CEFAZOLIN <=4 SENSITIVE Sensitive     CEFTRIAXONE <=  1 SENSITIVE Sensitive     CIPROFLOXACIN >=4 RESISTANT Resistant     GENTAMICIN <=1 SENSITIVE Sensitive     LEVOFLOXACIN >=8 RESISTANT Resistant     NITROFURANTOIN <=16 SENSITIVE Sensitive     TOBRAMYCIN <=1 SENSITIVE Sensitive     TRIMETH/SULFA <=20 SENSITIVE Sensitive     PIP/TAZO <=4 SENSITIVE Sensitive     * ESCHERICHIA COLI    Coagulation Studies: No results for input(s): LABPROT, INR in the last 72 hours.  Urinalysis:  Recent Labs Lab 02/12/14 0941  COLORURINE YELLOW  LABSPEC 1.025  PHURINE 6.5  GLUCOSEU NEGATIVE  HGBUR MODERATE*  BILIRUBINUR NEGATIVE  KETONESUR NEGATIVE  PROTEINUR 100*  UROBILINOGEN 0.2  NITRITE POSITIVE*  LEUKOCYTESUR TRACE*    Lipid Panel:     Component Value Date/Time   CHOL  02/01/2009 0420    182        ATP III CLASSIFICATION:  <200     mg/dL   Desirable  161-096  mg/dL   Borderline High  >=045    mg/dL   High          TRIG 85 02/01/2009 0420   HDL 106 02/01/2009 0420   CHOLHDL 1.7 02/01/2009 0420   VLDL 17 02/01/2009 0420   LDLCALC  02/01/2009 0420    59        Total Cholesterol/HDL:CHD Risk Coronary Heart Disease Risk Table                     Men   Women  1/2 Average Risk   3.4   3.3  Average Risk       5.0   4.4  2 X Average Risk   9.6   7.1  3 X Average Risk   23.4   11.0        Use the calculated Patient Ratio above and the CHD Risk Table to determine the patient's CHD Risk.        ATP III CLASSIFICATION (LDL):  <100     mg/dL   Optimal  409-811  mg/dL   Near or Above                    Optimal  130-159  mg/dL   Borderline  914-782  mg/dL   High  >956     mg/dL   Very High    [id number]:  Lab Results  Component Value Date   HGBA1C * 05/22/2009    7.1 (NOTE) The ADA recommends the following therapeutic goal for glycemic control related to Hgb A1c measurement: Goal of therapy: <6.5 Hgb A1c  Reference: American Diabetes Association: Clinical Practice Recommendations 2010, Diabetes Care, 2010, 33: (Suppl  1).    Urine Drug Screen:  No results found for: LABOPIA, COCAINSCRNUR, LABBENZ, AMPHETMU, THCU, LABBARB  Alcohol Level: No results for input(s): ETH in the last 168 hours.  Other results:  Imaging: Ct Head Wo Contrast  02/16/2014   CLINICAL DATA:  Code stroke, found unresponsive, left-sided weakness  EXAM: CT HEAD WITHOUT CONTRAST  TECHNIQUE: Contiguous axial images were obtained from the base of the skull through the vertex without intravenous contrast.  COMPARISON:  03/04/2013  FINDINGS: Mild cortical volume loss noted with proportional ventricular prominence. Areas of periventricular white matter hypodensity are most compatible with small vessel ischemic change. No acute hemorrhage, infarct, or mass lesion is identified. No midline shift. Orbits and paranasal sinuses are unremarkable. Prominence of the skull diploic space is identified which may be associated  with long-term use of anti seizure medication.  IMPRESSION: No acute intracranial finding.  These results were called by telephone at the time of interpretation on 02/16/2014 at 9:18 pm to Dr. Elspeth Cho, who verbally acknowledged these results.   Electronically Signed   By: Christiana Pellant M.D.   On: 02/16/2014 21:19    Assessment: 80 y.o. female with history of seizure disorder,  HLD, recent discharge (12/22) with CAP presenting for transient period of unresponsiveness. Unclear etiology but suspect seizure and post-ictal state. Underlying PNA may have lowered her seizure threshold. Low suspicion that this represents a TIA based on clinical history. BP noted to be markedly elevated in ED. Physical exam overall unremarkable.   Would admit for overnight observation. If she remains neurologically stable and BP is under control then no further neurological workup indicated.    Plan: 1. Given IV bolus 250mg  Keppra in ED 2. Continue current keppra dose of 250mg  BID 3. Gradual lowering of BP  4. Continue PNA treatment 5. Would hold on MRI brain at this point     Elspeth Cho, DO [facility] [phone]  If 7pm- 7am, please page neurology on call as listed in AMION. 02/16/2014, 9:36 PM

## 2014-02-16 NOTE — ED Notes (Signed)
Repeat EKG shown to Dr. Bebe ShaggyWickline.

## 2014-02-16 NOTE — ED Provider Notes (Signed)
CSN: [id number]     Arrival date & time 02/16/14  2056 History   First MD Initiated Contact with Patient 02/16/14 2100     Chief Complaint  Patient presents with  . Code Stroke    @EDPCLEARED @ (Consider location/radiation/quality/duration/timing/severity/associated sxs/prior Treatment) Patient is a [age] y.o. female presenting with neurologic complaint.  Neurologic Problem This is a new problem. The current episode started today. The problem occurs constantly. The problem has been resolved. Associated symptoms include weakness. Pertinent negatives include no abdominal pain, arthralgias, change in bowel habit, chest pain, chills, congestion, coughing, diaphoresis, fever, headaches, neck pain, numbness, rash, sore throat, urinary symptoms, visual change or vomiting. Associated symptoms comments: Aphasia and left sided weakness lasting 30 minutes . Nothing aggravates the symptoms. She has tried nothing for the symptoms.    Past Medical History  Diagnosis Date  . Hypertension   . Anxiety and depression   . Chronic headaches   . Diverticular disease   . Anemia   . Arthritis     osteoarthritis  . Osteoporosis   . Vitamin B 12 deficiency   . History of ITP   . Anxiety     takes xanax for sleep  . Diabetes mellitus     NIDDM x 12 years  . Heart murmur     benign;asymptomatic  . Chronic kidney disease     ESRD - AV fistula in LUE (Dr. Carlean Jewsynrthia Dunham = nephrologist)  . Left knee pain 02/08/2013  . Cachexia 02/08/2013  . Hyperlipidemia   . Tremors of nervous system   . Constipated 09/30/2013   Past Surgical History  Procedure Laterality Date  . Av fistula placement  11/15/10    left upper arm AVF  . Abdominal hysterectomy  1973  . Joint replacement  2007    rt hip  . Eye surgery    . Cataract extraction w/ intraocular lens  implant, bilateral  2002  . Vascular surgery  10-2010    left AVF  . Hernia repair  2000    ventral  . Ventral hernia repair  08/06/2011    Procedure:  HERNIA REPAIR VENTRAL ADULT;  Surgeon: Liz MaladyBurke E Thompson, MD;  [facility];  Service: General;  Laterality: N/A;  Open Repair [employer]. Hernia with Mesh  . Cholecystectomy  yrs ago  . Total hip arthroplasty Left 04/27/2013    Procedure: LEFT TOTAL HIP ARTHROPLASTY ANTERIOR APPROACH;  Surgeon: Shelda PalMatthew D Olin, MD;  Location: [facility] ORS;  Service: Orthopedics;  Laterality: Left;   Family History  Problem Relation Age of Onset  . Stroke Mother   . Heart disease Father    History  Substance Use Topics  . Smoking status: Never Smoker   . Smokeless tobacco: Current User    Types: Snuff  . Alcohol Use: No   OB History    No data available     Review of Systems  Constitutional: Negative for fever, chills and diaphoresis.  HENT: Negative for congestion and sore throat.   Eyes: Negative for visual disturbance.  Respiratory: Negative for cough and shortness of breath.   Cardiovascular: Negative for chest pain.  Gastrointestinal: Negative for vomiting, abdominal pain and change in bowel habit.  Genitourinary: Negative for difficulty urinating.  Musculoskeletal: Negative for back pain, arthralgias and neck pain.  Skin: Negative for rash.  Neurological: Positive for weakness. Negative for syncope, numbness and headaches.      Allergies  Review of patient's allergies indicates no known allergies.  Home Medications  Prior to Admission medications   Medication Sig Start Date End Date Taking? Authorizing Provider  acetaminophen (TYLENOL) 325 MG tablet Take 650 mg by mouth every 6 (six) hours as needed for headache.   Yes Historical Provider, MD  aspirin 81 MG chewable tablet Chew 1 tablet (81 mg total) by mouth daily. 02/15/14  Yes Ripudeep Jenna Luo, MD  atorvastatin (LIPITOR) 20 MG tablet Take 20 mg by mouth at bedtime. daily 01/08/12  Yes Historical Provider, MD  azithromycin (ZITHROMAX) 500 MG tablet Take 1 tablet (500 mg total) by mouth daily. X 7 days 02/15/14  Yes Ripudeep Jenna Luo, MD   cefUROXime (CEFTIN) 500 MG tablet Take 1 tablet (500 mg total) by mouth 2 (two) times daily with a meal. X 1 week 02/15/14  Yes Ripudeep K Rai, MD  Cholecalciferol (VITAMIN D-3) 1000 UNITS CAPS Take 1,000 Units by mouth daily.   Yes Historical Provider, MD  [employer] VITAMIN B12 1000 MCG tablet TAKE 1 TABLET (1,000 MCG TOTAL) BY MOUTH DAILY. 08/22/13  Yes Artis Delay, MD  docusate sodium (COLACE) 100 MG capsule Take 100 mg by mouth daily as needed for mild constipation.   Yes Historical Provider, MD  furosemide (LASIX) 20 MG tablet Take 1 tablet (20 mg total) by mouth daily. 02/19/14  Yes Ripudeep Jenna Luo, MD  hydrALAZINE (APRESOLINE) 50 MG tablet Take 1 tablet (50 mg total) by mouth 3 (three) times daily. 02/15/14  Yes Ripudeep Jenna Luo, MD  HYDROcodone-acetaminophen (NORCO/VICODIN) 5-325 MG per tablet Take 2 tablets by mouth every 6 (six) hours as needed for moderate pain.   Yes Historical Provider, MD  isosorbide mononitrate (IMDUR) 60 MG 24 hr tablet Take 1 tablet (60 mg total) by mouth daily. 02/15/14  Yes Ripudeep Jenna Luo, MD  megestrol (MEGACE) 40 MG tablet TAKE 1 TABLET (40 MG TOTAL) BY MOUTH 2 (TWO) TIMES DAILY. 07/22/13  Yes Artis Delay, MD  metoprolol succinate (TOPROL-XL) 50 MG 24 hr tablet Take 1 tablet (50 mg total) by mouth 2 (two) times daily. Take with or immediately following a meal. 02/15/14  Yes Ripudeep K Rai, MD  mirtazapine (REMERON) 15 MG tablet Take 15 mg by mouth daily. 09/20/13  Yes Historical Provider, MD  oxybutynin (DITROPAN-XL) 5 MG 24 hr tablet Take 5 mg by mouth daily.   Yes Historical Provider, MD  senna (SENOKOT) 8.6 MG TABS tablet Take 2 tablets (17.2 mg total) by mouth 2 (two) times daily. 09/30/13  Yes Artis Delay, MD  sodium polystyrene (KAYEXALATE) 15 GM/60ML suspension Take 15 g by mouth 3 (three) times a week. Monday, Wednesday and Friday   Yes Historical Provider, MD  tiZANidine (ZANAFLEX) 4 MG tablet Take 1 tablet (4 mg total) by mouth every 6 (six) hours as needed for muscle  spasms. 04/29/13  Yes Genelle Gather Babish, PA-C  levETIRAcetam (KEPPRA) 250 MG tablet Take 1 tablet (250 mg total) by mouth 2 (two) times daily. Please take 2 tablets oral twice daily (500 mg oral twice daily), for the next 3 days ( till 02/19/14) , then resume 1 tablet oral (250 mg) twice daily from 12/27 thereafter. 02/17/14   Dawood Elgergawy, MD   BP 166/66 mmHg  Pulse 59  Temp(Src) 97.8 F (36.6 C) (Oral)  Resp 18  Ht 5\' 3"  (1.6 m)  Wt 108 lb 11.2 oz (49.306 kg)  BMI 19.26 kg/m2  SpO2 95% Physical Exam  Constitutional: [name] oriented to person, place, and time. She appears well-developed and well-nourished. No distress.  HENT:  Head: Normocephalic and atraumatic.  Mouth/Throat: No oropharyngeal exudate.  Eyes: Conjunctivae and EOM are normal. Pupils are equal, round, and reactive to light.  Neck: Normal range of motion.  Cardiovascular: Normal rate, regular rhythm, normal heart sounds and intact distal pulses.  Exam reveals no gallop and no friction rub.   No murmur heard. Pulmonary/Chest: Effort normal and breath sounds normal. No respiratory distress. She has no wheezes. She has no rales.  Abdominal: Soft. She exhibits no distension. There is no tenderness. There is no guarding.  Musculoskeletal: She exhibits no edema or tenderness.  Neurological: [name] alert and oriented to person, place, and time. She has normal strength. She displays tremor. No cranial nerve deficit or sensory deficit. Coordination normal. GCS eye subscore is 4. GCS verbal subscore is 5. GCS motor subscore is 6.  Skin: Skin is warm and dry. No rash noted. [name] not diaphoretic. No erythema.  Skin tear LE without surrounding erythema   Nursing note and vitals reviewed.   ED Course  Procedures (including critical care time) Labs Review Labs Reviewed  CBC - Abnormal; Notable for the following:    RBC 3.45 (*)    Hemoglobin 10.1 (*)    HCT 32.5 (*)    Platelets 130 (*)    All other components within  normal limits  DIFFERENTIAL - Abnormal; Notable for the following:    Eosinophils Relative 6 (*)    All other components within normal limits  COMPREHENSIVE METABOLIC PANEL - Abnormal; Notable for the following:    Glucose, Bld 120 (*)    BUN 30 (*)    Creatinine, Ser 2.15 (*)    Total Protein 5.5 (*)    Albumin 3.2 (*)    GFR calc non Af Amer 21 (*)    GFR calc Af Amer 24 (*)    All other components within normal limits  TROPONIN I - Abnormal; Notable for the following:    Troponin I 0.24 (*)    All other components within normal limits  TROPONIN I - Abnormal; Notable for the following:    Troponin I 0.18 (*)    All other components within normal limits  TROPONIN I - Abnormal; Notable for the following:    Troponin I 0.14 (*)    All other components within normal limits  COMPREHENSIVE METABOLIC PANEL - Abnormal; Notable for the following:    BUN 29 (*)    Creatinine, Ser 2.08 (*)    Total Protein 4.8 (*)    Albumin 2.8 (*)    GFR calc non Af Amer 22 (*)    GFR calc Af Amer 25 (*)    All other components within normal limits  CBC WITH DIFFERENTIAL - Abnormal; Notable for the following:    RBC 3.17 (*)    Hemoglobin 9.4 (*)    HCT 30.1 (*)    Platelets 108 (*)    Neutrophils Relative % 40 (*)    Neutro Abs 1.6 (*)    Eosinophils Relative 8 (*)    All other components within normal limits  CBG MONITORING, ED - Abnormal; Notable for the following:    Glucose-Capillary 112 (*)    All other components within normal limits  I-STAT CHEM 8, ED - Abnormal; Notable for the following:    BUN 31 (*)    Creatinine, Ser 2.00 (*)    Glucose, Bld 120 (*)    Calcium, Ion 1.09 (*)    Hemoglobin 11.2 (*)    HCT 33.0 (*)  All other components within normal limits  I-STAT TROPOININ, ED - Abnormal; Notable for the following:    Troponin i, poc 0.18 (*)    All other components within normal limits  ETHANOL  PROTIME-INR  APTT  URINALYSIS, ROUTINE W REFLEX MICROSCOPIC  TROPONIN I   CBC  I-STAT TROPOININ, ED    Imaging Review Ct Head Wo Contrast  02/16/2014   CLINICAL DATA:  Code stroke, found unresponsive, left-sided weakness  EXAM: CT HEAD WITHOUT CONTRAST  TECHNIQUE: Contiguous axial images were obtained from the base of the skull through the vertex without intravenous contrast.  COMPARISON:  03/04/2013  FINDINGS: Mild cortical volume loss noted with proportional ventricular prominence. Areas of periventricular white matter hypodensity are most compatible with small vessel ischemic change. No acute hemorrhage, infarct, or mass lesion is identified. No midline shift. Orbits and paranasal sinuses are unremarkable. Prominence of the skull diploic space is identified which may be associated with long-term use of anti seizure medication.  IMPRESSION: No acute intracranial finding.  These results were called by telephone at the time of interpretation on 02/16/2014 at 9:18 pm to Dr. Elspeth ChoPeter Sumner, who verbally acknowledged these results.   Electronically Signed   By: Christiana PellantGretchen  Green M.D.   On: 02/16/2014 21:19     EKG Interpretation   Date/Time:  Wednesday February 16 2014 22:24:40 EST Ventricular Rate:  58 PR Interval:  168 QRS Duration: 97 QT Interval:  498 QTC Calculation: 489 R Axis:   -12 Text Interpretation:  Sinus rhythm LVH with secondary repolarization  abnormality Anterior Q waves, possibly due to LVH changes noted from prior  biphasic T wave in anterior leads Confirmed by Bebe ShaggyWICKLINE  MD, Dorinda HillNALD  [phone]) on 02/16/2014 10:48:10 PM      MDM   Final diagnoses:  Non-STEMI (non-ST elevated myocardial infarction)  Abnormal EKG  Seizure    80 year old female with a history of CK D with fistula in place, question of seizure versus tremor, hyperlipidemia, hypertension with recent admission and discharge for concern of pneumonia and urinary tract infection presents with concern of episode of left sided weakness and aphasia. Patient was a code stroke called by EMS.  She had approximately 30 minutes of symptoms with resolution 10 minutes after EMS arrival.  Her glucose was within normal limits. On arrival to the emergency department patient is neurologically at baseline.  A CT head was done which did not show any acute findings. Neurology evaluated patient and felt that presentation was more consistent with seizure and stroke or TIA.  They gave the patient a dose of Keppra and recommended hospitalist admission.  Patient was noted to have a positive troponin of 0.24 and was noted to have dynamic EKG changes.  She was given 324 of aspirin and cardiology was consulted for concern is positive troponin and biphasic T waves in the anterior leads with depressions in 1 and aVL.  The patient remained symptom-free and chest pain-free at this time.  Hospitalist admitted for further evaluation.    Rhae LernerErin Elizabeth Derwood Becraft, MD 02/17/14 1314  Joya Gaskinsonald W Wickline, MD 02/17/14 1434

## 2014-02-16 NOTE — ED Provider Notes (Signed)
D/w dr whitlock with cardiology We discussed patient He reviewed EKG and labs He will see patient He requests giving ASA No further recommendations at this time   EKG Interpretation  Date/Time:  Wednesday February 16 2014 22:24:40 EST Ventricular Rate:  58 PR Interval:  168 QRS Duration: 97 QT Interval:  498 QTC Calculation: 489 R Axis:   -12 Text Interpretation:  Sinus rhythm LVH with secondary repolarization abnormality Anterior Q waves, possibly due to LVH changes noted from prior biphasic T wave in anterior leads Confirmed by Bebe ShaggyWICKLINE  MD, Tierrah Anastos ([phone]) on 02/16/2014 10:48:10 PM        Joya Gaskinsonald W Abdulrahim Siddiqi, MD 02/16/14 2251

## 2014-02-16 NOTE — ED Notes (Signed)
Family at bedside. 

## 2014-02-16 NOTE — ED Notes (Addendum)
Pt arrives via [facility], pt at home, eating dinner with son, son states pt became unresponsive right after 2030. Upon ems arrival, pt had left sided weakness,aphasia x 10 mins. Upon arrival to ed. Pt returned to baseline, alert, oriented, no deficits present.hx of seizures, pt on keppra.

## 2014-02-16 NOTE — ED Provider Notes (Signed)
Pt now back to baseline She denies any CP/SOB However, her troponin is elevated and EKG is abnormal Cardiology consulted    Date: 02/16/2014 2224  Rate: 58  Rhythm: normal sinus rhythm  QRS Axis: normal  Intervals: normal  ST/T Wave abnormalities: biphasic t wave in anterior leads  Conduction Disutrbances:none  Narrative Interpretation:   Old EKG Reviewed: changes noted    Joya Gaskinsonald W Unknown Flannigan, MD 02/16/14 2236

## 2014-02-16 NOTE — ED Notes (Signed)
Notified MD and RN of elevated I-stat trop. of 0.18

## 2014-02-16 NOTE — Code Documentation (Signed)
Olivia JacobsonHelen is a 80 yo wf presenting to the ED for sudden onset episode of unresponsiveness & aphasia.  [facility] had become more responsive & was able to answer questions.  She has a baseline tremor, dementia, & sz disorder.  NIH 1 for sensory deficit.

## 2014-02-16 NOTE — ED Notes (Signed)
Canceled Code Stroke @ 2133

## 2014-02-16 NOTE — ED Provider Notes (Signed)
Pt seen on arrival for code stroke Pt awake/alert but she had left sided deficits Neuro at bedside   Date: 02/16/2014 2118  Rate: 65  Rhythm: normal sinus rhythm  QRS Axis: normal  Intervals: normal  ST/T Wave abnormalities: nonspecific ST changes  Conduction Disutrbances:none  Narrative Interpretation: artifact noted     Joya Gaskinsonald W Jadia Capers, MD 02/16/14 2125

## 2014-02-17 ENCOUNTER — Other Ambulatory Visit: Payer: Self-pay | Admitting: Physician Assistant

## 2014-02-17 ENCOUNTER — Encounter (HOSPITAL_COMMUNITY): Payer: Self-pay | Admitting: Internal Medicine

## 2014-02-17 DIAGNOSIS — N183 Chronic kidney disease, stage 3 unspecified: Secondary | ICD-10-CM | POA: Diagnosis present

## 2014-02-17 DIAGNOSIS — I5020 Unspecified systolic (congestive) heart failure: Secondary | ICD-10-CM

## 2014-02-17 DIAGNOSIS — R778 Other specified abnormalities of plasma proteins: Secondary | ICD-10-CM | POA: Diagnosis present

## 2014-02-17 DIAGNOSIS — I10 Essential (primary) hypertension: Secondary | ICD-10-CM | POA: Diagnosis present

## 2014-02-17 DIAGNOSIS — I214 Non-ST elevation (NSTEMI) myocardial infarction: Secondary | ICD-10-CM | POA: Diagnosis not present

## 2014-02-17 DIAGNOSIS — R569 Unspecified convulsions: Secondary | ICD-10-CM

## 2014-02-17 DIAGNOSIS — I5022 Chronic systolic (congestive) heart failure: Secondary | ICD-10-CM

## 2014-02-17 DIAGNOSIS — I255 Ischemic cardiomyopathy: Secondary | ICD-10-CM

## 2014-02-17 DIAGNOSIS — R7989 Other specified abnormal findings of blood chemistry: Secondary | ICD-10-CM

## 2014-02-17 LAB — COMPREHENSIVE METABOLIC PANEL
ALBUMIN: 2.8 g/dL — AB (ref 3.5–5.2)
ALK PHOS: 56 U/L (ref 39–117)
ALT: 27 U/L (ref 0–35)
AST: 23 U/L (ref 0–37)
Anion gap: 7 (ref 5–15)
BILIRUBIN TOTAL: 0.3 mg/dL (ref 0.3–1.2)
BUN: 29 mg/dL — ABNORMAL HIGH (ref 6–23)
CHLORIDE: 103 meq/L (ref 96–112)
CO2: 27 mmol/L (ref 19–32)
Calcium: 8.4 mg/dL (ref 8.4–10.5)
Creatinine, Ser: 2.08 mg/dL — ABNORMAL HIGH (ref 0.50–1.10)
GFR calc Af Amer: 25 mL/min — ABNORMAL LOW (ref >90)
GFR, EST NON AFRICAN AMERICAN: 22 mL/min — AB (ref >90)
Glucose, Bld: 88 mg/dL (ref 70–99)
POTASSIUM: 4.5 mmol/L (ref 3.5–5.1)
Sodium: 137 mmol/L (ref 135–145)
Total Protein: 4.8 g/dL — ABNORMAL LOW (ref 6.0–8.3)

## 2014-02-17 LAB — CBC WITH DIFFERENTIAL/PLATELET
BASOS ABS: 0 10*3/uL (ref 0.0–0.1)
Basophils Relative: 1 % (ref 0–1)
EOS ABS: 0.3 10*3/uL (ref 0.0–0.7)
EOS PCT: 8 % — AB (ref 0–5)
HEMATOCRIT: 30.1 % — AB (ref 36.0–46.0)
Hemoglobin: 9.4 g/dL — ABNORMAL LOW (ref 12.0–15.0)
Lymphocytes Relative: 38 % (ref 12–46)
Lymphs Abs: 1.5 10*3/uL (ref 0.7–4.0)
MCH: 29.7 pg (ref 26.0–34.0)
MCHC: 31.2 g/dL (ref 30.0–36.0)
MCV: 95 fL (ref 78.0–100.0)
Monocytes Absolute: 0.5 10*3/uL (ref 0.1–1.0)
Monocytes Relative: 12 % (ref 3–12)
Neutro Abs: 1.6 10*3/uL — ABNORMAL LOW (ref 1.7–7.7)
Neutrophils Relative %: 40 % — ABNORMAL LOW (ref 43–77)
Platelets: 108 10*3/uL — ABNORMAL LOW (ref 150–400)
RBC: 3.17 MIL/uL — ABNORMAL LOW (ref 3.87–5.11)
RDW: 14 % (ref 11.5–15.5)
WBC: 4 10*3/uL (ref 4.0–10.5)

## 2014-02-17 LAB — TROPONIN I
TROPONIN I: 0.18 ng/mL — AB (ref <0.031)
TROPONIN I: 0.14 ng/mL — AB (ref <0.031)

## 2014-02-17 MED ORDER — ATORVASTATIN CALCIUM 20 MG PO TABS: 20.0000 mg | ORAL_TABLET | Freq: Every day | ORAL | Status: DC

## 2014-02-17 MED ORDER — VITAMIN D3 25 MCG (1000 UNIT) PO TABS
1000.0000 [IU] | ORAL_TABLET | Freq: Every day | ORAL | Status: DC
  Administered 2014-02-17: 1000 [IU] via ORAL
  Filled 2014-02-17 (×2): qty 1

## 2014-02-17 MED ORDER — ONDANSETRON HCL 4 MG/2ML IJ SOLN: 4.0000 mg | Freq: Four times a day (QID) | INTRAMUSCULAR | Status: DC | PRN

## 2014-02-17 MED ORDER — SODIUM CHLORIDE 0.9 % IJ SOLN: 3.0000 mL | Freq: Two times a day (BID) | INTRAMUSCULAR | Status: DC

## 2014-02-17 MED ORDER — ISOSORBIDE MONONITRATE ER 60 MG PO TB24
60.0000 mg | ORAL_TABLET | Freq: Every day | ORAL | Status: DC
  Administered 2014-02-17: 60 mg via ORAL
  Filled 2014-02-17: qty 1

## 2014-02-17 MED ORDER — METOPROLOL SUCCINATE ER 50 MG PO TB24
50.0000 mg | ORAL_TABLET | Freq: Two times a day (BID) | ORAL | Status: DC
  Administered 2014-02-17: 50 mg via ORAL
  Filled 2014-02-17: qty 1

## 2014-02-17 MED ORDER — ENOXAPARIN SODIUM 30 MG/0.3ML ~~LOC~~ SOLN
30.0000 mg | SUBCUTANEOUS | Status: DC
  Administered 2014-02-17: 30 mg via SUBCUTANEOUS
  Filled 2014-02-17: qty 0.3

## 2014-02-17 MED ORDER — DOCUSATE SODIUM 100 MG PO CAPS
100.0000 mg | ORAL_CAPSULE | Freq: Every day | ORAL | Status: DC | PRN
  Filled 2014-02-17: qty 1

## 2014-02-17 MED ORDER — SODIUM POLYSTYRENE SULFONATE 15 GM/60ML PO SUSP: 15.0000 g | ORAL | Status: DC

## 2014-02-17 MED ORDER — SENNA 8.6 MG PO TABS
2.0000 | ORAL_TABLET | Freq: Two times a day (BID) | ORAL | Status: DC
  Administered 2014-02-17: 17.2 mg via ORAL
  Filled 2014-02-17: qty 2

## 2014-02-17 MED ORDER — ONDANSETRON HCL 4 MG PO TABS: 4.0000 mg | ORAL_TABLET | Freq: Four times a day (QID) | ORAL | Status: DC | PRN

## 2014-02-17 MED ORDER — HYDRALAZINE HCL 20 MG/ML IJ SOLN: 10.0000 mg | INTRAMUSCULAR | Status: DC | PRN

## 2014-02-17 MED ORDER — OXYBUTYNIN CHLORIDE ER 5 MG PO TB24
5.0000 mg | ORAL_TABLET | Freq: Every day | ORAL | Status: DC
  Administered 2014-02-17: 5 mg via ORAL
  Filled 2014-02-17: qty 1

## 2014-02-17 MED ORDER — ACETAMINOPHEN 325 MG PO TABS: 650.0000 mg | ORAL_TABLET | Freq: Four times a day (QID) | ORAL | Status: DC | PRN

## 2014-02-17 MED ORDER — AZITHROMYCIN 250 MG PO TABS
500.0000 mg | ORAL_TABLET | Freq: Every day | ORAL | Status: DC
  Administered 2014-02-17: 500 mg via ORAL
  Filled 2014-02-17: qty 2

## 2014-02-17 MED ORDER — ACETAMINOPHEN 650 MG RE SUPP: 650.0000 mg | Freq: Four times a day (QID) | RECTAL | Status: DC | PRN

## 2014-02-17 MED ORDER — TIZANIDINE HCL 4 MG PO TABS
4.0000 mg | ORAL_TABLET | Freq: Four times a day (QID) | ORAL | Status: DC | PRN
  Filled 2014-02-17: qty 1

## 2014-02-17 MED ORDER — ASPIRIN EC 325 MG PO TBEC
325.0000 mg | DELAYED_RELEASE_TABLET | Freq: Every day | ORAL | Status: DC
  Administered 2014-02-17: 325 mg via ORAL
  Filled 2014-02-17: qty 1

## 2014-02-17 MED ORDER — SODIUM CHLORIDE 0.9 % IJ SOLN
3.0000 mL | Freq: Two times a day (BID) | INTRAMUSCULAR | Status: DC
  Administered 2014-02-17 (×2): 3 mL via INTRAVENOUS

## 2014-02-17 MED ORDER — HYDRALAZINE HCL 50 MG PO TABS
50.0000 mg | ORAL_TABLET | Freq: Three times a day (TID) | ORAL | Status: DC
  Administered 2014-02-17: 50 mg via ORAL
  Filled 2014-02-17: qty 1

## 2014-02-17 MED ORDER — MEGESTROL ACETATE 40 MG PO TABS
40.0000 mg | ORAL_TABLET | Freq: Two times a day (BID) | ORAL | Status: DC
  Administered 2014-02-17: 40 mg via ORAL
  Filled 2014-02-17 (×2): qty 1

## 2014-02-17 MED ORDER — FUROSEMIDE 20 MG PO TABS
20.0000 mg | ORAL_TABLET | Freq: Every day | ORAL | Status: DC
  Administered 2014-02-17: 20 mg via ORAL
  Filled 2014-02-17: qty 1

## 2014-02-17 MED ORDER — LEVETIRACETAM 250 MG PO TABS: 250.0000 mg | ORAL_TABLET | Freq: Two times a day (BID) | ORAL | Status: AC

## 2014-02-17 MED ORDER — MIRTAZAPINE 7.5 MG PO TABS
15.0000 mg | ORAL_TABLET | Freq: Every day | ORAL | Status: DC
  Administered 2014-02-17: 15 mg via ORAL
  Filled 2014-02-17: qty 2

## 2014-02-17 MED ORDER — HYDROCODONE-ACETAMINOPHEN 5-325 MG PO TABS: 2.0000 | ORAL_TABLET | Freq: Four times a day (QID) | ORAL | Status: DC | PRN

## 2014-02-17 MED ORDER — CEFUROXIME AXETIL 500 MG PO TABS
500.0000 mg | ORAL_TABLET | ORAL | Status: DC
  Administered 2014-02-17: 500 mg via ORAL
  Filled 2014-02-17 (×2): qty 1

## 2014-02-17 NOTE — H&P (Signed)
[facility] History and Physical  [name] OAC:[id number] DOB: 12/06/1932 DOA: 02/16/2014  Referring physician: ER physician. PCP: [name],[name], MD   Chief Complaint: Period of unresponsiveness.  History obtained from patient's family.  HPI: Olivia GangHelen L Becvar is a 80 y.o. female with history of seizures, congestive heart failure last year measured this month was 35-40%, hypertension, chronic kidney disease, hyperlipidemia who was recently admitted for respiratory failure secondary to pneumonia and CHF was brought to the ER after patient was found to have brief episode of unresponsiveness while eating supper. Patient as per the family became unresponsive for a few minutes and begin complete awareness following which patient was brought to the ER. Patient did not have incontinence of urine or tongue bite. On-call neurologist was consulted and at this time Dr. Hosie PoissonSumner, on-call neurologist as recommended to give 1 dose of Keppra 250 followed by home dose of Keppra and admitted for observation. In addition patient was found to have elevated troponin with markedly elevated blood pressure. On-call cardiologist has been consulted. EKG shows nonspecific changes. Patient denies any chest pain or shortness of breath.   Review of Systems: As presented in the history of presenting illness, rest negative.  Past Medical History  Diagnosis Date  . Hypertension   . Anxiety and depression   . Chronic headaches   . Diverticular disease   . Anemia   . Arthritis     osteoarthritis  . Osteoporosis   . Vitamin B 12 deficiency   . History of ITP   . Anxiety     takes xanax for sleep  . Diabetes mellitus     NIDDM x 12 years  . Heart murmur     benign;asymptomatic  . Chronic kidney disease     ESRD - AV fistula in LUE (Dr. Carlean Jewsynrthia Dunham = nephrologist)  . Left knee pain 02/08/2013  . Cachexia 02/08/2013  . Hyperlipidemia   . Tremors of nervous system   . Constipated 09/30/2013   Past Surgical  History  Procedure Laterality Date  . Av fistula placement  11/15/10    left upper arm AVF  . Abdominal hysterectomy  1973  . Joint replacement  2007    rt hip  . Eye surgery    . Cataract extraction w/ intraocular lens  implant, bilateral  2002  . Vascular surgery  10-2010    left AVF  . Hernia repair  2000    ventral  . Ventral hernia repair  08/06/2011    Procedure: HERNIA REPAIR VENTRAL ADULT;  Surgeon: Liz MaladyBurke E Thompson, MD;  [facility];  Service: General;  Laterality: N/A;  Open Repair [employer] Hernia with Mesh  . Cholecystectomy  yrs ago  . Total hip arthroplasty Left 04/27/2013    Procedure: LEFT TOTAL HIP ARTHROPLASTY ANTERIOR APPROACH;  Surgeon: Shelda PalMatthew D Olin, MD;  Location: [facility] ORS;  Service: Orthopedics;  Laterality: Left;   Social History:  reports that she has never smoked. Her smokeless tobacco use includes Snuff. She reports that she does not drink alcohol or use illicit drugs. Where does patient live at home. Can patient participate in ADLs? Not sure.  No Known Allergies  Family History:  Family History  Problem Relation Age of Onset  . Stroke Mother   . Heart disease Father       Prior to Admission medications   Medication Sig Start Date End Date Taking? Authorizing Provider  acetaminophen (TYLENOL) 325 MG tablet Take 650 mg by mouth every 6 (six)  hours as needed for headache.   Yes Historical Provider, MD  aspirin 81 MG chewable tablet Chew 1 tablet (81 mg total) by mouth daily. 02/15/14  Yes Ripudeep Jenna Luo, MD  atorvastatin (LIPITOR) 20 MG tablet Take 20 mg by mouth at bedtime. daily 01/08/12  Yes Historical Provider, MD  azithromycin (ZITHROMAX) 500 MG tablet Take 1 tablet (500 mg total) by mouth daily. X 7 days 02/15/14  Yes Ripudeep Jenna Luo, MD  cefUROXime (CEFTIN) 500 MG tablet Take 1 tablet (500 mg total) by mouth 2 (two) times daily with a meal. X 1 week 02/15/14  Yes Ripudeep K Rai, MD  Cholecalciferol (VITAMIN D-3) 1000 UNITS CAPS Take 1,000  Units by mouth daily.   Yes Historical Provider, MD  [employer] VITAMIN B12 1000 MCG tablet TAKE 1 TABLET (1,000 MCG TOTAL) BY MOUTH DAILY. 08/22/13  Yes Artis Delay, MD  docusate sodium (COLACE) 100 MG capsule Take 100 mg by mouth daily as needed for mild constipation.   Yes Historical Provider, MD  furosemide (LASIX) 20 MG tablet Take 1 tablet (20 mg total) by mouth daily. 02/19/14  Yes Ripudeep Jenna Luo, MD  hydrALAZINE (APRESOLINE) 50 MG tablet Take 1 tablet (50 mg total) by mouth 3 (three) times daily. 02/15/14  Yes Ripudeep Jenna Luo, MD  HYDROcodone-acetaminophen (NORCO/VICODIN) 5-325 MG per tablet Take 2 tablets by mouth every 6 (six) hours as needed for moderate pain.   Yes Historical Provider, MD  isosorbide mononitrate (IMDUR) 60 MG 24 hr tablet Take 1 tablet (60 mg total) by mouth daily. 02/15/14  Yes Ripudeep Jenna Luo, MD  levETIRAcetam (KEPPRA) 250 MG tablet TAKE 1 TABLET BY MOUTH TWICE A DAY   Yes Delia Heady, MD  megestrol (MEGACE) 40 MG tablet TAKE 1 TABLET (40 MG TOTAL) BY MOUTH 2 (TWO) TIMES DAILY. 07/22/13  Yes Artis Delay, MD  metoprolol succinate (TOPROL-XL) 50 MG 24 hr tablet Take 1 tablet (50 mg total) by mouth 2 (two) times daily. Take with or immediately following a meal. 02/15/14  Yes Ripudeep K Rai, MD  mirtazapine (REMERON) 15 MG tablet Take 15 mg by mouth daily. 09/20/13  Yes Historical Provider, MD  oxybutynin (DITROPAN-XL) 5 MG 24 hr tablet Take 5 mg by mouth daily.   Yes Historical Provider, MD  senna (SENOKOT) 8.6 MG TABS tablet Take 2 tablets (17.2 mg total) by mouth 2 (two) times daily. 09/30/13  Yes Artis Delay, MD  sodium polystyrene (KAYEXALATE) 15 GM/60ML suspension Take 15 g by mouth 3 (three) times a week. Monday, Wednesday and Friday   Yes Historical Provider, MD  tiZANidine (ZANAFLEX) 4 MG tablet Take 1 tablet (4 mg total) by mouth every 6 (six) hours as needed for muscle spasms. 04/29/13  Yes Genelle Gather Babish, PA-C    Physical Exam: Filed Vitals:   02/16/14 2345 02/17/14  0000 02/17/14 0015 02/17/14 0045  BP: 168/59 157/62 153/63 180/53  Pulse: 58 60 59 59  Temp:    97.5 F (36.4 C)  TempSrc:    Oral  Resp: 15 11 17 18   Height:    5\' 3"  (1.6 m)  Weight:    49.306 kg (108 lb 11.2 oz)  SpO2: 97% 98% 98% 98%     General:  Poorly built and nourished.  Eyes: Anicteric no pallor.  ENT: No discharge from the ears eyes nose or mouth.  Neck: No mass felt. No JVD appreciated.  Cardiovascular: S1 and S2 heard.  Respiratory: No rhonchi or crepitations.  Abdomen: Soft nontender  bowel sounds present.  Skin: Chronic skin wounds on the lower extremities.  Musculoskeletal: No edema.  Psychiatric: Appears normal.  Neurologic: Alert awake oriented to time place and person. Moves all extremities 5 x 5. Patient has tremors. No facial asymmetry. Tongue was midline. PERRLA positive.  Labs on Admission:  Basic Metabolic Panel:  Recent Labs Lab 02/12/14 0851 02/13/14 0336 02/14/14 1406 02/15/14 0426 02/16/14 2102 02/16/14 2112  NA 142 138 139 138 139 139  K 4.1 3.9 3.9 4.2 4.8 4.7  CL 102 98 98 101 104 101  CO2 26 30 30 29 28   --   GLUCOSE 120* 98 165* 99 120* 120*  BUN 23 26* 35* 34* 30* 31*  CREATININE 1.58* 2.12* 2.18* 2.23* 2.15* 2.00*  CALCIUM 8.9 8.5 8.4 8.3* 8.8  --    Liver Function Tests:  Recent Labs Lab 02/16/14 2102  AST 29  ALT 33  ALKPHOS 65  BILITOT 0.6  PROT 5.5*  ALBUMIN 3.2*   No results for input(s): LIPASE, AMYLASE in the last 168 hours. No results for input(s): AMMONIA in the last 168 hours. CBC:  Recent Labs Lab 02/12/14 0851 02/13/14 0336 02/15/14 0426 02/16/14 2102 02/16/14 2112  WBC 8.5 4.6 5.4 6.2  --   NEUTROABS 6.7  --   --  2.7  --   HGB 10.4* 9.4* 9.7* 10.1* 11.2*  HCT 33.9* 29.4* 30.4* 32.5* 33.0*  MCV 94.7 96.7 94.7 94.2  --   PLT 148* 98* 103* 130*  --    Cardiac Enzymes:  Recent Labs Lab 02/16/14 2122  TROPONINI 0.24*    BNP (last 3 results)  Recent Labs  04/30/13 1904  02/12/14 0851  PROBNP 10767.0* 14197.0*   CBG:  Recent Labs Lab 02/14/14 1633 02/14/14 2050 02/15/14 0618 02/15/14 1118 02/16/14 2059  GLUCAP 110* 132* 115* 139* 112*    Radiological Exams on Admission: Ct Head Wo Contrast  02/16/2014   CLINICAL DATA:  Code stroke, found unresponsive, left-sided weakness  EXAM: CT HEAD WITHOUT CONTRAST  TECHNIQUE: Contiguous axial images were obtained from the base of the skull through the vertex without intravenous contrast.  COMPARISON:  03/04/2013  FINDINGS: Mild cortical volume loss noted with proportional ventricular prominence. Areas of periventricular white matter hypodensity are most compatible with small vessel ischemic change. No acute hemorrhage, infarct, or mass lesion is identified. No midline shift. Orbits and paranasal sinuses are unremarkable. Prominence of the skull diploic space is identified which may be associated with long-term use of anti seizure medication.  IMPRESSION: No acute intracranial finding.  These results were called by telephone at the time of interpretation on 02/16/2014 at 9:18 pm to Dr. Elspeth Cho, who verbally acknowledged these results.   Electronically Signed   By: Christiana Pellant M.D.   On: 02/16/2014 21:19    EKG: Independently reviewed. Normal sinus rhythm with nonspecific ST-T changes in the V2.  Assessment/Plan Principal Problem:   Seizure Active Problems:   Accelerated hypertension   Elevated troponin   CKD (chronic kidney disease) stage 3, GFR 30-59 ml/min   1. Seizure - at this time neurologist feel that patient's unresponsiveness. It could be from seizure. Patient was given 1 extra dose of Keppra and continue with home dose as IV. Closely observe. 2. Accelerated hypertension - given recent admission patient's blood pressure medications were readjusted. At this time patient's blood pressure improved 1 dose of IV hydralazine and I have placed patient on when necessary IV hydralazine and continue home  dose of  antihypertensives. Closely follow blood pressure trends. 3. Elevated troponin - patient denies any chest pain. I have discussed with on-call cardiologist Dr. Adolm JosephWhitlock will be seeing patient in consult. We will cycle cardiac markers. Continue antiplatelet agents. 4. Congestive heart failure last EF measured was 35-40% this month - patient's creatinine had worsened when Lasix was increased during last week. Closely follow intake and output and metabolic panel and daily weights. 5. Chronic kidney disease stage III - all metabolic panel. 6. Chronic lower extremity wounds. 7. Recently admitted for pneumonia - continue remaining dose of antibiotics.   DVT Prophylaxis Lovenox.  Code Status: Full code.  Family Communication: Patient's family at the bedside.  Disposition Plan: Admit for observation.    Malika Demario N. [facility] Pager [phone].  If 7PM-7AM, please contact night-coverage [email or web address] Password [email or web address] 02/17/2014, 2:38 AM

## 2014-02-17 NOTE — Evaluation (Signed)
Physical Therapy Evaluation and Discharge Patient Details Name: Dante GangHelen L Markham MRN: [id number] DOB: 12/01/1932 Today's Date: 02/17/2014   History of Present Illness  Pt adm with acute respiratory failure secondary to community-acquired pneumonia and possible acute on chronic diastolic CHF. PMH - THA, HTN, DM, anxiety    Clinical Impression  Pt's family stated that pt is moving at baseline status and does not want pt to receive HHPT. PT talked to family about the benefit of HHPT and they stated that they feel she does not need it. Pt has all DME that is necessary for going home and has 24 care provided by family members. Pt's family was educated on how to use a gait belt and was able to practice using the gait belt with the pt ambulating in room. Pt's family stated they would go down to gift store at end of session to purchase one for use at home. Since pt is at baseline and has necessary level of assist and DME, pt does not require further acute PT. No goals will be written at this time.     Follow Up Recommendations Supervision/Assistance - 24 hour; Recommended HHPT and family stated they do not feel like pt needs it.     Equipment Recommendations  None recommended by PT    Recommendations for Other Services       Precautions / Restrictions Precautions Precautions: Fall Restrictions Weight Bearing Restrictions: No      Mobility  Bed Mobility  Pt sitting in chair at start of session; Did not perform                Transfers Overall transfer level: Needs assistance Equipment used: Rolling walker (2 wheeled) Transfers: Sit to/from Stand Sit to Stand: Mod assist         General transfer comment: Pt requiring mod assist to achieve standing from recliner. Pt's daughter demonstrated technique used at home with pt holding daughter's hand and holding onto walker with other hand. Pt able to achieve standing on first try but had posterior lean requiring verbal and tactile cuing to  bring feet underneath her COM.   Ambulation/Gait Ambulation/Gait assistance: Min assist Ambulation Distance (Feet): 3 Feet Assistive device: Rolling walker (2 wheeled) Gait Pattern/deviations: Shuffle   Gait velocity interpretation: Below normal speed for age/gender General Gait Details: Pt requiring assistance to maintain standing and for safety as patient was unsteady and anxious. Pt fatigued very quickly and began tremoring which family stated is normal and baseline for pt. Pt's family practice using gait belt with pt while up and moving. Pt's family going to go purchase gait belt after session.   Stairs            Wheelchair Mobility    Modified Rankin (Stroke Patients Only)       Balance Overall balance assessment: Needs assistance Sitting-balance support: Bilateral upper extremity supported;Feet supported Sitting balance-Leahy Scale: Poor Sitting balance - Comments: Pt able to sit forward when cued but unable to hold for more than a few seconds before leaning posteriorly Postural control: Posterior lean Standing balance support: During functional activity;Bilateral upper extremity supported Standing balance-Leahy Scale: Poor Standing balance comment: Pt requires assistance and support of walker to maintain balance and safety.                              Pertinent Vitals/Pain Pain Assessment: No/denies pain    Home Living Family/patient expects to be discharged to::  Private residence Living Arrangements: Children Available Help at Discharge: Family;Available 24 hours/day Type of Home: House Home Access: Ramped entrance     Home Layout: One level Home Equipment: Walker - 2 wheels;Wheelchair - [employer] Corporationmanual;Bedside commode;Tub bench      Prior Function Level of Independence: Needs assistance   Gait / Transfers Assistance Needed: Pt able to ambulate short distances with RW and assistance at home. Mostly uses wheelchair to get around.             Hand Dominance        Extremity/Trunk Assessment                         Communication   Communication: No difficulties  Cognition Arousal/Alertness: Awake/alert Behavior During Therapy: Anxious Overall Cognitive Status: History of cognitive impairments - at baseline                      General Comments      Exercises        Assessment/Plan    PT Assessment Patient needs continued PT services  PT Diagnosis Difficulty walking;Generalized weakness   PT Problem List Decreased strength;Decreased activity tolerance;Decreased balance;Decreased mobility;Decreased knowledge of use of DME  PT Treatment Interventions     PT Goals (Current goals can be found in the Care Plan section) Acute Rehab PT Goals Patient Stated Goal: Return home    Frequency     Barriers to discharge        Co-evaluation               End of Session Equipment Utilized During Treatment: Gait belt Activity Tolerance: Patient tolerated treatment well Patient left: in chair;with call bell/phone within reach;with family/visitor present Nurse Communication:  (Discharge needs)         Time: 1217-1229 PT Time Calculation (min) (ACUTE ONLY): 12 min   Charges:   PT Evaluation $Initial PT Evaluation Tier I: 1 Procedure     PT G CodesYork Spaniel:        Hebert, Kreed Kauffman SPT 02/17/2014, 12:51 PM  York Spaniellivia Hebert, SPT  Acute Rehabilitation [phone]

## 2014-02-17 NOTE — Progress Notes (Signed)
UR completed 

## 2014-02-17 NOTE — Consult Note (Signed)
Patient Name: [name] Date of Encounter: 02/17/2014  Principal Problem:   Seizure Active Problems:   Accelerated hypertension   Elevated troponin   CKD (chronic kidney disease) stage 3, GFR 30-59 ml/min   Length of Stay: 1  SUBJECTIVE  The patient denies chest pain or SOB, she wishes to go home.  CURRENT MEDS . aspirin EC  325 mg Oral Daily  . atorvastatin  20 mg Oral QHS  . azithromycin  500 mg Oral Daily  . cefUROXime  500 mg Oral Q24H  . cholecalciferol  1,000 Units Oral Daily  . enoxaparin (LOVENOX) injection  30 mg Subcutaneous Q24H  . furosemide  20 mg Oral Daily  . hydrALAZINE  50 mg Oral TID  . isosorbide mononitrate  60 mg Oral Daily  . levETIRAcetam  250 mg Intravenous Q12H  . megestrol  40 mg Oral BID  . metoprolol succinate  50 mg Oral BID  . mirtazapine  15 mg Oral Daily  . oxybutynin  5 mg Oral Daily  . senna  2 tablet Oral BID  . sodium chloride  3 mL Intravenous Q12H  . sodium chloride  3 mL Intravenous Q12H  . [START ON 02/18/2014] sodium polystyrene  15 g Oral Once per day on Mon Wed Fri   OBJECTIVE  Filed Vitals:   02/17/14 0000 02/17/14 0015 02/17/14 0045 02/17/14 0500  BP: 157/62 153/63 180/53 148/47  Pulse: 60 59 59 52  Temp:   97.5 F (36.4 C) 97.8 F (36.6 C)  TempSrc:   Oral Oral  Resp: 11 17 18 18   Height:   5\' 3"  (1.6 m)   Weight:   108 lb 11.2 oz (49.306 kg)   SpO2: 98% 98% 98% 95%   No intake or output data in the 24 hours ending 02/17/14 1003 Filed Weights   02/17/14 0045  Weight: 108 lb 11.2 oz (49.306 kg)   PHYSICAL EXAM  General: Pleasant, NAD. Neuro: Alert and oriented X 3. Moves all extremities spontaneously. Weakness on the left side Psych: Normal affect. HEENT:  Normal  Neck: Supple without bruits or JVD. Lungs:  Resp regular and unlabored, CTA. Heart: RRR no s3, s4, 3/6 systolic murmur. Abdomen: Soft, non-tender, non-distended, BS + x 4.  Extremities: No clubbing, cyanosis or edema. DP/PT/Radials 2+ and  equal bilaterally.  Accessory Clinical Findings  CBC  Recent Labs  02/16/14 2102 02/16/14 2112 02/17/14 0825  WBC 6.2  --  4.0  NEUTROABS 2.7  --  1.6*  HGB 10.1* 11.2* 9.4*  HCT 32.5* 33.0* 30.1*  MCV 94.2  --  95.0  PLT 130*  --  PENDING   Basic Metabolic Panel  Recent Labs  02/16/14 2102 02/16/14 2112 02/17/14 0825  NA 139 139 137  K 4.8 4.7 4.5  CL 104 101 103  CO2 28  --  27  GLUCOSE 120* 120* 88  BUN 30* 31* 29*  CREATININE 2.15* 2.00* 2.08*  CALCIUM 8.8  --  8.4   Liver Function Tests  Recent Labs  02/16/14 2102 02/17/14 0825  AST 29 23  ALT 33 27  ALKPHOS 65 56  BILITOT 0.6 0.3  PROT 5.5* 4.8*  ALBUMIN 3.2* 2.8*   No results for input(s): LIPASE, AMYLASE in the last 72 hours. Cardiac Enzymes  Recent Labs  02/16/14 2122 02/17/14 0327 02/17/14 0825  TROPONINI 0.24* 0.18* 0.14*   Radiology/Studies  Ct Head Wo Contrast  02/16/2014   CLINICAL DATA:  Code stroke, found unresponsive, left-sided  weakness    IMPRESSION: No acute intracranial finding.  These results were called by telephone at the time of interpretation on 02/16/2014 at 9:18 pm to Dr. Elspeth ChoPeter Sumner, who verbally acknowledged these results.    Dg Chest Port 1 View  02/12/2014   CLINICAL DATA:  One day history of productive cough   IMPRESSION: Evidence of congestive heart failure. Question alveolar edema versus pneumonia in the lower lobes. Both entities may exist concurrently. Small left effusion present.     TELE: SR  ECG  Echo: 02/13/2014  - Left ventricle: The cavity size was normal. Systolic function was moderately reduced. The estimated ejection fraction was in the range of 35% to 40%. There is moderate hypokinesis of the entireinferior myocardium. There is mild hypokinesis of the entireanteroseptal myocardium. Doppler parameters are consistent with abnormal left ventricular relaxation (grade 1 diastolic dysfunction). - Mitral valve: Calcified annulus. There  was moderate regurgitation. - Left atrium: The atrium was moderately dilated. - Right ventricle: Systolic function was mildly reduced. - Right atrium: The atrium was mildly dilated. - Tricuspid valve: There was moderate regurgitation. - Pulmonary arteries: Systolic pressure was mildly to moderately increased. PA peak pressure: 38 mm Hg (S). - Pericardium, extracardiac: There was a left pleural effusion and consolidation of left lower lung.  Echo: 2013 Left ventricle: The cavity size was normal. There was mild concentric hypertrophy. Systolic function was normal. The estimated ejection fraction was in the range of 55% to 60%. Wall motion was normal; there were no regional wall motion abnormalities. Doppler parameters are consistent with abnormal left ventricular relaxation (grade 1 diastolic dysfunction). The E/e' ratio is >10, suggesting elevated LV filling pressure. - Pulmonary arteries: PA peak pressure: 62mm Hg (S).   ASSESSMENT AND PLAN  80 y.o. female w/ PMHx significant for HTN, DM2, MR and TR, CKD, ?seizure history who [facility] as a code stroke after having an unresponsive episode with aphasia at the dinner table, presenting with systolic BP in 200s, elevated troponin and biphasic T wave changes in V2.   1. Elevated troponin and new T wave changes in anterolateral leads, Troponin is now downtrending 0.24--> 0.18--> 0.14 the patient is asymptomatic, both - troponin elevation and ECG changes are commonly found in patients with stroke. However the patient has also new diagnosis of moderate LV dysfunction, LVEF 35-40% with regional wall motion abnormalities. In the acute CVA she is not a cath candidate, also her Crea (GFR 25) would preclude her from having a cath. Continue ASA, statin, metoprolol.   2. Cardiomyopathy - LVEF 35-40%, based on echo it appears ischemic, I agree with hydralazine/imdur combination for CHF  management as she is CKD stage 4.   3. Hypertension - increase imdur to 90 mg po daily  She can be discharged from our standpoint.  Signed, Lars MassonNELSON, Briasia Flinders H MD, [facility] CampusFACC 02/17/2014

## 2014-02-17 NOTE — Progress Notes (Signed)
Subjective: NO furtehr sz  Exam: Filed Vitals:   02/17/14 1016  BP: 166/66  Pulse: 59  Temp:   Resp:    Gen: In bed, NAD MS: awake, alert, interactive and appropriate.  CN: PERRL, EOMI Motor: moves UE well.    Impression: 80 yo F with likely breakthrough sz in the setting of infection, likely lower sz threshold from infection. Would favor temporary increase in keppra as she recovers from infection.   Recommendations: 1) Keppra 500mg  BID x 3 days, then back to 250mg  BID.  2) Neurology to sign off please call with any further questions or concerns.   Ritta SlotMcNeill Math Brazie, MD [facility] [phone]  If 7pm- 7am, please page neurology on call as listed in AMION.

## 2014-02-17 NOTE — Consult Note (Signed)
CARDIOLOGY CONSULT NOTE  Patient ID: [name], MRN: [id number], DOB/AGE: August 22, 1932 80 y.o. Admit date: 02/16/2014 Date of Consult: 02/17/2014  Primary Physician: Delorse Lek, MD Primary Cardiologist: Jens Som  Chief Complaint: weakness, aphasia Reason for Consultation: EKG changes, elevated troponin  HPI: 80 y.o. female w/ PMHx significant for HTN, DM2, MR and TR, CKD, ?seizure history  who presented to [facility] on 02/17/2014 as a code stroke after having an unresponsive episode with aphasia at the dinner table. Pt is a rather poor historian and does not recall details of the event. She was recently hospitalized and discharged yesterday after hospitalization for acute on chronic diastolic heart failure and pneumonia. She denies any recent history of chest pain, shortness of breath, nausea, or diaphoresis. She reports her edema is improved and denies PND or orthopnea.   In the ER, she was emergently evaluated by neurology who felt this was unlikely a stroke and more consistent with seizure activitly. At presentation, her BP was 201/70s. Head CT negative for stroke.  Initial EKG was sinus with nonspec ST abnormalities, LVH. Followup EKGs demonstrated sinus with a change in T wave morphology, best seen in V2 with biphasic T wave. Throughout all this, she reports she is without complaints of CP, SOB.  Prior ischemia evaluation in 03/2013 demonstrated low risk scan with possible inferior scar and peri-infarct ischemia vs. Attenuation. Echo 02/13/2014 demonstrated reduced EF at 35 to 40% with inferior hypokinesis, mod MR, mod TR.  Past Medical History  Diagnosis Date  . Hypertension   . Anxiety and depression   . Chronic headaches   . Diverticular disease   . Anemia   . Arthritis     osteoarthritis  . Osteoporosis   . Vitamin B 12 deficiency   . History of ITP   . Anxiety     takes xanax for sleep  . Diabetes mellitus     NIDDM x 12 years  . Heart murmur      benign;asymptomatic  . Chronic kidney disease     ESRD - AV fistula in LUE (Dr. Carlean Jews = nephrologist)  . Left knee pain 02/08/2013  . Cachexia 02/08/2013  . Hyperlipidemia   . Tremors of nervous system   . Constipated 09/30/2013      Surgical History:  Past Surgical History  Procedure Laterality Date  . Av fistula placement  11/15/10    left upper arm AVF  . Abdominal hysterectomy  1973  . Joint replacement  2007    rt hip  . Eye surgery    . Cataract extraction w/ intraocular lens  implant, bilateral  2002  . Vascular surgery  10-2010    left AVF  . Hernia repair  2000    ventral  . Ventral hernia repair  08/06/2011    Procedure: HERNIA REPAIR VENTRAL ADULT;  Surgeon: Liz Malady, MD;  Location: [facility] OR;  Service: General;  Laterality: N/A;  Open Repair REcurrent Inc. Hernia with Mesh  . Cholecystectomy  yrs ago  . Total hip arthroplasty Left 04/27/2013    Procedure: LEFT TOTAL HIP ARTHROPLASTY ANTERIOR APPROACH;  Surgeon: Shelda Pal, MD;  Location: [facility] ORS;  Service: Orthopedics;  Laterality: Left;     Home Meds: Prior to Admission medications   Medication Sig Start Date End Date Taking? Authorizing Provider  acetaminophen (TYLENOL) 325 MG tablet Take 650 mg by mouth every 6 (six) hours as needed for headache.   Yes Historical Provider, MD  aspirin 81 MG chewable  tablet Chew 1 tablet (81 mg total) by mouth daily. 02/15/14  Yes Ripudeep Jenna Luo, MD  atorvastatin (LIPITOR) 20 MG tablet Take 20 mg by mouth at bedtime. daily 01/08/12  Yes Historical Provider, MD  azithromycin (ZITHROMAX) 500 MG tablet Take 1 tablet (500 mg total) by mouth daily. X 7 days 02/15/14  Yes Ripudeep Jenna Luo, MD  cefUROXime (CEFTIN) 500 MG tablet Take 1 tablet (500 mg total) by mouth 2 (two) times daily with a meal. X 1 week 02/15/14  Yes Ripudeep K Rai, MD  Cholecalciferol (VITAMIN D-3) 1000 UNITS CAPS Take 1,000 Units by mouth daily.   Yes Historical Provider, MD  [employer] VITAMIN B12 1000 MCG  tablet TAKE 1 TABLET (1,000 MCG TOTAL) BY MOUTH DAILY. 08/22/13  Yes Artis Delay, MD  docusate sodium (COLACE) 100 MG capsule Take 100 mg by mouth daily as needed for mild constipation.   Yes Historical Provider, MD  furosemide (LASIX) 20 MG tablet Take 1 tablet (20 mg total) by mouth daily. 02/19/14  Yes Ripudeep Jenna Luo, MD  hydrALAZINE (APRESOLINE) 50 MG tablet Take 1 tablet (50 mg total) by mouth 3 (three) times daily. 02/15/14  Yes Ripudeep Jenna Luo, MD  HYDROcodone-acetaminophen (NORCO/VICODIN) 5-325 MG per tablet Take 2 tablets by mouth every 6 (six) hours as needed for moderate pain.   Yes Historical Provider, MD  isosorbide mononitrate (IMDUR) 60 MG 24 hr tablet Take 1 tablet (60 mg total) by mouth daily. 02/15/14  Yes Ripudeep Jenna Luo, MD  levETIRAcetam (KEPPRA) 250 MG tablet TAKE 1 TABLET BY MOUTH TWICE A DAY   Yes Delia Heady, MD  megestrol (MEGACE) 40 MG tablet TAKE 1 TABLET (40 MG TOTAL) BY MOUTH 2 (TWO) TIMES DAILY. 07/22/13  Yes Artis Delay, MD  metoprolol succinate (TOPROL-XL) 50 MG 24 hr tablet Take 1 tablet (50 mg total) by mouth 2 (two) times daily. Take with or immediately following a meal. 02/15/14  Yes Ripudeep K Rai, MD  mirtazapine (REMERON) 15 MG tablet Take 15 mg by mouth daily. 09/20/13  Yes Historical Provider, MD  oxybutynin (DITROPAN-XL) 5 MG 24 hr tablet Take 5 mg by mouth daily.   Yes Historical Provider, MD  senna (SENOKOT) 8.6 MG TABS tablet Take 2 tablets (17.2 mg total) by mouth 2 (two) times daily. 09/30/13  Yes Artis Delay, MD  sodium polystyrene (KAYEXALATE) 15 GM/60ML suspension Take 15 g by mouth 3 (three) times a week. Monday, Wednesday and Friday   Yes Historical Provider, MD  tiZANidine (ZANAFLEX) 4 MG tablet Take 1 tablet (4 mg total) by mouth every 6 (six) hours as needed for muscle spasms. 04/29/13  Yes Genelle Gather Babish, PA-C    Inpatient Medications:  . levETIRAcetam  250 mg Intravenous Q12H      Allergies: No Known Allergies  History   Social History  .  Marital Status: Widowed    Spouse Name: N/A    Number of Children: 5  . Years of Education: N/A   Occupational History  . Not on file.   Social History Main Topics  . Smoking status: Never Smoker   . Smokeless tobacco: Current User    Types: Snuff  . Alcohol Use: No  . Drug Use: No  . Sexual Activity: No   Other Topics Concern  . Not on file   Social History Narrative     Family History  Problem Relation Age of Onset  . Stroke Mother   . Heart disease Father  Review of Systems: General: negative for chills, fever, night sweats or weight changes.  Cardiovascular: per HPI Dermatological: negative for rash Respiratory: negative for cough or wheezing Urologic: negative for hematuria Abdominal: negative for nausea, vomiting, diarrhea, bright red blood per rectum, melena, or hematemesis Neurologic: negative for visual changes, syncope, or dizziness. + brief aphasia and weakness now resolved. All other systems reviewed and are otherwise negative except as noted above.  Labs:  Recent Labs  02/16/14 2122  TROPONINI 0.24*   Lab Results  Component Value Date   WBC 6.2 02/16/2014   HGB 11.2* 02/16/2014   HCT 33.0* 02/16/2014   MCV 94.2 02/16/2014   PLT 130* 02/16/2014    Recent Labs Lab 02/16/14 2102 02/16/14 2112  NA 139 139  K 4.8 4.7  CL 104 101  CO2 28  --   BUN 30* 31*  CREATININE 2.15* 2.00*  CALCIUM 8.8  --   PROT 5.5*  --   BILITOT 0.6  --   ALKPHOS 65  --   ALT 33  --   AST 29  --   GLUCOSE 120* 120*   No results found for: DDIMER  Radiology/Studies:  Ct Head Wo Contrast  02/16/2014   CLINICAL DATA:  Code stroke, found unresponsive, left-sided weakness  EXAM: CT HEAD WITHOUT CONTRAST  TECHNIQUE: Contiguous axial images were obtained from the base of the skull through the vertex without intravenous contrast.  COMPARISON:  03/04/2013  FINDINGS: Mild cortical volume loss noted with proportional ventricular prominence. Areas of periventricular  white matter hypodensity are most compatible with small vessel ischemic change. No acute hemorrhage, infarct, or mass lesion is identified. No midline shift. Orbits and paranasal sinuses are unremarkable. Prominence of the skull diploic space is identified which may be associated with long-term use of anti seizure medication.  IMPRESSION: No acute intracranial finding.  These results were called by telephone at the time of interpretation on 02/16/2014 at 9:18 pm to Dr. Elspeth ChoPeter Sumner, who verbally acknowledged these results.   Electronically Signed   By: Christiana PellantGretchen  Green M.D.   On: 02/16/2014 21:19   Dg Chest Port 1 View  02/12/2014   CLINICAL DATA:  One day history of productive cough  EXAM: PORTABLE CHEST - 1 VIEW  COMPARISON:  April 30, 2013  FINDINGS: There is cardiomegaly with pulmonary venous hypertension. There is generalized interstitial edema. There is consolidation in both lung bases with small left effusion. No adenopathy. No bone lesions.  IMPRESSION: Evidence of congestive heart failure. Question alveolar edema versus pneumonia in the lower lobes. Both entities may exist concurrently. Small left effusion present.   Electronically Signed   By: Bretta BangWilliam  Woodruff M.D.   On: 02/12/2014 09:20    EKG: see HPI  Physical Exam: Blood pressure 180/53, pulse 59, temperature 97.5 F (36.4 C), temperature source Oral, resp. rate 18, height 5\' 3"  (1.6 m), weight 49.306 kg (108 lb 11.2 oz), SpO2 98 %. General: thin frail appearing female Head: Normocephalic, atraumatic, sclera non-icteric, no xanthomas, nares are without discharge.  Neck: Supple. Negative for carotid bruits. JVD not elevated. Lungs: decreased/absent BS at left base Heart: RRR with S1 S2. 3/6 systolic murmur RLSB Abdomen: Soft, non-tender, non-distended with normoactive bowel sounds. No hepatomegaly. No rebound/guarding. No obvious abdominal masses. Msk:  Strength and tone appear normal for age. Extremities: No clubbing or cyanosis. No  edema. Multiple bandages on legs Neuro: Alert and oriented X 2 (failed to get year) Psych:  Responds to questions appropriately with a normal  affect.   Problem List 1. Elevated troponin, dynamic EKG changes in V2 2. HTN, urgency/emergency 3. Systolic heart failure, EF 35-40%, with RWMA, currently appears euvolemic 4. CKD 5. Mod MR and TR by echo 6. ?seizure  Assessment and Plan:  80 y.o. female w/ PMHx significant for HTN, DM2, MR and TR, CKD, ?seizure history  who [facility] as a code stroke after having an unresponsive episode with aphasia at the dinner table, presenting with systolic BP in 200s, elevated troponin and biphasic T wave changes in V2.   The cause of her elevated troponin and her profound T wave changes in V2 could be due to neuro-cardiogenic cause (classically, this is usually more diffuse deep T wave changes often associated with stroke) though possible in seizure as well. It could also represent demand ischemia in the setting of significant hypertension (hypertensive emergency) and she may have fixed stenosis/areas at risk of ischemia given her borderline abnormal MPI (nontransmural scar, peri-infarct changes) as well as the echo with reduced EF and regional wall motion abnormalities. Ideally, it would be helpful to know her coronary anatomy but risk of contrast induced nephropathy is exceedingly high at this point.  At this time, would continue medical management of her presumed cardiac disease with BP control (hydralazine, nitrates, beta blocker), aspirin and statin. Hold on anticoagulation for now but would heparinize for significantly elevated followup troponin. Cycle troponins.   She appears to be euvolemic currently. Heart failure regimen of "bidil", BB, and lasix.  Summary of Recs:  -BP control as per above  -continue ASA, statin  -cycle troponins  Thank you for this consult. Will follow along. Please call with  question.  Signed, Adolm JosephWHITLOCK, Damara Klunder C. MD 02/17/2014, 1:59 AM

## 2014-02-17 NOTE — ED Notes (Signed)
MD at bedside. 

## 2014-02-17 NOTE — Progress Notes (Signed)
NCM spoke with daughter, Neale BurlySharon Crisco, she [facility], [facility], [facility].  Patient is for dc today. Soc will begin 24-48 hrs post dc.

## 2014-02-17 NOTE — Discharge Instructions (Signed)
Follow with Primary MD BURNETT,[name], MD in 7 days   Get CBC, CMP, 2 view Chest X ray checked  by Primary MD next visit.  Please increase your Keppra for the next 3 days, take 500 mg oral 2 times daily for 3 days then go back to regular dose at 250 mg oral 2 times daily   Activity: As tolerated with Full fall precautions use walker/cane & assistance as needed   Disposition Home    Diet: Heart Healthy ** , with feeding assistance and aspiration precautions as needed.  For Heart failure patients - Check your Weight same time everyday, if you gain over 2 pounds, or you develop in leg swelling, experience more shortness of breath or chest pain, call your Primary MD immediately. Follow Cardiac Low Salt Diet and 1.8 lit/day fluid restriction.   On your next visit with your primary care physician please Get Medicines reviewed and adjusted.   Please request your Prim.MD to go over all Hospital Tests and Procedure/Radiological results at the follow up, please get all Hospital records sent to your Prim MD by signing hospital release before you go home.   If you experience worsening of your admission symptoms, develop shortness of breath, life threatening emergency, suicidal or homicidal thoughts you must seek medical attention immediately by calling 911 or calling your MD immediately  if symptoms less severe.  You Must read complete instructions/literature along with all the possible adverse reactions/side effects for all the Medicines you take and that have been prescribed to you. Take any new Medicines after you have completely understood and accpet all the possible adverse reactions/side effects.   Do not drive, operating heavy machinery, perform activities at heights, swimming or participation in water activities or provide baby sitting services if your were admitted for syncope or siezures until you have seen by Primary MD or a Neurologist and advised to do so again.  Do not drive when  taking Pain medications.    Do not take more than prescribed Pain, Sleep and Anxiety Medications  Special Instructions: If you have smoked or chewed Tobacco  in the last 2 yrs please stop smoking, stop any regular Alcohol  and or any Recreational drug use.  Wear Seat belts while driving.   Please note  You were cared for by a hospitalist during your hospital stay. If you have any questions about your discharge medications or the care you received while you were in the hospital after you are discharged, you can call the unit and asked to speak with the hospitalist on call if the hospitalist that took care of you is not available. Once you are discharged, your primary care physician will handle any further medical issues. Please note that NO REFILLS for any discharge medications will be authorized once you are discharged, as it is imperative that you return to your primary care physician (or establish a relationship with a primary care physician if you do not have one) for your aftercare needs so that they can reassess your need for medications and monitor your lab values.

## 2014-02-17 NOTE — Discharge Summary (Signed)
[name], 80 y.o., DOB 03-Apr-1932, MRN [id number]. Admission date: 02/16/2014 Discharge Date 02/17/2014 Primary MD Delorse Lek, MD Admitting Physician Eduard Clos, MD  Admission Diagnosis  Seizure [R56.9] Abnormal EKG [R94.31] Non-STEMI (non-ST elevated myocardial infarction) [I21.4]  Discharge Diagnosis   Principal Problem:   Seizure Active Problems:   Accelerated hypertension   Elevated troponin   CKD (chronic kidney disease) stage 3, GFR 30-59 ml/min      Past Medical History  Diagnosis Date  . Hypertension   . Anxiety and depression   . Chronic headaches   . Diverticular disease   . Anemia   . Arthritis     osteoarthritis  . Osteoporosis   . Vitamin B 12 deficiency   . History of ITP   . Anxiety     takes xanax for sleep  . Diabetes mellitus     NIDDM x 12 years  . Heart murmur     benign;asymptomatic  . Chronic kidney disease     ESRD - AV fistula in LUE (Dr. Carlean Jews = nephrologist)  . Left knee pain 02/08/2013  . Cachexia 02/08/2013  . Hyperlipidemia   . Tremors of nervous system   . Constipated 09/30/2013    Past Surgical History  Procedure Laterality Date  . Av fistula placement  11/15/10    left upper arm AVF  . Abdominal hysterectomy  1973  . Joint replacement  2007    rt hip  . Eye surgery    . Cataract extraction w/ intraocular lens  implant, bilateral  2002  . Vascular surgery  10-2010    left AVF  . Hernia repair  2000    ventral  . Ventral hernia repair  08/06/2011    Procedure: HERNIA REPAIR VENTRAL ADULT;  Surgeon: Liz Malady, MD;  Location: [facility] OR;  Service: General;  Laterality: N/A;  Open Repair REcurrent Inc. Hernia with Mesh  . Cholecystectomy  yrs ago  . Total hip arthroplasty Left 04/27/2013    Procedure: LEFT TOTAL HIP ARTHROPLASTY ANTERIOR APPROACH;  Surgeon: Shelda Pal, MD;  Location: [facility] ORS;  Service: Orthopedics;  Laterality: Left;    Admission history of present illness/brief narrative: KEIERRA NUDO is an 80 y.o. female with history of seizures, HLD, CKD, CHF presenting with acute transient episode of unresponsiveness with question of aphasia. Was discharged from [facility] yesterday with diagnosis of CAP, discharged on ceftin and zithromax but had not taken doses yet today. Per family was eating dinner, at 2030 she became unresponsive for a brief period, unable to talk. Came to slowly and per her son she appeared mildly confused after before returning to baseline. No extremity twitching, no facial automatisms noted. Family notes she has had episodes similar to this in the past. They note her seizures typically involving shaking of her extremities. Has been seen by neurology, it was felt secondary to breakthrough seizures and setting of infection as it will likely lower threshold of seizures, recommendation was to increase her Keppra for the next 3 days as she is recovering from her infection. As well patient presents with elevated systolic blood pressure in the 200s, had elevated troponins and biphasic T-wave changes in V2, she has been seen by cardiology, her troponins has been trending down 0.24--> 0.18--> 0.14 , patient was felt not a cardiac cath candidate given her creatinine, and questionable CVA diagnosis, so recommendation was made to discharge on aspirin, statin, metoprolol.  Hospital Course See H&P, Labs, Consult and Test reports  for all details in brief, patient was admitted for **  Principal Problem:   Seizure Active Problems:   Accelerated hypertension   Elevated troponin   CKD (chronic kidney disease) stage 3, GFR 30-59 ml/min  Admission history of present illness/brief narrative: Olivia GangHelen L Adkins is an 80 y.o. female with history of seizures, HLD, CKD, CHF presenting with acute transient episode of unresponsiveness with question of aphasia. [facility] with diagnosis of CAP, discharged on ceftin and zithromax but had not taken doses yet today. Per family was eating  dinner, at 2030 she became unresponsive for a brief period, unable to talk. Came to slowly and per her son she appeared mildly confused after before returning to baseline. No extremity twitching, no facial automatisms noted. Family notes she has had episodes similar to this in the past. They note her seizures typically involving shaking of her extremities. Has been seen by neurology, it was felt secondary to breakthrough seizures and setting of infection as it will likely lower threshold of seizures, recommendation was to increase her Keppra for the next 3 days as she is recovering from her infection. As well patient presents with elevated systolic blood pressure in the 200s, had elevated troponins and biphasic T-wave changes in V2, she has been seen by cardiology, her troponins has been trending down 0.24--> 0.18--> 0.14 , patient was felt not a cardiac cath candidate given her creatinine, and questionable CVA diagnosis, so recommendation was made to discharge on aspirin, statin, metoprolol.  Seizures: likely breakthrough sz in the setting of infection, likely lower sz threshold from infection. Would favor temporary increase in keppra as she recovers from infection.  Recommendations: 1) Keppra 500mg  BID x 3 days, then back to 250mg  BID.   Elevated troponin and new T wave changes in anterolateral leads, Troponin is now downtrending 0.24--> 0.18--> 0.14 the patient is asymptomatic, as been evaluated by cardiology, currently no workup to she is more stable.  Cardiomyopathy - LVEF 35-40%, based on echo  - Continue with hydralazine and Imdur, no ACE inhibitor or ARB given her kidney disease.  Chronic kidney disease -At baseline  Diagnosis for pneumonia during previous hospitalization -Continue with Ceftin and azithromycin on discharge.  Consults   Cardiology Neurology Significant Tests:  See full reports for all details    Ct Head Wo Contrast  02/16/2014   CLINICAL DATA:  Code stroke, found  unresponsive, left-sided weakness  EXAM: CT HEAD WITHOUT CONTRAST  TECHNIQUE: Contiguous axial images were obtained from the base of the skull through the vertex without intravenous contrast.  COMPARISON:  03/04/2013  FINDINGS: Mild cortical volume loss noted with proportional ventricular prominence. Areas of periventricular white matter hypodensity are most compatible with small vessel ischemic change. No acute hemorrhage, infarct, or mass lesion is identified. No midline shift. Orbits and paranasal sinuses are unremarkable. Prominence of the skull diploic space is identified which may be associated with long-term use of anti seizure medication.  IMPRESSION: No acute intracranial finding.  These results were called by telephone at the time of interpretation on 02/16/2014 at 9:18 pm to Dr. Elspeth ChoPeter Sumner, who verbally acknowledged these results.   Electronically Signed   By: Christiana PellantGretchen  Green M.D.   On: 02/16/2014 21:19   Dg Chest Port 1 View  02/12/2014   CLINICAL DATA:  One day history of productive cough  EXAM: PORTABLE CHEST - 1 VIEW  COMPARISON:  April 30, 2013  FINDINGS: There is cardiomegaly with pulmonary venous hypertension. There is generalized interstitial  edema. There is consolidation in both lung bases with small left effusion. No adenopathy. No bone lesions.  IMPRESSION: Evidence of congestive heart failure. Question alveolar edema versus pneumonia in the lower lobes. Both entities may exist concurrently. Small left effusion present.   Electronically Signed   By: Bretta Bang M.D.   On: 02/12/2014 09:20     Today   Subjective:   [name] today has no headache,no chest abdominal pain,no new weakness tingling or numbness, feels much better wants to go home today.  Objective:   Blood pressure 166/66, pulse 59, temperature 97.8 F (36.6 C), temperature source Oral, resp. rate 18, height 5\' 3"  (1.6 m), weight 49.306 kg (108 lb 11.2 oz), SpO2 95 %. No intake or output data in the 24 hours  ending 02/17/14 1314  Exam Awake Alert, Oriented , No new F.N deficits, Normal affect [place].AT,PERRAL Supple Neck,No JVD, No cervical lymphadenopathy appriciated.  Symmetrical Chest wall movement, Good air movement bilaterally, CTAB RRR,No Gallops,Rubs or new Murmurs, No Parasternal Heave +ve B.Sounds, Abd Soft, Non tender, No organomegaly appriciated, No rebound -guarding or rigidity. No Cyanosis, Clubbing or edema, No new Rash or bruise  Data Review    CBC w Diff:  Lab Results  Component Value Date   WBC 4.0 02/17/2014   WBC 5.1 01/13/2014   HGB 9.4* 02/17/2014   HGB 8.6* 01/13/2014   HCT 30.1* 02/17/2014   HCT 27.3* 01/13/2014   PLT 108* 02/17/2014   PLT 86* 01/13/2014   LYMPHOPCT 38 02/17/2014   LYMPHOPCT 30.0 01/13/2014   MONOPCT 12 02/17/2014   MONOPCT 8.4 01/13/2014   EOSPCT 8* 02/17/2014   EOSPCT 3.3 01/13/2014   BASOPCT 1 02/17/2014   BASOPCT 0.6 01/13/2014   CMP:  Lab Results  Component Value Date   NA 137 02/17/2014   NA 137 08/10/2013   K 4.5 02/17/2014   K 5.0 08/10/2013   CL 103 02/17/2014   CL 106 07/10/2012   CO2 27 02/17/2014   CO2 20* 08/10/2013   BUN 29* 02/17/2014   BUN 60.3* 08/10/2013   CREATININE 2.08* 02/17/2014   CREATININE 2.5* 08/10/2013   PROT 4.8* 02/17/2014   PROT 5.9* 08/10/2013   ALBUMIN 2.8* 02/17/2014   ALBUMIN 3.2* 08/10/2013   BILITOT 0.3 02/17/2014   BILITOT 0.50 08/10/2013   ALKPHOS 56 02/17/2014   ALKPHOS 54 08/10/2013   AST 23 02/17/2014   AST 11 08/10/2013   ALT 27 02/17/2014   ALT 6 08/10/2013  .  Micro Results Recent Results (from the past 240 hour(s))  Urine culture     Status: None   Collection Time: 02/12/14  9:41 AM  Result Value Ref Range Status   Specimen Description URINE, CATHETERIZED  Final   Special Requests NONE  Final   Culture  Setup Time   Final    02/12/2014 11:31 Performed at [facility] Count   Final    >=100,000 COLONIES/ML Performed at [facility]     Culture   Final    ESCHERICHIA COLI Performed at [facility]    Report Status 02/15/2014 FINAL  Final   Organism ID, Bacteria ESCHERICHIA COLI  Final      Susceptibility   Escherichia coli - MIC*    AMPICILLIN >=32 RESISTANT Resistant     CEFAZOLIN <=4 SENSITIVE Sensitive     CEFTRIAXONE <=1 SENSITIVE Sensitive     CIPROFLOXACIN >=4 RESISTANT Resistant     GENTAMICIN <=1 SENSITIVE Sensitive  LEVOFLOXACIN >=8 RESISTANT Resistant     NITROFURANTOIN <=16 SENSITIVE Sensitive     TOBRAMYCIN <=1 SENSITIVE Sensitive     TRIMETH/SULFA <=20 SENSITIVE Sensitive     PIP/TAZO <=4 SENSITIVE Sensitive     * ESCHERICHIA COLI     Discharge Instructions     Follow-up Information    Follow up with Olga MillersBrian Crenshaw, MD.   Specialty:  Cardiology   Why:  The office will call you to make an appoinment., If you do not hear from them, please contact them., You should be seen within 1-2 weeks. You also need a stress test to be scheduled   Contact information:   [address] [place] [address] [phone]       Follow up with [facility].   Why:  Resume HHRN for wound care   Contact information:   [address] [place] [address] [phone]       Follow up with BURNETT,[name], MD In 1 week.   Specialty:  Family Medicine   Contact information:   [address] [place] [address] [phone]       Discharge Medications     Medication List    STOP taking these medications        azithromycin 500 MG tablet  Commonly known as:  ZITHROMAX      TAKE these medications        acetaminophen 325 MG tablet  Commonly known as:  TYLENOL  Take 650 mg by mouth every 6 (six) hours as needed for headache.     aspirin 81 MG chewable tablet  Chew 1 tablet (81 mg total) by mouth daily.     atorvastatin 20 MG tablet  Commonly known as:  LIPITOR  Take 20 mg by mouth at bedtime. daily     cefUROXime 500 MG tablet   Commonly known as:  CEFTIN  Take 1 tablet (500 mg total) by mouth 2 (two) times daily with a meal. X 1 week     [employer] VITAMIN B12 1000 MCG tablet  Generic drug:  cyanocobalamin  TAKE 1 TABLET (1,000 MCG TOTAL) BY MOUTH DAILY.     docusate sodium 100 MG capsule  Commonly known as:  COLACE  Take 100 mg by mouth daily as needed for mild constipation.     furosemide 20 MG tablet  Commonly known as:  LASIX  Take 1 tablet (20 mg total) by mouth daily.  Start taking on:  02/19/2014     hydrALAZINE 50 MG tablet  Commonly known as:  APRESOLINE  Take 1 tablet (50 mg total) by mouth 3 (three) times daily.     HYDROcodone-acetaminophen 5-325 MG per tablet  Commonly known as:  NORCO/VICODIN  Take 2 tablets by mouth every 6 (six) hours as needed for moderate pain.     isosorbide mononitrate 60 MG 24 hr tablet  Commonly known as:  IMDUR  Take 1 tablet (60 mg total) by mouth daily.     levETIRAcetam 250 MG tablet  Commonly known as:  KEPPRA  Take 1 tablet (250 mg total) by mouth 2 (two) times daily. Please take 2 tablets oral twice daily (500 mg oral twice daily), for the next 3 days ( till 02/19/14) , then resume 1 tablet oral (250 mg) twice daily from 12/27 thereafter.     megestrol 40 MG tablet  Commonly known as:  MEGACE  TAKE 1 TABLET (40 MG TOTAL) BY MOUTH 2 (  TWO) TIMES DAILY.     metoprolol succinate 50 MG 24 hr tablet  Commonly known as:  TOPROL-XL  Take 1 tablet (50 mg total) by mouth 2 (two) times daily. Take with or immediately following a meal.     mirtazapine 15 MG tablet  Commonly known as:  REMERON  Take 15 mg by mouth daily.     oxybutynin 5 MG 24 hr tablet  Commonly known as:  DITROPAN-XL  Take 5 mg by mouth daily.     senna 8.6 MG Tabs tablet  Commonly known as:  SENOKOT  Take 2 tablets (17.2 mg total) by mouth 2 (two) times daily.     sodium polystyrene 15 GM/60ML suspension  Commonly known as:  KAYEXALATE  Take 15 g by mouth 3 (three) times a week. Monday,  Wednesday and Friday     tiZANidine 4 MG tablet  Commonly known as:  ZANAFLEX  Take 1 tablet (4 mg total) by mouth every 6 (six) hours as needed for muscle spasms.     Vitamin D-3 1000 UNITS Caps  Take 1,000 Units by mouth daily.         Total Time in preparing paper work, data evaluation and todays exam - 35 minutes  Tuwanda Vokes M.D on 02/17/2014 at 1:14 PM  [facility] Office  [phone]

## 2014-02-21 NOTE — Progress Notes (Signed)
Late entry for missed g code 02/17/14:   02/21/14 1600  PT G-Codes **NOT FOR INPATIENT CLASS**  Functional Assessment Tool Used clinical judgment  Functional Limitation Mobility: Walking and moving around  Mobility: Walking and Moving Around Current Status [phone](G8978) CK  Mobility: Walking and Moving Around Goal Status [phone](G8979) CK  Mobility: Walking and Moving Around Discharge Status [phone](G8980) CK  Ferdinand Revoir,PT Acute Rehabilitation [phone] (pager)

## 2014-02-22 ENCOUNTER — Telehealth (HOSPITAL_COMMUNITY): Payer: Self-pay

## 2014-02-22 NOTE — Telephone Encounter (Signed)
Encounter complete. 

## 2014-02-23 ENCOUNTER — Ambulatory Visit (HOSPITAL_COMMUNITY)
Admission: RE | Admit: 2014-02-23 | Discharge: 2014-02-23 | Disposition: A | Discharge status: Home / Self Care | Payer: Medicare Other | Source: Ambulatory Visit | Attending: Physician Assistant | Admitting: Physician Assistant

## 2014-02-23 DIAGNOSIS — I255 Ischemic cardiomyopathy: Secondary | ICD-10-CM

## 2014-02-24 ENCOUNTER — Other Ambulatory Visit (HOSPITAL_BASED_OUTPATIENT_CLINIC_OR_DEPARTMENT_OTHER): Payer: Medicare Other

## 2014-02-24 ENCOUNTER — Ambulatory Visit (HOSPITAL_BASED_OUTPATIENT_CLINIC_OR_DEPARTMENT_OTHER): Payer: Medicare Other

## 2014-02-24 DIAGNOSIS — N189 Chronic kidney disease, unspecified: Principal | ICD-10-CM

## 2014-02-24 DIAGNOSIS — D638 Anemia in other chronic diseases classified elsewhere: Secondary | ICD-10-CM

## 2014-02-24 DIAGNOSIS — D519 Vitamin B12 deficiency anemia, unspecified: Secondary | ICD-10-CM

## 2014-02-24 DIAGNOSIS — D631 Anemia in chronic kidney disease: Secondary | ICD-10-CM

## 2014-02-24 LAB — CBC WITH DIFFERENTIAL/PLATELET
BASO%: 0.5 % (ref 0.0–2.0)
Basophils Absolute: 0 10*3/uL (ref 0.0–0.1)
EOS ABS: 0.3 10*3/uL (ref 0.0–0.5)
EOS%: 5.6 % (ref 0.0–7.0)
HCT: 28.9 % — ABNORMAL LOW (ref 34.8–46.6)
HGB: 9 g/dL — ABNORMAL LOW (ref 11.6–15.9)
LYMPH%: 26.2 % (ref 14.0–49.7)
MCH: 30 pg (ref 25.1–34.0)
MCHC: 31.1 g/dL — ABNORMAL LOW (ref 31.5–36.0)
MCV: 96.3 fL (ref 79.5–101.0)
MONO#: 0.6 10*3/uL (ref 0.1–0.9)
MONO%: 10.4 % (ref 0.0–14.0)
NEUT%: 57.3 % (ref 38.4–76.8)
NEUTROS ABS: 3.2 10*3/uL (ref 1.5–6.5)
Platelets: 102 10*3/uL — ABNORMAL LOW (ref 145–400)
RBC: 3 10*6/uL — ABNORMAL LOW (ref 3.70–5.45)
RDW: 14.2 % (ref 11.2–14.5)
WBC: 5.6 10*3/uL (ref 3.9–10.3)
lymph#: 1.5 10*3/uL (ref 0.9–3.3)

## 2014-02-24 MED ORDER — DARBEPOETIN ALFA 300 MCG/0.6ML IJ SOSY
300.0000 ug | PREFILLED_SYRINGE | Freq: Once | INTRAMUSCULAR | Status: AC
  Administered 2014-02-24: 300 ug via SUBCUTANEOUS
  Filled 2014-02-24: qty 0.6

## 2014-02-24 MED ORDER — DARBEPOETIN ALFA-POLYSORBATE 500 MCG/ML IJ SOLN: 300.0000 ug | Freq: Once | INTRAMUSCULAR | Status: DC

## 2014-02-24 NOTE — Patient Instructions (Signed)
Darbepoetin Alfa injection What is this medicine? DARBEPOETIN ALFA (dar be POE e tin AL fa) helps your body make more red blood cells. It is used to treat anemia caused by chronic kidney failure and chemotherapy. This medicine may be used for other purposes; ask your health care provider or pharmacist if you have questions. COMMON BRAND NAME(S): Aranesp What should I tell my health care provider before I take this medicine? They need to know if you have any of these conditions: -blood clotting disorders or history of blood clots -cancer patient not on chemotherapy -cystic fibrosis -heart disease, such as angina, heart failure, or a history of a heart attack -hemoglobin level of 12 g/dL or greater -high blood pressure -low levels of folate, iron, or vitamin B12 -seizures -an unusual or allergic reaction to darbepoetin, erythropoietin, albumin, hamster proteins, latex, other medicines, foods, dyes, or preservatives -pregnant or trying to get pregnant -breast-feeding How should I use this medicine? This medicine is for injection into a vein or under the skin. It is usually given by a health care professional in a hospital or clinic setting. If you get this medicine at home, you will be taught how to prepare and give this medicine. Do not shake the solution before you withdraw a dose. Use exactly as directed. Take your medicine at regular intervals. Do not take your medicine more often than directed. It is important that you put your used needles and syringes in a special sharps container. Do not put them in a trash can. If you do not have a sharps container, call your pharmacist or healthcare provider to get one. Talk to your pediatrician regarding the use of this medicine in children. While this medicine may be used in children as young as 1 year for selected conditions, precautions do apply. Overdosage: If you think you have taken too much of this medicine contact a poison control center or  emergency room at once. NOTE: This medicine is only for you. Do not share this medicine with others. What if I miss a dose? If you miss a dose, take it as soon as you can. If it is almost time for your next dose, take only that dose. Do not take double or extra doses. What may interact with this medicine? Do not take this medicine with any of the following medications: -epoetin alfa This list may not describe all possible interactions. Give your health care provider a list of all the medicines, herbs, non-prescription drugs, or dietary supplements you use. Also tell them if you smoke, drink alcohol, or use illegal drugs. Some items may interact with your medicine. What should I watch for while using this medicine? Visit your prescriber or health care professional for regular checks on your progress and for the needed blood tests and blood pressure measurements. It is especially important for the doctor to make sure your hemoglobin level is in the desired range, to limit the risk of potential side effects and to give you the best benefit. Keep all appointments for any recommended tests. Check your blood pressure as directed. Ask your doctor what your blood pressure should be and when you should contact him or her. As your body makes more red blood cells, you may need to take iron, folic acid, or vitamin B supplements. Ask your doctor or health care provider which products are right for you. If you have kidney disease continue dietary restrictions, even though this medication can make you feel better. Talk with your doctor or health   care professional about the foods you eat and the vitamins that you take. What side effects may I notice from receiving this medicine? Side effects that you should report to your doctor or health care professional as soon as possible: -allergic reactions like skin rash, itching or hives, swelling of the face, lips, or tongue -breathing problems -changes in vision -chest  pain -confusion, trouble speaking or understanding -feeling faint or lightheaded, falls -high blood pressure -muscle aches or pains -pain, swelling, warmth in the leg -rapid weight gain -severe headaches -sudden numbness or weakness of the face, arm or leg -trouble walking, dizziness, loss of balance or coordination -seizures (convulsions) -swelling of the ankles, feet, hands -unusually weak or tired Side effects that usually do not require medical attention (report to your doctor or health care professional if they continue or are bothersome): -diarrhea -fever, chills (flu-like symptoms) -headaches -nausea, vomiting -redness, stinging, or swelling at site where injected This list may not describe all possible side effects. Call your doctor for medical advice about side effects. You may report side effects to FDA at 1-800-FDA-1088. Where should I keep my medicine? Keep out of the reach of children. Store in a refrigerator between 2 and 8 degrees C (36 and 46 degrees F). Do not freeze. Do not shake. Throw away any unused portion if using a single-dose vial. Throw away any unused medicine after the expiration date. NOTE: This sheet is a summary. It may not cover all possible information. If you have questions about this medicine, talk to your doctor, pharmacist, or health care provider.  2015, [employer]. (2008-01-26 10:23:57)  

## 2014-03-01 ENCOUNTER — Ambulatory Visit: Payer: Medicare Other | Admitting: Physician Assistant

## 2014-03-07 ENCOUNTER — Telehealth: Payer: Self-pay | Admitting: Hematology and Oncology

## 2014-03-07 NOTE — Telephone Encounter (Signed)
s.w. pt daughter and advised on 2.11 appt moved to 2.15.16 due to MD on Pal....ok and aware

## 2014-03-08 ENCOUNTER — Telehealth (HOSPITAL_COMMUNITY): Payer: Self-pay

## 2014-03-08 NOTE — Telephone Encounter (Signed)
Encounter complete. 

## 2014-03-10 ENCOUNTER — Ambulatory Visit (HOSPITAL_COMMUNITY)
Admission: RE | Admit: 2014-03-10 | Discharge: 2014-03-10 | Disposition: A | Discharge status: Home / Self Care | Payer: Medicare Other | Source: Ambulatory Visit | Attending: Cardiology | Admitting: Cardiology

## 2014-03-10 DIAGNOSIS — R0609 Other forms of dyspnea: Secondary | ICD-10-CM | POA: Insufficient documentation

## 2014-03-10 DIAGNOSIS — E119 Type 2 diabetes mellitus without complications: Secondary | ICD-10-CM | POA: Insufficient documentation

## 2014-03-10 DIAGNOSIS — I10 Essential (primary) hypertension: Secondary | ICD-10-CM | POA: Insufficient documentation

## 2014-03-10 DIAGNOSIS — E785 Hyperlipidemia, unspecified: Secondary | ICD-10-CM | POA: Insufficient documentation

## 2014-03-10 DIAGNOSIS — Z8249 Family history of ischemic heart disease and other diseases of the circulatory system: Secondary | ICD-10-CM | POA: Diagnosis not present

## 2014-03-10 DIAGNOSIS — R55 Syncope and collapse: Secondary | ICD-10-CM | POA: Insufficient documentation

## 2014-03-10 DIAGNOSIS — Z720 Tobacco use: Secondary | ICD-10-CM | POA: Diagnosis not present

## 2014-03-10 DIAGNOSIS — I509 Heart failure, unspecified: Secondary | ICD-10-CM

## 2014-03-10 MED ORDER — REGADENOSON 0.4 MG/5ML IV SOLN
0.4000 mg | Freq: Once | INTRAVENOUS | Status: AC
  Administered 2014-03-10: 0.4 mg via INTRAVENOUS

## 2014-03-10 MED ORDER — TECHNETIUM TC 99M SESTAMIBI GENERIC - CARDIOLITE
10.8000 | Freq: Once | INTRAVENOUS | Status: AC | PRN
  Administered 2014-03-10: 11 via INTRAVENOUS

## 2014-03-10 MED ORDER — TECHNETIUM TC 99M SESTAMIBI GENERIC - CARDIOLITE
30.9000 | Freq: Once | INTRAVENOUS | Status: AC | PRN
  Administered 2014-03-10: 30.9 via INTRAVENOUS

## 2014-03-10 NOTE — Procedures (Addendum)
[facility] [address] [place] [address] [phone]  Cardiology Nuclear Med Study  Dante GangHelen L Prevost is a 81 y.o. female     MRN : [id number]     DOB: 03/19/1932  Procedure Date: 03/10/2014  Nuclear Med Background Indication for Stress Test:  Evaluation for Ischemia, Post Hospital, Abnormal EKG and *  History:  Ischemic cardiomyopathy;+ Troponins;Seizures;Cardiac murmur;CHFLast NUC MPI on 04/23/2013-normal/scar;EF=not gated Cardiac Risk Factors: Family History - CAD, Hypertension, Lipids, NIDDM and Smokeless tobacco;Hyperkalemia;CKD;Colon DVT;  Symptoms:  DOE, Syncope and Pt had incident in which she was unresponsive in December;Unkown cause.   Nuclear Pre-Procedure Caffeine/Decaff Intake:  9:00pm NPO After: 7:00am   IV Site: R Forearm  IV 0.9% NS with Angio Cath:  22g  Chest Size (in):  n/a IV Started by: Berdie OgrenAmanda Wease, RN  Height: 5\' 3"  (1.6 m)  Cup Size: A  BMI:  Body mass index is 19.14 kg/(m^2). Weight:  108 lb (48.988 kg)   Tech Comments:  n/a    Nuclear Med Study 1 or 2 day study: 1 day  Stress Test Type:  Lexiscan  Order Authorizing Provider:  Nanetta BattyJonathan Berry, MD   Resting Radionuclide: Technetium 5346m Sestamibi  Resting Radionuclide Dose: 10.8 mCi   Stress Radionuclide:  Technetium 3546m Sestamibi  Stress Radionuclide Dose: 30.9 mCi           Stress Protocol Rest HR: 57 Stress HR: 75  Rest BP: 153/89 Stress BP: 166/77  Exercise Time (min): n/a METS: n/a          Dose of Adenosine (mg):  n/a Dose of Lexiscan: 0.4 mg  Dose of Atropine (mg): n/a Dose of Dobutamine: n/a mcg/kg/min (at max HR)  Stress Test Technologist: Ernestene MentionGwen Farrington, CCT Nuclear Technologist: Gonzella LexPam Phillips, CNMT   Rest Procedure:  Myocardial perfusion imaging was performed at rest 45 minutes following the intravenous administration of Technetium 1646m Sestamibi. Stress Procedure:  The patient received IV Lexiscan 0.4 mg over 15-seconds.   Technetium 446m Sestamibi injected at 30-seconds.  There were no significant changes with Lexiscan.  Quantitative spect images were obtained after a 45 minute delay.  Transient Ischemic Dilatation (Normal <1.22):  1.10 QGS EDV:  125 ml QGS ESV:  66 ml LV Ejection Fraction: 47%   Rest ECG: NSR - Normal EKG  Stress ECG: There are scattered PACs.  QPS Raw Data Images:  Normal; no motion artifact; normal heart/lung ratio. Stress Images:  Normal homogeneous uptake in all areas of the myocardium. Rest Images:  Normal homogeneous uptake in all areas of the myocardium. Subtraction (SDS):  No evidence of ischemia.  Impression Exercise Capacity:  Lexiscan with no exercise. BP Response:  Normal blood pressure response. Clinical Symptoms:  No significant symptoms noted. ECG Impression:  No significant ECG changes with Lexiscan. Comparison with Prior Nuclear Study: No images to compare  Overall Impression:  Intermediate risk stress nuclear study without reversible ischemia.  LV Wall Motion:  Mild global hypokinesis, LVEF 47%  Chrystie NoseKenneth C. Dionysios Massman, MD, [facility]ACC Board Certified in Nuclear Cardiology Attending Cardiologist [facility]   Chrystie NoseHILTY,Joziah Dollins C, MD  03/10/2014 4:48 PM

## 2014-03-22 ENCOUNTER — Ambulatory Visit (INDEPENDENT_AMBULATORY_CARE_PROVIDER_SITE_OTHER): Payer: Medicare Other | Admitting: Physician Assistant

## 2014-03-22 ENCOUNTER — Encounter: Payer: Self-pay | Admitting: Physician Assistant

## 2014-03-22 VITALS — BP 160/70 | HR 64 | Ht 63.0 in | Wt 105.5 lb

## 2014-03-22 DIAGNOSIS — R931 Abnormal findings on diagnostic imaging of heart and coronary circulation: Secondary | ICD-10-CM

## 2014-03-22 DIAGNOSIS — I5022 Chronic systolic (congestive) heart failure: Secondary | ICD-10-CM

## 2014-03-22 DIAGNOSIS — R9439 Abnormal result of other cardiovascular function study: Secondary | ICD-10-CM | POA: Insufficient documentation

## 2014-03-22 DIAGNOSIS — N183 Chronic kidney disease, stage 3 unspecified: Secondary | ICD-10-CM

## 2014-03-22 DIAGNOSIS — I10 Essential (primary) hypertension: Secondary | ICD-10-CM

## 2014-03-22 DIAGNOSIS — Z720 Tobacco use: Secondary | ICD-10-CM

## 2014-03-22 DIAGNOSIS — E785 Hyperlipidemia, unspecified: Secondary | ICD-10-CM

## 2014-03-22 MED ORDER — HYDRALAZINE HCL 50 MG PO TABS: 75.0000 mg | ORAL_TABLET | Freq: Three times a day (TID) | ORAL | Status: AC

## 2014-03-22 NOTE — Patient Instructions (Signed)
Your physician has recommended you make the following change in your medication: increase the hydralazine to 75 mg ( 1& 1/2 tablets) 3 times daily. A new prescription has been provided for you today.   Your physician recommends that you schedule a follow-up appointment in: 1 month with Dr. Allyson SabalBerry.

## 2014-03-22 NOTE — Assessment & Plan Note (Signed)
Lasix was recently increased by her primary care provider. Patient appears euvolemic her weight is down 3 pounds.

## 2014-03-22 NOTE — Assessment & Plan Note (Signed)
On a statin 

## 2014-03-22 NOTE — Progress Notes (Signed)
Patient ID: [name], female   DOB: November 25, 1932, 81 y.o.   MRN: [id number]    Date:  03/22/2014   ID:  [name], DOB 08-16-1932, MRN [id number]  PCP:  Olivia Lek, MD  Primary Cardiologist:  Allyson Sabal   Chief Complaint  Patient presents with  . test results     History of Present Illness: [name] is a 81 y.o. female with a history of CKD stage III with AV fistula placed last year, hypertension, anxiety and depression, chronic headaches, diverticular disease. She was last seen by Dr. Allyson Sabal in April 2014. [name] scheduled to have a left hip replacement on 04/27/13. The right was done ~10 yearrs ago.  She does not smoke but dips tobacco and has for ~50 years.    Patient was hospitalized in December with a seizure, accelerated hypertension elevated troponin which peaked at 0.24.  Echocardiogram revealed ejection fraction 35-40%. There was moderate hypokinesis of the entire inferior myocardium. Mild hypokinesis of the entire anterior septal myocardium , grade 1 diastolic dysfunction. Moderate MR left atrium was moderately dilated. PA pressure was 30 mmHg.  During her hospitalization it was thought the cardiac cath was not warranted given her chronic kidney disease..  She presents today for posthospital follow-up. She underwent a nuclear stress test which was read as intermediate risk without reversible ischemia. Ejection fraction 47%. Patient says she has no complaints. No chest pain, nausea, vomiting, shortness of breath, orthopnea, PND. Her activity is very limited and she presents today in a wheelchair.  Her Lasix was recently increased to 40 mg daily.  Her weight is 3 pounds lower than Jan 14th.   She also denies dizziness, PND, cough, congestion, abdominal pain, hematochezia, melena, lower extremity edema, claudication.  Wt Readings from Last 3 Encounters:  03/22/14 105 lb 8 oz (47.854 kg)  03/10/14 108 lb (48.988 kg)  02/17/14 108 lb 11.2 oz (49.306 kg)     Past Medical History    Diagnosis Date  . Hypertension   . Anxiety and depression   . Chronic headaches   . Diverticular disease   . Anemia   . Arthritis     osteoarthritis  . Osteoporosis   . Vitamin B 12 deficiency   . History of ITP   . Anxiety     takes xanax for sleep  . Diabetes mellitus     NIDDM x 12 years  . Heart murmur     benign;asymptomatic  . Chronic kidney disease     ESRD - AV fistula in LUE (Dr. Carlean Jews = nephrologist)  . Left knee pain 02/08/2013  . Cachexia 02/08/2013  . Hyperlipidemia   . Tremors of nervous system   . Constipated 09/30/2013    Current Outpatient Prescriptions  Medication Sig Dispense Refill  . acetaminophen (TYLENOL) 325 MG tablet Take 650 mg by mouth every 6 (six) hours as needed for headache.    Marland Kitchen aspirin 81 MG chewable tablet Chew 1 tablet (81 mg total) by mouth daily. 30 tablet 3  . atorvastatin (LIPITOR) 20 MG tablet Take 20 mg by mouth at bedtime. daily    . Cholecalciferol (VITAMIN D-3) 1000 UNITS CAPS Take 1,000 Units by mouth daily.    . CVS VITAMIN B12 1000 MCG tablet TAKE 1 TABLET (1,000 MCG TOTAL) BY MOUTH DAILY. 30 tablet 6  . docusate sodium (COLACE) 100 MG capsule Take 100 mg by mouth daily as needed for mild constipation.    . furosemide (LASIX) 20 MG  tablet Take 1 tablet (20 mg total) by mouth daily. (Patient taking differently: Take 40 mg by mouth daily. ) 30 tablet 3  . hydrALAZINE (APRESOLINE) 50 MG tablet Take 1.5 tablets (75 mg total) by mouth 3 (three) times daily. 90 tablet 3  . isosorbide mononitrate (IMDUR) 60 MG 24 hr tablet Take 1 tablet (60 mg total) by mouth daily. 30 tablet 3  . levETIRAcetam (KEPPRA) 250 MG tablet Take 1 tablet (250 mg total) by mouth 2 (two) times daily. Please take 2 tablets oral twice daily (500 mg oral twice daily), for the next 3 days ( till 02/19/14) , then resume 1 tablet oral (250 mg) twice daily from 12/27 thereafter. 60 tablet 0  . megestrol (MEGACE) 40 MG tablet TAKE 1 TABLET (40 MG TOTAL) BY MOUTH  2 (TWO) TIMES DAILY. 60 tablet 1  . metoprolol succinate (TOPROL-XL) 50 MG 24 hr tablet Take 1 tablet (50 mg total) by mouth 2 (two) times daily. Take with or immediately following a meal. 60 tablet 3  . mirtazapine (REMERON) 15 MG tablet Take 15 mg by mouth daily.    Marland Kitchen. oxybutynin (DITROPAN-XL) 5 MG 24 hr tablet Take 5 mg by mouth daily.    Marland Kitchen. senna (SENOKOT) 8.6 MG TABS tablet Take 2 tablets (17.2 mg total) by mouth 2 (two) times daily. 120 each 6  . sodium polystyrene (KAYEXALATE) 15 GM/60ML suspension Take 15 g by mouth 3 (three) times a week. Monday, Wednesday and Friday    . tiZANidine (ZANAFLEX) 4 MG tablet Take 1 tablet (4 mg total) by mouth every 6 (six) hours as needed for muscle spasms. 30 tablet 0   No current facility-administered medications for this visit.    Allergies:   No Known Allergies  Social History:  The patient  reports that she has never smoked. Her smokeless tobacco use includes Snuff. She reports that she does not drink alcohol or use illicit drugs.   Family history:   Family History  Problem Relation Age of Onset  . Stroke Mother   . Heart disease Father     ROS:  Please see the history of present illness.  All other systems reviewed and negative.   PHYSICAL EXAM: VS:  BP 160/70 mmHg  Pulse 64  Ht 5\' 3"  (1.6 m)  Wt 105 lb 8 oz (47.854 kg)  BMI 18.69 kg/m2 Thin, well developed, in no acute distress HEENT: Pupils are equal round react to light accommodation extraocular movements are intact.  Neck: no JVDNo cervical lymphadenopathy. Cardiac: Regular rate and rhythm 1 out of 6 late peaking systolic murmur. Lungs:  clear to auscultation bilaterally with decreased breath sounds on the left compared to the right, no wheezing, rhonchi or rales Abd: soft, nontender, positive bowel sounds all quadrants, no hepatosplenomegaly Ext: no lower extremity edema.  2+ radial and dorsalis pedis pulses. Skin: warm and dry Neuro:  Grossly normal      ASSESSMENT AND  PLAN:  Problem List Items Addressed This Visit    Tobacco abuse    She continues to dip tobacco      HLD (hyperlipidemia)    On a statin      Relevant Medications   hydrALAZINE (APRESOLINE) tablet   Essential hypertension - Primary    Blood pressure elevated. Increase hydralazine to 75 mg 3 times daily.      Relevant Medications   hydrALAZINE (APRESOLINE) tablet   CKD (chronic kidney disease) stage 3, GFR 30-59 ml/min   CHF (congestive heart failure)  Lasix was recently increased by her primary care provider. Patient appears euvolemic her weight is down 3 pounds.      Relevant Medications   hydrALAZINE (APRESOLINE) tablet   Abnormal nuclear stress test    Stress test revealed no reversible ischemia and ejection fraction 47%. She denies any chest pain however, she is not very active.  Given her lack of symptoms and CKD, I do not think further evaluation is needed.

## 2014-03-22 NOTE — Assessment & Plan Note (Addendum)
Stress test revealed no reversible ischemia and ejection fraction 47%. She denies any chest pain however, she is not very active.  Given her lack of symptoms and CKD, I do not think further evaluation is needed.

## 2014-03-22 NOTE — Assessment & Plan Note (Addendum)
Blood pressure elevated. Increase hydralazine to 75 mg 3 times daily.

## 2014-03-22 NOTE — Assessment & Plan Note (Signed)
She continues to dip tobacco

## 2014-03-30 ENCOUNTER — Other Ambulatory Visit: Payer: Self-pay | Admitting: Hematology and Oncology

## 2014-04-05 ENCOUNTER — Telehealth: Payer: Self-pay | Admitting: Cardiovascular Disease

## 2014-04-05 NOTE — Telephone Encounter (Signed)
Received records from [facility] (Dr Camille Balynthia Dunham) for appointment with Dr Allyson SabalBerry on 05/03/14.  [facility] for Dr Hazle CocaBerry's schedule on 05/03/14.  lp

## 2014-04-07 ENCOUNTER — Ambulatory Visit: Payer: Medicare Other | Admitting: Hematology and Oncology

## 2014-04-07 ENCOUNTER — Other Ambulatory Visit: Payer: Medicare Other

## 2014-04-07 ENCOUNTER — Ambulatory Visit: Payer: Medicare Other

## 2014-04-07 ENCOUNTER — Telehealth: Payer: Self-pay | Admitting: Hematology and Oncology

## 2014-04-07 NOTE — Telephone Encounter (Signed)
s.w pt and confirmed appt. °

## 2014-04-11 ENCOUNTER — Ambulatory Visit: Payer: Self-pay

## 2014-04-11 ENCOUNTER — Ambulatory Visit: Payer: Self-pay | Admitting: Hematology and Oncology

## 2014-04-11 ENCOUNTER — Telehealth: Payer: Self-pay | Admitting: Hematology and Oncology

## 2014-04-11 ENCOUNTER — Other Ambulatory Visit: Payer: Medicare Other

## 2014-04-11 NOTE — Telephone Encounter (Signed)
Confirm appt for 02/25. Mailed cal

## 2014-04-21 ENCOUNTER — Ambulatory Visit (HOSPITAL_BASED_OUTPATIENT_CLINIC_OR_DEPARTMENT_OTHER): Payer: Medicare Other | Admitting: Hematology and Oncology

## 2014-04-21 ENCOUNTER — Encounter: Payer: Self-pay | Admitting: Hematology and Oncology

## 2014-04-21 ENCOUNTER — Telehealth: Payer: Self-pay | Admitting: Hematology and Oncology

## 2014-04-21 ENCOUNTER — Ambulatory Visit: Payer: Medicare Other

## 2014-04-21 ENCOUNTER — Telehealth: Payer: Self-pay

## 2014-04-21 ENCOUNTER — Other Ambulatory Visit (HOSPITAL_BASED_OUTPATIENT_CLINIC_OR_DEPARTMENT_OTHER): Payer: Medicare Other

## 2014-04-21 VITALS — BP 176/48 | HR 59 | Temp 97.0°F | Resp 18 | Ht 63.0 in | Wt 103.7 lb

## 2014-04-21 DIAGNOSIS — N189 Chronic kidney disease, unspecified: Principal | ICD-10-CM

## 2014-04-21 DIAGNOSIS — R636 Underweight: Secondary | ICD-10-CM

## 2014-04-21 DIAGNOSIS — N183 Chronic kidney disease, stage 3 unspecified: Secondary | ICD-10-CM

## 2014-04-21 DIAGNOSIS — D631 Anemia in chronic kidney disease: Secondary | ICD-10-CM

## 2014-04-21 DIAGNOSIS — D696 Thrombocytopenia, unspecified: Secondary | ICD-10-CM

## 2014-04-21 DIAGNOSIS — D519 Vitamin B12 deficiency anemia, unspecified: Secondary | ICD-10-CM

## 2014-04-21 DIAGNOSIS — R64 Cachexia: Secondary | ICD-10-CM

## 2014-04-21 DIAGNOSIS — I10 Essential (primary) hypertension: Secondary | ICD-10-CM

## 2014-04-21 DIAGNOSIS — N289 Disorder of kidney and ureter, unspecified: Secondary | ICD-10-CM

## 2014-04-21 LAB — CBC WITH DIFFERENTIAL/PLATELET
BASO%: 1 % (ref 0.0–2.0)
BASOS ABS: 0 10*3/uL (ref 0.0–0.1)
EOS%: 6.8 % (ref 0.0–7.0)
Eosinophils Absolute: 0.3 10*3/uL (ref 0.0–0.5)
HCT: 31.8 % — ABNORMAL LOW (ref 34.8–46.6)
HGB: 10 g/dL — ABNORMAL LOW (ref 11.6–15.9)
LYMPH#: 1.3 10*3/uL (ref 0.9–3.3)
LYMPH%: 33.2 % (ref 14.0–49.7)
MCH: 29.3 pg (ref 25.1–34.0)
MCHC: 31.4 g/dL — AB (ref 31.5–36.0)
MCV: 93.3 fL (ref 79.5–101.0)
MONO#: 0.4 10*3/uL (ref 0.1–0.9)
MONO%: 10.8 % (ref 0.0–14.0)
NEUT#: 1.9 10*3/uL (ref 1.5–6.5)
NEUT%: 48.2 % (ref 38.4–76.8)
Platelets: 90 10*3/uL — ABNORMAL LOW (ref 145–400)
RBC: 3.41 10*6/uL — AB (ref 3.70–5.45)
RDW: 14 % (ref 11.2–14.5)
WBC: 4 10*3/uL (ref 3.9–10.3)

## 2014-04-21 MED ORDER — MEGESTROL ACETATE 40 MG PO TABS: 40.0000 mg | ORAL_TABLET | Freq: Two times a day (BID) | ORAL | Status: DC

## 2014-04-21 NOTE — Progress Notes (Signed)
Per [facility] MonsBlake sending [employer] forms for pre auth for megstrol 40 mg

## 2014-04-21 NOTE — Telephone Encounter (Signed)
Pt confirmed labs/ov/inj per 02/25 POF, gave pt AVS.... KJ °

## 2014-04-21 NOTE — Telephone Encounter (Signed)
[facility]

## 2014-04-22 ENCOUNTER — Encounter: Payer: Self-pay | Admitting: Hematology and Oncology

## 2014-04-22 NOTE — Assessment & Plan Note (Signed)
She felt Megace was helpful. I refilled the prescription.

## 2014-04-22 NOTE — Progress Notes (Signed)
Megestrol 40mg  tablet 60 for 30days--  Take 1 tablet (40mg  total) by mouth 2 times per day.

## 2014-04-22 NOTE — Assessment & Plan Note (Signed)
The cause is unknown. It is mild and there is little change compared from previous platelet count. The patient denies recent history of bleeding such as epistaxis, hematuria or hematochezia. [name] asymptomatic from the thrombocytopenia. I will observe for now.  she does not require transfusion now.    

## 2014-04-22 NOTE — Assessment & Plan Note (Signed)
Her creatinine was stable at the range of 2. This is related to chronic hypertension.

## 2014-04-22 NOTE — Progress Notes (Signed)
[facility] OFFICE PROGRESS NOTE  Patient Care Team: Delorse LekBrent A Burnett, MD as PCP - General (Family Medicine) Camille Balynthia Dunham, MD (Nephrology)  SUMMARY OF ONCOLOGIC HISTORY:  This is a pleasant lady was found to have anemia. Previously she was found to have B12 deficiency and had been receiving B12 injection monthly. For the anemia chronic disease she was getting Aranesp. She had mild thrombocytopenia which resolved with low-dose prednisone.  INTERVAL HISTORY: Please see below for problem oriented charting. She feels well apart from poor appetite. She requested prescription refill of Megace. She denies low energy level. No chest pain or shortness of breath. The patient denies any recent signs or symptoms of bleeding such as spontaneous epistaxis, hematuria or hematochezia.  REVIEW OF SYSTEMS:   Constitutional: Denies fevers, chills or abnormal weight loss Eyes: Denies blurriness of vision Ears, nose, mouth, throat, and face: Denies mucositis or sore throat Respiratory: Denies cough, dyspnea or wheezes Cardiovascular: Denies palpitation, chest discomfort or lower extremity swelling Gastrointestinal:  Denies nausea, heartburn or change in bowel habits Skin: Denies abnormal skin rashes Lymphatics: Denies new lymphadenopathy or easy bruising Neurological:Denies numbness, tingling or new weaknesses Behavioral/Psych: Mood is stable, no new changes  All other systems were reviewed with the patient and are negative.  I have reviewed the past medical history, past surgical history, social history and family history with the patient and they are unchanged from previous note.  ALLERGIES:  has No Known Allergies.  MEDICATIONS:  Current Outpatient Prescriptions  Medication Sig Dispense Refill  . acetaminophen (TYLENOL) 325 MG tablet Take 650 mg by mouth every 6 (six) hours as needed for headache.    Marland Kitchen. aspirin 81 MG chewable tablet Chew 1 tablet (81 mg total) by mouth daily. 30  tablet 3  . atorvastatin (LIPITOR) 20 MG tablet Take 20 mg by mouth at bedtime. daily    . Cholecalciferol (VITAMIN D-3) 1000 UNITS CAPS Take 1,000 Units by mouth daily.    . CVS VITAMIN B12 1000 MCG tablet TAKE 1 TABLET (1,000 MCG TOTAL) BY MOUTH DAILY. 30 tablet 6  . docusate sodium (COLACE) 100 MG capsule Take 100 mg by mouth daily as needed for mild constipation.    . furosemide (LASIX) 20 MG tablet Take 1 tablet (20 mg total) by mouth daily. (Patient taking differently: Take 40 mg by mouth daily. ) 30 tablet 3  . hydrALAZINE (APRESOLINE) 50 MG tablet Take 1.5 tablets (75 mg total) by mouth 3 (three) times daily. 90 tablet 3  . isosorbide mononitrate (IMDUR) 60 MG 24 hr tablet Take 1 tablet (60 mg total) by mouth daily. 30 tablet 3  . levETIRAcetam (KEPPRA) 250 MG tablet Take 1 tablet (250 mg total) by mouth 2 (two) times daily. Please take 2 tablets oral twice daily (500 mg oral twice daily), for the next 3 days ( till 02/19/14) , then resume 1 tablet oral (250 mg) twice daily from 12/27 thereafter. 60 tablet 0  . megestrol (MEGACE) 40 MG tablet Take 1 tablet (40 mg total) by mouth 2 (two) times daily. 60 tablet 6  . metoprolol succinate (TOPROL-XL) 50 MG 24 hr tablet Take 1 tablet (50 mg total) by mouth 2 (two) times daily. Take with or immediately following a meal. 60 tablet 3  . mirtazapine (REMERON) 15 MG tablet Take 15 mg by mouth daily.    Marland Kitchen. oxybutynin (DITROPAN-XL) 5 MG 24 hr tablet Take 5 mg by mouth daily.    Marland Kitchen. senna (SENOKOT) 8.6 MG TABS tablet  Take 2 tablets (17.2 mg total) by mouth 2 (two) times daily. 120 each 6  . sodium polystyrene (KAYEXALATE) 15 GM/60ML suspension Take 15 g by mouth 3 (three) times a week. Monday, Wednesday and Friday    . tiZANidine (ZANAFLEX) 4 MG tablet Take 1 tablet (4 mg total) by mouth every 6 (six) hours as needed for muscle spasms. 30 tablet 0   No current facility-administered medications for this visit.    PHYSICAL EXAMINATION: ECOG PERFORMANCE  STATUS: 1 - Symptomatic but completely ambulatory  Filed Vitals:   04/21/14 1044  BP: 176/48  Pulse: 59  Temp: 97 F (36.1 C)  Resp: 18   Filed Weights   04/21/14 1044  Weight: 103 lb 11.2 oz (47.038 kg)    GENERAL:alert, no distress and comfortable. She looks thin and cachectic SKIN: skin color, texture, turgor are normal, no rashes or significant lesions EYES: normal, Conjunctiva are pale and non-injected, sclera clear OROPHARYNX:no exudate, no erythema and lips, buccal mucosa, and tongue normal . Poor dentition Musculoskeletal:no cyanosis of digits and no clubbing  NEURO: alert & oriented x 3 with fluent speech, no focal motor/sensory deficits  LABORATORY DATA:  I have reviewed the data as listed    Component Value Date/Time   NA 137 02/17/2014 0825   NA 137 08/10/2013 1009   K 4.5 02/17/2014 0825   K 5.0 08/10/2013 1009   CL 103 02/17/2014 0825   CL 106 07/10/2012 0909   CO2 27 02/17/2014 0825   CO2 20* 08/10/2013 1009   GLUCOSE 88 02/17/2014 0825   GLUCOSE 99 08/10/2013 1009   GLUCOSE 102* 07/10/2012 0909   BUN 29* 02/17/2014 0825   BUN 60.3* 08/10/2013 1009   CREATININE 2.08* 02/17/2014 0825   CREATININE 2.5* 08/10/2013 1009   CALCIUM 8.4 02/17/2014 0825   CALCIUM 8.8 08/10/2013 1009   PROT 4.8* 02/17/2014 0825   PROT 5.9* 08/10/2013 1009   ALBUMIN 2.8* 02/17/2014 0825   ALBUMIN 3.2* 08/10/2013 1009   AST 23 02/17/2014 0825   AST 11 08/10/2013 1009   ALT 27 02/17/2014 0825   ALT 6 08/10/2013 1009   ALKPHOS 56 02/17/2014 0825   ALKPHOS 54 08/10/2013 1009   BILITOT 0.3 02/17/2014 0825   BILITOT 0.50 08/10/2013 1009   GFRNONAA 22* 02/17/2014 0825   GFRAA 25* 02/17/2014 0825    No results found for: SPEP, UPEP  Lab Results  Component Value Date   WBC 4.0 04/21/2014   NEUTROABS 1.9 04/21/2014   HGB 10.0* 04/21/2014   HCT 31.8* 04/21/2014   MCV 93.3 04/21/2014   PLT 90* 04/21/2014      Chemistry      Component Value Date/Time   NA 137  02/17/2014 0825   NA 137 08/10/2013 1009   K 4.5 02/17/2014 0825   K 5.0 08/10/2013 1009   CL 103 02/17/2014 0825   CL 106 07/10/2012 0909   CO2 27 02/17/2014 0825   CO2 20* 08/10/2013 1009   BUN 29* 02/17/2014 0825   BUN 60.3* 08/10/2013 1009   CREATININE 2.08* 02/17/2014 0825   CREATININE 2.5* 08/10/2013 1009      Component Value Date/Time   CALCIUM 8.4 02/17/2014 0825   CALCIUM 8.8 08/10/2013 1009   ALKPHOS 56 02/17/2014 0825   ALKPHOS 54 08/10/2013 1009   AST 23 02/17/2014 0825   AST 11 08/10/2013 1009   ALT 27 02/17/2014 0825   ALT 6 08/10/2013 1009   BILITOT 0.3 02/17/2014 0825   BILITOT 0.50 08/10/2013  1009       RADIOGRAPHIC STUDIES: I have personally reviewed the radiological images as listed and agreed with the findings in the report. No results found.   ASSESSMENT & PLAN:  Anemia of renal disease She will only receive her Aranesp injection to keep hemoglobin above 10 g. She does not need injection today. I am changing the frequency of visits and injection to every 8 weeks.     Cachexia She felt Megace was helpful. I refilled the prescription.   CKD (chronic kidney disease) stage 3, GFR 30-59 ml/min Her creatinine was stable at the range of 2. This is related to chronic hypertension.   Essential hypertension Her blood pressure remains suboptimally controlled. I told the patient, if her systolic blood pressures over 161, she will not get injection from [place]. She will continue to work with her PCP for medication adjustment   Thrombocytopenia The cause is unknown. It is mild and there is little change compared from previous platelet count. The patient denies recent history of bleeding such as epistaxis, hematuria or hematochezia. [name] asymptomatic from the thrombocytopenia. I will observe for now.  she does not require transfusion now.      Vitamin B12 deficiency anemia There is remote history of low vitamin B-12. I will recheck in the next  visit.    Orders Placed This Encounter  Procedures  . Iron and TIBC    Standing Status: Future     Number of Occurrences:      Standing Expiration Date: 05/27/2015  . Ferritin    Standing Status: Future     Number of Occurrences:      Standing Expiration Date: 05/27/2015  . Vitamin B12    Standing Status: Future     Number of Occurrences:      Standing Expiration Date: 05/27/2015   All questions were answered. The patient knows to call the clinic with any problems, questions or concerns. No barriers to learning was detected. I spent 25 minutes counseling the patient face to face. The total time spent in the appointment was 30 minutes and more than 50% was on counseling and review of test results     [facility], Lucia Mccreadie, MD 04/22/2014 2:35 PM

## 2014-04-22 NOTE — Progress Notes (Signed)
Faxed to [employer] pre auth for megastrol 40mg  tablet

## 2014-04-22 NOTE — Progress Notes (Signed)
Received approval from [employer] for megestrol tablet  01/22/14-04/22/15.sending to medical records

## 2014-04-22 NOTE — Assessment & Plan Note (Signed)
Her blood pressure remains suboptimally controlled. I told the patient, if her systolic blood pressures over 409170, she will not get injection from us. She will continue to work with her PCP for medication adjustment

## 2014-04-22 NOTE — Assessment & Plan Note (Addendum)
She will only receive her Aranesp injection to keep hemoglobin above 10 g. She does not need injection today. I am changing the frequency of visits and injection to every 8 weeks.

## 2014-04-22 NOTE — Assessment & Plan Note (Signed)
There is remote history of low vitamin B-12. I will recheck in the next visit.

## 2014-05-03 ENCOUNTER — Ambulatory Visit (INDEPENDENT_AMBULATORY_CARE_PROVIDER_SITE_OTHER): Payer: Medicare Other | Admitting: Cardiovascular Disease

## 2014-05-03 ENCOUNTER — Ambulatory Visit: Payer: Medicare Other | Admitting: Cardiovascular Disease

## 2014-05-03 ENCOUNTER — Encounter: Payer: Self-pay | Admitting: Cardiovascular Disease

## 2014-05-03 VITALS — BP 147/64 | HR 68 | Ht 63.0 in | Wt 106.0 lb

## 2014-05-03 DIAGNOSIS — I10 Essential (primary) hypertension: Secondary | ICD-10-CM

## 2014-05-03 DIAGNOSIS — R931 Abnormal findings on diagnostic imaging of heart and coronary circulation: Secondary | ICD-10-CM

## 2014-05-03 DIAGNOSIS — E785 Hyperlipidemia, unspecified: Secondary | ICD-10-CM | POA: Diagnosis not present

## 2014-05-03 DIAGNOSIS — R9439 Abnormal result of other cardiovascular function study: Secondary | ICD-10-CM

## 2014-05-03 NOTE — Patient Instructions (Signed)
We request that you follow-up in: 6 months (Bryan) with an extender and in 1 year with Dr Berry  You will receive a reminder letter in the mail two months in advance. If you don't receive a letter, please call our office to schedule the follow-up appointment.   

## 2014-05-03 NOTE — Assessment & Plan Note (Signed)
Her Myoview stress test showed an ejection fraction of 47% without evidence of ischemia.

## 2014-05-03 NOTE — Progress Notes (Signed)
05/03/2014 [name]   [birth year]  [id number]  Primary Physician Olivia Lek, MD Primary Cardiologist: Olivia Gess MD [name]   HPI:  [name] is a 81 year old thin and frail appearing widowed Caucasian female mother of 5 children who I apparently saw several years ago. Her primary care physician is Dr. Marjory Adkins. She lives with her son. Her problems include type 2 diabetes, treated hypertension and hyperlipidemia. She has never had a heart attack or stroke. She's had bilateral total hip replacements. She was hospitalized in December for seizure activity and had mildly elevated troponins and abnormal EKG. A 2-D echo showed showed moderate LV dysfunction with ejection fraction in the 35-40% range with moderate hypokinesia of the entire inferior wall as well as anteroseptal. A Myoview stress test was low risk and showed no ischemia. The patient denies chest pain or shortness of breath. [name] for all intents and purposes wheelchair-bound because of weakness.   Current Outpatient Prescriptions  Medication Sig Dispense Refill  . acetaminophen (TYLENOL) 325 MG tablet Take 650 mg by mouth every 6 (six) hours as needed for headache.    Marland Kitchen aspirin 81 MG chewable tablet Chew 1 tablet (81 mg total) by mouth daily. 30 tablet 3  . atorvastatin (LIPITOR) 20 MG tablet Take 20 mg by mouth at bedtime. daily    . Cholecalciferol (VITAMIN D-3) 1000 UNITS CAPS Take 1,000 Units by mouth daily.    . cloNIDine (CATAPRES) 0.1 MG tablet Take 0.1 mg by mouth 3 (three) times daily.     . CVS VITAMIN B12 1000 MCG tablet TAKE 1 TABLET (1,000 MCG TOTAL) BY MOUTH DAILY. 30 tablet 6  . docusate sodium (COLACE) 100 MG capsule Take 100 mg by mouth daily as needed for mild constipation.    . furosemide (LASIX) 20 MG tablet Take 1 tablet (20 mg total) by mouth daily. (Patient taking differently: Take 40 mg by mouth daily. ) 30 tablet 3  . hydrALAZINE (APRESOLINE) 50 MG tablet Take 1.5 tablets  (75 mg total) by mouth 3 (three) times daily. 90 tablet 3  . isosorbide mononitrate (IMDUR) 60 MG 24 hr tablet Take 1 tablet (60 mg total) by mouth daily. 30 tablet 3  . KIONEX 15 GM/60ML suspension Take 15 g by mouth 3 (three) times a week.   5  . levETIRAcetam (KEPPRA) 250 MG tablet Take 1 tablet (250 mg total) by mouth 2 (two) times daily. Please take 2 tablets oral twice daily (500 mg oral twice daily), for the next 3 days ( till 02/19/14) , then resume 1 tablet oral (250 mg) twice daily from 12/27 thereafter. 60 tablet 0  . megestrol (MEGACE) 40 MG tablet Take 40 mg by mouth 2 (two) times daily.    . metoprolol succinate (TOPROL-XL) 50 MG 24 hr tablet Take 1 tablet (50 mg total) by mouth 2 (two) times daily. Take with or immediately following a meal. 60 tablet 3  . mirtazapine (REMERON) 15 MG tablet Take 15 mg by mouth daily.    Marland Kitchen oxybutynin (DITROPAN-XL) 5 MG 24 hr tablet Take 5 mg by mouth daily.    Marland Kitchen senna (SENOKOT) 8.6 MG TABS tablet Take 2 tablets (17.2 mg total) by mouth 2 (two) times daily. 120 each 6  . tiZANidine (ZANAFLEX) 4 MG tablet Take 1 tablet (4 mg total) by mouth every 6 (six) hours as needed for muscle spasms. 30 tablet 0   No current facility-administered medications for this visit.  No Known Allergies  History   Social History  . Marital Status: Widowed    Spouse Name: N/A  . Number of Children: 5  . Years of Education: N/A   Occupational History  . Not on file.   Social History Main Topics  . Smoking status: Never Smoker   . Smokeless tobacco: Current User    Types: Snuff  . Alcohol Use: No  . Drug Use: No  . Sexual Activity: No   Other Topics Concern  . Not on file   Social History Narrative     Review of Systems: General: negative for chills, fever, night sweats or weight changes.  Cardiovascular: negative for chest pain, dyspnea on exertion, edema, orthopnea, palpitations, paroxysmal nocturnal dyspnea or shortness of breath Dermatological:  negative for rash Respiratory: negative for cough or wheezing Urologic: negative for hematuria Abdominal: negative for nausea, vomiting, diarrhea, bright red blood per rectum, melena, or hematemesis Neurologic: negative for visual changes, syncope, or dizziness All other systems reviewed and are otherwise negative except as noted above.    Blood pressure 147/64, pulse 68, height 5\' 3"  (1.6 m), weight 106 lb (48.081 kg).  General appearance: alert and no distress Neck: no adenopathy, no carotid bruit, no JVD, supple, symmetrical, trachea midline and thyroid not enlarged, symmetric, no tenderness/mass/nodules Lungs: clear to auscultation bilaterally Heart: regular rate and rhythm, S1, S2 normal, no murmur, click, rub or gallop Extremities: extremities normal, atraumatic, no cyanosis or edema  EKG not performed today  ASSESSMENT AND PLAN:   Essential hypertension History of hypertension blood pressure measured 147/64. [name] on clonidine, hydralazine and metoprolol as well as isosorbide. Continue current meds at current dosing   Abnormal nuclear stress test Her Myoview stress test showed an ejection fraction of 47% without evidence of ischemia.   HLD (hyperlipidemia) History of hyperlipidemia on atorvastatin 20 mg a day followed by her PCP       Olivia GessJonathan J. Leovardo Thoman MD [facility] CenterFACP,FACC,FAHA, [facility]SCAI 05/03/2014 4:56 PM

## 2014-05-03 NOTE — Assessment & Plan Note (Signed)
History of hypertension blood pressure measured 147/64. [name] on clonidine, hydralazine and metoprolol as well as isosorbide. Continue current meds at current dosing

## 2014-05-03 NOTE — Assessment & Plan Note (Signed)
History of hyperlipidemia on atorvastatin 20 mg a day followed by her PCP 

## 2014-05-10 ENCOUNTER — Inpatient Hospital Stay (HOSPITAL_COMMUNITY): Payer: Medicare Other

## 2014-05-10 ENCOUNTER — Inpatient Hospital Stay (HOSPITAL_COMMUNITY)
Admission: EM | Admit: 2014-05-10 | Discharge: 2014-05-14 | DRG: 964 | Disposition: A | Discharge status: Home Health | Payer: Medicare Other | Attending: Internal Medicine | Admitting: Internal Medicine

## 2014-05-10 ENCOUNTER — Emergency Department (HOSPITAL_COMMUNITY): Payer: Medicare Other

## 2014-05-10 ENCOUNTER — Other Ambulatory Visit (HOSPITAL_COMMUNITY): Payer: Self-pay

## 2014-05-10 ENCOUNTER — Encounter (HOSPITAL_COMMUNITY): Payer: Self-pay | Admitting: Emergency Medicine

## 2014-05-10 DIAGNOSIS — Z8249 Family history of ischemic heart disease and other diseases of the circulatory system: Secondary | ICD-10-CM | POA: Diagnosis not present

## 2014-05-10 DIAGNOSIS — B961 Klebsiella pneumoniae [K. pneumoniae] as the cause of diseases classified elsewhere: Secondary | ICD-10-CM | POA: Diagnosis not present

## 2014-05-10 DIAGNOSIS — D696 Thrombocytopenia, unspecified: Secondary | ICD-10-CM | POA: Diagnosis present

## 2014-05-10 DIAGNOSIS — Z90710 Acquired absence of both cervix and uterus: Secondary | ICD-10-CM

## 2014-05-10 DIAGNOSIS — F17220 Nicotine dependence, chewing tobacco, uncomplicated: Secondary | ICD-10-CM | POA: Diagnosis present

## 2014-05-10 DIAGNOSIS — B9620 Unspecified Escherichia coli [E. coli] as the cause of diseases classified elsewhere: Secondary | ICD-10-CM | POA: Diagnosis not present

## 2014-05-10 DIAGNOSIS — E538 Deficiency of other specified B group vitamins: Secondary | ICD-10-CM | POA: Diagnosis present

## 2014-05-10 DIAGNOSIS — N184 Chronic kidney disease, stage 4 (severe): Secondary | ICD-10-CM | POA: Diagnosis present

## 2014-05-10 DIAGNOSIS — Y92002 Bathroom of unspecified non-institutional (private) residence single-family (private) house as the place of occurrence of the external cause: Secondary | ICD-10-CM | POA: Diagnosis not present

## 2014-05-10 DIAGNOSIS — S0011XA Contusion of right eyelid and periocular area, initial encounter: Secondary | ICD-10-CM | POA: Diagnosis present

## 2014-05-10 DIAGNOSIS — H05231 Hemorrhage of right orbit: Secondary | ICD-10-CM

## 2014-05-10 DIAGNOSIS — F419 Anxiety disorder, unspecified: Secondary | ICD-10-CM | POA: Diagnosis present

## 2014-05-10 DIAGNOSIS — Z79899 Other long term (current) drug therapy: Secondary | ICD-10-CM

## 2014-05-10 DIAGNOSIS — I129 Hypertensive chronic kidney disease with stage 1 through stage 4 chronic kidney disease, or unspecified chronic kidney disease: Secondary | ICD-10-CM | POA: Diagnosis present

## 2014-05-10 DIAGNOSIS — I5042 Chronic combined systolic (congestive) and diastolic (congestive) heart failure: Secondary | ICD-10-CM | POA: Diagnosis present

## 2014-05-10 DIAGNOSIS — Z96643 Presence of artificial hip joint, bilateral: Secondary | ICD-10-CM | POA: Diagnosis present

## 2014-05-10 DIAGNOSIS — I120 Hypertensive chronic kidney disease with stage 5 chronic kidney disease or end stage renal disease: Secondary | ICD-10-CM | POA: Diagnosis present

## 2014-05-10 DIAGNOSIS — W010XXA Fall on same level from slipping, tripping and stumbling without subsequent striking against object, initial encounter: Secondary | ICD-10-CM | POA: Diagnosis present

## 2014-05-10 DIAGNOSIS — E119 Type 2 diabetes mellitus without complications: Secondary | ICD-10-CM | POA: Diagnosis present

## 2014-05-10 DIAGNOSIS — I509 Heart failure, unspecified: Secondary | ICD-10-CM

## 2014-05-10 DIAGNOSIS — F329 Major depressive disorder, single episode, unspecified: Secondary | ICD-10-CM | POA: Diagnosis present

## 2014-05-10 DIAGNOSIS — S065X0A Traumatic subdural hemorrhage without loss of consciousness, initial encounter: Secondary | ICD-10-CM | POA: Diagnosis present

## 2014-05-10 DIAGNOSIS — N179 Acute kidney failure, unspecified: Secondary | ICD-10-CM | POA: Diagnosis present

## 2014-05-10 DIAGNOSIS — M1990 Unspecified osteoarthritis, unspecified site: Secondary | ICD-10-CM | POA: Diagnosis present

## 2014-05-10 DIAGNOSIS — N189 Chronic kidney disease, unspecified: Secondary | ICD-10-CM | POA: Diagnosis present

## 2014-05-10 DIAGNOSIS — R251 Tremor, unspecified: Secondary | ICD-10-CM | POA: Diagnosis present

## 2014-05-10 DIAGNOSIS — K5900 Constipation, unspecified: Secondary | ICD-10-CM | POA: Diagnosis present

## 2014-05-10 DIAGNOSIS — S72111A Displaced fracture of greater trochanter of right femur, initial encounter for closed fracture: Secondary | ICD-10-CM

## 2014-05-10 DIAGNOSIS — S065XAA Traumatic subdural hemorrhage with loss of consciousness status unknown, initial encounter: Secondary | ICD-10-CM | POA: Diagnosis present

## 2014-05-10 DIAGNOSIS — E785 Hyperlipidemia, unspecified: Secondary | ICD-10-CM | POA: Diagnosis present

## 2014-05-10 DIAGNOSIS — Z515 Encounter for palliative care: Secondary | ICD-10-CM | POA: Diagnosis not present

## 2014-05-10 DIAGNOSIS — Z7982 Long term (current) use of aspirin: Secondary | ICD-10-CM | POA: Diagnosis not present

## 2014-05-10 DIAGNOSIS — R0600 Dyspnea, unspecified: Secondary | ICD-10-CM | POA: Diagnosis not present

## 2014-05-10 DIAGNOSIS — D62 Acute posthemorrhagic anemia: Secondary | ICD-10-CM | POA: Diagnosis present

## 2014-05-10 DIAGNOSIS — I10 Essential (primary) hypertension: Secondary | ICD-10-CM | POA: Diagnosis present

## 2014-05-10 DIAGNOSIS — S72114A Nondisplaced fracture of greater trochanter of right femur, initial encounter for closed fracture: Secondary | ICD-10-CM | POA: Diagnosis present

## 2014-05-10 DIAGNOSIS — Z992 Dependence on renal dialysis: Secondary | ICD-10-CM

## 2014-05-10 DIAGNOSIS — Y9389 Activity, other specified: Secondary | ICD-10-CM

## 2014-05-10 DIAGNOSIS — M810 Age-related osteoporosis without current pathological fracture: Secondary | ICD-10-CM | POA: Diagnosis present

## 2014-05-10 DIAGNOSIS — Y92009 Unspecified place in unspecified non-institutional (private) residence as the place of occurrence of the external cause: Secondary | ICD-10-CM

## 2014-05-10 DIAGNOSIS — S0511XA Contusion of eyeball and orbital tissues, right eye, initial encounter: Secondary | ICD-10-CM | POA: Diagnosis present

## 2014-05-10 DIAGNOSIS — Z9049 Acquired absence of other specified parts of digestive tract: Secondary | ICD-10-CM | POA: Diagnosis present

## 2014-05-10 DIAGNOSIS — S065X9A Traumatic subdural hemorrhage with loss of consciousness of unspecified duration, initial encounter: Secondary | ICD-10-CM

## 2014-05-10 DIAGNOSIS — I6200 Nontraumatic subdural hemorrhage, unspecified: Secondary | ICD-10-CM

## 2014-05-10 DIAGNOSIS — Z720 Tobacco use: Secondary | ICD-10-CM | POA: Diagnosis present

## 2014-05-10 DIAGNOSIS — N390 Urinary tract infection, site not specified: Secondary | ICD-10-CM | POA: Diagnosis not present

## 2014-05-10 DIAGNOSIS — N186 End stage renal disease: Secondary | ICD-10-CM | POA: Diagnosis present

## 2014-05-10 DIAGNOSIS — W19XXXA Unspecified fall, initial encounter: Secondary | ICD-10-CM

## 2014-05-10 LAB — URINALYSIS W MICROSCOPIC (NOT AT ARMC)
Bilirubin Urine: NEGATIVE
Glucose, UA: NEGATIVE mg/dL
Hgb urine dipstick: NEGATIVE
KETONES UR: NEGATIVE mg/dL
NITRITE: NEGATIVE
Protein, ur: 30 mg/dL — AB
Specific Gravity, Urine: 1.013 (ref 1.005–1.030)
Urobilinogen, UA: 1 mg/dL (ref 0.0–1.0)
pH: 8 (ref 5.0–8.0)

## 2014-05-10 LAB — CBC WITH DIFFERENTIAL/PLATELET
Basophils Absolute: 0 10*3/uL (ref 0.0–0.1)
Basophils Relative: 0 % (ref 0–1)
EOS PCT: 3 % (ref 0–5)
Eosinophils Absolute: 0.2 10*3/uL (ref 0.0–0.7)
HEMATOCRIT: 25.3 % — AB (ref 36.0–46.0)
HEMOGLOBIN: 8.1 g/dL — AB (ref 12.0–15.0)
LYMPHS PCT: 16 % (ref 12–46)
Lymphs Abs: 1.6 10*3/uL (ref 0.7–4.0)
MCH: 29.1 pg (ref 26.0–34.0)
MCHC: 32 g/dL (ref 30.0–36.0)
MCV: 91 fL (ref 78.0–100.0)
MONO ABS: 0.7 10*3/uL (ref 0.1–1.0)
MONOS PCT: 7 % (ref 3–12)
Neutro Abs: 7.1 10*3/uL (ref 1.7–7.7)
Neutrophils Relative %: 74 % (ref 43–77)
Platelets: 107 10*3/uL — ABNORMAL LOW (ref 150–400)
RBC: 2.78 MIL/uL — AB (ref 3.87–5.11)
RDW: 14.5 % (ref 11.5–15.5)
WBC: 9.6 10*3/uL (ref 4.0–10.5)

## 2014-05-10 LAB — BASIC METABOLIC PANEL
ANION GAP: 12 (ref 5–15)
BUN: 57 mg/dL — ABNORMAL HIGH (ref 6–23)
CALCIUM: 8.4 mg/dL (ref 8.4–10.5)
CO2: 23 mmol/L (ref 19–32)
Chloride: 107 mmol/L (ref 96–112)
Creatinine, Ser: 2.66 mg/dL — ABNORMAL HIGH (ref 0.50–1.10)
GFR calc Af Amer: 19 mL/min — ABNORMAL LOW (ref >90)
GFR calc non Af Amer: 16 mL/min — ABNORMAL LOW (ref >90)
Glucose, Bld: 103 mg/dL — ABNORMAL HIGH (ref 70–99)
Potassium: 5 mmol/L (ref 3.5–5.1)
Sodium: 142 mmol/L (ref 135–145)

## 2014-05-10 LAB — PROTIME-INR
INR: 1.16 (ref 0.00–1.49)
Prothrombin Time: 15 seconds (ref 11.6–15.2)

## 2014-05-10 LAB — MRSA PCR SCREENING: MRSA BY PCR: NEGATIVE

## 2014-05-10 MED ORDER — ONDANSETRON HCL 4 MG PO TABS: 4.0000 mg | ORAL_TABLET | Freq: Four times a day (QID) | ORAL | Status: DC | PRN

## 2014-05-10 MED ORDER — SODIUM CHLORIDE 0.9 % IJ SOLN
3.0000 mL | Freq: Two times a day (BID) | INTRAMUSCULAR | Status: DC
  Administered 2014-05-11: 3 mL via INTRAVENOUS

## 2014-05-10 MED ORDER — ATORVASTATIN CALCIUM 10 MG PO TABS
20.0000 mg | ORAL_TABLET | Freq: Every day | ORAL | Status: DC
  Administered 2014-05-10 – 2014-05-12 (×2): 20 mg via ORAL
  Filled 2014-05-10: qty 1
  Filled 2014-05-10: qty 2
  Filled 2014-05-10: qty 1
  Filled 2014-05-10 (×2): qty 2

## 2014-05-10 MED ORDER — LEVETIRACETAM 250 MG PO TABS
250.0000 mg | ORAL_TABLET | Freq: Two times a day (BID) | ORAL | Status: DC
  Administered 2014-05-10 – 2014-05-12 (×3): 250 mg via ORAL
  Filled 2014-05-10 (×5): qty 1

## 2014-05-10 MED ORDER — SODIUM CHLORIDE 0.9 % IV SOLN
INTRAVENOUS | Status: DC
  Administered 2014-05-10: 11:00:00 via INTRAVENOUS

## 2014-05-10 MED ORDER — SODIUM CHLORIDE 0.9 % IV SOLN: 250.0000 mL | INTRAVENOUS | Status: DC | PRN

## 2014-05-10 MED ORDER — HYDRALAZINE HCL 50 MG PO TABS
75.0000 mg | ORAL_TABLET | Freq: Three times a day (TID) | ORAL | Status: DC
  Administered 2014-05-10 – 2014-05-11 (×2): 75 mg via ORAL
  Administered 2014-05-11: 25 mg via ORAL
  Administered 2014-05-12 – 2014-05-14 (×7): 75 mg via ORAL
  Filled 2014-05-10 (×23): qty 1

## 2014-05-10 MED ORDER — MIRTAZAPINE 15 MG PO TABS
15.0000 mg | ORAL_TABLET | Freq: Every day | ORAL | Status: DC
  Administered 2014-05-10 – 2014-05-14 (×5): 15 mg via ORAL
  Filled 2014-05-10 (×5): qty 1

## 2014-05-10 MED ORDER — SODIUM POLYSTYRENE SULFONATE 15 GM/60ML PO SUSP
15.0000 g | ORAL | Status: DC
  Filled 2014-05-10: qty 60

## 2014-05-10 MED ORDER — CLONIDINE HCL 0.1 MG PO TABS
0.1000 mg | ORAL_TABLET | Freq: Three times a day (TID) | ORAL | Status: DC
  Administered 2014-05-10 – 2014-05-14 (×9): 0.1 mg via ORAL
  Filled 2014-05-10 (×13): qty 1

## 2014-05-10 MED ORDER — TIZANIDINE HCL 4 MG PO TABS
4.0000 mg | ORAL_TABLET | Freq: Four times a day (QID) | ORAL | Status: DC | PRN
  Filled 2014-05-10: qty 1

## 2014-05-10 MED ORDER — MEGESTROL ACETATE 40 MG PO TABS
40.0000 mg | ORAL_TABLET | Freq: Two times a day (BID) | ORAL | Status: DC
  Administered 2014-05-10 – 2014-05-14 (×7): 40 mg via ORAL
  Filled 2014-05-10 (×9): qty 1

## 2014-05-10 MED ORDER — METOPROLOL SUCCINATE ER 25 MG PO TB24
50.0000 mg | ORAL_TABLET | Freq: Two times a day (BID) | ORAL | Status: DC
  Administered 2014-05-10 – 2014-05-14 (×6): 50 mg via ORAL
  Filled 2014-05-10: qty 1
  Filled 2014-05-10 (×3): qty 2
  Filled 2014-05-10: qty 1
  Filled 2014-05-10 (×2): qty 2
  Filled 2014-05-10: qty 1
  Filled 2014-05-10 (×2): qty 2

## 2014-05-10 MED ORDER — ACETAMINOPHEN 325 MG PO TABS
650.0000 mg | ORAL_TABLET | Freq: Four times a day (QID) | ORAL | Status: DC | PRN
  Administered 2014-05-14: 650 mg via ORAL
  Filled 2014-05-10: qty 2

## 2014-05-10 MED ORDER — ALUM & MAG HYDROXIDE-SIMETH 200-200-20 MG/5ML PO SUSP: 30.0000 mL | Freq: Four times a day (QID) | ORAL | Status: DC | PRN

## 2014-05-10 MED ORDER — DOCUSATE SODIUM 100 MG PO CAPS: 100.0000 mg | ORAL_CAPSULE | Freq: Every day | ORAL | Status: DC | PRN

## 2014-05-10 MED ORDER — SENNA 8.6 MG PO TABS
2.0000 | ORAL_TABLET | Freq: Two times a day (BID) | ORAL | Status: DC
Start: 2014-05-10 — End: 2014-05-14
  Administered 2014-05-12 – 2014-05-14 (×4): 17.2 mg via ORAL
  Filled 2014-05-10 (×9): qty 2

## 2014-05-10 MED ORDER — HYDROCODONE-ACETAMINOPHEN 5-325 MG PO TABS
1.0000 | ORAL_TABLET | ORAL | Status: DC | PRN
  Administered 2014-05-11 – 2014-05-12 (×2): 1 via ORAL
  Filled 2014-05-10 (×2): qty 1

## 2014-05-10 MED ORDER — MORPHINE SULFATE 2 MG/ML IJ SOLN: 2.0000 mg | INTRAMUSCULAR | Status: DC | PRN

## 2014-05-10 MED ORDER — SODIUM CHLORIDE 0.9 % IJ SOLN: 3.0000 mL | Freq: Two times a day (BID) | INTRAMUSCULAR | Status: DC

## 2014-05-10 MED ORDER — OXYBUTYNIN CHLORIDE ER 5 MG PO TB24
5.0000 mg | ORAL_TABLET | Freq: Every day | ORAL | Status: DC
  Administered 2014-05-10 – 2014-05-14 (×5): 5 mg via ORAL
  Filled 2014-05-10 (×5): qty 1

## 2014-05-10 MED ORDER — ISOSORBIDE MONONITRATE ER 30 MG PO TB24
60.0000 mg | ORAL_TABLET | Freq: Every day | ORAL | Status: DC
  Administered 2014-05-10 – 2014-05-14 (×5): 60 mg via ORAL
  Filled 2014-05-10: qty 1
  Filled 2014-05-10: qty 2
  Filled 2014-05-10: qty 1
  Filled 2014-05-10 (×2): qty 2

## 2014-05-10 MED ORDER — SODIUM CHLORIDE 0.9 % IJ SOLN
3.0000 mL | INTRAMUSCULAR | Status: DC | PRN
  Administered 2014-05-11: 3 mL via INTRAVENOUS
  Filled 2014-05-10: qty 3

## 2014-05-10 MED ORDER — ONDANSETRON HCL 4 MG/2ML IJ SOLN: 4.0000 mg | Freq: Four times a day (QID) | INTRAMUSCULAR | Status: DC | PRN

## 2014-05-10 NOTE — H&P (Signed)
[facility] History and Physical                                                                                    [name], is a 81 y.o. female  MRN: [id number]   DOB - 03-Mar-1932  Admit Date - 05/10/2014  Outpatient Primary MD for the patient is Delorse Lek, MD  With History of -  Past Medical History  Diagnosis Date  . Hypertension   . Anxiety and depression   . Chronic headaches   . Diverticular disease   . Anemia   . Arthritis     osteoarthritis  . Osteoporosis   . Vitamin B 12 deficiency   . History of ITP   . Anxiety     takes xanax for sleep  . Diabetes mellitus     NIDDM x 12 years  . Heart murmur     benign;asymptomatic  . Chronic kidney disease     ESRD - AV fistula in LUE (Dr. Carlean Jews = nephrologist)  . Left knee pain 02/08/2013  . Cachexia 02/08/2013  . Hyperlipidemia   . Tremors of nervous system   . Constipated 09/30/2013  . Left ventricular dysfunction       Past Surgical History  Procedure Laterality Date  . Av fistula placement  11/15/10    left upper arm AVF  . Abdominal hysterectomy  1973  . Joint replacement  2007    rt hip  . Eye surgery    . Cataract extraction w/ intraocular lens  implant, bilateral  2002  . Vascular surgery  10-2010    left AVF  . Hernia repair  2000    ventral  . Ventral hernia repair  08/06/2011    Procedure: HERNIA REPAIR VENTRAL ADULT;  Surgeon: Liz Malady, MD;  Location: [facility] OR;  Service: General;  Laterality: N/A;  Open Repair [employer]. Hernia with Mesh  . Cholecystectomy  yrs ago  . Total hip arthroplasty Left 04/27/2013    Procedure: LEFT TOTAL HIP ARTHROPLASTY ANTERIOR APPROACH;  Surgeon: Shelda Pal, MD;  Location: [facility] ORS;  Service: Orthopedics;  Laterality: Left;    in for   Chief Complaint  Patient presents with  . Fall     HPI  [name]  is a 81 y.o. female, with a history of HTN, DM2, osteoporosis, chronic kidney disease, chronic combined CHF presents to the ED after  a fall at 1am this morning. She reports she was trying to transfer from her wheelchair to the bathroom when she tripped over something and fell. She can not recall what she hit her head on. She complains of right hip pain and pain over her right periorbital region. Denies LOC with fall. She originally had a headache, but has since resolved. Denies nausea, vomiting, abdominal pain, numbness, tingling, chest pain, shortness of breath and vision changes. She felt "fine" for the past couple of days and reports no acute changes in health before the fall. Patient takes ASA 81 mg daily. She regularly uses snuff.   In the ED, neurosurgery and orthopedics were consulted. CT of head showed a large right frontal-temporal subdural hematoma with 5  mm right to left midline shift. There also is acute subdural blood in the interhemispheric fissure. Xray of right hip shows a nondisplaced acute fracture of the right greater trochanter.   Review of Systems   In addition to the HPI above,  Review of Systems  Constitutional: Negative for fever, weight loss and malaise/fatigue.  HENT: Negative for hearing loss.   Eyes: Negative for blurred vision and redness.  Respiratory: Negative for cough and shortness of breath.   Cardiovascular: Negative for chest pain and palpitations.  Gastrointestinal: Negative for nausea, vomiting, abdominal pain, diarrhea and constipation.  Genitourinary: Negative for dysuria.       Incontinence of urine  Musculoskeletal: Positive for joint pain (right hip) and falls. Negative for neck pain.  Neurological: Negative for tingling, sensory change, focal weakness, loss of consciousness and headaches.  Psychiatric/Behavioral: Negative for memory loss.   A full 10 point Review of Systems was done, except as stated above, all other Review of Systems were negative.  Social History History  Substance Use Topics  . Smoking status: Never Smoker   . Smokeless tobacco: Current User    Types: Snuff   . Alcohol Use: No    Family History Family History  Problem Relation Age of Onset  . Stroke Mother   . Heart disease Father     Prior to Admission medications   Medication Sig Start Date End Date Taking? Authorizing Provider  acetaminophen (TYLENOL) 325 MG tablet Take 650 mg by mouth every 6 (six) hours as needed for headache.   Yes Historical Provider, MD  aspirin 81 MG chewable tablet Chew 1 tablet (81 mg total) by mouth daily. 02/15/14  Yes Ripudeep Jenna Luo, MD  atorvastatin (LIPITOR) 20 MG tablet Take 20 mg by mouth at bedtime. daily 01/08/12  Yes Historical Provider, MD  Cholecalciferol (VITAMIN D-3) 1000 UNITS CAPS Take 1,000 Units by mouth daily.   Yes Historical Provider, MD  cloNIDine (CATAPRES) 0.1 MG tablet Take 0.1 mg by mouth 3 (three) times daily.  04/03/14  Yes Historical Provider, MD  [employer] VITAMIN B12 1000 MCG tablet TAKE 1 TABLET (1,000 MCG TOTAL) BY MOUTH DAILY. 03/30/14  Yes Artis Delay, MD  docusate sodium (COLACE) 100 MG capsule Take 100 mg by mouth daily as needed for mild constipation.   Yes Historical Provider, MD  furosemide (LASIX) 20 MG tablet Take 1 tablet (20 mg total) by mouth daily. Patient taking differently: Take 40 mg by mouth daily.  02/19/14  Yes Ripudeep Jenna Luo, MD  hydrALAZINE (APRESOLINE) 50 MG tablet Take 1.5 tablets (75 mg total) by mouth 3 (three) times daily. 03/22/14  Yes Dwana Melena, PA-C  isosorbide mononitrate (IMDUR) 60 MG 24 hr tablet Take 1 tablet (60 mg total) by mouth daily. 02/15/14  Yes Ripudeep Jenna Luo, MD  [place] 15 GM/60ML suspension Take 15 g by mouth every Monday, Wednesday, and Friday.  03/24/14  Yes Historical Provider, MD  levETIRAcetam (KEPPRA) 250 MG tablet Take 1 tablet (250 mg total) by mouth 2 (two) times daily. Please take 2 tablets oral twice daily (500 mg oral twice daily), for the next 3 days ( till 02/19/14) , then resume 1 tablet oral (250 mg) twice daily from 12/27 thereafter. Patient taking differently: Take 250 mg by mouth 2  (two) times daily.  02/17/14  Yes Starleen Arms, MD  megestrol (MEGACE) 40 MG tablet Take 40 mg by mouth 2 (two) times daily.   Yes Historical Provider, MD  metoprolol succinate (TOPROL-XL) 50  MG 24 hr tablet Take 1 tablet (50 mg total) by mouth 2 (two) times daily. Take with or immediately following a meal. 02/15/14  Yes Ripudeep K Rai, MD  mirtazapine (REMERON) 15 MG tablet Take 15 mg by mouth daily. 09/20/13  Yes Historical Provider, MD  oxybutynin (DITROPAN-XL) 5 MG 24 hr tablet Take 5 mg by mouth daily.   Yes Historical Provider, MD  senna (SENOKOT) 8.6 MG TABS tablet Take 2 tablets (17.2 mg total) by mouth 2 (two) times daily. 09/30/13  Yes Artis DelayNi Gorsuch, MD  tiZANidine (ZANAFLEX) 4 MG tablet Take 1 tablet (4 mg total) by mouth every 6 (six) hours as needed for muscle spasms. 04/29/13  Yes Lanney GinsMatthew Babish, PA-C    No Known Allergies  Physical Exam  Vitals  Blood pressure 138/64, pulse 72, temperature 98.2 F (36.8 C), temperature source Oral, resp. rate 16, height 5\' 3"  (1.6 m), weight 48.081 kg (106 lb), SpO2 100 %.   General:  lying in bed in NAD, WD, WN, appears stated age, daughter at bedside   Psych:  Normal affect and insight,  Awake Alert, Oriented X 3.  Neuro:   No F.N deficits, ALL C.Nerves Intact, Strength 5/5 all 4 extremities, Sensation intact all 4 extremities. No battle sign  ENT:  Could not assess right eye due to significant contusion/edema of periorbital area. Left eye conjunctivae clear, PERRL. Edema of right cheek/ear region. Moist oral mucosa without erythema, snuff remnants.   Neck:  Supple, No lymphadenopathy appreciated  Respiratory:  Symmetrical chest wall movement, Good air movement bilaterally, CTAB.  Cardiac:  RRR, II/VI systolic murmur, no LE edema noted, no JVD.    Abdomen:  Positive bowel sounds, Soft, Non tender, Non distended,  No masses appreciated  Skin:  No Cyanosis, Normal Skin Turgor, No Skin Rash. Ecchymosis on bilateral arms (chronic).  Significant contusion and edema of right periorbital area. Left periorbital contusion.   Extremities:  Able to move all 4. 5/5 strength in each,  no effusions.  Data Review  CBC  Recent Labs Lab 05/10/14 0519  WBC 9.6  HGB 8.1*  HCT 25.3*  PLT 107*  MCV 91.0  MCH 29.1  MCHC 32.0  RDW 14.5  LYMPHSABS 1.6  MONOABS 0.7  EOSABS 0.2  BASOSABS 0.0    Chemistries   Recent Labs Lab 05/10/14 0519  NA 142  K 5.0  CL 107  CO2 23  GLUCOSE 103*  BUN 57*  CREATININE 2.66*  CALCIUM 8.4    Coagulation profile  Recent Labs Lab 05/10/14 0519  INR 1.16    Imaging results:   Ct Head Wo Contrast  05/10/2014   CLINICAL DATA:  Larey SeatFell at home this morning  EXAM: CT HEAD WITHOUT CONTRAST  CT MAXILLOFACIAL WITHOUT CONTRAST  CT CERVICAL SPINE WITHOUT CONTRAST  TECHNIQUE: Multidetector CT imaging of the head, cervical spine, and maxillofacial structures were performed using the standard protocol without intravenous contrast. Multiplanar CT image reconstructions of the cervical spine and maxillofacial structures were also generated.  COMPARISON:  02/16/2014  FINDINGS: CT HEAD FINDINGS  There is a large right frontal and temporal subdural hematoma. Maximal thickness measures 9 mm. There is a right to left midline shift measuring 5 mm. There also is subdural blood in the interhemispheric fissure, measuring up to 10 mm thickness. There is mild mass effect on the right lateral ventricle. Basal cisterns remain patent. There is moderate generalized atrophy and periventricular hypodensity consistent with chronic microvascular ischemic disease. No calvarial or skullbase fracture is  evident. There is a large right frontal scalp hematoma.  CT MAXILLOFACIAL FINDINGS  No facial or periorbital fracture is evident. Orbital floors are intact. Zygomatic arches are intact. Pterygoid plates are intact. Mandible and TMJ intact. The large periorbital and right frontal scalp hematoma is again noted.  CT CERVICAL  SPINE FINDINGS  The cervical vertebrae are normal in height. There is kyphosis centered at C5. There are moderately severe degenerative disc changes at C5-6. The vertebral column, pedicles and facet articulations are intact, with no evidence of acute fracture. No acute soft tissue abnormality is evident.  There is degenerative central canal stenosis, greatest at C5-6 and C6-7.  IMPRESSION: 1. Large right frontal-temporal subdural hematoma with 5 mm right to left midline shift. There also is acute subdural blood in the interhemispheric fissure. 2. Moderate atrophy and chronic microvascular ischemic disease 3. Large right periorbital and right frontal scalp hematoma 4. No evidence of acute fracture involving the calvarium, skullbase, maxillofacial region or cervical spine 5. Degenerative central canal stenosis in the cervical spine at C5-6 and C6-7 6. Critical Value/emergent results were called by telephone at the time of interpretation on 05/10/2014 at 5:12 am to Dr. Marisa Severin , who verbally acknowledged these results.   Electronically Signed   By: Ellery Plunk M.D.   On: 05/10/2014 05:12   Ct Cervical Spine Wo Contrast  05/10/2014   CLINICAL DATA:  Larey Seat at home this morning  EXAM: CT HEAD WITHOUT CONTRAST  CT MAXILLOFACIAL WITHOUT CONTRAST  CT CERVICAL SPINE WITHOUT CONTRAST  TECHNIQUE: Multidetector CT imaging of the head, cervical spine, and maxillofacial structures were performed using the standard protocol without intravenous contrast. Multiplanar CT image reconstructions of the cervical spine and maxillofacial structures were also generated.  COMPARISON:  02/16/2014  FINDINGS: CT HEAD FINDINGS  There is a large right frontal and temporal subdural hematoma. Maximal thickness measures 9 mm. There is a right to left midline shift measuring 5 mm. There also is subdural blood in the interhemispheric fissure, measuring up to 10 mm thickness. There is mild mass effect on the right lateral ventricle. Basal  cisterns remain patent. There is moderate generalized atrophy and periventricular hypodensity consistent with chronic microvascular ischemic disease. No calvarial or skullbase fracture is evident. There is a large right frontal scalp hematoma.  CT MAXILLOFACIAL FINDINGS  No facial or periorbital fracture is evident. Orbital floors are intact. Zygomatic arches are intact. Pterygoid plates are intact. Mandible and TMJ intact. The large periorbital and right frontal scalp hematoma is again noted.  CT CERVICAL SPINE FINDINGS  The cervical vertebrae are normal in height. There is kyphosis centered at C5. There are moderately severe degenerative disc changes at C5-6. The vertebral column, pedicles and facet articulations are intact, with no evidence of acute fracture. No acute soft tissue abnormality is evident.  There is degenerative central canal stenosis, greatest at C5-6 and C6-7.  IMPRESSION: 1. Large right frontal-temporal subdural hematoma with 5 mm right to left midline shift. There also is acute subdural blood in the interhemispheric fissure. 2. Moderate atrophy and chronic microvascular ischemic disease 3. Large right periorbital and right frontal scalp hematoma 4. No evidence of acute fracture involving the calvarium, skullbase, maxillofacial region or cervical spine 5. Degenerative central canal stenosis in the cervical spine at C5-6 and C6-7 6. Critical Value/emergent results were called by telephone at the time of interpretation on 05/10/2014 at 5:12 am to Dr. Marisa Severin , who verbally acknowledged these results.   Electronically Signed   By:  Ellery Plunk M.D.   On: 05/10/2014 05:12   Dg Hip Unilat With Pelvis 2-3 Views Right  05/10/2014   CLINICAL DATA:  Larey Seat this morning  EXAM: RIGHT HIP (WITH PELVIS) 2-3 VIEWS  COMPARISON:  04/27/2013  FINDINGS: There is a greater trochanter fracture, nondisplaced. The total hip arthroplasty hardware appears intact. There is no evidence of dislocation.  IMPRESSION:  Acute fracture of the right greater trochanter   Electronically Signed   By: Ellery Plunk M.D.   On: 05/10/2014 06:16   Ct Maxillofacial Wo Cm  05/10/2014   CLINICAL DATA:  Larey Seat at home this morning  EXAM: CT HEAD WITHOUT CONTRAST  CT MAXILLOFACIAL WITHOUT CONTRAST  CT CERVICAL SPINE WITHOUT CONTRAST  TECHNIQUE: Multidetector CT imaging of the head, cervical spine, and maxillofacial structures were performed using the standard protocol without intravenous contrast. Multiplanar CT image reconstructions of the cervical spine and maxillofacial structures were also generated.  COMPARISON:  02/16/2014  FINDINGS: CT HEAD FINDINGS  There is a large right frontal and temporal subdural hematoma. Maximal thickness measures 9 mm. There is a right to left midline shift measuring 5 mm. There also is subdural blood in the interhemispheric fissure, measuring up to 10 mm thickness. There is mild mass effect on the right lateral ventricle. Basal cisterns remain patent. There is moderate generalized atrophy and periventricular hypodensity consistent with chronic microvascular ischemic disease. No calvarial or skullbase fracture is evident. There is a large right frontal scalp hematoma.  CT MAXILLOFACIAL FINDINGS  No facial or periorbital fracture is evident. Orbital floors are intact. Zygomatic arches are intact. Pterygoid plates are intact. Mandible and TMJ intact. The large periorbital and right frontal scalp hematoma is again noted.  CT CERVICAL SPINE FINDINGS  The cervical vertebrae are normal in height. There is kyphosis centered at C5. There are moderately severe degenerative disc changes at C5-6. The vertebral column, pedicles and facet articulations are intact, with no evidence of acute fracture. No acute soft tissue abnormality is evident.  There is degenerative central canal stenosis, greatest at C5-6 and C6-7.  IMPRESSION: 1. Large right frontal-temporal subdural hematoma with 5 mm right to left midline shift.  There also is acute subdural blood in the interhemispheric fissure. 2. Moderate atrophy and chronic microvascular ischemic disease 3. Large right periorbital and right frontal scalp hematoma 4. No evidence of acute fracture involving the calvarium, skullbase, maxillofacial region or cervical spine 5. Degenerative central canal stenosis in the cervical spine at C5-6 and C6-7 6. Critical Value/emergent results were called by telephone at the time of interpretation on 05/10/2014 at 5:12 am to Dr. Marisa Severin , who verbally acknowledged these results.   Electronically Signed   By: Ellery Plunk M.D.   On: 05/10/2014 05:12    My personal review of EKG: NSR, Anterior Q waves.   Assessment & Plan  Principal Problem:   Subdural hematoma Active Problems:   Thrombocytopenia   Nondisplaced fracture of greater trochanter of right femur   Traumatic contusion of right periorbital region   Tobacco abuse   CHF (congestive heart failure)   Essential hypertension   Acute on chronic kidney failure   Hyperlipidemia  Acute Subdural Hematoma  - CT showed right and midline subdural hematomas.  5 mm right to left midline shift.  - Neurosurgery consulted; wants a repeat head CT in the AM and will most likely not need surgery, per neurosurgery  - Neuro checks Q4 hours, pulse ox with vitals, trapeze on bed - Swallow eval  at bedside  - Vitamin D level   Acute blood loss in the setting of chronic anemia and thrombocytopenia Baseline hgb 9-10 currently 8.1. CBC in am  Acute nondisplaced fracture of greater trochanter of right femur  - Xray showed fracture after fall this morning; previous hardware intact - Ortho consulted  - Keep NPO and on bed rest until decision from ortho  - Apply ice to affected area  - Control pain (Sedation score Q4 hours due to naivety)  - Insert foley   Traumatic contusion of right periorbital region  - Large hematoma to right periorbital region; slight contusion of left  periorbital region  - Apply ice to affected area  Chronic Thrombocytopenia  - Current platelets are 107, appears to be at baseline - CBC in AM  - Transfusion of platelets if <50  Rule out Infection  - Obtaining portable CXR and UA to r/o infection   Acute on chronic kidney failure stage III - sCr is 2.66, up from baseline of 2.1 - Hold lasix at this time.  will give 50 ml/hr for 10 hours. - CMP in AM  - I&O  Chronic combined CHF  - 2D ECHO in 01/2014 showed EF of 35-40% and grade 1 diastolic dysfunction  - Does not appear fluid overloaded; hold lasix at this time due to increase in sCr - Patient is NPO will give 50 ml/hr for 10 hours.  Essential HTN  - Controlled. - Continue home meds of Clonidine .1mg  TID, hydralazine 75mg  TID, Toprol xl 50mg  QID  Constipation  - Controlled.  Continue at home medications  Hyperlipidemia  - Continue at home Lipitor   Tobacco Cessation  - SNUFF - Cessation education to be provided   DVT Prophylaxis: SCDs  AM Labs Ordered, also please review Full Orders  Family Communication: Spoke with patient and daughter    Code Status:  Full   Condition:  Stable  Time spent in minutes : 60  Jeannetta Ellis, PA-S [place], PA-C [facility] 05/10/2014, 8:30 AM After 7pm go to [email or web address] - password [email or web address]  And look for the night coverage person covering me after hours  [facility]

## 2014-05-10 NOTE — ED Notes (Addendum)
Paged Dr. Gonzella Lexhungel x2.

## 2014-05-10 NOTE — ED Provider Notes (Addendum)
CSN: [id number]     Arrival date & time 05/10/14  0319 History  This chart was scribed for Olivia Severin, MD by [name], ED Scribe. This patient was seen in room [facility] and the patient's care was started at 4:03 AM.    Chief Complaint  Patient presents with  . Fall   Patient is a 81 y.o. female presenting with fall. The history is provided by the patient and a relative. No language interpreter was used.  Fall     HPI Comments: [name] is a 81 y.o. female with past medical history of a-fib, HTN, HLD, DM, chronic kidney disease, arthritis, osteoporosis who presents to the Emergency Department complaining of fall. Patient reports that she was on her way to the bathroom, attempting to transfer to her wheelchair, and tripped and fell. She denies LOC with the fall. She currently reports pain to the right side of her face and right hip.   Patient takes 81mg  baby aspirin daily; she denies any other anticoagulants. She admits to regularly using snuff.   Past Medical History  Diagnosis Date  . Hypertension   . Anxiety and depression   . Chronic headaches   . Diverticular disease   . Anemia   . Arthritis     osteoarthritis  . Osteoporosis   . Vitamin B 12 deficiency   . History of ITP   . Anxiety     takes xanax for sleep  . Diabetes mellitus     NIDDM x 12 years  . Heart murmur     benign;asymptomatic  . Chronic kidney disease     ESRD - AV fistula in LUE (Dr. Carlean Jews = nephrologist)  . Left knee pain 02/08/2013  . Cachexia 02/08/2013  . Hyperlipidemia   . Tremors of nervous system   . Constipated 09/30/2013  . Left ventricular dysfunction    Past Surgical History  Procedure Laterality Date  . Av fistula placement  11/15/10    left upper arm AVF  . Abdominal hysterectomy  1973  . Joint replacement  2007    rt hip  . Eye surgery    . Cataract extraction w/ intraocular lens  implant, bilateral  2002  . Vascular surgery  10-2010    left AVF  . Hernia repair   2000    ventral  . Ventral hernia repair  08/06/2011    Procedure: HERNIA REPAIR VENTRAL ADULT;  Surgeon: Liz Malady, MD;  Location: [facility] OR;  Service: General;  Laterality: N/A;  Open Repair [employer]. Hernia with Mesh  . Cholecystectomy  yrs ago  . Total hip arthroplasty Left 04/27/2013    Procedure: LEFT TOTAL HIP ARTHROPLASTY ANTERIOR APPROACH;  Surgeon: Shelda Pal, MD;  Location: [facility] ORS;  Service: Orthopedics;  Laterality: Left;   Family History  Problem Relation Age of Onset  . Stroke Mother   . Heart disease Father    History  Substance Use Topics  . Smoking status: Never Smoker   . Smokeless tobacco: Current User    Types: Snuff  . Alcohol Use: No   OB History    No data available     Review of Systems  Skin: Positive for wound.  All other systems reviewed and are negative.   Allergies  Review of patient's allergies indicates no known allergies.  Home Medications   Prior to Admission medications   Medication Sig Start Date End Date Taking? Authorizing Provider  acetaminophen (TYLENOL) 325 MG tablet Take 650  mg by mouth every 6 (six) hours as needed for headache.    Historical Provider, MD  aspirin 81 MG chewable tablet Chew 1 tablet (81 mg total) by mouth daily. 02/15/14   Ripudeep Jenna Luo, MD  atorvastatin (LIPITOR) 20 MG tablet Take 20 mg by mouth at bedtime. daily 01/08/12   Historical Provider, MD  Cholecalciferol (VITAMIN D-3) 1000 UNITS CAPS Take 1,000 Units by mouth daily.    Historical Provider, MD  cloNIDine (CATAPRES) 0.1 MG tablet Take 0.1 mg by mouth 3 (three) times daily.  04/03/14   Historical Provider, MD  [employer] VITAMIN B12 1000 MCG tablet TAKE 1 TABLET (1,000 MCG TOTAL) BY MOUTH DAILY. 03/30/14   Artis Delay, MD  docusate sodium (COLACE) 100 MG capsule Take 100 mg by mouth daily as needed for mild constipation.    Historical Provider, MD  furosemide (LASIX) 20 MG tablet Take 1 tablet (20 mg total) by mouth daily. Patient taking differently: Take 40  mg by mouth daily.  02/19/14   Ripudeep Jenna Luo, MD  hydrALAZINE (APRESOLINE) 50 MG tablet Take 1.5 tablets (75 mg total) by mouth 3 (three) times daily. 03/22/14   Dwana Melena, PA-C  isosorbide mononitrate (IMDUR) 60 MG 24 hr tablet Take 1 tablet (60 mg total) by mouth daily. 02/15/14   Ripudeep Jenna Luo, MD  KIONEX 15 GM/60ML suspension Take 15 g by mouth 3 (three) times a week.  03/24/14   Historical Provider, MD  levETIRAcetam (KEPPRA) 250 MG tablet Take 1 tablet (250 mg total) by mouth 2 (two) times daily. Please take 2 tablets oral twice daily (500 mg oral twice daily), for the next 3 days ( till 02/19/14) , then resume 1 tablet oral (250 mg) twice daily from 12/27 thereafter. 02/17/14   Leana Roe Elgergawy, MD  megestrol (MEGACE) 40 MG tablet Take 40 mg by mouth 2 (two) times daily.    Historical Provider, MD  metoprolol succinate (TOPROL-XL) 50 MG 24 hr tablet Take 1 tablet (50 mg total) by mouth 2 (two) times daily. Take with or immediately following a meal. 02/15/14   Ripudeep K Rai, MD  mirtazapine (REMERON) 15 MG tablet Take 15 mg by mouth daily. 09/20/13   Historical Provider, MD  oxybutynin (DITROPAN-XL) 5 MG 24 hr tablet Take 5 mg by mouth daily.    Historical Provider, MD  senna (SENOKOT) 8.6 MG TABS tablet Take 2 tablets (17.2 mg total) by mouth 2 (two) times daily. 09/30/13   Artis Delay, MD  tiZANidine (ZANAFLEX) 4 MG tablet Take 1 tablet (4 mg total) by mouth every 6 (six) hours as needed for muscle spasms. 04/29/13   Lanney Gins, PA-C   BP 184/55 mmHg  Pulse 73  Temp(Src) 98.5 F (36.9 C) (Oral)  Resp 17  Ht 5\' 3"  (1.6 m)  Wt 106 lb (48.081 kg)  BMI 18.78 kg/m2  SpO2 98% Physical Exam  Constitutional: [name] oriented to person, place, and time. She appears well-developed and well-nourished. No distress.  HENT:  Head: Normocephalic.  Mouth/Throat: Oropharynx is clear and moist. No oropharyngeal exudate.  Moist mucous membranes. Large hematoma over right periorbital region,  swelling of nasal ridge, no blood in the nose.   Eyes:  Left eye normal, right eye unable to be visualized during exam but under ultrasound moves appropriately.   Neck: Normal range of motion. Neck supple. No JVD present.  Cardiovascular: Normal rate, regular rhythm and normal heart sounds.  Exam reveals no gallop and no friction rub.  No murmur heard. Pulmonary/Chest: Effort normal and breath sounds normal. No respiratory distress. She has no wheezes. She has no rales.  Abdominal: Soft. Bowel sounds are normal. She exhibits no mass. There is no tenderness. There is no rebound and no guarding.  Musculoskeletal: Normal range of motion. She exhibits no edema.  Moves all extremities normally.   Lymphadenopathy:    She has no cervical adenopathy.  Neurological: [name] alert and oriented to person, place, and time. She displays normal reflexes.  Skin: Skin is warm and dry. No rash noted.  Psychiatric: She has a normal mood and affect. Her behavior is normal.  Nursing note and vitals reviewed.   ED Course  Procedures   DIAGNOSTIC STUDIES: Oxygen Saturation is 98% on RA, normal by my interpretation.    COORDINATION OF CARE: 4:10 AM Discussed treatment plan with pt at bedside and pt agreed to plan.   Labs Review Labs Reviewed  BASIC METABOLIC PANEL - Abnormal; Notable for the following:    Glucose, Bld 103 (*)    BUN 57 (*)    Creatinine, Ser 2.66 (*)    GFR calc non Af Amer 16 (*)    GFR calc Af Amer 19 (*)    All other components within normal limits  CBC WITH DIFFERENTIAL/PLATELET - Abnormal; Notable for the following:    RBC 2.78 (*)    Hemoglobin 8.1 (*)    HCT 25.3 (*)    Platelets 107 (*)    All other components within normal limits  PROTIME-INR    Imaging Review Ct Head Wo Contrast  05/10/2014   CLINICAL DATA:  Larey Seat at home this morning  EXAM: CT HEAD WITHOUT CONTRAST  CT MAXILLOFACIAL WITHOUT CONTRAST  CT CERVICAL SPINE WITHOUT CONTRAST  TECHNIQUE: Multidetector CT  imaging of the head, cervical spine, and maxillofacial structures were performed using the standard protocol without intravenous contrast. Multiplanar CT image reconstructions of the cervical spine and maxillofacial structures were also generated.  COMPARISON:  02/16/2014  FINDINGS: CT HEAD FINDINGS  There is a large right frontal and temporal subdural hematoma. Maximal thickness measures 9 mm. There is a right to left midline shift measuring 5 mm. There also is subdural blood in the interhemispheric fissure, measuring up to 10 mm thickness. There is mild mass effect on the right lateral ventricle. Basal cisterns remain patent. There is moderate generalized atrophy and periventricular hypodensity consistent with chronic microvascular ischemic disease. No calvarial or skullbase fracture is evident. There is a large right frontal scalp hematoma.  CT MAXILLOFACIAL FINDINGS  No facial or periorbital fracture is evident. Orbital floors are intact. Zygomatic arches are intact. Pterygoid plates are intact. Mandible and TMJ intact. The large periorbital and right frontal scalp hematoma is again noted.  CT CERVICAL SPINE FINDINGS  The cervical vertebrae are normal in height. There is kyphosis centered at C5. There are moderately severe degenerative disc changes at C5-6. The vertebral column, pedicles and facet articulations are intact, with no evidence of acute fracture. No acute soft tissue abnormality is evident.  There is degenerative central canal stenosis, greatest at C5-6 and C6-7.  IMPRESSION: 1. Large right frontal-temporal subdural hematoma with 5 mm right to left midline shift. There also is acute subdural blood in the interhemispheric fissure. 2. Moderate atrophy and chronic microvascular ischemic disease 3. Large right periorbital and right frontal scalp hematoma 4. No evidence of acute fracture involving the calvarium, skullbase, maxillofacial region or cervical spine 5. Degenerative central canal stenosis in  the cervical  spine at C5-6 and C6-7 6. Critical Value/emergent results were called by telephone at the time of interpretation on 05/10/2014 at 5:12 am to Dr. Marisa Adkins , who verbally acknowledged these results.   Electronically Signed   By: Ellery Plunk M.D.   On: 05/10/2014 05:12   Ct Cervical Spine Wo Contrast  05/10/2014   CLINICAL DATA:  Larey Seat at home this morning  EXAM: CT HEAD WITHOUT CONTRAST  CT MAXILLOFACIAL WITHOUT CONTRAST  CT CERVICAL SPINE WITHOUT CONTRAST  TECHNIQUE: Multidetector CT imaging of the head, cervical spine, and maxillofacial structures were performed using the standard protocol without intravenous contrast. Multiplanar CT image reconstructions of the cervical spine and maxillofacial structures were also generated.  COMPARISON:  02/16/2014  FINDINGS: CT HEAD FINDINGS  There is a large right frontal and temporal subdural hematoma. Maximal thickness measures 9 mm. There is a right to left midline shift measuring 5 mm. There also is subdural blood in the interhemispheric fissure, measuring up to 10 mm thickness. There is mild mass effect on the right lateral ventricle. Basal cisterns remain patent. There is moderate generalized atrophy and periventricular hypodensity consistent with chronic microvascular ischemic disease. No calvarial or skullbase fracture is evident. There is a large right frontal scalp hematoma.  CT MAXILLOFACIAL FINDINGS  No facial or periorbital fracture is evident. Orbital floors are intact. Zygomatic arches are intact. Pterygoid plates are intact. Mandible and TMJ intact. The large periorbital and right frontal scalp hematoma is again noted.  CT CERVICAL SPINE FINDINGS  The cervical vertebrae are normal in height. There is kyphosis centered at C5. There are moderately severe degenerative disc changes at C5-6. The vertebral column, pedicles and facet articulations are intact, with no evidence of acute fracture. No acute soft tissue abnormality is evident.  There is  degenerative central canal stenosis, greatest at C5-6 and C6-7.  IMPRESSION: 1. Large right frontal-temporal subdural hematoma with 5 mm right to left midline shift. There also is acute subdural blood in the interhemispheric fissure. 2. Moderate atrophy and chronic microvascular ischemic disease 3. Large right periorbital and right frontal scalp hematoma 4. No evidence of acute fracture involving the calvarium, skullbase, maxillofacial region or cervical spine 5. Degenerative central canal stenosis in the cervical spine at C5-6 and C6-7 6. Critical Value/emergent results were called by telephone at the time of interpretation on 05/10/2014 at 5:12 am to Dr. Marisa Adkins , who verbally acknowledged these results.   Electronically Signed   By: Ellery Plunk M.D.   On: 05/10/2014 05:12   Dg Hip Unilat With Pelvis 2-3 Views Right  05/10/2014   CLINICAL DATA:  Larey Seat this morning  EXAM: RIGHT HIP (WITH PELVIS) 2-3 VIEWS  COMPARISON:  04/27/2013  FINDINGS: There is a greater trochanter fracture, nondisplaced. The total hip arthroplasty hardware appears intact. There is no evidence of dislocation.  IMPRESSION: Acute fracture of the right greater trochanter   Electronically Signed   By: Ellery Plunk M.D.   On: 05/10/2014 06:16   Ct Maxillofacial Wo Cm  05/10/2014   CLINICAL DATA:  Larey Seat at home this morning  EXAM: CT HEAD WITHOUT CONTRAST  CT MAXILLOFACIAL WITHOUT CONTRAST  CT CERVICAL SPINE WITHOUT CONTRAST  TECHNIQUE: Multidetector CT imaging of the head, cervical spine, and maxillofacial structures were performed using the standard protocol without intravenous contrast. Multiplanar CT image reconstructions of the cervical spine and maxillofacial structures were also generated.  COMPARISON:  02/16/2014  FINDINGS: CT HEAD FINDINGS  There is a large right frontal and temporal  subdural hematoma. Maximal thickness measures 9 mm. There is a right to left midline shift measuring 5 mm. There also is subdural blood in the  interhemispheric fissure, measuring up to 10 mm thickness. There is mild mass effect on the right lateral ventricle. Basal cisterns remain patent. There is moderate generalized atrophy and periventricular hypodensity consistent with chronic microvascular ischemic disease. No calvarial or skullbase fracture is evident. There is a large right frontal scalp hematoma.  CT MAXILLOFACIAL FINDINGS  No facial or periorbital fracture is evident. Orbital floors are intact. Zygomatic arches are intact. Pterygoid plates are intact. Mandible and TMJ intact. The large periorbital and right frontal scalp hematoma is again noted.  CT CERVICAL SPINE FINDINGS  The cervical vertebrae are normal in height. There is kyphosis centered at C5. There are moderately severe degenerative disc changes at C5-6. The vertebral column, pedicles and facet articulations are intact, with no evidence of acute fracture. No acute soft tissue abnormality is evident.  There is degenerative central canal stenosis, greatest at C5-6 and C6-7.  IMPRESSION: 1. Large right frontal-temporal subdural hematoma with 5 mm right to left midline shift. There also is acute subdural blood in the interhemispheric fissure. 2. Moderate atrophy and chronic microvascular ischemic disease 3. Large right periorbital and right frontal scalp hematoma 4. No evidence of acute fracture involving the calvarium, skullbase, maxillofacial region or cervical spine 5. Degenerative central canal stenosis in the cervical spine at C5-6 and C6-7 6. Critical Value/emergent results were called by telephone at the time of interpretation on 05/10/2014 at 5:12 am to Dr. Marisa SeverinLGA Cadyn Rodger , who verbally acknowledged these results.   Electronically Signed   By: Ellery Plunkaniel R Mitchell M.D.   On: 05/10/2014 05:12     EKG Interpretation   Date/Time:  Tuesday May 10 2014 03:28:49 EDT Ventricular Rate:  75 PR Interval:    QRS Duration: 99 QT Interval:  398 QTC Calculation: 444 R Axis:   -22 Text  Interpretation:  Sinus rhythm LVH with secondary repolarization  abnormality Anterior Q waves, possibly due to LVH Poor data quality  baseline artifact Reconfirmed by Alyric Parkin  MD, Tamari Redwine ([id number]) on 05/10/2014  6:20:05 AM     CRITICAL CARE Performed by: Olivia MackieTTER,Cambree Hendrix M Total critical care time: 30 min Critical care time was exclusive of separately billable procedures and treating other patients. Critical care was necessary to treat or prevent imminent or life-threatening deterioration. Critical care was time spent personally by me on the following activities: development of treatment plan with patient and/or surrogate as well as nursing, discussions with consultants, evaluation of patient's response to treatment, examination of patient, obtaining history from patient or surrogate, ordering and performing treatments and interventions, ordering and review of laboratory studies, ordering and review of radiographic studies, pulse oximetry and re-evaluation of patient's condition.  MDM   Final diagnoses:  Subdural hematoma  Periorbital hematoma of right eye  Essential hypertension  Thrombocytopenia  Greater trochanter fracture, right, closed, initial encounter   81 year old female with mechanical fall at home.  [name] awake, alert, moving all extremities.  She has chronic pain in her right hip which is slightly worse after her fall.  Patient with large periorbital hematoma to right eye.  Patient is on daily aspirin.  She has history of thrombocytopenia.  Plan for head, C-spine Max face CT scans and baseline labs.  I personally performed the services described in this documentation, which was scribed in my presence. The recorded information has been reviewed and is accurate.  5:30 AM Case discussed with Dr. Lovell Sheehan, on-call for neurosurgery.  He will see the patient in consult does not feel that she is a acute surgical candidate.  Given history of thrombocytopenia, if platelets are less than 50,000,  would recommend platelet transfusion.  We'll contact hospitalist for admission.  Patient and family updated on findings and plan.  Olivia Severin, MD 05/10/14 1610  6:34 AM Patient x-ray shows nondisplaced greater trochanter fracture.  Patient does not walk only transfers with assistance.  Will discuss with [facility] orthopedics.  Her hardware in that hip is intact.  Platelets are 107.  She does not require transfusion  Olivia Severin, MD 05/10/14 [id number]  7:22 AM [facility] will consult on patient.  Olivia Severin, MD 05/10/14 [id number]  Olivia Severin, MD 05/10/14 [phone]

## 2014-05-10 NOTE — ED Notes (Signed)
Pt c/o falling at home after tripping on the way to the bathroom. Pt presents to ED via personal vehicle with hematoma to right side of face. Pt a/o x 4 on arrival to ED.

## 2014-05-10 NOTE — ED Notes (Signed)
Car[facility] Modified Tray ordered for patient.

## 2014-05-10 NOTE — Consult Note (Signed)
Reason for Consult: Right subdural hematoma Referring Physician: Dr. Lenetta Quaker is an 81 y.o. female.  HPI: The patient is a 81 year old white female with a history of thrombocytopenia who took a fall yesterday. She was brought to [facility] emerged department and a CAT scan demonstrated a right subdural hematoma. A neurosurgical consultation has been requested. The patient has multiple medical problems.  Presently the patient is accompanied by her daughter. She complains of some right face pain and headache. She denies neck pain, back pain, numbness, tingling, weakness, etc. There was no loss of consciousness with her fall.  Past Medical History  Diagnosis Date  . Hypertension   . Anxiety and depression   . Chronic headaches   . Diverticular disease   . Anemia   . Arthritis     osteoarthritis  . Osteoporosis   . Vitamin B 12 deficiency   . History of ITP   . Anxiety     takes xanax for sleep  . Diabetes mellitus     NIDDM x 12 years  . Heart murmur     benign;asymptomatic  . Chronic kidney disease     ESRD - AV fistula in LUE (Dr. Gershon Cull = nephrologist)  . Left knee pain 02/08/2013  . Cachexia 02/08/2013  . Hyperlipidemia   . Tremors of nervous system   . Constipated 09/30/2013  . Left ventricular dysfunction     Past Surgical History  Procedure Laterality Date  . Av fistula placement  11/15/10    left upper arm AVF  . Abdominal hysterectomy  1973  . Joint replacement  2007    rt hip  . Eye surgery    . Cataract extraction w/ intraocular lens  implant, bilateral  2002  . Vascular surgery  10-2010    left AVF  . Hernia repair  2000    ventral  . Ventral hernia repair  08/06/2011    Procedure: HERNIA REPAIR VENTRAL ADULT;  Surgeon: Zenovia Jarred, MD;  Location: [facility];  Service: General;  Laterality: N/A;  Open Repair [place] with Mesh  . Cholecystectomy  yrs ago  . Total hip arthroplasty Left 04/27/2013    Procedure: LEFT TOTAL HIP  ARTHROPLASTY ANTERIOR APPROACH;  Surgeon: Mauri Pole, MD;  Location: [facility];  Service: Orthopedics;  Laterality: Left;    Family History  Problem Relation Age of Onset  . Stroke Mother   . Heart disease Father     Social History:  reports that she has never smoked. Her smokeless tobacco use includes Snuff. She reports that she does not drink alcohol or use illicit drugs.  Allergies: No Known Allergies  Medications:  I have reviewed the patient's current medications. Prior to Admission:  (Not in a hospital admission) Scheduled: Continuous: PRN: Anti-infectives    None       Results for orders placed or performed during the hospital encounter of 05/10/14 (from the past 48 hour(s))  Basic metabolic panel     Status: Abnormal   Collection Time: 05/10/14  5:19 AM  Result Value Ref Range   Sodium 142 135 - 145 mmol/L   Potassium 5.0 3.5 - 5.1 mmol/L   Chloride 107 96 - 112 mmol/L   CO2 23 19 - 32 mmol/L   Glucose, Bld 103 (H) 70 - 99 mg/dL   BUN 57 (H) 6 - 23 mg/dL   Creatinine, Ser 2.66 (H) 0.50 - 1.10 mg/dL   Calcium 8.4 8.4 - 10.5  mg/dL   GFR calc non Af Amer 16 (L) >90 mL/min   GFR calc Af Amer 19 (L) >90 mL/min    Comment: (NOTE) The eGFR has been calculated using the CKD EPI equation. This calculation has not been validated in all clinical situations. eGFR's persistently <90 mL/min signify possible Chronic Kidney Disease.    Anion gap 12 5 - 15  CBC with Differential     Status: Abnormal   Collection Time: 05/10/14  5:19 AM  Result Value Ref Range   WBC 9.6 4.0 - 10.5 K/uL   RBC 2.78 (L) 3.87 - 5.11 MIL/uL   Hemoglobin 8.1 (L) 12.0 - 15.0 g/dL   HCT 25.3 (L) 36.0 - 46.0 %   MCV 91.0 78.0 - 100.0 fL   MCH 29.1 26.0 - 34.0 pg   MCHC 32.0 30.0 - 36.0 g/dL   RDW 14.5 11.5 - 15.5 %   Platelets 107 (L) 150 - 400 K/uL    Comment: PLATELET COUNT CONFIRMED BY SMEAR REPEATED TO VERIFY SPECIMEN CHECKED FOR CLOTS    Neutrophils Relative % 74 43 - 77 %   Neutro  Abs 7.1 1.7 - 7.7 K/uL   Lymphocytes Relative 16 12 - 46 %   Lymphs Abs 1.6 0.7 - 4.0 K/uL   Monocytes Relative 7 3 - 12 %   Monocytes Absolute 0.7 0.1 - 1.0 K/uL   Eosinophils Relative 3 0 - 5 %   Eosinophils Absolute 0.2 0.0 - 0.7 K/uL   Basophils Relative 0 0 - 1 %   Basophils Absolute 0.0 0.0 - 0.1 K/uL  Protime-INR     Status: None   Collection Time: 05/10/14  5:19 AM  Result Value Ref Range   Prothrombin Time 15.0 11.6 - 15.2 seconds   INR 1.16 0.00 - 1.49    Ct Head Wo Contrast  05/10/2014   CLINICAL DATA:  [place] at home this morning  EXAM: CT HEAD WITHOUT CONTRAST  CT MAXILLOFACIAL WITHOUT CONTRAST  CT CERVICAL SPINE WITHOUT CONTRAST  TECHNIQUE: Multidetector CT imaging of the head, cervical spine, and maxillofacial structures were performed using the standard protocol without intravenous contrast. Multiplanar CT image reconstructions of the cervical spine and maxillofacial structures were also generated.  COMPARISON:  02/16/2014  FINDINGS: CT HEAD FINDINGS  There is a large right frontal and temporal subdural hematoma. Maximal thickness measures 9 mm. There is a right to left midline shift measuring 5 mm. There also is subdural blood in the interhemispheric fissure, measuring up to 10 mm thickness. There is mild mass effect on the right lateral ventricle. Basal cisterns remain patent. There is moderate generalized atrophy and periventricular hypodensity consistent with chronic microvascular ischemic disease. No calvarial or skullbase fracture is evident. There is a large right frontal scalp hematoma.  CT MAXILLOFACIAL FINDINGS  No facial or periorbital fracture is evident. Orbital floors are intact. Zygomatic arches are intact. Pterygoid plates are intact. Mandible and TMJ intact. The large periorbital and right frontal scalp hematoma is again noted.  CT CERVICAL SPINE FINDINGS  The cervical vertebrae are normal in height. There is kyphosis centered at C5. There are moderately severe  degenerative disc changes at C5-6. The vertebral column, pedicles and facet articulations are intact, with no evidence of acute fracture. No acute soft tissue abnormality is evident.  There is degenerative central canal stenosis, greatest at C5-6 and C6-7.  IMPRESSION: 1. Large right frontal-temporal subdural hematoma with 5 mm right to left midline shift. There also is acute subdural blood  in the interhemispheric fissure. 2. Moderate atrophy and chronic microvascular ischemic disease 3. Large right periorbital and right frontal scalp hematoma 4. No evidence of acute fracture involving the calvarium, skullbase, maxillofacial region or cervical spine 5. Degenerative central canal stenosis in the cervical spine at C5-6 and C6-7 6. Critical Value/emergent results were called by telephone at the time of interpretation on 05/10/2014 at 5:12 am to Dr. Linton Flemings , who verbally acknowledged these results.   Electronically Signed   By: Andreas Newport M.D.   On: 05/10/2014 05:12   Ct Cervical Spine Wo Contrast  05/10/2014   CLINICAL DATA:  [place] at home this morning  EXAM: CT HEAD WITHOUT CONTRAST  CT MAXILLOFACIAL WITHOUT CONTRAST  CT CERVICAL SPINE WITHOUT CONTRAST  TECHNIQUE: Multidetector CT imaging of the head, cervical spine, and maxillofacial structures were performed using the standard protocol without intravenous contrast. Multiplanar CT image reconstructions of the cervical spine and maxillofacial structures were also generated.  COMPARISON:  02/16/2014  FINDINGS: CT HEAD FINDINGS  There is a large right frontal and temporal subdural hematoma. Maximal thickness measures 9 mm. There is a right to left midline shift measuring 5 mm. There also is subdural blood in the interhemispheric fissure, measuring up to 10 mm thickness. There is mild mass effect on the right lateral ventricle. Basal cisterns remain patent. There is moderate generalized atrophy and periventricular hypodensity consistent with chronic  microvascular ischemic disease. No calvarial or skullbase fracture is evident. There is a large right frontal scalp hematoma.  CT MAXILLOFACIAL FINDINGS  No facial or periorbital fracture is evident. Orbital floors are intact. Zygomatic arches are intact. Pterygoid plates are intact. Mandible and TMJ intact. The large periorbital and right frontal scalp hematoma is again noted.  CT CERVICAL SPINE FINDINGS  The cervical vertebrae are normal in height. There is kyphosis centered at C5. There are moderately severe degenerative disc changes at C5-6. The vertebral column, pedicles and facet articulations are intact, with no evidence of acute fracture. No acute soft tissue abnormality is evident.  There is degenerative central canal stenosis, greatest at C5-6 and C6-7.  IMPRESSION: 1. Large right frontal-temporal subdural hematoma with 5 mm right to left midline shift. There also is acute subdural blood in the interhemispheric fissure. 2. Moderate atrophy and chronic microvascular ischemic disease 3. Large right periorbital and right frontal scalp hematoma 4. No evidence of acute fracture involving the calvarium, skullbase, maxillofacial region or cervical spine 5. Degenerative central canal stenosis in the cervical spine at C5-6 and C6-7 6. Critical Value/emergent results were called by telephone at the time of interpretation on 05/10/2014 at 5:12 am to Dr. Linton Flemings , who verbally acknowledged these results.   Electronically Signed   By: Andreas Newport M.D.   On: 05/10/2014 05:12   Dg Hip Unilat With Pelvis 2-3 Views Right  05/10/2014   CLINICAL DATA:  [place] this morning  EXAM: RIGHT HIP (WITH PELVIS) 2-3 VIEWS  COMPARISON:  04/27/2013  FINDINGS: There is a greater trochanter fracture, nondisplaced. The total hip arthroplasty hardware appears intact. There is no evidence of dislocation.  IMPRESSION: Acute fracture of the right greater trochanter   Electronically Signed   By: Andreas Newport M.D.   On: 05/10/2014  06:16   Ct Maxillofacial Wo Cm  05/10/2014   CLINICAL DATA:  [place] at home this morning  EXAM: CT HEAD WITHOUT CONTRAST  CT MAXILLOFACIAL WITHOUT CONTRAST  CT CERVICAL SPINE WITHOUT CONTRAST  TECHNIQUE: Multidetector CT imaging of the head, cervical  spine, and maxillofacial structures were performed using the standard protocol without intravenous contrast. Multiplanar CT image reconstructions of the cervical spine and maxillofacial structures were also generated.  COMPARISON:  02/16/2014  FINDINGS: CT HEAD FINDINGS  There is a large right frontal and temporal subdural hematoma. Maximal thickness measures 9 mm. There is a right to left midline shift measuring 5 mm. There also is subdural blood in the interhemispheric fissure, measuring up to 10 mm thickness. There is mild mass effect on the right lateral ventricle. Basal cisterns remain patent. There is moderate generalized atrophy and periventricular hypodensity consistent with chronic microvascular ischemic disease. No calvarial or skullbase fracture is evident. There is a large right frontal scalp hematoma.  CT MAXILLOFACIAL FINDINGS  No facial or periorbital fracture is evident. Orbital floors are intact. Zygomatic arches are intact. Pterygoid plates are intact. Mandible and TMJ intact. The large periorbital and right frontal scalp hematoma is again noted.  CT CERVICAL SPINE FINDINGS  The cervical vertebrae are normal in height. There is kyphosis centered at C5. There are moderately severe degenerative disc changes at C5-6. The vertebral column, pedicles and facet articulations are intact, with no evidence of acute fracture. No acute soft tissue abnormality is evident.  There is degenerative central canal stenosis, greatest at C5-6 and C6-7.  IMPRESSION: 1. Large right frontal-temporal subdural hematoma with 5 mm right to left midline shift. There also is acute subdural blood in the interhemispheric fissure. 2. Moderate atrophy and chronic microvascular  ischemic disease 3. Large right periorbital and right frontal scalp hematoma 4. No evidence of acute fracture involving the calvarium, skullbase, maxillofacial region or cervical spine 5. Degenerative central canal stenosis in the cervical spine at C5-6 and C6-7 6. Critical Value/emergent results were called by telephone at the time of interpretation on 05/10/2014 at 5:12 am to Dr. Linton Flemings , who verbally acknowledged these results.   Electronically Signed   By: Andreas Newport M.D.   On: 05/10/2014 05:12    ROS: As above Blood pressure 138/64, pulse 72, temperature 98.2 F (36.8 C), temperature source Oral, resp. rate 16, height $RemoveBe'5\' 3"'[id number]$  (1.6 m), weight 48.081 kg (106 lb), SpO2 100 %. Physical Exam General: An alert and pleasant 81 year old white female with right periorbital ecchymosis/swelling.  HEENT: Normocephalic, the patient has right periorbital and facial swelling and ecchymosis. Is a bit difficult to see her right pupil but her pupils appear equal and reactive. Extraocular muscles are intact. There is no evidence of hemotympanum, battle sign, raccoon's eyes, CSF otorrhea, rhinorrhea, etc.  Neck: Supple without masses, deformities, tenderness. She has an age-appropriate normal range of motion.  Thorax: Symmetric  Abdomen: Soft  Extremities: Unremarkable  Back exam: Unremarkable  Neurologic exam: The patient is alert and oriented 3. Glasgow Coma Scale 15. Radial nerves II through XII were examined bilaterally and grossly normal. Vision and hearing are grossly normal bilaterally. Motor strength is 5 over 5 in her bilateral biceps, triceps, hand grip, quadriceps, gastrocnemius, dorsi flexors. Sensory function is intact to light touch sensation all tested dermatomes bilaterally. Cerebellar function is intact to rapid alternating movements of the upper extremities bilaterally.  I have reviewed the patient's head CT performed at [facility] today. The patient has an acute right  subdural hematoma with mild midline shift.  I have also reviewed the patient's cervical CT performed at [facility] today. It demonstrates the patient has degenerative changes and cervical kyphosis. I don't see any acute findings.   Assessment/Plan: Right subdural hematoma,  thrombocytopenia: I have discussed the situation with Dr. Sharol Given, the patient, and her daughter. [name] going to be admitted to the medical service for observation. I will plan to repeat her CAT scan tomorrow. I doubt she will come to need surgery.  Artina Minella D 05/10/2014, 7:12 AM

## 2014-05-10 NOTE — Consult Note (Addendum)
Reason for Consult:  Right hip pain Referring Physician: Dr. Margit Banda is an 81 y.o. female.  HPI: 81 y/o female with PMH of dementia and DM2 fell early this morning when trying to transfer to a commode from a wheelchair.  She c/o pain in the right hip and at her head.  [name] admitted to medicine and neurosurgery is consulting for a subdural hematoma.  She c/o soreness in the right hip that is mild with rest on only moderate with any motion.  She denies numbness, tingling or weakness in the R LE.  She had a right THA by Dr. Alphonzo Cruise in the remote past and a L THA by Dr. Alvan Dame a few years ago.  She denies any difficulty with either hip prior to this injury.  She's accompanied by her daughter.  Past Medical History  Diagnosis Date  . Hypertension   . Anxiety and depression   . Chronic headaches   . Diverticular disease   . Anemia   . Arthritis     osteoarthritis  . Osteoporosis   . Vitamin B 12 deficiency   . History of ITP   . Anxiety     takes xanax for sleep  . Diabetes mellitus     NIDDM x 12 years  . Heart murmur     benign;asymptomatic  . Chronic kidney disease     ESRD - AV fistula in LUE (Dr. Gershon Cull = nephrologist)  . Left knee pain 02/08/2013  . Cachexia 02/08/2013  . Hyperlipidemia   . Tremors of nervous system   . Constipated 09/30/2013  . Left ventricular dysfunction     Past Surgical History  Procedure Laterality Date  . Av fistula placement  11/15/10    left upper arm AVF  . Abdominal hysterectomy  1973  . Joint replacement  2007    rt hip  . Eye surgery    . Cataract extraction w/ intraocular lens  implant, bilateral  2002  . Vascular surgery  10-2010    left AVF  . Hernia repair  2000    ventral  . Ventral hernia repair  08/06/2011    Procedure: HERNIA REPAIR VENTRAL ADULT;  Surgeon: Zenovia Jarred, MD;  Location: [facility];  Service: General;  Laterality: N/A;  Open Repair [place] with Mesh  . Cholecystectomy  yrs ago  .  Total hip arthroplasty Left 04/27/2013    Procedure: LEFT TOTAL HIP ARTHROPLASTY ANTERIOR APPROACH;  Surgeon: Mauri Pole, MD;  Location: [facility];  Service: Orthopedics;  Laterality: Left;    Family History  Problem Relation Age of Onset  . Stroke Mother   . Heart disease Father     Social History:  reports that she has never smoked. Her smokeless tobacco use includes Snuff. She reports that she does not drink alcohol or use illicit drugs.  Allergies: No Known Allergies  Medications: I have reviewed the patient's current medications.  Results for orders placed or performed during the hospital encounter of 05/10/14 (from the past 48 hour(s))  Basic metabolic panel     Status: Abnormal   Collection Time: 05/10/14  5:19 AM  Result Value Ref Range   Sodium 142 135 - 145 mmol/L   Potassium 5.0 3.5 - 5.1 mmol/L   Chloride 107 96 - 112 mmol/L   CO2 23 19 - 32 mmol/L   Glucose, Bld 103 (H) 70 - 99 mg/dL   BUN 57 (H) 6 - 23 mg/dL  Creatinine, Ser 2.66 (H) 0.50 - 1.10 mg/dL   Calcium 8.4 8.4 - 10.5 mg/dL   GFR calc non Af Amer 16 (L) >90 mL/min   GFR calc Af Amer 19 (L) >90 mL/min    Comment: (NOTE) The eGFR has been calculated using the CKD EPI equation. This calculation has not been validated in all clinical situations. eGFR's persistently <90 mL/min signify possible Chronic Kidney Disease.    Anion gap 12 5 - 15  CBC with Differential     Status: Abnormal   Collection Time: 05/10/14  5:19 AM  Result Value Ref Range   WBC 9.6 4.0 - 10.5 K/uL   RBC 2.78 (L) 3.87 - 5.11 MIL/uL   Hemoglobin 8.1 (L) 12.0 - 15.0 g/dL   HCT 25.3 (L) 36.0 - 46.0 %   MCV 91.0 78.0 - 100.0 fL   MCH 29.1 26.0 - 34.0 pg   MCHC 32.0 30.0 - 36.0 g/dL   RDW 14.5 11.5 - 15.5 %   Platelets 107 (L) 150 - 400 K/uL    Comment: PLATELET COUNT CONFIRMED BY SMEAR REPEATED TO VERIFY SPECIMEN CHECKED FOR CLOTS    Neutrophils Relative % 74 43 - 77 %   Neutro Abs 7.1 1.7 - 7.7 K/uL   Lymphocytes Relative 16 12  - 46 %   Lymphs Abs 1.6 0.7 - 4.0 K/uL   Monocytes Relative 7 3 - 12 %   Monocytes Absolute 0.7 0.1 - 1.0 K/uL   Eosinophils Relative 3 0 - 5 %   Eosinophils Absolute 0.2 0.0 - 0.7 K/uL   Basophils Relative 0 0 - 1 %   Basophils Absolute 0.0 0.0 - 0.1 K/uL  Protime-INR     Status: None   Collection Time: 05/10/14  5:19 AM  Result Value Ref Range   Prothrombin Time 15.0 11.6 - 15.2 seconds   INR 1.16 0.00 - 1.49  Urinalysis with microscopic     Status: Abnormal   Collection Time: 05/10/14  2:41 PM  Result Value Ref Range   Color, Urine YELLOW YELLOW   APPearance TURBID (A) CLEAR   Specific Gravity, Urine 1.013 1.005 - 1.030   pH 8.0 5.0 - 8.0   Glucose, UA NEGATIVE NEGATIVE mg/dL   Hgb urine dipstick NEGATIVE NEGATIVE   Bilirubin Urine NEGATIVE NEGATIVE   Ketones, ur NEGATIVE NEGATIVE mg/dL   Protein, ur 30 (A) NEGATIVE mg/dL   Urobilinogen, UA 1.0 0.0 - 1.0 mg/dL   Nitrite NEGATIVE NEGATIVE   Leukocytes, UA MODERATE (A) NEGATIVE   WBC, UA TOO NUMEROUS TO COUNT <3 WBC/hpf   Bacteria, UA MANY (A) RARE   Squamous Epithelial / LPF FEW (A) RARE   Casts HYALINE CASTS (A) NEGATIVE    Ct Head Wo Contrast  05/10/2014   CLINICAL DATA:  [place] at home this morning  EXAM: CT HEAD WITHOUT CONTRAST  CT MAXILLOFACIAL WITHOUT CONTRAST  CT CERVICAL SPINE WITHOUT CONTRAST  TECHNIQUE: Multidetector CT imaging of the head, cervical spine, and maxillofacial structures were performed using the standard protocol without intravenous contrast. Multiplanar CT image reconstructions of the cervical spine and maxillofacial structures were also generated.  COMPARISON:  02/16/2014  FINDINGS: CT HEAD FINDINGS  There is a large right frontal and temporal subdural hematoma. Maximal thickness measures 9 mm. There is a right to left midline shift measuring 5 mm. There also is subdural blood in the interhemispheric fissure, measuring up to 10 mm thickness. There is mild mass effect on the right lateral ventricle. Basal  cisterns remain patent. There is moderate generalized atrophy and periventricular hypodensity consistent with chronic microvascular ischemic disease. No calvarial or skullbase fracture is evident. There is a large right frontal scalp hematoma.  CT MAXILLOFACIAL FINDINGS  No facial or periorbital fracture is evident. Orbital floors are intact. Zygomatic arches are intact. Pterygoid plates are intact. Mandible and TMJ intact. The large periorbital and right frontal scalp hematoma is again noted.  CT CERVICAL SPINE FINDINGS  The cervical vertebrae are normal in height. There is kyphosis centered at C5. There are moderately severe degenerative disc changes at C5-6. The vertebral column, pedicles and facet articulations are intact, with no evidence of acute fracture. No acute soft tissue abnormality is evident.  There is degenerative central canal stenosis, greatest at C5-6 and C6-7.  IMPRESSION: 1. Large right frontal-temporal subdural hematoma with 5 mm right to left midline shift. There also is acute subdural blood in the interhemispheric fissure. 2. Moderate atrophy and chronic microvascular ischemic disease 3. Large right periorbital and right frontal scalp hematoma 4. No evidence of acute fracture involving the calvarium, skullbase, maxillofacial region or cervical spine 5. Degenerative central canal stenosis in the cervical spine at C5-6 and C6-7 6. Critical Value/emergent results were called by telephone at the time of interpretation on 05/10/2014 at 5:12 am to Dr. Linton Flemings , who verbally acknowledged these results.   Electronically Signed   By: Andreas Newport M.D.   On: 05/10/2014 05:12   Ct Cervical Spine Wo Contrast  05/10/2014   CLINICAL DATA:  [place] at home this morning  EXAM: CT HEAD WITHOUT CONTRAST  CT MAXILLOFACIAL WITHOUT CONTRAST  CT CERVICAL SPINE WITHOUT CONTRAST  TECHNIQUE: Multidetector CT imaging of the head, cervical spine, and maxillofacial structures were performed using the standard  protocol without intravenous contrast. Multiplanar CT image reconstructions of the cervical spine and maxillofacial structures were also generated.  COMPARISON:  02/16/2014  FINDINGS: CT HEAD FINDINGS  There is a large right frontal and temporal subdural hematoma. Maximal thickness measures 9 mm. There is a right to left midline shift measuring 5 mm. There also is subdural blood in the interhemispheric fissure, measuring up to 10 mm thickness. There is mild mass effect on the right lateral ventricle. Basal cisterns remain patent. There is moderate generalized atrophy and periventricular hypodensity consistent with chronic microvascular ischemic disease. No calvarial or skullbase fracture is evident. There is a large right frontal scalp hematoma.  CT MAXILLOFACIAL FINDINGS  No facial or periorbital fracture is evident. Orbital floors are intact. Zygomatic arches are intact. Pterygoid plates are intact. Mandible and TMJ intact. The large periorbital and right frontal scalp hematoma is again noted.  CT CERVICAL SPINE FINDINGS  The cervical vertebrae are normal in height. There is kyphosis centered at C5. There are moderately severe degenerative disc changes at C5-6. The vertebral column, pedicles and facet articulations are intact, with no evidence of acute fracture. No acute soft tissue abnormality is evident.  There is degenerative central canal stenosis, greatest at C5-6 and C6-7.  IMPRESSION: 1. Large right frontal-temporal subdural hematoma with 5 mm right to left midline shift. There also is acute subdural blood in the interhemispheric fissure. 2. Moderate atrophy and chronic microvascular ischemic disease 3. Large right periorbital and right frontal scalp hematoma 4. No evidence of acute fracture involving the calvarium, skullbase, maxillofacial region or cervical spine 5. Degenerative central canal stenosis in the cervical spine at C5-6 and C6-7 6. Critical Value/emergent results were called by telephone at the  time of  interpretation on 05/10/2014 at 5:12 am to Dr. Linton Flemings , who verbally acknowledged these results.   Electronically Signed   By: Andreas Newport M.D.   On: 05/10/2014 05:12   Dg Chest Port 1 View  05/10/2014   CLINICAL DATA:  [place] today in the bathroom. Facial trauma. History of hypertension, anxiety, depression, anemia, diabetes.  EXAM: PORTABLE CHEST - 1 VIEW  COMPARISON:  02/12/2014  FINDINGS: Heart is mildly enlarged. Shallow lung inflation. There are no focal consolidations or pleural effusions. No overt edema.  IMPRESSION: 1. Stable, mild cardiomegaly. 2. Shallow lung inflation.   Electronically Signed   By: Nolon Nations M.D.   On: 05/10/2014 15:46   Dg Hip Unilat With Pelvis 2-3 Views Right  05/10/2014   CLINICAL DATA:  [place] this morning  EXAM: RIGHT HIP (WITH PELVIS) 2-3 VIEWS  COMPARISON:  04/27/2013  FINDINGS: There is a greater trochanter fracture, nondisplaced. The total hip arthroplasty hardware appears intact. There is no evidence of dislocation.  IMPRESSION: Acute fracture of the right greater trochanter   Electronically Signed   By: Andreas Newport M.D.   On: 05/10/2014 06:16   Ct Maxillofacial Wo Cm  05/10/2014   CLINICAL DATA:  [place] at home this morning  EXAM: CT HEAD WITHOUT CONTRAST  CT MAXILLOFACIAL WITHOUT CONTRAST  CT CERVICAL SPINE WITHOUT CONTRAST  TECHNIQUE: Multidetector CT imaging of the head, cervical spine, and maxillofacial structures were performed using the standard protocol without intravenous contrast. Multiplanar CT image reconstructions of the cervical spine and maxillofacial structures were also generated.  COMPARISON:  02/16/2014  FINDINGS: CT HEAD FINDINGS  There is a large right frontal and temporal subdural hematoma. Maximal thickness measures 9 mm. There is a right to left midline shift measuring 5 mm. There also is subdural blood in the interhemispheric fissure, measuring up to 10 mm thickness. There is mild mass effect on the right lateral  ventricle. Basal cisterns remain patent. There is moderate generalized atrophy and periventricular hypodensity consistent with chronic microvascular ischemic disease. No calvarial or skullbase fracture is evident. There is a large right frontal scalp hematoma.  CT MAXILLOFACIAL FINDINGS  No facial or periorbital fracture is evident. Orbital floors are intact. Zygomatic arches are intact. Pterygoid plates are intact. Mandible and TMJ intact. The large periorbital and right frontal scalp hematoma is again noted.  CT CERVICAL SPINE FINDINGS  The cervical vertebrae are normal in height. There is kyphosis centered at C5. There are moderately severe degenerative disc changes at C5-6. The vertebral column, pedicles and facet articulations are intact, with no evidence of acute fracture. No acute soft tissue abnormality is evident.  There is degenerative central canal stenosis, greatest at C5-6 and C6-7.  IMPRESSION: 1. Large right frontal-temporal subdural hematoma with 5 mm right to left midline shift. There also is acute subdural blood in the interhemispheric fissure. 2. Moderate atrophy and chronic microvascular ischemic disease 3. Large right periorbital and right frontal scalp hematoma 4. No evidence of acute fracture involving the calvarium, skullbase, maxillofacial region or cervical spine 5. Degenerative central canal stenosis in the cervical spine at C5-6 and C6-7 6. Critical Value/emergent results were called by telephone at the time of interpretation on 05/10/2014 at 5:12 am to Dr. Linton Flemings , who verbally acknowledged these results.   Electronically Signed   By: Andreas Newport M.D.   On: 05/10/2014 05:12    ROS:  No recent f/c/n/v/wt loss/dizziness/falls PE:  Blood pressure 130/85, pulse 80, temperature 99.2 F (37.3 C), temperature  source Oral, resp. rate 15, height _0  (1.6 m), weight 48.081 kg (106 lb), SpO2 100 %. wn wd elderly woman in nad.  Bruising at right forehead.  A and O to person and  place.  EOMI.  resp unlabored.  R hip ttp at greater troch.  5/5 strength in flexion and abduction with out significant pain.  Skin healthy and intact.  Sens to LT intact at the R LE.  1+ dp and pt pulses.  No lymphadenopathy.  Assessment/Plan: R greater troch fracture - I believe this injury can be treated successfully in closed fashion.  The stem appears to be stable.  She can bear full weight as her pain allows.  She should start PT when cleared to do so by [facility].  She and her daughter understand this plan and agree.  She should f/u with Dr. Alvan Dame in 4 weeks in the office.  I'll sign off.  Please call with any questions.  [phone]  Wylene Simmer 05/10/2014, 6:40 PM

## 2014-05-10 NOTE — ED Notes (Signed)
Placed size 14 french foley cath into patient cloudy urine in return

## 2014-05-10 NOTE — ED Notes (Addendum)
Hold Catapres, give Metroprolol and Hydralazine per Dr. Gonzella Lexhungel for current BP. Carb Modified Diet ordered.

## 2014-05-11 ENCOUNTER — Inpatient Hospital Stay (HOSPITAL_COMMUNITY): Payer: Medicare Other

## 2014-05-11 LAB — CBC
HCT: 16 % — ABNORMAL LOW (ref 36.0–46.0)
HEMATOCRIT: 24.7 % — AB (ref 36.0–46.0)
Hemoglobin: 5.1 g/dL — CL (ref 12.0–15.0)
Hemoglobin: 8.2 g/dL — ABNORMAL LOW (ref 12.0–15.0)
MCH: 29.3 pg (ref 26.0–34.0)
MCH: 28.7 pg (ref 26.0–34.0)
MCHC: 31.9 g/dL (ref 30.0–36.0)
MCHC: 33.2 g/dL (ref 30.0–36.0)
MCV: 92 fL (ref 78.0–100.0)
MCV: 86.4 fL (ref 78.0–100.0)
PLATELETS: 80 10*3/uL — AB (ref 150–400)
PLATELETS: 70 10*3/uL — AB (ref 150–400)
RBC: 1.74 MIL/uL — ABNORMAL LOW (ref 3.87–5.11)
RBC: 2.86 MIL/uL — ABNORMAL LOW (ref 3.87–5.11)
RDW: 14.9 % (ref 11.5–15.5)
RDW: 16.3 % — AB (ref 11.5–15.5)
WBC: 5.7 10*3/uL (ref 4.0–10.5)
WBC: 7.5 10*3/uL (ref 4.0–10.5)

## 2014-05-11 LAB — COMPREHENSIVE METABOLIC PANEL
ALBUMIN: 2.5 g/dL — AB (ref 3.5–5.2)
ALK PHOS: 37 U/L — AB (ref 39–117)
ALT: 11 U/L (ref 0–35)
ANION GAP: 6 (ref 5–15)
AST: 14 U/L (ref 0–37)
BILIRUBIN TOTAL: 0.8 mg/dL (ref 0.3–1.2)
BUN: 54 mg/dL — ABNORMAL HIGH (ref 6–23)
CO2: 24 mmol/L (ref 19–32)
CREATININE: 2.73 mg/dL — AB (ref 0.50–1.10)
Calcium: 7.8 mg/dL — ABNORMAL LOW (ref 8.4–10.5)
Chloride: 108 mmol/L (ref 96–112)
GFR calc non Af Amer: 15 mL/min — ABNORMAL LOW (ref >90)
GFR, EST AFRICAN AMERICAN: 18 mL/min — AB (ref >90)
Glucose, Bld: 108 mg/dL — ABNORMAL HIGH (ref 70–99)
Potassium: 4.3 mmol/L (ref 3.5–5.1)
Sodium: 138 mmol/L (ref 135–145)
TOTAL PROTEIN: 4.6 g/dL — AB (ref 6.0–8.3)

## 2014-05-11 LAB — PREPARE RBC (CROSSMATCH)

## 2014-05-11 LAB — PROTIME-INR
INR: 1.32 (ref 0.00–1.49)
Prothrombin Time: 16.5 seconds — ABNORMAL HIGH (ref 11.6–15.2)

## 2014-05-11 MED ORDER — SODIUM POLYSTYRENE SULFONATE 15 GM/60ML PO SUSP
15.0000 g | ORAL | Status: DC
  Administered 2014-05-13: 15 g via ORAL
  Filled 2014-05-11: qty 60

## 2014-05-11 MED ORDER — SODIUM CHLORIDE 0.9 % IV SOLN
Freq: Once | INTRAVENOUS | Status: AC
  Administered 2014-05-11: 08:00:00 via INTRAVENOUS

## 2014-05-11 NOTE — Progress Notes (Signed)
[facility] TEAM 1 - Stepdown/ICU TEAM Progress Note  [name] WGN:[id number] DOB: 05/16/1932 DOA: 05/10/2014 PCP: [name],[name], MD  Admit HPI / Brief Narrative: 81 y.o. Female with a history of HTN, DM2, osteoporosis, chronic kidney disease, and chronic combined CHF who presented to the ED after a fall at 1am that morning. She reported she was trying to transfer from her wheelchair to the bathroom when she tripped over something and fell.   In the ED, Neurosurgery and Orthopedics were consulted. CT of head showed a large right frontal-temporal subdural hematoma with 5 mm right to left midline shift. There was also acute subdural blood in the interhemispheric fissure. Xray of right hip showed a nondisplaced acute fracture of the right greater trochanter.   HPI/Subjective: Pt is alert and conversant.  She c/o a modest HA and mild pain in her hip.  She denies cp, n/v, abdom pain, or sob.  [name] anxious to go home asap.    Assessment/Plan:  R subdural hematoma NS evaluated - f/u CT today suggests mild enlargement of the interhemispheric SDH, but decrease in size of the R SDH w/ no change in mass effect/shift - pt to f/u w/ NS (Dr. Lovell SheehanJenkins) in 7-10 days for a f/u outpt CT  R greater troch fracture Ortho following - to be treated in closed fashion - allowed to bear weight as pain allows - should f/u w/ Dr. Charlann Boxerlin in office in 4 weeks  Acute blood loss anemia in the setting of chronic anemia and thrombocytopenia S/p 2U PRBC - need to follow CBC to assure remains stable given profound drop to 5.1 - goal is Hgb 7.0 or >  Possible UTI UA is c/w UTI - pt is afebrile w/ normal WBC - f/u cx and withhold abx for now    CKD - currently stage 4 Not yet on HD - baseline crt reportedly ~2.1 - follow w/ transfusion/volume expansion   Traumatic contusion of right periorbital region  Appears to be slowly improving   Chronic Thrombocytopenia  Baseline appears to be ~100 - follow trend - would  transfuse in this setting if drops below 50k  Chronic systolic and diastolic CHF 2D ECHO in 01/2014 showed EF of 35-40% and grade 1 diastolic dysfunction - no evidence of volume overload at this time   HTN BP well controlled  HLD  Tobacco abuse Uses snuff  Code Status: FULL Family Communication: spoke w/ daughter and son at bedside  Disposition Plan: transfer to neuro med bed - trend Hgb - begin PT/OT- trend renal fxn  Consultants: Orthopedics NS  Procedures: none  Antibiotics: none  DVT prophylaxis: SCDs  Objective: Blood pressure 124/42, pulse 76, temperature 98.5 F (36.9 C), temperature source Axillary, resp. rate 19, height 5\' 3"  (1.6 m), weight 48.4 kg (106 lb 11.2 oz), SpO2 100 %.  Intake/Output Summary (Last 24 hours) at 05/11/14 1627 Last data filed at 05/11/14 1031  Gross per 24 hour  Intake   1845 ml  Output    275 ml  Net   1570 ml   Exam: General: No acute respiratory distress Lungs: Clear to auscultation bilaterally without wheezes or crackles Cardiovascular: Regular rate and rhythm without murmur gallop or rub normal S1 and S2 Abdomen: Nontender, nondistended, soft, bowel sounds positive, no rebound, no ascites, no appreciable mass Extremities: No significant cyanosis, clubbing, or edema bilateral lower extremities  Data Reviewed: Basic Metabolic Panel:  Recent Labs Lab 05/10/14 0519 05/11/14 0317  NA 142 138  K 5.0 4.3  CL 107 108  CO2 23 24  GLUCOSE 103* 108*  BUN 57* 54*  CREATININE 2.66* 2.73*  CALCIUM 8.4 7.8*    Liver Function Tests:  Recent Labs Lab 05/11/14 0317  AST 14  ALT 11  ALKPHOS 37*  BILITOT 0.8  PROT 4.6*  ALBUMIN 2.5*   Coags:  Recent Labs Lab 05/10/14 0519 05/11/14 0509  INR 1.16 1.32   CBC:  Recent Labs Lab 05/10/14 0519 05/11/14 0317  WBC 9.6 5.7  NEUTROABS 7.1  --   HGB 8.1* 5.1*  HCT 25.3* 16.0*  MCV 91.0 92.0  PLT 107* 80*    Recent Results (from the past 240 hour(s))  MRSA PCR  Screening     Status: None   Collection Time: 05/10/14  7:02 PM  Result Value Ref Range Status   MRSA by PCR NEGATIVE NEGATIVE Final    Comment:        The GeneXpert MRSA Assay (FDA approved for NASAL specimens only), is one component of a comprehensive MRSA colonization surveillance program. It is not intended to diagnose MRSA infection nor to guide or monitor treatment for MRSA infections.      Studies:  Recent x-ray studies have been reviewed in detail by the Attending Physician  Scheduled Meds:  Scheduled Meds: . atorvastatin  20 mg Oral QHS  . cloNIDine  0.1 mg Oral TID  . hydrALAZINE  75 mg Oral TID  . isosorbide mononitrate  60 mg Oral Daily  . levETIRAcetam  250 mg Oral BID  . megestrol  40 mg Oral BID  . metoprolol succinate  50 mg Oral BID  . mirtazapine  15 mg Oral Daily  . oxybutynin  5 mg Oral Daily  . senna  2 tablet Oral BID  . sodium chloride  3 mL Intravenous Q12H  . sodium chloride  3 mL Intravenous Q12H  . [START ON 05/13/2014] sodium polystyrene  15 g Oral Q M,W,F    Time spent on care of this patient: 35 mins   Triston Skare T , MD   [facility] Office  [phone] Pager - Text Page per Loretha Stapler as per below:  On-Call/Text Page:      [email or web address]      password [email or web address]  If 7PM-7AM, please contact night-coverage [email or web address] Password [email or web address] 05/11/2014, 4:27 PM   LOS: 1 day

## 2014-05-11 NOTE — Progress Notes (Signed)
UR COMPLETED  

## 2014-05-11 NOTE — Progress Notes (Signed)
Pt arrived from 3S reporting no pian. Will continue to monitor. Suzy Bouchardhompson, Carnelia Oscar E, RN 05/11/2014 8:00 PM

## 2014-05-11 NOTE — Progress Notes (Signed)
[facility]  Patient Status: Active (receiving services up to time of hospitalization)  [facility] is providing the following services: RN  If patient discharges after hours, please call [phone].   Kizzie FurnishDonna Fellmy 05/11/2014, 9:58 AM

## 2014-05-11 NOTE — Progress Notes (Addendum)
Shift Event: Hgb down to 5.1 from 8.1. Pt asymtpomatic, BP 109/40, 99% on RA, other VSS.   At bedside, A and O x3 Caucasian female, in NAD. ENT- R periorbital, facial swelling, and ecchymosis. lungs-CTAB. Abd- soft, ntnd.    Anemia -Order for type and screen and transfusion 2 U of PRBC.  -Scheduled repeat CT head showed overall no significant change in mass effect or midline shift   Illa LevelSahar Melquiades Kovar, [facility]

## 2014-05-11 NOTE — Progress Notes (Signed)
PT Cancellation Note  Patient Details Name: Olivia GangHelen L Adkins MRN: [id number] DOB: 12/10/1932   Cancelled Treatment:    Reason Eval/Treat Not Completed: Medical issues which prohibited therapy.  RN asks to defer today due to multiple factors including low HGB down to 5.1. 05/11/2014  [place] BingKen Vishwa Dais, PT [phone]  (pager)   Moriya Mitchell, Eliseo GumKenneth V 05/11/2014, 10:48 AM

## 2014-05-11 NOTE — Progress Notes (Signed)
Pt to TX to 4N-31, VSS, called report. Family present and notified of TX.

## 2014-05-11 NOTE — Progress Notes (Signed)
CRITICAL VALUE ALERT  Critical value received:  Hemoglobin = 5.1  Date of notification:  05/11/2014  Time of notification:  0400  Critical value read back:Yes.    Nurse who received alert:  K. Vincenza HewsUssery  MD notified (1st page):  Camila Lisman, [place]  Time of first page:  0405  MD notified (2nd page):  Time of second page:  Responding MD:  Camila Lisman, [place]  Time MD responded:  [phone]  New orders received to get a PT-INR, cycle CBCs Q4H, type and screen, and transfuse 2 units of pRBCs. Will carry out orders and continue to monitor.

## 2014-05-11 NOTE — Progress Notes (Signed)
Patient ID: [name], female   DOB: 22-Feb-1933, 81 y.o.   MRN: [id number] Subjective:  The patient is alert and pleasant and in no apparent distress. She wants to go home.  Objective: Vital signs in last 24 hours: Temp:  [98.1 F (36.7 C)-99.5 F (37.5 C)] 98.1 F (36.7 C) (03/16 1316) Pulse Rate:  [75-83] 76 (03/16 1316) Resp:  [8-20] 19 (03/16 1316) BP: (109-184)/(36-105) 124/42 mmHg (03/16 1316) SpO2:  [96 %-100 %] 100 % (03/16 1316) Weight:  [48.4 kg (106 lb 11.2 oz)] 48.4 kg (106 lb 11.2 oz) (03/15 1900)  Intake/Output from previous day: 03/15 0701 - 03/16 0700 In: 840 [P.O.:240; I.V.:600] Out: 275 [Urine:275] Intake/Output this shift: Total I/O In: 1005 [I.V.:320; Blood:685] Out: -   Physical exam the patient is alert and oriented 3, Glasgow Coma Scale 15. The patient's strength is normal in all 4 extremities. Her speech is normal. Her right eye is swollen shut.  I reviewed the patient's follow-up head CT performed this morning. There has been no significant change in the patient's small subdural hematomas. There is mild midline shift.  Lab Results:  Recent Labs  05/10/14 0519 05/11/14 0317  WBC 9.6 5.7  HGB 8.1* 5.1*  HCT 25.3* 16.0*  PLT 107* 80*   BMET  Recent Labs  05/10/14 0519 05/11/14 0317  NA 142 138  K 5.0 4.3  CL 107 108  CO2 23 24  GLUCOSE 103* 108*  BUN 57* 54*  CREATININE 2.66* 2.73*  CALCIUM 8.4 7.8*    Studies/Results: Ct Head Wo Contrast  05/11/2014   CLINICAL DATA:  Follow-up right subdural hematoma  EXAM: CT HEAD WITHOUT CONTRAST  TECHNIQUE: Contiguous axial images were obtained from the base of the skull through the vertex without intravenous contrast.  COMPARISON:  05/10/2014  FINDINGS: The right subdural hematoma appears slightly smaller than yesterday, measuring 8.2 mm in maximum thickness but extending over a smaller portion of the right cerebral surface. The inter hemispheric subdural hematoma appears a little larger, measuring  13 mm in maximum thickness in extending from anterior to posterior within the interhemispheric fissure. Also continues caudally and layers across the tentorium on the right. There is a mild degree of right to left midline shift, about 4 mm. No intraventricular hemorrhage or intraparenchymal hemorrhage is evident. Basal cisterns remain patent. Moderate atrophy and chronic white matter hypodensity is present, likely small vessel disease no acute bony abnormalities are evident. Marked right frontal and periorbital soft tissue thickening again evident.  IMPRESSION: The inter hemispheric subdural hematoma appears a little larger while the right subdural hematoma appears a little smaller. Overall there is no significant change in mass effect or midline shift.   Electronically Signed   By: Ellery Plunk M.D.   On: 05/11/2014 06:24   Ct Head Wo Contrast  05/10/2014   CLINICAL DATA:  Larey Seat at home this morning  EXAM: CT HEAD WITHOUT CONTRAST  CT MAXILLOFACIAL WITHOUT CONTRAST  CT CERVICAL SPINE WITHOUT CONTRAST  TECHNIQUE: Multidetector CT imaging of the head, cervical spine, and maxillofacial structures were performed using the standard protocol without intravenous contrast. Multiplanar CT image reconstructions of the cervical spine and maxillofacial structures were also generated.  COMPARISON:  02/16/2014  FINDINGS: CT HEAD FINDINGS  There is a large right frontal and temporal subdural hematoma. Maximal thickness measures 9 mm. There is a right to left midline shift measuring 5 mm. There also is subdural blood in the interhemispheric fissure, measuring up to 10 mm thickness. There  is mild mass effect on the right lateral ventricle. Basal cisterns remain patent. There is moderate generalized atrophy and periventricular hypodensity consistent with chronic microvascular ischemic disease. No calvarial or skullbase fracture is evident. There is a large right frontal scalp hematoma.  CT MAXILLOFACIAL FINDINGS  No facial or  periorbital fracture is evident. Orbital floors are intact. Zygomatic arches are intact. Pterygoid plates are intact. Mandible and TMJ intact. The large periorbital and right frontal scalp hematoma is again noted.  CT CERVICAL SPINE FINDINGS  The cervical vertebrae are normal in height. There is kyphosis centered at C5. There are moderately severe degenerative disc changes at C5-6. The vertebral column, pedicles and facet articulations are intact, with no evidence of acute fracture. No acute soft tissue abnormality is evident.  There is degenerative central canal stenosis, greatest at C5-6 and C6-7.  IMPRESSION: 1. Large right frontal-temporal subdural hematoma with 5 mm right to left midline shift. There also is acute subdural blood in the interhemispheric fissure. 2. Moderate atrophy and chronic microvascular ischemic disease 3. Large right periorbital and right frontal scalp hematoma 4. No evidence of acute fracture involving the calvarium, skullbase, maxillofacial region or cervical spine 5. Degenerative central canal stenosis in the cervical spine at C5-6 and C6-7 6. Critical Value/emergent results were called by telephone at the time of interpretation on 05/10/2014 at 5:12 am to Dr. Marisa SeverinLGA OTTER , who verbally acknowledged these results.   Electronically Signed   By: Ellery Plunkaniel R Mitchell M.D.   On: 05/10/2014 05:12   Ct Cervical Spine Wo Contrast  05/10/2014   CLINICAL DATA:  Larey SeatFell at home this morning  EXAM: CT HEAD WITHOUT CONTRAST  CT MAXILLOFACIAL WITHOUT CONTRAST  CT CERVICAL SPINE WITHOUT CONTRAST  TECHNIQUE: Multidetector CT imaging of the head, cervical spine, and maxillofacial structures were performed using the standard protocol without intravenous contrast. Multiplanar CT image reconstructions of the cervical spine and maxillofacial structures were also generated.  COMPARISON:  02/16/2014  FINDINGS: CT HEAD FINDINGS  There is a large right frontal and temporal subdural hematoma. Maximal thickness  measures 9 mm. There is a right to left midline shift measuring 5 mm. There also is subdural blood in the interhemispheric fissure, measuring up to 10 mm thickness. There is mild mass effect on the right lateral ventricle. Basal cisterns remain patent. There is moderate generalized atrophy and periventricular hypodensity consistent with chronic microvascular ischemic disease. No calvarial or skullbase fracture is evident. There is a large right frontal scalp hematoma.  CT MAXILLOFACIAL FINDINGS  No facial or periorbital fracture is evident. Orbital floors are intact. Zygomatic arches are intact. Pterygoid plates are intact. Mandible and TMJ intact. The large periorbital and right frontal scalp hematoma is again noted.  CT CERVICAL SPINE FINDINGS  The cervical vertebrae are normal in height. There is kyphosis centered at C5. There are moderately severe degenerative disc changes at C5-6. The vertebral column, pedicles and facet articulations are intact, with no evidence of acute fracture. No acute soft tissue abnormality is evident.  There is degenerative central canal stenosis, greatest at C5-6 and C6-7.  IMPRESSION: 1. Large right frontal-temporal subdural hematoma with 5 mm right to left midline shift. There also is acute subdural blood in the interhemispheric fissure. 2. Moderate atrophy and chronic microvascular ischemic disease 3. Large right periorbital and right frontal scalp hematoma 4. No evidence of acute fracture involving the calvarium, skullbase, maxillofacial region or cervical spine 5. Degenerative central canal stenosis in the cervical spine at C5-6 and C6-7 6. Critical  Value/emergent results were called by telephone at the time of interpretation on 05/10/2014 at 5:12 am to Dr. Marisa Severin , who verbally acknowledged these results.   Electronically Signed   By: Ellery Plunk M.D.   On: 05/10/2014 05:12   Dg Chest Port 1 View  05/10/2014   CLINICAL DATA:  Larey Seat today in the bathroom. Facial trauma.  History of hypertension, anxiety, depression, anemia, diabetes.  EXAM: PORTABLE CHEST - 1 VIEW  COMPARISON:  02/12/2014  FINDINGS: Heart is mildly enlarged. Shallow lung inflation. There are no focal consolidations or pleural effusions. No overt edema.  IMPRESSION: 1. Stable, mild cardiomegaly. 2. Shallow lung inflation.   Electronically Signed   By: Norva Pavlov M.D.   On: 05/10/2014 15:46   Dg Hip Unilat With Pelvis 2-3 Views Right  05/10/2014   CLINICAL DATA:  Larey Seat this morning  EXAM: RIGHT HIP (WITH PELVIS) 2-3 VIEWS  COMPARISON:  04/27/2013  FINDINGS: There is a greater trochanter fracture, nondisplaced. The total hip arthroplasty hardware appears intact. There is no evidence of dislocation.  IMPRESSION: Acute fracture of the right greater trochanter   Electronically Signed   By: Ellery Plunk M.D.   On: 05/10/2014 06:16   Ct Maxillofacial Wo Cm  05/10/2014   CLINICAL DATA:  Larey Seat at home this morning  EXAM: CT HEAD WITHOUT CONTRAST  CT MAXILLOFACIAL WITHOUT CONTRAST  CT CERVICAL SPINE WITHOUT CONTRAST  TECHNIQUE: Multidetector CT imaging of the head, cervical spine, and maxillofacial structures were performed using the standard protocol without intravenous contrast. Multiplanar CT image reconstructions of the cervical spine and maxillofacial structures were also generated.  COMPARISON:  02/16/2014  FINDINGS: CT HEAD FINDINGS  There is a large right frontal and temporal subdural hematoma. Maximal thickness measures 9 mm. There is a right to left midline shift measuring 5 mm. There also is subdural blood in the interhemispheric fissure, measuring up to 10 mm thickness. There is mild mass effect on the right lateral ventricle. Basal cisterns remain patent. There is moderate generalized atrophy and periventricular hypodensity consistent with chronic microvascular ischemic disease. No calvarial or skullbase fracture is evident. There is a large right frontal scalp hematoma.  CT MAXILLOFACIAL FINDINGS   No facial or periorbital fracture is evident. Orbital floors are intact. Zygomatic arches are intact. Pterygoid plates are intact. Mandible and TMJ intact. The large periorbital and right frontal scalp hematoma is again noted.  CT CERVICAL SPINE FINDINGS  The cervical vertebrae are normal in height. There is kyphosis centered at C5. There are moderately severe degenerative disc changes at C5-6. The vertebral column, pedicles and facet articulations are intact, with no evidence of acute fracture. No acute soft tissue abnormality is evident.  There is degenerative central canal stenosis, greatest at C5-6 and C6-7.  IMPRESSION: 1. Large right frontal-temporal subdural hematoma with 5 mm right to left midline shift. There also is acute subdural blood in the interhemispheric fissure. 2. Moderate atrophy and chronic microvascular ischemic disease 3. Large right periorbital and right frontal scalp hematoma 4. No evidence of acute fracture involving the calvarium, skullbase, maxillofacial region or cervical spine 5. Degenerative central canal stenosis in the cervical spine at C5-6 and C6-7 6. Critical Value/emergent results were called by telephone at the time of interpretation on 05/10/2014 at 5:12 am to Dr. Marisa Severin , who verbally acknowledged these results.   Electronically Signed   By: Ellery Plunk M.D.   On: 05/10/2014 05:12    Assessment/Plan: Right subdural hematoma: The patient is doing  well neurologically. Please have the patient follow-up with me in the office for a follow-up head CT in a week or 2. I have discussed the situation with the patient and her daughter and answered all their questions. Please call if I can be of further assistance.   LOS: 1 day     Vaneta Hammontree D 05/11/2014, 2:57 PM

## 2014-05-11 NOTE — Care Management Note (Signed)
    Page 1 of 1   05/15/2014     8:33:27 AM CARE MANAGEMENT NOTE 05/15/2014  Patient:  Dante GangDUNN,Brionne L   Account Number:  [id number]  Date Initiated:  05/11/2014  Documentation initiated by:  COLE,ANGELA  Subjective/Objective Assessment:   PTA from home with son, admitted with subdural hematoma.     Action/Plan:   Return to home when medically stable. CM to f/u with disposition.   Anticipated DC Date:  05/13/2014   Anticipated DC Plan:  HOME W HOME HEALTH SERVICES      DC Planning Services  CM consult      [facility]   Choice offered to / List presented to:  C-1 Patient        HH arranged  HH-1 RN  HH-2 PT  HH-3 OT  HH-4 NURSE'S AIDE  HH-6 SOCIAL WORKER      HH agency  [facility]   Status of service:  Completed, signed off Medicare Important Message given?  NA - LOS <3 / Initial given by admissions (If response is "NO", the following Medicare IM given date fields will be blank) Date Medicare IM given:   Medicare IM given by:   Date Additional Medicare IM given:   Additional Medicare IM given by:    Discharge Disposition:    Per UR Regulation:  Reviewed for med. necessity/level of care/duration of stay  If discussed at Long Length of Stay Meetings, dates discussed:    Comments:  05/15/14 08:00 CM spoke with RN who left message pt discharging at 17:00 3/19 and needs HHRN/PT/OT/SW/Aide [facility]  CM called [facility] rep, Judeth CornfieldStephanie to arrange these services.  No other CM needs were communicated.  Freddy JakschSarah Dezirea Mccollister, BSN, CM [phone].

## 2014-05-11 NOTE — Progress Notes (Signed)
Called MD about urinary catheter indication. MD wrote note for dayshift to assess if UA resulted in UTI. Melina Schoolshompson, Sylvan Lahm E, [place] 05/11/2014 8:33 PM

## 2014-05-11 NOTE — Clinical Documentation Improvement (Signed)
H&P currently notes "Acute on chronic kidney failure stage III".  The "Past Medical HIstory" section contains "ESRD - AV fistula in LUE".  Please clarify stage of patient's present chronic kidney disease and document in your progress note and carry over to the discharge summary.    Thank you, Doy MinceVangela Joceline Hinchcliff, RN [phone] Clinical Documentation Specialist

## 2014-05-11 NOTE — Clinical Social Work Note (Signed)
CSW Consult Acknowledged:   CSW received a consult for a fall. CSW awaiting PT/OT evaluation to determine the appropriate level of care.    Dante Cooter, MSW, LCSWA [phone]

## 2014-05-11 NOTE — Progress Notes (Deleted)
Called MD about BP low and wil lcontinue to monitor. Suzy Bouchardhompson, Elif Yonts E, [place]  05/11/2014 2000

## 2014-05-11 NOTE — Discharge Instructions (Addendum)
Subarachnoid Hemorrhage °Subarachnoid hemorrhage is bleeding in the area between the brain and the membrane that covers the brain (subarachnoid space). This increases the pressure on the brain and causes some areas of the brain to be deprived of blood flow. Subarachnoid hemorrhage is a medical emergency that may cause permanent brain damage, stroke, or even death if not treated.  °CAUSES  °· Head injury.   °· Ruptured brain aneurysm.   °· Bleeding from blood vessels that develop abnormally (arteriovenous malformation).   °· Bleeding disorder.   °· Use of blood thinners (anticoagulants). °· Use of certain drugs, such as cocaine. °For some people with subarachnoid hemorrhage, the cause is unknown.  °RISK FACTORS °· Smoking. °· Having high blood pressure (hypertension). °· Abusing alcohol. °· Being a female, especially being of post-menopausal age. °· Having a family history of disease in the blood vessels of the brain (cerebrovascular disease). °· Having certain genetic syndromes that result in kidney disease or connective tissue disease. °SIGNS AND SYMPTOMS  °· A sudden, severe headache with no known cause. The headache is often described as the worst headache ever experienced. °· Nausea or vomiting, especially when combined with other symptoms such as a headache. °· Sudden weakness or numbness of the face, arm, or leg, especially on one side of the body. °· Sudden trouble walking or difficulty moving arms or legs. °· Sudden confusion. °· Sudden personality changes. °· Trouble speaking (aphasia) or understanding. °· Difficulty swallowing. °· Sudden trouble seeing in one or both eyes. °· Double vision. °· Dizziness. °· Loss of balance or coordination. °· Intolerance to light. °· Stiff neck. °DIAGNOSIS  °Your health care provider will perform a physical exam and ask about your symptoms. If a subarachnoid hemorrhage is suspected, various tests may be ordered. These tests may include:  °· A CT scan. °· An MRI. °· A  cerebral angiogram. °· A spinal tap (lumbar puncture). °· Blood tests. °TREATMENT  °Immediate treatment in the hospital is often required to reduce the risk of brain damage. Treatment will depend on the cause of the bleeding, where it is located, and the extent of the bleeding and damage. The goals of treatment include stopping the bleeding, repairing the cause of bleeding, providing relief of symptoms, and preventing problems.  °· Medicines may be given to: °¨ Lower blood pressure (antihypertensives). °¨ Relieve pain (analgesics). °¨ Relieve nausea or vomiting. °· Surgery may also be needed to stop the bleeding, repair the cause of the bleeding, or remove the blood. °· Rehabilitation may be needed to improve any cognitive and day-to-day functions impaired by the condition. °Further treatment depends on the duration, severity, and cause of your symptoms. Physical, speech, and occupational therapists will assess you and work to improve any functions impaired by the subarachnoid hemorrhage. Measures will be taken to prevent short-term and long-term problems, including infection from breathing foreign material into the lungs (aspiration pneumonia), blood clots in the legs, bedsores, and falls. °HOME CARE INSTRUCTIONS °After your hospitalization or inpatient rehabilitation is completed and you are well enough to go home, it is important to prevent a reoccurrence. Take these steps to help prevent this: °· Take medicines only as directed by your health care provider. °· If swallow studies have determined that your swallowing reflex is present, you should eat healthy foods. A diet low in salt (sodium), saturated fat, trans fat, and cholesterol may be recommended to manage high blood pressure. Foods may need to be a special consistency (soft or pureed), or small bites may need to be   taken in order to avoid aspirating or choking. °· Rest and limit activities or movements as directed by your health care provider. °· Do not  use any tobacco products including cigarettes, chewing tobacco, or electronic cigarettes. If you need help quitting, ask your health care provider. °· Limit alcohol intake to no more than 1 drink per day for nonpregnant women and 2 drinks per day for men. One drink equals 12 ounces of beer, 5 ounces of wine, or 1½ ounces of hard liquor. °· Make any other lifestyle changes as directed by your health care provider.  °· Monitor and record your blood pressure as directed by your health care provider. °· A safe home environment is important to reduce the risk of falls. Your health care provider may arrange for specialists to evaluate your home. Having grab bars in the bedroom and bathroom is often important. Your health care provider may arrange for special equipment to be used at home, such as raised toilets and a seat for the shower. °· Physical, occupational, and speech therapy. Ongoing therapy may be needed to maximize your recovery after a subarachnoid bleed. If you have been advised to use a walker or a cane, use it at all times. Be sure to keep your therapy appointments. °· Keep all follow-up visits with your health care provider and other specialists. This includes any referrals, physical therapy, and rehabilitation. °SEEK IMMEDIATE MEDICAL CARE IF:  °· You suddenly have a sudden, severe headache with no known cause. °· You have nausea or vomiting occurring with another symptom. °· You have sudden weakness or numbness of the face, arm, or leg, especially on one side of the body. °· You have sudden trouble walking or difficulty moving arms or legs. °· You have sudden confusion. °· You have trouble speaking (aphasia) or understanding. °· You have sudden trouble seeing in one or both eyes. °· You have a sudden loss of balance or coordination. °· You have a stiff neck. °· You have difficulty breathing. °· You have a partial or total loss of consciousness. °Any of these symptoms may represent a serious problem that is  an emergency. Do not wait to see if the symptoms will go away. Get medical help right away. Call your local emergency services (911 in [place] Do not drive yourself to the hospital. °MAKE SURE YOU:  °· Understand these instructions. °· Will watch your condition. °· Will get help right away if you are not doing well or get worse. °Document Released: 12/30/2003 Document Revised: 06/28/2013 Document Reviewed: 03/27/2012 °[employer]® Patient Information ©2015 [employer]. This information is not intended to replace advice given to you by your health care provider. Make sure you discuss any questions you have with your health care provider. ° °

## 2014-05-12 DIAGNOSIS — I10 Essential (primary) hypertension: Secondary | ICD-10-CM

## 2014-05-12 DIAGNOSIS — D696 Thrombocytopenia, unspecified: Secondary | ICD-10-CM

## 2014-05-12 LAB — URINE MICROSCOPIC-ADD ON

## 2014-05-12 LAB — COMPREHENSIVE METABOLIC PANEL
ALT: 11 U/L (ref 0–35)
ANION GAP: 9 (ref 5–15)
AST: 13 U/L (ref 0–37)
Albumin: 2.8 g/dL — ABNORMAL LOW (ref 3.5–5.2)
Alkaline Phosphatase: 40 U/L (ref 39–117)
BUN: 51 mg/dL — AB (ref 6–23)
CALCIUM: 8.3 mg/dL — AB (ref 8.4–10.5)
CO2: 22 mmol/L (ref 19–32)
Chloride: 106 mmol/L (ref 96–112)
Creatinine, Ser: 2.42 mg/dL — ABNORMAL HIGH (ref 0.50–1.10)
GFR calc non Af Amer: 18 mL/min — ABNORMAL LOW (ref >90)
GFR, EST AFRICAN AMERICAN: 21 mL/min — AB (ref >90)
Glucose, Bld: 96 mg/dL (ref 70–99)
POTASSIUM: 5 mmol/L (ref 3.5–5.1)
SODIUM: 137 mmol/L (ref 135–145)
TOTAL PROTEIN: 5.4 g/dL — AB (ref 6.0–8.3)
Total Bilirubin: 0.9 mg/dL (ref 0.3–1.2)

## 2014-05-12 LAB — CBC
HCT: 24.7 % — ABNORMAL LOW (ref 36.0–46.0)
HEMOGLOBIN: 8.1 g/dL — AB (ref 12.0–15.0)
MCH: 28.5 pg (ref 26.0–34.0)
MCHC: 32.8 g/dL (ref 30.0–36.0)
MCV: 87 fL (ref 78.0–100.0)
PLATELETS: 69 10*3/uL — AB (ref 150–400)
RBC: 2.84 MIL/uL — ABNORMAL LOW (ref 3.87–5.11)
RDW: 16.2 % — ABNORMAL HIGH (ref 11.5–15.5)
WBC: 7.1 10*3/uL (ref 4.0–10.5)

## 2014-05-12 LAB — URINALYSIS, ROUTINE W REFLEX MICROSCOPIC
Bilirubin Urine: NEGATIVE
Glucose, UA: NEGATIVE mg/dL
KETONES UR: NEGATIVE mg/dL
NITRITE: POSITIVE — AB
Protein, ur: 30 mg/dL — AB
Specific Gravity, Urine: 1.013 (ref 1.005–1.030)
UROBILINOGEN UA: 0.2 mg/dL (ref 0.0–1.0)
pH: 6.5 (ref 5.0–8.0)

## 2014-05-12 LAB — VITAMIN D 25 HYDROXY (VIT D DEFICIENCY, FRACTURES): Vit D, 25-Hydroxy: 29.6 ng/mL — ABNORMAL LOW (ref 30.0–100.0)

## 2014-05-12 MED ORDER — CEFTRIAXONE SODIUM IN DEXTROSE 20 MG/ML IV SOLN
1.0000 g | INTRAVENOUS | Status: DC
Start: 2014-05-12 — End: 2014-05-14
  Administered 2014-05-12 – 2014-05-14 (×3): 1 g via INTRAVENOUS
  Filled 2014-05-12 (×3): qty 50

## 2014-05-12 MED ORDER — LEVETIRACETAM 100 MG/ML PO SOLN
250.0000 mg | Freq: Two times a day (BID) | ORAL | Status: DC
  Administered 2014-05-12 – 2014-05-14 (×4): 250 mg via ORAL
  Filled 2014-05-12 (×5): qty 2.5

## 2014-05-12 NOTE — Progress Notes (Signed)
[facility] PROGRESS NOTE  [name] ZOX:[id number] DOB: 01/12/1933 DOA: 05/10/2014 PCP: Delorse Adkins,Olivia A, MD Interim summary: 81 year old with prior h/o hypertension , diabetes Mellitus, and CKD comes in for a mechanical fall, was found to have a SDH. Neurosurgery and orthopedics were consulted , recommended outpatient follow up.   Assessment/Plan: 1. Right subdural hematoma: Initially admitted to stepdown and repeat CT shows slight decrease in the size of the SDH. No change in mass effect. Recommend outpatient follow up with Dr Lovell SheehanJenkins with a repeat CT in one week. [name] alert and oriented today.   Right trochanteric fracture: Weight bearing as tolerated. Outpatient follow u with orthopedics in 4 week.s    Urinary tract infection: pt reports some abdominal discomfort. ua repeated with urine cultures and started her on rocephin.    Stable CKD STAGE 4: improving.   Multiple contusions over the face and peri orbital areas: Monitor.    Chronic thrombocytopenia: Platelets dropped to 69,000. No signs of bleeding.    Anemia: from blood loss  S/p 2 units of prbc transfusion. Repeat hemoglobin is stable at 8. No evidence of bleeding.    Hypertension: not well controlled. Started her on IV hydralazine.   Code Status: full code.  Family Communication: daughter at bedside Disposition Plan: pending. , possibly home when counts have stabilized.    Consultants:  Neuro surgery consult  Procedures:  none  Antibiotics:  Rocephin started today.  HPI/Subjective: Denies any pain, nausea, vomiting or abdominal pain. Daughter at bedside, refusing to snf . She wants to take her home.   Objective: Filed Vitals:   05/12/14 1447  BP: 141/63  Pulse: 61  Temp: 98.2 F (36.8 C)  Resp: 18    Intake/Output Summary (Last 24 hours) at 05/12/14 1705 Last data filed at 05/12/14 1055  Gross per 24 hour  Intake      0 ml  Output   1200 ml  Net  -1200 ml   Filed Weights   05/10/14 0343 05/10/14 1900  Weight: 48.081 kg (106 lb) 48.4 kg (106 lb 11.2 oz)    Exam:   General:  Alert afebrile , ill looking lady , not in distress  Cardiovascular: s1s2,   Respiratory: clear to ausculation, no wheezing or rhonchi  Abdomen: soft non tender non distended bowel sunds heard  Musculoskeletal: trace pedal edema.   Data Reviewed: Basic Metabolic Panel:  Recent Labs Lab 05/10/14 0519 05/11/14 0317 05/12/14 0451  NA 142 138 137  K 5.0 4.3 5.0  CL 107 108 106  CO2 23 24 22   GLUCOSE 103* 108* 96  BUN 57* 54* 51*  CREATININE 2.66* 2.73* 2.42*  CALCIUM 8.4 7.8* 8.3*   Liver Function Tests:  Recent Labs Lab 05/11/14 0317 05/12/14 0451  AST 14 13  ALT 11 11  ALKPHOS 37* 40  BILITOT 0.8 0.9  PROT 4.6* 5.4*  ALBUMIN 2.5* 2.8*   No results for input(s): LIPASE, AMYLASE in the last 168 hours. No results for input(s): AMMONIA in the last 168 hours. CBC:  Recent Labs Lab 05/10/14 0519 05/11/14 0317 05/11/14 1908 05/12/14 0451  WBC 9.6 5.7 7.5 7.1  NEUTROABS 7.1  --   --   --   HGB 8.1* 5.1* 8.2* 8.1*  HCT 25.3* 16.0* 24.7* 24.7*  MCV 91.0 92.0 86.4 87.0  PLT 107* 80* 70* 69*   Cardiac Enzymes: No results for input(s): CKTOTAL, CKMB, CKMBINDEX, TROPONINI in the last 168 hours. BNP (last 3 results) No results  for input(s): BNP in the last 8760 hours.  ProBNP (last 3 results)  Recent Labs  02/12/14 0851  PROBNP 14197.0*    CBG: No results for input(s): GLUCAP in the last 168 hours.  Recent Results (from the past 240 hour(s))  MRSA PCR Screening     Status: None   Collection Time: 05/10/14  7:02 PM  Result Value Ref Range Status   MRSA by PCR NEGATIVE NEGATIVE Final    Comment:        The GeneXpert MRSA Assay (FDA approved for NASAL specimens only), is one component of a comprehensive MRSA colonization surveillance program. It is not intended to diagnose MRSA infection nor to guide or monitor treatment for MRSA infections.       Studies: Ct Head Wo Contrast  05/11/2014   CLINICAL DATA:  Follow-up right subdural hematoma  EXAM: CT HEAD WITHOUT CONTRAST  TECHNIQUE: Contiguous axial images were obtained from the base of the skull through the vertex without intravenous contrast.  COMPARISON:  05/10/2014  FINDINGS: The right subdural hematoma appears slightly smaller than yesterday, measuring 8.2 mm in maximum thickness but extending over a smaller portion of the right cerebral surface. The inter hemispheric subdural hematoma appears a little larger, measuring 13 mm in maximum thickness in extending from anterior to posterior within the interhemispheric fissure. Also continues caudally and layers across the tentorium on the right. There is a mild degree of right to left midline shift, about 4 mm. No intraventricular hemorrhage or intraparenchymal hemorrhage is evident. Basal cisterns remain patent. Moderate atrophy and chronic white matter hypodensity is present, likely small vessel disease no acute bony abnormalities are evident. Marked right frontal and periorbital soft tissue thickening again evident.  IMPRESSION: The inter hemispheric subdural hematoma appears a little larger while the right subdural hematoma appears a little smaller. Overall there is no significant change in mass effect or midline shift.   Electronically Signed   By: Ellery Plunk M.D.   On: 05/11/2014 06:24    Scheduled Meds: . atorvastatin  20 mg Oral QHS  . cefTRIAXone (ROCEPHIN)  IV  1 g Intravenous Q24H  . cloNIDine  0.1 mg Oral TID  . hydrALAZINE  75 mg Oral TID  . isosorbide mononitrate  60 mg Oral Daily  . levETIRAcetam  250 mg Oral BID  . megestrol  40 mg Oral BID  . metoprolol succinate  50 mg Oral BID  . mirtazapine  15 mg Oral Daily  . oxybutynin  5 mg Oral Daily  . senna  2 tablet Oral BID  . [START ON 05/13/2014] sodium polystyrene  15 g Oral Q M,W,F   Continuous Infusions:   Principal Problem:   Subdural hematoma Active  Problems:   Thrombocytopenia   Tobacco abuse   CHF (congestive heart failure)   Essential hypertension   Nondisplaced fracture of greater trochanter of right femur   Traumatic contusion of right periorbital region   Acute on chronic kidney failure   Hyperlipidemia    Time spent: 30 minutes.     [facility]  [facility] Pager [phone]. If 7PM-7AM, please contact night-coverage at [email or web address], password [facility] 05/12/2014, 5:05 PM  LOS: 2 days

## 2014-05-12 NOTE — Clinical Social Work Note (Signed)
CSW reviewed chart. Per PT evaluation the approriate level of care is HH PT. CSW will sign off.   Merdith Boyd, MSW, LCSWA [phone]

## 2014-05-12 NOTE — Evaluation (Signed)
Physical Therapy Evaluation Patient Details Name: [name] MRN: [id number] DOB: [birth year] Today's Date: 05/12/2014   History of Present Illness  s/p fall (pt went to bathroom unassisted after daughter put her to bed); Rt fronto-temporal SDH with 5mm shift, Rt hip fx (non-displaced with no surgery planned)  PMHx- dementia, osteoporosis, DM, anxiety, CHF  Clinical Impression  Pt admitted with above diagnosis. Pt currently with functional limitations due to the deficits listed below (see PT Problem List). Daughter wants to return pt home as she will do better cognitively at home. ? May need ambulance transport if transfers remain difficult. Pt will benefit from skilled PT to increase their independence and safety with mobility to allow discharge to the venue listed below.       Follow Up Recommendations Home health PT;Supervision/Assistance - 24 hour    Equipment Recommendations  None recommended by PT    Recommendations for Other Services       Precautions / Restrictions Precautions Precautions: Fall      Mobility  Bed Mobility Overal bed mobility: Needs Assistance Bed Mobility: Supine to Sit     Supine to sit: Mod assist;HOB elevated     General bed mobility comments: daughter assisted pt to EOB as PT prepared chair  Transfers Overall transfer level: Needs assistance Equipment used: Rolling walker (2 wheeled) Transfers: Sit to/from [employer] Sit to Stand: Min assist;+2 physical assistance;+2 safety/equipment Stand pivot transfers: Mod assist;+2 physical assistance       General transfer comment: pt able to bear weight on RLE, however tending to scoot/shuffle feet vs fully step ?due to pain  Ambulation/Gait                Mining engineer Details (indicate cue type and reason): daughter pushing pt (often does this at home)  Modified Rankin (Stroke Patients Only)       Balance Overall balance assessment: Needs assistance;History of Falls Sitting-balance support: Bilateral upper extremity supported;Feet supported Sitting balance-Leahy Scale: Poor Sitting balance - Comments: leaning to Lt (? trying to lie down) Postural control: Left lateral lean Standing balance support: Bilateral upper extremity supported Standing balance-Leahy Scale: Poor Standing balance comment: flexed torso;                              Pertinent Vitals/Pain Pain Assessment: Faces Faces Pain Scale: Hurts a little bit Pain Location: Rt hip Pain Intervention(s): Limited activity within patient's tolerance;Monitored during session;Repositioned    Home Living Family/patient expects to be discharged to:: Private residence Living Arrangements: Children (son, daughter alternate days and nights, 7 days/week) Available Help at Discharge: Family;Available 24 hours/day Type of Home: House Home Access: Ramped entrance     Home Layout: One level Home Equipment: Walker - 2 wheels;Wheelchair - [employer];Tub bench;Other (comment) (safety belt)      Prior Function Level of Independence: Needs assistance   Gait / Transfers Assistance Needed: Pt able to ambulate short distances with RW and close supervision at home. Mostly uses wheelchair to get around.   ADL's / Homemaking Assistance Needed: daughter does         Hand Dominance        Extremity/Trunk Assessment   Upper Extremity Assessment: Generalized weakness;Defer to OT evaluation           Lower Extremity Assessment: RLE deficits/detail;LLE deficits/detail RLE Deficits / Details:  AAROM supine hip flexion to 45 and pt then resists; sat EOB with hip flexion to 90; knee extension 3+ LLE Deficits / Details: AAROM WFL; strength 4/5  Cervical / Trunk Assessment: Kyphotic  Communication   Communication: Other (comment) (not HOH, however very delayed responses at times)  Cognition  Arousal/Alertness: Awake/alert Behavior During Therapy: WFL for tasks assessed/performed Overall Cognitive Status: Impaired/Different from baseline Area of Impairment: Attention;Following commands;Awareness;Problem solving   Current Attention Level: Focused (constant cues needed to stay on task) Memory: Decreased short-term memory;Decreased recall of precautions Following Commands: Follows one step commands inconsistently;Follows one step commands with increased time   Awareness:  (pre-intellectual) Problem Solving: Slow processing;Decreased initiation;Requires verbal cues;Requires tactile cues;Difficulty sequencing General Comments: long delays (verbally and motorically) with requiring repetition of commands at times; daughter reports she has periods like this at home, but this seems a bit worse    General Comments General comments (skin integrity, edema, etc.): daughter present and assisted    Exercises        Assessment/Plan    PT Assessment Patient needs continued PT services  PT Diagnosis Difficulty walking;Acute pain;Generalized weakness   PT Problem List Decreased strength;Decreased range of motion;Decreased activity tolerance;Decreased balance;Decreased mobility;Decreased cognition;Decreased knowledge of use of DME;Decreased safety awareness;Pain  PT Treatment Interventions DME instruction;Gait training;Functional mobility training;Therapeutic activities;Therapeutic exercise;Cognitive remediation;Patient/family education   PT Goals (Current goals can be found in the Care Plan section) Acute Rehab PT Goals Patient Stated Goal: daughter wants to manage pt at home PT Goal Formulation: With family Time For Goal Achievement: 05/19/14 Potential to Achieve Goals: Good    Frequency Min 4X/week   Barriers to discharge        Co-evaluation               End of Session   Activity Tolerance: Patient tolerated treatment well Patient left: in chair;with call bell/phone  within reach;with family/visitor present (no chair alarm available; RN aware & OK with dtr present) Nurse Communication: Mobility status         Time: [id number] PT Time Calculation (min) (ACUTE ONLY): 36 min   Charges:   PT Evaluation $Initial PT Evaluation Tier I: 1 Procedure PT Treatments $Therapeutic Activity: 8-22 mins   PT G Codes:        Hawken Bielby 05/12/2014, 12:28 PM Pager [phone]

## 2014-05-12 NOTE — Progress Notes (Signed)
MD made aware of patient's foley cath and also urine has a very strong odor and cloudy in color. Orders placed and carried out. Foley cath d/c'd.

## 2014-05-12 NOTE — Progress Notes (Signed)
OT Cancellation Note  Patient Details Name: [name] MRN: [id number] DOB: 02/15/1933   Cancelled Treatment:    Reason Eval/Treat Not Completed: Other (comment) Spoke to pt's daughter. Daughter does all ADLs for pt at home. Pt is able to wipe her own face and hands and participates in ADLs, but requires max Assist. Pt's daughter feels that they have a good system at home and PT will address mobility. Acute OT to sign off.   Nena JordanMiller, Vicke Plotner M   Carney LivingLeeAnn Marie French Kendra, OTR/L Occupational Therapist [phone] (pager)  05/12/2014, 5:28 PM

## 2014-05-13 LAB — CBC
HCT: 20.8 % — ABNORMAL LOW (ref 36.0–46.0)
HEMATOCRIT: 24.1 % — AB (ref 36.0–46.0)
HEMOGLOBIN: 8.2 g/dL — AB (ref 12.0–15.0)
Hemoglobin: 6.9 g/dL — CL (ref 12.0–15.0)
MCH: 28.9 pg (ref 26.0–34.0)
MCH: 29.2 pg (ref 26.0–34.0)
MCHC: 33.2 g/dL (ref 30.0–36.0)
MCHC: 34 g/dL (ref 30.0–36.0)
MCV: 87 fL (ref 78.0–100.0)
MCV: 85.8 fL (ref 78.0–100.0)
Platelets: 73 10*3/uL — ABNORMAL LOW (ref 150–400)
Platelets: 88 10*3/uL — ABNORMAL LOW (ref 150–400)
RBC: 2.39 MIL/uL — ABNORMAL LOW (ref 3.87–5.11)
RBC: 2.81 MIL/uL — AB (ref 3.87–5.11)
RDW: 15 % (ref 11.5–15.5)
RDW: 14.8 % (ref 11.5–15.5)
WBC: 6 10*3/uL (ref 4.0–10.5)
WBC: 6.8 10*3/uL (ref 4.0–10.5)

## 2014-05-13 LAB — RETICULOCYTES
RBC.: 2.81 MIL/uL — AB (ref 3.87–5.11)
RETIC CT PCT: 0.8 % (ref 0.4–3.1)
Retic Count, Absolute: 22.5 10*3/uL (ref 19.0–186.0)

## 2014-05-13 LAB — PREPARE RBC (CROSSMATCH)

## 2014-05-13 MED ORDER — HYDRALAZINE HCL 20 MG/ML IJ SOLN: 5.0000 mg | Freq: Four times a day (QID) | INTRAMUSCULAR | Status: DC | PRN

## 2014-05-13 MED ORDER — SODIUM CHLORIDE 0.9 % IV SOLN
Freq: Once | INTRAVENOUS | Status: AC
  Administered 2014-05-13: 13:00:00 via INTRAVENOUS

## 2014-05-13 NOTE — Progress Notes (Signed)
Critical hgb result of 6.9 noted. MD notified via amion and awaiting orders.

## 2014-05-13 NOTE — Progress Notes (Addendum)
[facility] PROGRESS NOTE  [name]:[id number]:[id number] DOB: 09/03/1932 DOA: 05/10/2014 PCP: [name],Olivia A, MD Interim summary: 81 year old with prior h/o hypertension , diabetes Mellitus, and CKD comes in for a mechanical fall, was found to have a SDH. Neurosurgery and orthopedics were consulted , recommended outpatient follow up.   Assessment/Plan: 1. Right subdural hematoma: Initially admitted to stepdown and repeat CT shows slight decrease in the size of the SDH. No change in mass effect. Recommend outpatient follow up with Dr [name] with a repeat CT in one week. [name] alert and oriented today.   Right trochanteric fracture: Weight bearing as tolerated. Outpatient follow u with orthopedics in 4 week.s    Urinary tract infection: pt reports some abdominal discomfort. ua repeated with urine cultures and started her on rocephin. Urine cultures are still pending.    Stable CKD STAGE 4: improving.   Multiple contusions over the face and peri orbital areas: Monitor.    Chronic thrombocytopenia: Platelets are greater than 70,000 today No signs of bleeding.    Anemia: from blood loss  S/p 2 units of prbc transfusion. Repeat hemoglobin 6.9 , currently no evidence of obvious bleeding. One more unit of prbc transfusion ordered and repeat hemoglobin in am. Anemia panel will be sent. And get stool for occult blood.   Hypertension: not well controlled. Started her on IV hydralazine.   Code Status: full code.  Family Communication: daughter at bedside Disposition Plan: pending. , possibly home when counts have stabilized.    Consultants:  Neuro surgery consult  Procedures:  none  Antibiotics:  Rocephin started today.  HPI/Subjective: Denies any pain, nausea, vomiting or abdominal pain. Some pain at the areas of contusion.   Objective: Filed Vitals:   05/13/14 1335  BP: 153/49  Pulse: 74  Temp: 99.1 F (37.3 C)  Resp: 18    Intake/Output Summary (Last 24  hours) at 05/13/14 1542 Last data filed at 05/13/14 1320  Gross per 24 hour  Intake    520 ml  Output      0 ml  Net    520 ml   Filed Weights   05/10/14 0343 05/10/14 1900  Weight: 48.081 kg (106 lb) 48.4 kg (106 lb 11.2 oz)    Exam:   General:  Alert afebrile , ill looking lady , not in distress, mutiple contusions over the face and head.   Cardiovascular: s1s2,   Respiratory: clear to ausculation, no wheezing or rhonchi  Abdomen: soft non tender non distended bowel sunds heard  Musculoskeletal: trace pedal edema.   Data Reviewed: Basic Metabolic Panel:  Recent Labs Lab 05/10/14 0519 05/11/14 0317 05/12/14 0451  NA 142 138 137  K 5.0 4.3 5.0  CL 107 108 106  CO2 23 24 22   GLUCOSE 103* 108* 96  BUN 57* 54* 51*  CREATININE 2.66* 2.73* 2.42*  CALCIUM 8.4 7.8* 8.3*   Liver Function Tests:  Recent Labs Lab 05/11/14 0317 05/12/14 0451  AST 14 13  ALT 11 11  ALKPHOS 37* 40  BILITOT 0.8 0.9  PROT 4.6* 5.4*  ALBUMIN 2.5* 2.8*   No results for input(s): LIPASE, AMYLASE in the last 168 hours. No results for input(s): AMMONIA in the last 168 hours. CBC:  Recent Labs Lab 05/10/14 0519 05/11/14 0317 05/11/14 1908 05/12/14 0451 05/13/14 1100  WBC 9.6 5.7 7.5 7.1 6.0  NEUTROABS 7.1  --   --   --   --   HGB 8.1* 5.1* 8.2* 8.1*  6.9*  HCT 25.3* 16.0* 24.7* 24.7* 20.8*  MCV 91.0 92.0 86.4 87.0 87.0  PLT 107* 80* 70* 69* 73*   Cardiac Enzymes: No results for input(s): CKTOTAL, CKMB, CKMBINDEX, TROPONINI in the last 168 hours. BNP (last 3 results) No results for input(s): BNP in the last 8760 hours.  ProBNP (last 3 results)  Recent Labs  02/12/14 0851  PROBNP 14197.0*    CBG: No results for input(s): GLUCAP in the last 168 hours.  Recent Results (from the past 240 hour(s))  MRSA PCR Screening     Status: None   Collection Time: 05/10/14  7:02 PM  Result Value Ref Range Status   MRSA by PCR NEGATIVE NEGATIVE Final    Comment:        The  GeneXpert MRSA Assay (FDA approved for NASAL specimens only), is one component of a comprehensive MRSA colonization surveillance program. It is not intended to diagnose MRSA infection nor to guide or monitor treatment for MRSA infections.      Studies: No results found.  Scheduled Meds: . atorvastatin  20 mg Oral QHS  . cefTRIAXone (ROCEPHIN)  IV  1 g Intravenous Q24H  . cloNIDine  0.1 mg Oral TID  . hydrALAZINE  75 mg Oral TID  . isosorbide mononitrate  60 mg Oral Daily  . levETIRAcetam  250 mg Oral BID  . megestrol  40 mg Oral BID  . metoprolol succinate  50 mg Oral BID  . mirtazapine  15 mg Oral Daily  . oxybutynin  5 mg Oral Daily  . senna  2 tablet Oral BID  . sodium polystyrene  15 g Oral Q M,W,F   Continuous Infusions:   Principal Problem:   Subdural hematoma Active Problems:   Thrombocytopenia   Tobacco abuse   CHF (congestive heart failure)   Essential hypertension   Nondisplaced fracture of greater trochanter of right femur   Traumatic contusion of right periorbital region   Acute on chronic kidney failure   Hyperlipidemia    Time spent: 30 minutes.     [name]  [facility] Pager [phone]. If 7PM-7AM, please contact night-coverage at [email or web address], password [facility] 05/13/2014, 3:42 PM  LOS: 3 days

## 2014-05-13 NOTE — Progress Notes (Signed)
Physical Therapy Treatment Patient Details Name: [name] MRN: [id number] DOB: 07/29/1932 Today's Date: 05/13/2014    History of Present Illness s/p fall (pt went to bathroom unassisted after daughter put her to bed); Rt fronto-temporal SDH with 5mm shift, Rt hip fx (non-displaced with no surgery planned)  PMHx- dementia, osteoporosis, DM, anxiety, CHF    PT Comments    Patient appears more alert this session and daughter stated that she was more "with it" today. We were going to attempt a couple of steps but it was unsafe as patient with decreased balance in standing. Able to SPT with Mod A. Daughter feels very safe with this technique and has assisted at home for a while now. She would like to speak with someone about ambulance transport home. Case Manager aware and stated she would inform CSW.   Follow Up Recommendations  Home health PT;Supervision/Assistance - 24 hour     Equipment Recommendations  None recommended by PT    Recommendations for Other Services       Precautions / Restrictions Precautions Precautions: Fall    Mobility  Bed Mobility Overal bed mobility: Needs Assistance Bed Mobility: Supine to Sit     Supine to sit: Max assist     General bed mobility comments: Max assist to elevate shoulders/trunk up into sitting and assist to initaite LEs out of bed. Cues for positioning throughout   Transfers Overall transfer level: Needs assistance Equipment used: Rolling walker (2 wheeled)   Sit to Stand: Mod assist Stand pivot transfers: Mod assist       General transfer comment: Mod A to transfer from bed to recliner. Patient able to bear weight through LEs and takes small shuffling pivot steps over to recliner. Utilized bear hug technique as this is method daughter utilizes at home.   Ambulation/Gait             General Gait Details: unsafe to attempt   Mining engineertairs            Wheelchair Mobility Wheelchair Mobility Wheelchair Assistance Details  (indicate cue type and reason): daughter pushing pt (often does this at home)  Modified Rankin (Stroke Patients Only)       Balance       Sitting balance - Comments: required UE assist. No leaning noted this session     Standing balance-Leahy Scale: Poor                      Cognition Arousal/Alertness: Awake/alert Behavior During Therapy: WFL for tasks assessed/performed Overall Cognitive Status: Impaired/Different from baseline Area of Impairment: Memory;Following commands;Attention   Current Attention Level: Focused Memory: Decreased short-term memory;Decreased recall of precautions Following Commands: Follows one step commands inconsistently;Follows one step commands with increased time     Problem Solving: Slow processing;Decreased initiation;Difficulty sequencing;Requires verbal cues General Comments: Continues to require cues to stay on task but appears more alert than reported yesterday but still required lots of cues.     Exercises General Exercises - Lower Extremity Long Arc Quad: AAROM;Both;5 reps Hip Flexion/Marching: AAROM;Both;5 reps    General Comments        Pertinent Vitals/Pain Pain Assessment: No/denies pain    Home Living                      Prior Function            PT Goals (current goals can now be found in the care plan section) Progress towards PT  goals: Progressing toward goals    Frequency  Min 4X/week    PT Plan Current plan remains appropriate    Co-evaluation             End of Session Equipment Utilized During Treatment: Gait belt Activity Tolerance: Patient tolerated treatment well Patient left: in chair;with call bell/phone within reach     Time: 1610-9604 PT Time Calculation (min) (ACUTE ONLY): 17 min  Charges:  $Therapeutic Activity: 8-22 mins                    G Codes:      Fredrich Birks 05/13/2014, 10:42 AM 05/13/2014 Fredrich Birks PTA [phone]  pager [phone] office

## 2014-05-14 LAB — TYPE AND SCREEN
ABO/RH(D): O POS
ANTIBODY SCREEN: NEGATIVE
UNIT DIVISION: 0
UNIT DIVISION: 0
UNIT DIVISION: 0

## 2014-05-14 LAB — IRON AND TIBC
IRON: 22 ug/dL — AB (ref 42–145)
Saturation Ratios: 15 % — ABNORMAL LOW (ref 20–55)
TIBC: 145 ug/dL — ABNORMAL LOW (ref 250–470)
UIBC: 123 ug/dL — ABNORMAL LOW (ref 125–400)

## 2014-05-14 LAB — FERRITIN: FERRITIN: 1119 ng/mL — AB (ref 10–291)

## 2014-05-14 LAB — BASIC METABOLIC PANEL
Anion gap: 7 (ref 5–15)
BUN: 40 mg/dL — ABNORMAL HIGH (ref 6–23)
CHLORIDE: 105 mmol/L (ref 96–112)
CO2: 22 mmol/L (ref 19–32)
Calcium: 8 mg/dL — ABNORMAL LOW (ref 8.4–10.5)
Creatinine, Ser: 1.93 mg/dL — ABNORMAL HIGH (ref 0.50–1.10)
GFR calc non Af Amer: 24 mL/min — ABNORMAL LOW (ref >90)
GFR, EST AFRICAN AMERICAN: 27 mL/min — AB (ref >90)
Glucose, Bld: 84 mg/dL (ref 70–99)
Potassium: 4.4 mmol/L (ref 3.5–5.1)
Sodium: 134 mmol/L — ABNORMAL LOW (ref 135–145)

## 2014-05-14 LAB — CBC
HEMATOCRIT: 22.4 % — AB (ref 36.0–46.0)
Hemoglobin: 7.6 g/dL — ABNORMAL LOW (ref 12.0–15.0)
MCH: 29.5 pg (ref 26.0–34.0)
MCHC: 33.9 g/dL (ref 30.0–36.0)
MCV: 86.8 fL (ref 78.0–100.0)
Platelets: 86 10*3/uL — ABNORMAL LOW (ref 150–400)
RBC: 2.58 MIL/uL — ABNORMAL LOW (ref 3.87–5.11)
RDW: 14.8 % (ref 11.5–15.5)
WBC: 5.7 10*3/uL (ref 4.0–10.5)

## 2014-05-14 LAB — OCCULT BLOOD X 1 CARD TO LAB, STOOL: Fecal Occult Bld: NEGATIVE

## 2014-05-14 LAB — FOLATE: Folate: 9.2 ng/mL

## 2014-05-14 LAB — VITAMIN B12: Vitamin B-12: 1052 pg/mL — ABNORMAL HIGH (ref 211–911)

## 2014-05-14 MED ORDER — POLYETHYLENE GLYCOL 3350 17 G PO PACK
17.0000 g | PACK | Freq: Every day | ORAL | Status: DC
  Administered 2014-05-14: 17 g via ORAL
  Filled 2014-05-14: qty 1

## 2014-05-14 MED ORDER — CEPHALEXIN 500 MG PO CAPS: 500.0000 mg | ORAL_CAPSULE | Freq: Two times a day (BID) | ORAL | Status: AC

## 2014-05-14 MED ORDER — BISACODYL 10 MG RE SUPP
10.0000 mg | Freq: Once | RECTAL | Status: AC
  Administered 2014-05-14: 10 mg via RECTAL
  Filled 2014-05-14: qty 1

## 2014-05-14 NOTE — Progress Notes (Signed)
Pt d/c to home by car with family. Assessment stable. Prescriptions given. Family verbalizes understanding of d/c instructions. 

## 2014-05-14 NOTE — Discharge Summary (Signed)
Physician Discharge Summary  [name] ZOX:[id number] DOB: 1933/01/11 DOA: 05/10/2014  PCP: Delorse Lek, MD  Admit date: 05/10/2014 Discharge date: 05/14/2014  Time spent: 30 minutes  Recommendations for Outpatient Follow-up:  1. FOLLOW up with PCP in 2 days and check cbc on Monday and fax the reports to the PCP.  2. Follow up with Dr Lovell Sheehan in one week and obtain a CT head without contrast to follow up the SDH.  3. Followup with orthopedics as recommended.  4. Please get CBC checked on Monday.  5. You have been recommended to be discharged to SNF,but since you refused, follow with home health PT, OT, RN and home health aid.   Discharge Diagnoses:  Principal Problem:   Subdural hematoma Active Problems:   Thrombocytopenia   Tobacco abuse   CHF (congestive heart failure)   Essential hypertension   Nondisplaced fracture of greater trochanter of right femur   Traumatic contusion of right periorbital region   Acute on chronic kidney failure   Hyperlipidemia   Discharge Condition:  improved  Diet recommendation: regular.   Filed Weights   05/10/14 0343 05/10/14 1900  Weight: 48.081 kg (106 lb) 48.4 kg (106 lb 11.2 oz)    History of present illness:  81 year old with prior h/o hypertension , diabetes Mellitus, and CKD comes in for a mechanical fall, was found to have a SDH. Neurosurgery and orthopedics were consulted , recommended outpatient follow up  Hospital Course:  1. Right subdural hematoma: Initially admitted to stepdown and repeat CT shows slight decrease in the size of the SDH. No change in mass effect. Recommend outpatient follow up with Dr Lovell Sheehan with a repeat CT in one week. [name] alert and oriented today.   Right trochanteric fracture: Weight bearing as tolerated. Outpatient follow up with orthopedics in 4 week.s    Urinary tract infection: pt reports some abdominal discomfort. ua repeated with urine cultures and started her on rocephin. Discharged on  oral antibiotic to complete the course.   Stable CKD STAGE 4: improving.   Multiple contusions over the face and peri orbital areas: Monitor.    Chronic thrombocytopenia: Platelets are greater than 70,000 today No signs of bleeding.    Anemia: from blood loss  S/p 2 units of prbc transfusion. Repeat hemoglobin 6.9 , currently no evidence of obvious bleeding. One more unit of prbc transfusion ordered and repeat hemoglobin in am is stable around 7. Stool for occult blood is negative.    Hypertension: better controlled on discharge.   Procedures:  none  Consultations:  Neuro surgery consult.   Discharge Exam: Filed Vitals:   05/14/14 1427  BP: 138/62  Pulse: 72  Temp: 98.3 F (36.8 C)  Resp: 18    General: alert afebrile comfortable Cardiovascular: s1s2 Respiratory: ctab  Discharge Instructions   Discharge Instructions    Diet - low sodium heart healthy    Complete by:  As directed      Discharge instructions    Complete by:  As directed   Please follow up with Neuro surgery Dr Lovell Sheehan in one week  as recommended and get a repeat CT head .  Please check CBC by home health RN on Monday and fax the results to the PCP.  Please get repeat CT head in one week.          Current Discharge Medication List    CONTINUE these medications which have NOT CHANGED   Details  acetaminophen (TYLENOL) 325 MG tablet  Take 650 mg by mouth every 6 (six) hours as needed for headache.    atorvastatin (LIPITOR) 20 MG tablet Take 20 mg by mouth at bedtime. daily   Associated Diagnoses: Thrombocytopenia; Anemia of renal disease; Vitamin B12 deficiency anemia    Cholecalciferol (VITAMIN D-3) 1000 UNITS CAPS Take 1,000 Units by mouth daily.    cloNIDine (CATAPRES) 0.1 MG tablet Take 0.1 mg by mouth 3 (three) times daily.     [facility] VITAMIN B12 1000 MCG tablet TAKE 1 TABLET (1,000 MCG TOTAL) BY MOUTH DAILY. Qty: 30 tablet, Refills: 6    docusate sodium (COLACE) 100 MG capsule Take  100 mg by mouth daily as needed for mild constipation.    hydrALAZINE (APRESOLINE) 50 MG tablet Take 1.5 tablets (75 mg total) by mouth 3 (three) times daily. Qty: 90 tablet, Refills: 3    isosorbide mononitrate (IMDUR) 60 MG 24 hr tablet Take 1 tablet (60 mg total) by mouth daily. Qty: 30 tablet, Refills: 3    KIONEX 15 GM/60ML suspension Take 15 g by mouth every Monday, Wednesday, and Friday.  Refills: 5    levETIRAcetam (KEPPRA) 250 MG tablet Take 1 tablet (250 mg total) by mouth 2 (two) times daily. Please take 2 tablets oral twice daily (500 mg oral twice daily), for the next 3 days ( till 02/19/14) , then resume 1 tablet oral (250 mg) twice daily from 12/27 thereafter. Qty: 60 tablet, Refills: 0    megestrol (MEGACE) 40 MG tablet Take 40 mg by mouth 2 (two) times daily.    metoprolol succinate (TOPROL-XL) 50 MG 24 hr tablet Take 1 tablet (50 mg total) by mouth 2 (two) times daily. Take with or immediately following a meal. Qty: 60 tablet, Refills: 3    mirtazapine (REMERON) 15 MG tablet Take 15 mg by mouth daily.   Associated Diagnoses: Slow transit constipation    oxybutynin (DITROPAN-XL) 5 MG 24 hr tablet Take 5 mg by mouth daily.    senna (SENOKOT) 8.6 MG TABS tablet Take 2 tablets (17.2 mg total) by mouth 2 (two) times daily. Qty: 120 each, Refills: 6   Associated Diagnoses: Slow transit constipation    tiZANidine (ZANAFLEX) 4 MG tablet Take 1 tablet (4 mg total) by mouth every 6 (six) hours as needed for muscle spasms. Qty: 30 tablet, Refills: 0      STOP taking these medications     aspirin 81 MG chewable tablet      furosemide (LASIX) 20 MG tablet        No Known Allergies Follow-up Information    Follow up with Shelda Pal, MD. Schedule an appointment as soon as possible for a visit in 4 weeks.   Specialty:  Orthopedic Surgery   Contact information:   [address] [place] [address] [phone]       Follow up with  Cristi Loron, MD. Schedule an appointment as soon as possible for a visit in 1 week.   Specialty:  Neurosurgery   Why:  please obtain a CT head in one week and follow up with Dr Lovell Sheehan.    Contact information:   [address] [place] [address] [phone]       Follow up with Delorse Lek, MD In 2 days.   Specialty:  Family Medicine   Why:  please get CBC on monday and follow up with PCP.    Contact information:   [address] [place] [address]  [phone]        The results of significant diagnostics from this hospitalization (including imaging, microbiology, ancillary and laboratory) are listed below for reference.    Significant Diagnostic Studies: Ct Head Wo Contrast  05/11/2014   CLINICAL DATA:  Follow-up right subdural hematoma  EXAM: CT HEAD WITHOUT CONTRAST  TECHNIQUE: Contiguous axial images were obtained from the base of the skull through the vertex without intravenous contrast.  COMPARISON:  05/10/2014  FINDINGS: The right subdural hematoma appears slightly smaller than yesterday, measuring 8.2 mm in maximum thickness but extending over a smaller portion of the right cerebral surface. The inter hemispheric subdural hematoma appears a little larger, measuring 13 mm in maximum thickness in extending from anterior to posterior within the interhemispheric fissure. Also continues caudally and layers across the tentorium on the right. There is a mild degree of right to left midline shift, about 4 mm. No intraventricular hemorrhage or intraparenchymal hemorrhage is evident. Basal cisterns remain patent. Moderate atrophy and chronic white matter hypodensity is present, likely small vessel disease no acute bony abnormalities are evident. Marked right frontal and periorbital soft tissue thickening again evident.  IMPRESSION: The inter hemispheric subdural hematoma appears a little larger while the right subdural hematoma appears a little  smaller. Overall there is no significant change in mass effect or midline shift.   Electronically Signed   By: Ellery Plunk M.D.   On: 05/11/2014 06:24   Ct Head Wo Contrast  05/10/2014   CLINICAL DATA:  Larey Seat at home this morning  EXAM: CT HEAD WITHOUT CONTRAST  CT MAXILLOFACIAL WITHOUT CONTRAST  CT CERVICAL SPINE WITHOUT CONTRAST  TECHNIQUE: Multidetector CT imaging of the head, cervical spine, and maxillofacial structures were performed using the standard protocol without intravenous contrast. Multiplanar CT image reconstructions of the cervical spine and maxillofacial structures were also generated.  COMPARISON:  02/16/2014  FINDINGS: CT HEAD FINDINGS  There is a large right frontal and temporal subdural hematoma. Maximal thickness measures 9 mm. There is a right to left midline shift measuring 5 mm. There also is subdural blood in the interhemispheric fissure, measuring up to 10 mm thickness. There is mild mass effect on the right lateral ventricle. Basal cisterns remain patent. There is moderate generalized atrophy and periventricular hypodensity consistent with chronic microvascular ischemic disease. No calvarial or skullbase fracture is evident. There is a large right frontal scalp hematoma.  CT MAXILLOFACIAL FINDINGS  No facial or periorbital fracture is evident. Orbital floors are intact. Zygomatic arches are intact. Pterygoid plates are intact. Mandible and TMJ intact. The large periorbital and right frontal scalp hematoma is again noted.  CT CERVICAL SPINE FINDINGS  The cervical vertebrae are normal in height. There is kyphosis centered at C5. There are moderately severe degenerative disc changes at C5-6. The vertebral column, pedicles and facet articulations are intact, with no evidence of acute fracture. No acute soft tissue abnormality is evident.  There is degenerative central canal stenosis, greatest at C5-6 and C6-7.  IMPRESSION: 1. Large right frontal-temporal subdural hematoma with 5 mm  right to left midline shift. There also is acute subdural blood in the interhemispheric fissure. 2. Moderate atrophy and chronic microvascular ischemic disease 3. Large right periorbital and right frontal scalp hematoma 4. No evidence of acute fracture involving the calvarium, skullbase, maxillofacial region or cervical spine 5. Degenerative central canal stenosis in the cervical spine at C5-6 and C6-7 6. Critical Value/emergent results were called by telephone at the time of interpretation on 05/10/2014 at 5:12  am to Dr. Marisa Severin , who verbally acknowledged these results.   Electronically Signed   By: Ellery Plunk M.D.   On: 05/10/2014 05:12   Ct Cervical Spine Wo Contrast  05/10/2014   CLINICAL DATA:  Larey Seat at home this morning  EXAM: CT HEAD WITHOUT CONTRAST  CT MAXILLOFACIAL WITHOUT CONTRAST  CT CERVICAL SPINE WITHOUT CONTRAST  TECHNIQUE: Multidetector CT imaging of the head, cervical spine, and maxillofacial structures were performed using the standard protocol without intravenous contrast. Multiplanar CT image reconstructions of the cervical spine and maxillofacial structures were also generated.  COMPARISON:  02/16/2014  FINDINGS: CT HEAD FINDINGS  There is a large right frontal and temporal subdural hematoma. Maximal thickness measures 9 mm. There is a right to left midline shift measuring 5 mm. There also is subdural blood in the interhemispheric fissure, measuring up to 10 mm thickness. There is mild mass effect on the right lateral ventricle. Basal cisterns remain patent. There is moderate generalized atrophy and periventricular hypodensity consistent with chronic microvascular ischemic disease. No calvarial or skullbase fracture is evident. There is a large right frontal scalp hematoma.  CT MAXILLOFACIAL FINDINGS  No facial or periorbital fracture is evident. Orbital floors are intact. Zygomatic arches are intact. Pterygoid plates are intact. Mandible and TMJ intact. The large periorbital and  right frontal scalp hematoma is again noted.  CT CERVICAL SPINE FINDINGS  The cervical vertebrae are normal in height. There is kyphosis centered at C5. There are moderately severe degenerative disc changes at C5-6. The vertebral column, pedicles and facet articulations are intact, with no evidence of acute fracture. No acute soft tissue abnormality is evident.  There is degenerative central canal stenosis, greatest at C5-6 and C6-7.  IMPRESSION: 1. Large right frontal-temporal subdural hematoma with 5 mm right to left midline shift. There also is acute subdural blood in the interhemispheric fissure. 2. Moderate atrophy and chronic microvascular ischemic disease 3. Large right periorbital and right frontal scalp hematoma 4. No evidence of acute fracture involving the calvarium, skullbase, maxillofacial region or cervical spine 5. Degenerative central canal stenosis in the cervical spine at C5-6 and C6-7 6. Critical Value/emergent results were called by telephone at the time of interpretation on 05/10/2014 at 5:12 am to Dr. Marisa Severin , who verbally acknowledged these results.   Electronically Signed   By: Ellery Plunk M.D.   On: 05/10/2014 05:12   Dg Chest Port 1 View  05/10/2014   CLINICAL DATA:  Larey Seat today in the bathroom. Facial trauma. History of hypertension, anxiety, depression, anemia, diabetes.  EXAM: PORTABLE CHEST - 1 VIEW  COMPARISON:  02/12/2014  FINDINGS: Heart is mildly enlarged. Shallow lung inflation. There are no focal consolidations or pleural effusions. No overt edema.  IMPRESSION: 1. Stable, mild cardiomegaly. 2. Shallow lung inflation.   Electronically Signed   By: Norva Pavlov M.D.   On: 05/10/2014 15:46   Dg Hip Unilat With Pelvis 2-3 Views Right  05/10/2014   CLINICAL DATA:  Larey Seat this morning  EXAM: RIGHT HIP (WITH PELVIS) 2-3 VIEWS  COMPARISON:  04/27/2013  FINDINGS: There is a greater trochanter fracture, nondisplaced. The total hip arthroplasty hardware appears intact. There  is no evidence of dislocation.  IMPRESSION: Acute fracture of the right greater trochanter   Electronically Signed   By: Ellery Plunk M.D.   On: 05/10/2014 06:16   Ct Maxillofacial Wo Cm  05/10/2014   CLINICAL DATA:  Larey Seat at home this morning  EXAM: CT HEAD WITHOUT CONTRAST  CT MAXILLOFACIAL WITHOUT CONTRAST  CT CERVICAL SPINE WITHOUT CONTRAST  TECHNIQUE: Multidetector CT imaging of the head, cervical spine, and maxillofacial structures were performed using the standard protocol without intravenous contrast. Multiplanar CT image reconstructions of the cervical spine and maxillofacial structures were also generated.  COMPARISON:  02/16/2014  FINDINGS: CT HEAD FINDINGS  There is a large right frontal and temporal subdural hematoma. Maximal thickness measures 9 mm. There is a right to left midline shift measuring 5 mm. There also is subdural blood in the interhemispheric fissure, measuring up to 10 mm thickness. There is mild mass effect on the right lateral ventricle. Basal cisterns remain patent. There is moderate generalized atrophy and periventricular hypodensity consistent with chronic microvascular ischemic disease. No calvarial or skullbase fracture is evident. There is a large right frontal scalp hematoma.  CT MAXILLOFACIAL FINDINGS  No facial or periorbital fracture is evident. Orbital floors are intact. Zygomatic arches are intact. Pterygoid plates are intact. Mandible and TMJ intact. The large periorbital and right frontal scalp hematoma is again noted.  CT CERVICAL SPINE FINDINGS  The cervical vertebrae are normal in height. There is kyphosis centered at C5. There are moderately severe degenerative disc changes at C5-6. The vertebral column, pedicles and facet articulations are intact, with no evidence of acute fracture. No acute soft tissue abnormality is evident.  There is degenerative central canal stenosis, greatest at C5-6 and C6-7.  IMPRESSION: 1. Large right frontal-temporal subdural hematoma  with 5 mm right to left midline shift. There also is acute subdural blood in the interhemispheric fissure. 2. Moderate atrophy and chronic microvascular ischemic disease 3. Large right periorbital and right frontal scalp hematoma 4. No evidence of acute fracture involving the calvarium, skullbase, maxillofacial region or cervical spine 5. Degenerative central canal stenosis in the cervical spine at C5-6 and C6-7 6. Critical Value/emergent results were called by telephone at the time of interpretation on 05/10/2014 at 5:12 am to Dr. Marisa Severin , who verbally acknowledged these results.   Electronically Signed   By: Ellery Plunk M.D.   On: 05/10/2014 05:12    Microbiology: Recent Results (from the past 240 hour(s))  MRSA PCR Screening     Status: None   Collection Time: 05/10/14  7:02 PM  Result Value Ref Range Status   MRSA by PCR NEGATIVE NEGATIVE Final    Comment:        The GeneXpert MRSA Assay (FDA approved for NASAL specimens only), is one component of a comprehensive MRSA colonization surveillance program. It is not intended to diagnose MRSA infection nor to guide or monitor treatment for MRSA infections.   Urine culture     Status: None (Preliminary result)   Collection Time: 05/12/14 10:30 AM  Result Value Ref Range Status   Specimen Description URINE, CATHETERIZED  Final   Special Requests NONE  Final   Colony Count PENDING  Incomplete   Culture   Final    Culture reincubated for better growth Performed at [facility]    Report Status PENDING  Incomplete     Labs: Basic Metabolic Panel:  Recent Labs Lab 05/10/14 0519 05/11/14 0317 05/12/14 0451 05/14/14 0630  NA 142 138 137 134*  K 5.0 4.3 5.0 4.4  CL 107 108 106 105  CO2 23 24 22 22   GLUCOSE 103* 108* 96 84  BUN 57* 54* 51* 40*  CREATININE 2.66* 2.73* 2.42* 1.93*  CALCIUM 8.4 7.8* 8.3* 8.0*   Liver Function Tests:  Recent Labs Lab 05/11/14  [id number] 05/12/14 0451  AST 14 13  ALT 11 11   ALKPHOS 37* 40  BILITOT 0.8 0.9  PROT 4.6* 5.4*  ALBUMIN 2.5* 2.8*   No results for input(s): LIPASE, AMYLASE in the last 168 hours. No results for input(s): AMMONIA in the last 168 hours. CBC:  Recent Labs Lab 05/10/14 0519  05/11/14 1908 05/12/14 0451 05/13/14 1100 05/13/14 1926 05/14/14 0630  WBC 9.6  < > 7.5 7.1 6.0 6.8 5.7  NEUTROABS 7.1  --   --   --   --   --   --   HGB 8.1*  < > 8.2* 8.1* 6.9* 8.2* 7.6*  HCT 25.3*  < > 24.7* 24.7* 20.8* 24.1* 22.4*  MCV 91.0  < > 86.4 87.0 87.0 85.8 86.8  PLT 107*  < > 70* 69* 73* 88* 86*  < > = values in this interval not displayed. Cardiac Enzymes: No results for input(s): CKTOTAL, CKMB, CKMBINDEX, TROPONINI in the last 168 hours. BNP: BNP (last 3 results) No results for input(s): BNP in the last 8760 hours.  ProBNP (last 3 results)  Recent Labs  02/12/14 0851  PROBNP 14197.0*    CBG: No results for input(s): GLUCAP in the last 168 hours.     SignedKathlen Mody:  Joas Motton  [facility] 05/14/2014, 4:36 PM

## 2014-05-14 NOTE — Progress Notes (Signed)
Pt needing to have home health pt ot rn aide and sw set up. Cm called. Was right at 1700 so no answer. No oncall number listed in amion for cm and operator and service response did not have an oncall number. This RN confirmed with family that they would like all of these services and are currently set up with [facility]. This RN will contact cm on 05/15/14 to notify them of these needs. Explained to the family that cm would probably contact them on 05/15/14 to confirm setting this up.

## 2014-05-16 LAB — URINE CULTURE: Colony Count: >=100000

## 2014-05-18 ENCOUNTER — Other Ambulatory Visit: Payer: Self-pay | Admitting: Neurosurgery

## 2014-05-18 DIAGNOSIS — S065X9A Traumatic subdural hemorrhage with loss of consciousness of unspecified duration, initial encounter: Secondary | ICD-10-CM

## 2014-05-18 DIAGNOSIS — S065XAA Traumatic subdural hemorrhage with loss of consciousness status unknown, initial encounter: Secondary | ICD-10-CM

## 2014-05-19 ENCOUNTER — Other Ambulatory Visit: Payer: Self-pay | Admitting: Hematology and Oncology

## 2014-05-19 ENCOUNTER — Other Ambulatory Visit (HOSPITAL_BASED_OUTPATIENT_CLINIC_OR_DEPARTMENT_OTHER): Payer: Medicare Other

## 2014-05-19 ENCOUNTER — Ambulatory Visit (HOSPITAL_BASED_OUTPATIENT_CLINIC_OR_DEPARTMENT_OTHER): Payer: Medicare Other

## 2014-05-19 ENCOUNTER — Telehealth: Payer: Self-pay | Admitting: Hematology and Oncology

## 2014-05-19 DIAGNOSIS — D519 Vitamin B12 deficiency anemia, unspecified: Secondary | ICD-10-CM | POA: Diagnosis not present

## 2014-05-19 DIAGNOSIS — N289 Disorder of kidney and ureter, unspecified: Secondary | ICD-10-CM

## 2014-05-19 DIAGNOSIS — N189 Chronic kidney disease, unspecified: Secondary | ICD-10-CM

## 2014-05-19 DIAGNOSIS — D631 Anemia in chronic kidney disease: Secondary | ICD-10-CM

## 2014-05-19 LAB — CBC WITH DIFFERENTIAL/PLATELET
BASO%: 1.3 % (ref 0.0–2.0)
Basophils Absolute: 0.1 10*3/uL (ref 0.0–0.1)
EOS ABS: 0.3 10*3/uL (ref 0.0–0.5)
EOS%: 4.9 % (ref 0.0–7.0)
HEMATOCRIT: 25.5 % — AB (ref 34.8–46.6)
HGB: 8.1 g/dL — ABNORMAL LOW (ref 11.6–15.9)
LYMPH%: 22.7 % (ref 14.0–49.7)
MCH: 28.2 pg (ref 25.1–34.0)
MCHC: 31.9 g/dL (ref 31.5–36.0)
MCV: 88.6 fL (ref 79.5–101.0)
MONO#: 0.7 10*3/uL (ref 0.1–0.9)
MONO%: 11.1 % (ref 0.0–14.0)
NEUT#: 4 10*3/uL (ref 1.5–6.5)
NEUT%: 60 % (ref 38.4–76.8)
Platelets: 197 10*3/uL (ref 145–400)
RBC: 2.88 10*6/uL — ABNORMAL LOW (ref 3.70–5.45)
RDW: 14.7 % — AB (ref 11.2–14.5)
WBC: 6.7 10*3/uL (ref 3.9–10.3)
lymph#: 1.5 10*3/uL (ref 0.9–3.3)

## 2014-05-19 LAB — IRON AND TIBC CHCC
%SAT: 37 % (ref 21–57)
Iron: 59 ug/dL (ref 41–142)
TIBC: 159 ug/dL — ABNORMAL LOW (ref 236–444)
UIBC: 100 ug/dL — AB (ref 120–384)

## 2014-05-19 LAB — FERRITIN CHCC: Ferritin: 1650 ng/ml — ABNORMAL HIGH (ref 9–269)

## 2014-05-19 LAB — VITAMIN B12: Vitamin B-12: 1743 pg/mL — ABNORMAL HIGH (ref 211–911)

## 2014-05-19 MED ORDER — DARBEPOETIN ALFA 300 MCG/0.6ML IJ SOSY
300.0000 ug | PREFILLED_SYRINGE | Freq: Once | INTRAMUSCULAR | Status: AC
  Administered 2014-05-19: 300 ug via SUBCUTANEOUS
  Filled 2014-05-19: qty 0.6

## 2014-05-19 MED ORDER — DARBEPOETIN ALFA-POLYSORBATE 500 MCG/ML IJ SOLN
300.0000 ug | Freq: Once | INTRAMUSCULAR | Status: DC
  Administered 2014-05-19: 300 ug via SUBCUTANEOUS

## 2014-05-19 NOTE — Addendum Note (Signed)
Addended by: Phyllis GingerMACK, Quenna Doepke M on: 05/19/2014 02:28 PM   Modules accepted: Orders

## 2014-05-19 NOTE — Telephone Encounter (Signed)
gave and printed appt sched and avs for pt for March...sed added tx.

## 2014-05-20 ENCOUNTER — Ambulatory Visit
Admission: RE | Admit: 2014-05-20 | Discharge: 2014-05-20 | Disposition: A | Discharge status: Home / Self Care | Payer: Medicare Other | Source: Ambulatory Visit | Attending: Neurosurgery | Admitting: Neurosurgery

## 2014-05-20 DIAGNOSIS — S065X9A Traumatic subdural hemorrhage with loss of consciousness of unspecified duration, initial encounter: Secondary | ICD-10-CM

## 2014-05-20 DIAGNOSIS — S065XAA Traumatic subdural hemorrhage with loss of consciousness status unknown, initial encounter: Secondary | ICD-10-CM

## 2014-05-23 ENCOUNTER — Encounter (HOSPITAL_COMMUNITY): Payer: Self-pay | Admitting: Neurology

## 2014-05-23 ENCOUNTER — Inpatient Hospital Stay (HOSPITAL_COMMUNITY): Payer: Medicare Other | Admitting: Certified Registered Nurse Anesthetist

## 2014-05-23 ENCOUNTER — Inpatient Hospital Stay (HOSPITAL_COMMUNITY): Admission: RE | Admit: 2014-05-23 | Payer: Medicare Other | Source: Ambulatory Visit | Admitting: Neurosurgery

## 2014-05-23 ENCOUNTER — Inpatient Hospital Stay (HOSPITAL_COMMUNITY): Admission: AD | Admit: 2014-05-23 | Payer: Medicare Other | Source: Ambulatory Visit | Admitting: Neurosurgery

## 2014-05-23 ENCOUNTER — Encounter (HOSPITAL_COMMUNITY)
Admission: EM | Disposition: A | Discharge status: Hospice | Payer: Self-pay | Source: Home / Self Care | Attending: Neurosurgery

## 2014-05-23 ENCOUNTER — Inpatient Hospital Stay (HOSPITAL_COMMUNITY): Payer: Medicare Other

## 2014-05-23 ENCOUNTER — Inpatient Hospital Stay (HOSPITAL_COMMUNITY)
Admission: EM | Admit: 2014-05-23 | Discharge: 2014-05-28 | DRG: 025 | Disposition: A | Discharge status: Hospice | Payer: Medicare Other | Attending: Neurosurgery | Admitting: Neurosurgery

## 2014-05-23 ENCOUNTER — Emergency Department (HOSPITAL_COMMUNITY): Payer: Medicare Other

## 2014-05-23 ENCOUNTER — Other Ambulatory Visit: Payer: Self-pay | Admitting: Neurosurgery

## 2014-05-23 DIAGNOSIS — R0689 Other abnormalities of breathing: Secondary | ICD-10-CM | POA: Diagnosis not present

## 2014-05-23 DIAGNOSIS — I6200 Nontraumatic subdural hemorrhage, unspecified: Secondary | ICD-10-CM | POA: Diagnosis not present

## 2014-05-23 DIAGNOSIS — N186 End stage renal disease: Secondary | ICD-10-CM | POA: Diagnosis present

## 2014-05-23 DIAGNOSIS — D693 Immune thrombocytopenic purpura: Secondary | ICD-10-CM | POA: Diagnosis present

## 2014-05-23 DIAGNOSIS — E119 Type 2 diabetes mellitus without complications: Secondary | ICD-10-CM | POA: Diagnosis present

## 2014-05-23 DIAGNOSIS — S065X0A Traumatic subdural hemorrhage without loss of consciousness, initial encounter: Secondary | ICD-10-CM

## 2014-05-23 DIAGNOSIS — I5022 Chronic systolic (congestive) heart failure: Secondary | ICD-10-CM | POA: Diagnosis present

## 2014-05-23 DIAGNOSIS — D638 Anemia in other chronic diseases classified elsewhere: Secondary | ICD-10-CM | POA: Diagnosis present

## 2014-05-23 DIAGNOSIS — F419 Anxiety disorder, unspecified: Secondary | ICD-10-CM | POA: Diagnosis present

## 2014-05-23 DIAGNOSIS — Z96642 Presence of left artificial hip joint: Secondary | ICD-10-CM | POA: Diagnosis present

## 2014-05-23 DIAGNOSIS — Z789 Other specified health status: Secondary | ICD-10-CM | POA: Diagnosis not present

## 2014-05-23 DIAGNOSIS — E785 Hyperlipidemia, unspecified: Secondary | ICD-10-CM | POA: Diagnosis present

## 2014-05-23 DIAGNOSIS — D696 Thrombocytopenia, unspecified: Secondary | ICD-10-CM | POA: Diagnosis not present

## 2014-05-23 DIAGNOSIS — Z4659 Encounter for fitting and adjustment of other gastrointestinal appliance and device: Secondary | ICD-10-CM

## 2014-05-23 DIAGNOSIS — N189 Chronic kidney disease, unspecified: Secondary | ICD-10-CM | POA: Insufficient documentation

## 2014-05-23 DIAGNOSIS — Z515 Encounter for palliative care: Secondary | ICD-10-CM | POA: Diagnosis not present

## 2014-05-23 DIAGNOSIS — J9690 Respiratory failure, unspecified, unspecified whether with hypoxia or hypercapnia: Secondary | ICD-10-CM | POA: Insufficient documentation

## 2014-05-23 DIAGNOSIS — N390 Urinary tract infection, site not specified: Secondary | ICD-10-CM | POA: Diagnosis present

## 2014-05-23 DIAGNOSIS — R4182 Altered mental status, unspecified: Secondary | ICD-10-CM | POA: Diagnosis present

## 2014-05-23 DIAGNOSIS — Z66 Do not resuscitate: Secondary | ICD-10-CM | POA: Diagnosis present

## 2014-05-23 DIAGNOSIS — R0600 Dyspnea, unspecified: Secondary | ICD-10-CM | POA: Diagnosis not present

## 2014-05-23 DIAGNOSIS — D631 Anemia in chronic kidney disease: Secondary | ICD-10-CM | POA: Diagnosis not present

## 2014-05-23 DIAGNOSIS — J9601 Acute respiratory failure with hypoxia: Secondary | ICD-10-CM | POA: Diagnosis not present

## 2014-05-23 DIAGNOSIS — J9600 Acute respiratory failure, unspecified whether with hypoxia or hypercapnia: Secondary | ICD-10-CM | POA: Diagnosis not present

## 2014-05-23 DIAGNOSIS — J449 Chronic obstructive pulmonary disease, unspecified: Secondary | ICD-10-CM | POA: Diagnosis present

## 2014-05-23 DIAGNOSIS — S065X9A Traumatic subdural hemorrhage with loss of consciousness of unspecified duration, initial encounter: Secondary | ICD-10-CM | POA: Diagnosis present

## 2014-05-23 DIAGNOSIS — M1990 Unspecified osteoarthritis, unspecified site: Secondary | ICD-10-CM | POA: Diagnosis present

## 2014-05-23 DIAGNOSIS — W1830XA Fall on same level, unspecified, initial encounter: Secondary | ICD-10-CM | POA: Diagnosis present

## 2014-05-23 DIAGNOSIS — F329 Major depressive disorder, single episode, unspecified: Secondary | ICD-10-CM | POA: Diagnosis present

## 2014-05-23 DIAGNOSIS — R34 Anuria and oliguria: Secondary | ICD-10-CM | POA: Diagnosis present

## 2014-05-23 DIAGNOSIS — S065XAA Traumatic subdural hemorrhage with loss of consciousness status unknown, initial encounter: Secondary | ICD-10-CM | POA: Diagnosis present

## 2014-05-23 DIAGNOSIS — J95821 Acute postprocedural respiratory failure: Secondary | ICD-10-CM | POA: Diagnosis not present

## 2014-05-23 DIAGNOSIS — I10 Essential (primary) hypertension: Secondary | ICD-10-CM

## 2014-05-23 DIAGNOSIS — E538 Deficiency of other specified B group vitamins: Secondary | ICD-10-CM | POA: Diagnosis present

## 2014-05-23 DIAGNOSIS — Z720 Tobacco use: Secondary | ICD-10-CM

## 2014-05-23 DIAGNOSIS — S065X9S Traumatic subdural hemorrhage with loss of consciousness of unspecified duration, sequela: Secondary | ICD-10-CM

## 2014-05-23 DIAGNOSIS — R51 Headache: Secondary | ICD-10-CM | POA: Diagnosis present

## 2014-05-23 DIAGNOSIS — F17220 Nicotine dependence, chewing tobacco, uncomplicated: Secondary | ICD-10-CM | POA: Diagnosis present

## 2014-05-23 DIAGNOSIS — Z978 Presence of other specified devices: Secondary | ICD-10-CM | POA: Insufficient documentation

## 2014-05-23 DIAGNOSIS — R011 Cardiac murmur, unspecified: Secondary | ICD-10-CM | POA: Diagnosis present

## 2014-05-23 DIAGNOSIS — R17 Unspecified jaundice: Secondary | ICD-10-CM

## 2014-05-23 HISTORY — PX: CRANIOTOMY: SHX93

## 2014-05-23 LAB — CBC WITH DIFFERENTIAL/PLATELET
BASOS ABS: 0 10*3/uL (ref 0.0–0.1)
BASOS PCT: 0 % (ref 0–1)
EOS ABS: 0.1 10*3/uL (ref 0.0–0.7)
EOS PCT: 1 % (ref 0–5)
HCT: 32.4 % — ABNORMAL LOW (ref 36.0–46.0)
HEMOGLOBIN: 10.3 g/dL — AB (ref 12.0–15.0)
Lymphocytes Relative: 19 % (ref 12–46)
Lymphs Abs: 1.9 10*3/uL (ref 0.7–4.0)
MCH: 28.6 pg (ref 26.0–34.0)
MCHC: 31.8 g/dL (ref 30.0–36.0)
MCV: 90 fL (ref 78.0–100.0)
MONO ABS: 1.3 10*3/uL — AB (ref 0.1–1.0)
Monocytes Relative: 13 % — ABNORMAL HIGH (ref 3–12)
NEUTROS PCT: 67 % (ref 43–77)
Neutro Abs: 7.1 10*3/uL (ref 1.7–7.7)
PLATELETS: 259 10*3/uL (ref 150–400)
RBC: 3.6 MIL/uL — ABNORMAL LOW (ref 3.87–5.11)
RDW: 14.3 % (ref 11.5–15.5)
WBC: 10.4 10*3/uL (ref 4.0–10.5)

## 2014-05-23 LAB — COMPREHENSIVE METABOLIC PANEL
ALBUMIN: 3.5 g/dL (ref 3.5–5.2)
ALT: 11 U/L (ref 0–35)
ANION GAP: 11 (ref 5–15)
AST: 19 U/L (ref 0–37)
Alkaline Phosphatase: 74 U/L (ref 39–117)
BILIRUBIN TOTAL: 1.3 mg/dL — AB (ref 0.3–1.2)
BUN: 40 mg/dL — AB (ref 6–23)
CALCIUM: 9 mg/dL (ref 8.4–10.5)
CO2: 19 mmol/L (ref 19–32)
Chloride: 105 mmol/L (ref 96–112)
Creatinine, Ser: 1.96 mg/dL — ABNORMAL HIGH (ref 0.50–1.10)
GFR calc Af Amer: 27 mL/min — ABNORMAL LOW (ref >90)
GFR calc non Af Amer: 23 mL/min — ABNORMAL LOW (ref >90)
Glucose, Bld: 109 mg/dL — ABNORMAL HIGH (ref 70–99)
Potassium: 4.9 mmol/L (ref 3.5–5.1)
Sodium: 135 mmol/L (ref 135–145)
TOTAL PROTEIN: 6.9 g/dL (ref 6.0–8.3)

## 2014-05-23 LAB — BLOOD GAS, ARTERIAL
Acid-base deficit: 5.7 mmol/L — ABNORMAL HIGH (ref 0.0–2.0)
BICARBONATE: 18 meq/L — AB (ref 20.0–24.0)
Drawn by: 365271
FIO2: 0.4 %
O2 Saturation: 99.2 %
PATIENT TEMPERATURE: 97.1
PEEP/CPAP: 5 cmH2O
RATE: 14 resp/min
TCO2: 18.9 mmol/L (ref 0–100)
VT: 420 mL
pCO2 arterial: 27.6 mmHg — ABNORMAL LOW (ref 35.0–45.0)
pH, Arterial: 7.426 (ref 7.350–7.450)
pO2, Arterial: 216 mmHg — ABNORMAL HIGH (ref 80.0–100.0)

## 2014-05-23 LAB — BASIC METABOLIC PANEL
Anion gap: 8 (ref 5–15)
BUN: 37 mg/dL — AB (ref 6–23)
CO2: 19 mmol/L (ref 19–32)
Calcium: 7.8 mg/dL — ABNORMAL LOW (ref 8.4–10.5)
Chloride: 111 mmol/L (ref 96–112)
Creatinine, Ser: 1.79 mg/dL — ABNORMAL HIGH (ref 0.50–1.10)
GFR calc non Af Amer: 26 mL/min — ABNORMAL LOW (ref >90)
GFR, EST AFRICAN AMERICAN: 30 mL/min — AB (ref >90)
GLUCOSE: 97 mg/dL (ref 70–99)
Potassium: 4.1 mmol/L (ref 3.5–5.1)
Sodium: 138 mmol/L (ref 135–145)

## 2014-05-23 LAB — MRSA PCR SCREENING: MRSA BY PCR: NEGATIVE

## 2014-05-23 LAB — CBC
HCT: 23.6 % — ABNORMAL LOW (ref 36.0–46.0)
Hemoglobin: 7.7 g/dL — ABNORMAL LOW (ref 12.0–15.0)
MCH: 29.2 pg (ref 26.0–34.0)
MCHC: 33.1 g/dL (ref 30.0–36.0)
MCV: 88.4 fL (ref 78.0–100.0)
PLATELETS: 199 10*3/uL (ref 150–400)
RBC: 2.67 MIL/uL — AB (ref 3.87–5.11)
RDW: 14.5 % (ref 11.5–15.5)
WBC: 5.1 10*3/uL (ref 4.0–10.5)

## 2014-05-23 LAB — TYPE AND SCREEN
ABO/RH(D): O POS
Antibody Screen: NEGATIVE

## 2014-05-23 LAB — GLUCOSE, CAPILLARY: Glucose-Capillary: 85 mg/dL (ref 70–99)

## 2014-05-23 SURGERY — CRANIOTOMY HEMATOMA EVACUATION SUBDURAL
Anesthesia: General | Site: Head

## 2014-05-23 MED ORDER — PHENYLEPHRINE HCL 10 MG/ML IJ SOLN
10.0000 mg | INTRAVENOUS | Status: DC | PRN
  Administered 2014-05-23: 10 ug/min via INTRAVENOUS

## 2014-05-23 MED ORDER — PHENYLEPHRINE HCL 10 MG/ML IJ SOLN
INTRAMUSCULAR | Status: AC
  Filled 2014-05-23: qty 1

## 2014-05-23 MED ORDER — PROPOFOL 10 MG/ML IV BOLUS
INTRAVENOUS | Status: AC
  Filled 2014-05-23: qty 20

## 2014-05-23 MED ORDER — FENTANYL CITRATE 0.05 MG/ML IJ SOLN: 25.0000 ug | INTRAMUSCULAR | Status: DC | PRN

## 2014-05-23 MED ORDER — INSULIN ASPART 100 UNIT/ML ~~LOC~~ SOLN: 2.0000 [IU] | SUBCUTANEOUS | Status: DC

## 2014-05-23 MED ORDER — MICROFIBRILLAR COLL HEMOSTAT EX PADS
MEDICATED_PAD | CUTANEOUS | Status: DC | PRN
  Administered 2014-05-23: 1 via TOPICAL

## 2014-05-23 MED ORDER — ALBUTEROL SULFATE (2.5 MG/3ML) 0.083% IN NEBU: 2.5000 mg | INHALATION_SOLUTION | RESPIRATORY_TRACT | Status: DC | PRN

## 2014-05-23 MED ORDER — THROMBIN 5000 UNITS EX SOLR
CUTANEOUS | Status: DC | PRN
  Administered 2014-05-23 (×2): 5 mL via TOPICAL

## 2014-05-23 MED ORDER — PROPOFOL 10 MG/ML IV BOLUS
INTRAVENOUS | Status: DC | PRN
  Administered 2014-05-23: 70 mg via INTRAVENOUS

## 2014-05-23 MED ORDER — FENTANYL CITRATE 0.05 MG/ML IJ SOLN
INTRAMUSCULAR | Status: AC
  Filled 2014-05-23: qty 5

## 2014-05-23 MED ORDER — ONDANSETRON HCL 4 MG/2ML IJ SOLN: 4.0000 mg | INTRAMUSCULAR | Status: DC | PRN

## 2014-05-23 MED ORDER — MIDAZOLAM HCL 2 MG/2ML IJ SOLN
INTRAMUSCULAR | Status: AC
  Filled 2014-05-23: qty 2

## 2014-05-23 MED ORDER — PROMETHAZINE HCL 25 MG PO TABS: 12.5000 mg | ORAL_TABLET | ORAL | Status: DC | PRN

## 2014-05-23 MED ORDER — SODIUM CHLORIDE 0.9 % IV SOLN
INTRAVENOUS | Status: DC
  Administered 2014-05-23 – 2014-05-28 (×5): via INTRAVENOUS

## 2014-05-23 MED ORDER — CEFAZOLIN SODIUM-DEXTROSE 2-3 GM-% IV SOLR
INTRAVENOUS | Status: DC | PRN
  Administered 2014-05-23: 1 g via INTRAVENOUS

## 2014-05-23 MED ORDER — ONDANSETRON HCL 4 MG/2ML IJ SOLN: 4.0000 mg | Freq: Once | INTRAMUSCULAR | Status: AC | PRN

## 2014-05-23 MED ORDER — LIDOCAINE-EPINEPHRINE 1 %-1:100000 IJ SOLN
INTRAMUSCULAR | Status: DC | PRN
  Administered 2014-05-23: 10 mL via INTRADERMAL

## 2014-05-23 MED ORDER — THROMBIN 20000 UNITS EX SOLR
CUTANEOUS | Status: DC | PRN
  Administered 2014-05-23: 20 mL via TOPICAL

## 2014-05-23 MED ORDER — LABETALOL HCL 5 MG/ML IV SOLN
20.0000 mg | Freq: Once | INTRAVENOUS | Status: AC
  Administered 2014-05-23: 20 mg via INTRAVENOUS
  Filled 2014-05-23: qty 4

## 2014-05-23 MED ORDER — ONDANSETRON HCL 4 MG PO TABS: 4.0000 mg | ORAL_TABLET | ORAL | Status: DC | PRN

## 2014-05-23 MED ORDER — CHLORHEXIDINE GLUCONATE 0.12 % MT SOLN
15.0000 mL | Freq: Two times a day (BID) | OROMUCOSAL | Status: DC
  Administered 2014-05-23 – 2014-05-27 (×9): 15 mL via OROMUCOSAL
  Filled 2014-05-23 (×9): qty 15

## 2014-05-23 MED ORDER — LIDOCAINE HCL (CARDIAC) 20 MG/ML IV SOLN
INTRAVENOUS | Status: DC | PRN
  Administered 2014-05-23: 40 mg via INTRAVENOUS

## 2014-05-23 MED ORDER — LABETALOL HCL 5 MG/ML IV SOLN
10.0000 mg | INTRAVENOUS | Status: DC | PRN
  Administered 2014-05-23: 20 mg via INTRAVENOUS
  Administered 2014-05-23: 10 mg via INTRAVENOUS
  Administered 2014-05-24 (×2): 20 mg via INTRAVENOUS
  Administered 2014-05-24 (×2): 40 mg via INTRAVENOUS
  Administered 2014-05-24 (×2): 20 mg via INTRAVENOUS
  Administered 2014-05-24 – 2014-05-26 (×10): 40 mg via INTRAVENOUS
  Filled 2014-05-23 (×2): qty 8
  Filled 2014-05-23: qty 4
  Filled 2014-05-23 (×4): qty 8
  Filled 2014-05-23 (×2): qty 4
  Filled 2014-05-23 (×7): qty 8
  Filled 2014-05-23: qty 4

## 2014-05-23 MED ORDER — CETYLPYRIDINIUM CHLORIDE 0.05 % MT LIQD
7.0000 mL | Freq: Four times a day (QID) | OROMUCOSAL | Status: DC
  Administered 2014-05-23 – 2014-05-28 (×18): 7 mL via OROMUCOSAL

## 2014-05-23 MED ORDER — ROCURONIUM BROMIDE 100 MG/10ML IV SOLN
INTRAVENOUS | Status: DC | PRN
  Administered 2014-05-23: 20 mg via INTRAVENOUS
  Administered 2014-05-23: 30 mg via INTRAVENOUS

## 2014-05-23 MED ORDER — PANTOPRAZOLE SODIUM 40 MG IV SOLR
40.0000 mg | Freq: Every day | INTRAVENOUS | Status: DC
  Administered 2014-05-23 – 2014-05-24 (×2): 40 mg via INTRAVENOUS
  Filled 2014-05-23 (×4): qty 40

## 2014-05-23 MED ORDER — PROPOFOL 10 MG/ML IV EMUL
5.0000 ug/kg/min | INTRAVENOUS | Status: AC
  Administered 2014-05-23: 10 ug/kg/min via INTRAVENOUS
  Administered 2014-05-24: 20 ug/kg/min via INTRAVENOUS
  Filled 2014-05-23 (×2): qty 100

## 2014-05-23 MED ORDER — 0.9 % SODIUM CHLORIDE (POUR BTL) OPTIME
TOPICAL | Status: DC | PRN
  Administered 2014-05-23 (×2): 1000 mL

## 2014-05-23 MED ORDER — SODIUM CHLORIDE 0.9 % IV SOLN
INTRAVENOUS | Status: DC | PRN
  Administered 2014-05-23: 18:00:00 via INTRAVENOUS

## 2014-05-23 MED ORDER — FENTANYL CITRATE 0.05 MG/ML IJ SOLN
INTRAMUSCULAR | Status: DC | PRN
  Administered 2014-05-23 (×2): 50 ug via INTRAVENOUS
  Administered 2014-05-23: 100 ug via INTRAVENOUS
  Administered 2014-05-23: 50 ug via INTRAVENOUS

## 2014-05-23 MED ORDER — CEFAZOLIN SODIUM-DEXTROSE 2-3 GM-% IV SOLR
INTRAVENOUS | Status: AC
  Filled 2014-05-23: qty 50

## 2014-05-23 MED ORDER — LEVETIRACETAM IN NACL 500 MG/100ML IV SOLN
500.0000 mg | Freq: Two times a day (BID) | INTRAVENOUS | Status: DC
  Administered 2014-05-23 – 2014-05-28 (×10): 500 mg via INTRAVENOUS
  Filled 2014-05-23 (×11): qty 100

## 2014-05-23 MED ORDER — MIDAZOLAM HCL 5 MG/5ML IJ SOLN
INTRAMUSCULAR | Status: DC | PRN
  Administered 2014-05-23: 2 mg via INTRAVENOUS

## 2014-05-23 MED ORDER — PHENYLEPHRINE HCL 10 MG/ML IJ SOLN
INTRAMUSCULAR | Status: DC | PRN
  Administered 2014-05-23 (×3): 80 ug via INTRAVENOUS

## 2014-05-23 SURGICAL SUPPLY — 62 items
BANDAGE GAUZE 4  KLING STR (GAUZE/BANDAGES/DRESSINGS) IMPLANT
BIT DRILL WIRE PASS 1.3MM (BIT) IMPLANT
BLADE CLIPPER SURG (BLADE) IMPLANT
BRUSH SCRUB EZ PLAIN DRY (MISCELLANEOUS) ×3 IMPLANT
BUR ACORN 6.0 PRECISION (BURR) ×2 IMPLANT
BUR ACORN 6.0MM PRECISION (BURR) ×1
BUR ROUTER D-58 CRANI (BURR) IMPLANT
CANISTER SUCT 3000ML PPV (MISCELLANEOUS) ×3 IMPLANT
CONT SPEC 4OZ CLIKSEAL STRL BL (MISCELLANEOUS) ×3 IMPLANT
DRAIN SNY WOU 7FLT (WOUND CARE) IMPLANT
DRAPE SURG IRRIG POUCH 19X23 (DRAPES) IMPLANT
DRAPE WARM FLUID 44X44 (DRAPE) ×3 IMPLANT
DRILL WIRE PASS 1.3MM (BIT)
DRSG PAD ABDOMINAL 8X10 ST (GAUZE/BANDAGES/DRESSINGS) IMPLANT
DURAMATRIX ONLAY 3X3 (Plate) ×2 IMPLANT
DURAPREP 6ML APPLICATOR 50/CS (WOUND CARE) ×3 IMPLANT
ELECT CAUTERY BLADE 6.4 (BLADE) ×3 IMPLANT
ELECT REM PT RETURN 9FT ADLT (ELECTROSURGICAL) ×3
ELECTRODE REM PT RTRN 9FT ADLT (ELECTROSURGICAL) ×1 IMPLANT
EVACUATOR 1/8 PVC DRAIN (DRAIN) IMPLANT
EVACUATOR SILICONE 100CC (DRAIN) IMPLANT
GAUZE SPONGE 4X4 12PLY STRL (GAUZE/BANDAGES/DRESSINGS) ×2 IMPLANT
GAUZE SPONGE 4X4 16PLY XRAY LF (GAUZE/BANDAGES/DRESSINGS) IMPLANT
GLOVE BIO SURGEON STRL SZ 6.5 (GLOVE) ×1 IMPLANT
GLOVE BIO SURGEONS STRL SZ 6.5 (GLOVE) ×1
GLOVE BIOGEL M 8.0 STRL (GLOVE) ×3 IMPLANT
GLOVE EXAM NITRILE LRG STRL (GLOVE) IMPLANT
GLOVE EXAM NITRILE MD LF STRL (GLOVE) IMPLANT
GLOVE EXAM NITRILE XL STR (GLOVE) IMPLANT
GLOVE EXAM NITRILE XS STR PU (GLOVE) IMPLANT
GLOVE INDICATOR 6.5 STRL GRN (GLOVE) ×2 IMPLANT
GOWN STRL REUS W/ TWL LRG LVL3 (GOWN DISPOSABLE) ×1 IMPLANT
GOWN STRL REUS W/ TWL XL LVL3 (GOWN DISPOSABLE) IMPLANT
GOWN STRL REUS W/TWL 2XL LVL3 (GOWN DISPOSABLE) IMPLANT
GOWN STRL REUS W/TWL LRG LVL3 (GOWN DISPOSABLE) ×6
GOWN STRL REUS W/TWL XL LVL3 (GOWN DISPOSABLE)
HEMOSTAT SURGICEL 2X14 (HEMOSTASIS) ×3 IMPLANT
KIT BASIN OR (CUSTOM PROCEDURE TRAY) ×3 IMPLANT
KIT ROOM TURNOVER OR (KITS) ×3 IMPLANT
NS IRRIG 1000ML POUR BTL (IV SOLUTION) ×3 IMPLANT
PACK CRANIOTOMY (CUSTOM PROCEDURE TRAY) ×3 IMPLANT
PAD ARMBOARD 7.5X6 YLW CONV (MISCELLANEOUS) ×3 IMPLANT
PATTIES SURGICAL .5 X.5 (GAUZE/BANDAGES/DRESSINGS) IMPLANT
PATTIES SURGICAL .5 X3 (DISPOSABLE) IMPLANT
PATTIES SURGICAL 1X1 (DISPOSABLE) IMPLANT
PIN MAYFIELD SKULL DISP (PIN) IMPLANT
PLATE 1.5  2HOLE LNG NEURO (Plate) ×6 IMPLANT
PLATE 1.5 2HOLE LNG NEURO (Plate) IMPLANT
SCREW SELF DRILL HT 1.5/4MM (Screw) ×12 IMPLANT
SPONGE NEURO XRAY DETECT 1X3 (DISPOSABLE) IMPLANT
SPONGE SURGIFOAM ABS GEL 100 (HEMOSTASIS) ×3 IMPLANT
STAPLER SKIN PROX WIDE 3.9 (STAPLE) ×3 IMPLANT
SUT NURALON 4 0 TR CR/8 (SUTURE) ×6 IMPLANT
SUT VIC AB 2-0 CP2 18 (SUTURE) ×6 IMPLANT
TAPE CLOTH SURG 4X10 WHT LF (GAUZE/BANDAGES/DRESSINGS) ×2 IMPLANT
TOWEL OR 17X24 6PK STRL BLUE (TOWEL DISPOSABLE) ×3 IMPLANT
TOWEL OR 17X26 10 PK STRL BLUE (TOWEL DISPOSABLE) ×3 IMPLANT
TRAY FOLEY CATH 14FRSI W/METER (CATHETERS) IMPLANT
TUBE CONNECTING 12'X1/4 (SUCTIONS) ×1
TUBE CONNECTING 12X1/4 (SUCTIONS) ×2 IMPLANT
UNDERPAD 30X30 INCONTINENT (UNDERPADS AND DIAPERS) IMPLANT
WATER STERILE IRR 1000ML POUR (IV SOLUTION) ×3 IMPLANT

## 2014-05-23 NOTE — Transfer of Care (Signed)
Immediate Anesthesia Transfer of Care Note  Patient: [name]  Procedure(s) Performed: Procedure(s) with comments: CRANIOTOMY HEMATOMA EVACUATION SUBDURAL (N/A) - CRANIOTOMY HEMATOMA EVACUATION SUBDURAL  Patient Location: SICU  Anesthesia Type:General  Level of Consciousness: Patient remains intubated per anesthesia plan  Airway & Oxygen Therapy: Patient remains intubated per anesthesia plan and Patient placed on Ventilator (see vital sign flow sheet for setting)  Post-op Assessment: Report given to RN and Post -op Vital signs reviewed and stable  Post vital signs: Reviewed and stable  Last Vitals:  Filed Vitals:   05/23/14 1715  BP: 149/64  Pulse: 90  Temp:   Resp: 18    Complications: No apparent anesthesia complications

## 2014-05-23 NOTE — H&P (Signed)
[name] is an 81 y.o. female.   Chief Complaint: subdural hematoma HPI: patient who was discharge from [facility] hospital after she fell. It was found  To have a SDH in the right side. Also a history of thrombocytopenia associated with anemia. 3 days ago she had a ct head and today she was seen by her MD who found her unresponsive. In the ER i found her f/c but quite weak in the left side  Past Medical History  Diagnosis Date  . Hypertension   . Anxiety and depression   . Chronic headaches   . Diverticular disease   . Anemia   . Arthritis     osteoarthritis  . Osteoporosis   . Vitamin B 12 deficiency   . History of ITP   . Anxiety     takes xanax for sleep  . Diabetes mellitus     NIDDM x 12 years  . Heart murmur     benign;asymptomatic  . Chronic kidney disease     ESRD - AV fistula in LUE (Dr. Gershon Cull = nephrologist)  . Left knee pain 02/08/2013  . Cachexia 02/08/2013  . Hyperlipidemia   . Tremors of nervous system   . Constipated 09/30/2013  . Left ventricular dysfunction     Past Surgical History  Procedure Laterality Date  . Av fistula placement  11/15/10    left upper arm AVF  . Abdominal hysterectomy  1973  . Joint replacement  2007    rt hip  . Eye surgery    . Cataract extraction w/ intraocular lens  implant, bilateral  2002  . Vascular surgery  10-2010    left AVF  . Hernia repair  2000    ventral  . Ventral hernia repair  08/06/2011    Procedure: HERNIA REPAIR VENTRAL ADULT;  Surgeon: Zenovia Jarred, MD;  Location: [facility];  Service: General;  Laterality: N/A;  Open Repair [place] with Mesh  . Cholecystectomy  yrs ago  . Total hip arthroplasty Left 04/27/2013    Procedure: LEFT TOTAL HIP ARTHROPLASTY ANTERIOR APPROACH;  Surgeon: Mauri Pole, MD;  Location: [facility];  Service: Orthopedics;  Laterality: Left;    Family History  Problem Relation Age of Onset  . Stroke Mother   . Heart disease Father    Social History:  reports that she  has never smoked. Her smokeless tobacco use includes Snuff. She reports that she does not drink alcohol or use illicit drugs.  Allergies:  Allergies  Allergen Reactions  . Other Other (See Comments)    No Narcotics meds-ONLY TYLENOLper family request     (Not in a hospital admission)  Results for orders placed or performed during the hospital encounter of 05/23/14 (from the past 48 hour(s))  CBC with Differential/Platelet     Status: Abnormal   Collection Time: 05/23/14  1:39 PM  Result Value Ref Range   WBC 10.4 4.0 - 10.5 K/uL   RBC 3.60 (L) 3.87 - 5.11 MIL/uL   Hemoglobin 10.3 (L) 12.0 - 15.0 g/dL   HCT 32.4 (L) 36.0 - 46.0 %   MCV 90.0 78.0 - 100.0 fL   MCH 28.6 26.0 - 34.0 pg   MCHC 31.8 30.0 - 36.0 g/dL   RDW 14.3 11.5 - 15.5 %   Platelets 259 150 - 400 K/uL   Neutrophils Relative % 67 43 - 77 %   Neutro Abs 7.1 1.7 - 7.7 K/uL   Lymphocytes Relative 19 12 -  46 %   Lymphs Abs 1.9 0.7 - 4.0 K/uL   Monocytes Relative 13 (H) 3 - 12 %   Monocytes Absolute 1.3 (H) 0.1 - 1.0 K/uL   Eosinophils Relative 1 0 - 5 %   Eosinophils Absolute 0.1 0.0 - 0.7 K/uL   Basophils Relative 0 0 - 1 %   Basophils Absolute 0.0 0.0 - 0.1 K/uL  Comprehensive metabolic panel     Status: Abnormal   Collection Time: 05/23/14  1:39 PM  Result Value Ref Range   Sodium 135 135 - 145 mmol/L   Potassium 4.9 3.5 - 5.1 mmol/L   Chloride 105 96 - 112 mmol/L   CO2 19 19 - 32 mmol/L   Glucose, Bld 109 (H) 70 - 99 mg/dL   BUN 40 (H) 6 - 23 mg/dL   Creatinine, Ser 1.96 (H) 0.50 - 1.10 mg/dL   Calcium 9.0 8.4 - 10.5 mg/dL   Total Protein 6.9 6.0 - 8.3 g/dL   Albumin 3.5 3.5 - 5.2 g/dL   AST 19 0 - 37 U/L   ALT 11 0 - 35 U/L   Alkaline Phosphatase 74 39 - 117 U/L   Total Bilirubin 1.3 (H) 0.3 - 1.2 mg/dL   GFR calc non Af Amer 23 (L) >90 mL/min   GFR calc Af Amer 27 (L) >90 mL/min    Comment: (NOTE) The eGFR has been calculated using the CKD EPI equation. This calculation has not been validated in  all clinical situations. eGFR's persistently <90 mL/min signify possible Chronic Kidney Disease.    Anion gap 11 5 - 15   Dg Chest Port 1 View  05/23/2014   CLINICAL DATA:  Fall.  Head contusion.  EXAM: PORTABLE CHEST - 1 VIEW  COMPARISON:  05/10/2014  FINDINGS: Mild enlargement of the cardiopericardial silhouette. Aorta is mildly uncoiled. No mediastinal or hilar masses. Clear lungs. No pleural effusion or pneumothorax.  Bony thorax is demineralized but grossly intact.  IMPRESSION: No acute cardiopulmonary disease.   Electronically Signed   By: Lajean Manes M.D.   On: 05/23/2014 15:58    Review of Systems  Constitutional: Negative.   Eyes: Positive for photophobia.  Respiratory: Negative.   Cardiovascular: Negative.   Gastrointestinal: Negative.   Genitourinary: Negative.   Skin: Negative.   Neurological: Positive for sensory change and focal weakness.  Endo/Heme/Allergies: Bruises/bleeds easily.  Psychiatric/Behavioral: Negative.     Blood pressure 199/83, pulse 106, temperature 98.5 F (36.9 C), temperature source Oral, resp. rate 25, SpO2 99 %. Physical Exam hent, echymosis in the head, neck chest wall. Right supraorbital hematoma. No blood in ears ar nose. Some limitation, secondary to pain,  To neck movement. Cv, nl  lungs some ronchii. Abdomen, nl, extremities, nl.  NEURO  Able to follow simple commands, left side hemiparesis arm more than leg. CN wnl. Ct from 05/19/04 shows worsening of the sbh with shift to the left.    Assessment/Plan Spoke with the daughter about her findings. She agrees with surgery asap. [name] aware of her baseline bleeding and the possibility of she getting worse with and without surgery. Aware of paralysis, unable to stop the bleeding, not able to keep the bone graft in her head or abdomninal wall.  To or asap  Teegan Guinther M 05/23/2014, 4:52 PM

## 2014-05-23 NOTE — Progress Notes (Signed)
Patients urine output 26cc in 2 hours, milky/yellow tinted. Notified Renae FicklePaul, NP who was on the unit at the time. Told to continue to monitor. No new orders received. Patient with baseline renal insufficiency, but not currently receiving dialysis. [name], [place] Marie, RN

## 2014-05-23 NOTE — ED Provider Notes (Signed)
CSN: [id number]     Arrival date & time 05/23/14  1454 History   First MD Initiated Contact with Patient 05/23/14 1522     Chief Complaint  Patient presents with  . Headache     (Consider location/radiation/quality/duration/timing/severity/associated sxs/prior Treatment) Patient is a 81 y.o. female presenting with headaches. The history is provided by a relative. No language interpreter was used.  Headache [name] is a 81 y/o F with PMHx of HTN, chronic headaches, diverticular disease, anemia, ITP, CKD with fistula placed in the RUE, heart murmur, presenting to the ED with worsening symptoms as per daughter. Daughter reported that her mother had a fall a week ago and stated that she endorsed a head bleed from this -daughter reported that she is no longer speaking like she normally does and no longer follow commands as well. Daughter reported that patient was seen by her primary care provider earlier this afternoon who saw the CT head without contrast performed on 05/20/2014, primary care provider reported that the symptoms and signs are getting worse and that patient needs to be seen in the ED. Patient was followed by Dr. Tressie Stalker, neurosurgeon while in the hospital when she was admitted from her fall on 05/10/2014. Daughter reported that patient is not eating as much that she normally does. Reported that patient has not been complaining of headache, chest pain, difficulty breathing. Daughter denied recent fall. Patient currently lives with son. PCP Dr. Doristine Counter  Past Medical History  Diagnosis Date  . Hypertension   . Anxiety and depression   . Chronic headaches   . Diverticular disease   . Anemia   . Arthritis     osteoarthritis  . Osteoporosis   . Vitamin B 12 deficiency   . History of ITP   . Anxiety     takes xanax for sleep  . Diabetes mellitus     NIDDM x 12 years  . Heart murmur     benign;asymptomatic  . Chronic kidney disease     ESRD - AV fistula in LUE (Dr.  Carlean Jews = nephrologist)  . Left knee pain 02/08/2013  . Cachexia 02/08/2013  . Hyperlipidemia   . Tremors of nervous system   . Constipated 09/30/2013  . Left ventricular dysfunction    Past Surgical History  Procedure Laterality Date  . Av fistula placement  11/15/10    left upper arm AVF  . Abdominal hysterectomy  1973  . Joint replacement  2007    rt hip  . Eye surgery    . Cataract extraction w/ intraocular lens  implant, bilateral  2002  . Vascular surgery  10-2010    left AVF  . Hernia repair  2000    ventral  . Ventral hernia repair  08/06/2011    Procedure: HERNIA REPAIR VENTRAL ADULT;  Surgeon: Liz Malady, MD;  Location: [facility] OR;  Service: General;  Laterality: N/A;  Open Repair [employer] Hernia with Mesh  . Cholecystectomy  yrs ago  . Total hip arthroplasty Left 04/27/2013    Procedure: LEFT TOTAL HIP ARTHROPLASTY ANTERIOR APPROACH;  Surgeon: Shelda Pal, MD;  Location: [facility] ORS;  Service: Orthopedics;  Laterality: Left;   Family History  Problem Relation Age of Onset  . Stroke Mother   . Heart disease Father    History  Substance Use Topics  . Smoking status: Never Smoker   . Smokeless tobacco: Current User    Types: Snuff  . Alcohol Use: No  OB History    No data available     Review of Systems  Unable to perform ROS: Mental status change  Neurological: Positive for headaches.      Allergies  Other  Home Medications   Prior to Admission medications   Medication Sig Start Date End Date Taking? Authorizing Provider  acetaminophen (TYLENOL) 325 MG tablet Take 650 mg by mouth every 6 (six) hours as needed for headache.   Yes Historical Provider, MD  atorvastatin (LIPITOR) 20 MG tablet Take 20 mg by mouth at bedtime. daily 01/08/12  Yes Historical Provider, MD  Cholecalciferol (VITAMIN D-3) 1000 UNITS CAPS Take 1,000 Units by mouth daily.   Yes Historical Provider, MD  cloNIDine (CATAPRES) 0.1 MG tablet Take 0.1 mg by mouth 3 (three)  times daily.  04/03/14  Yes Historical Provider, MD  [employer] VITAMIN B12 1000 MCG tablet TAKE 1 TABLET (1,000 MCG TOTAL) BY MOUTH DAILY. 03/30/14  Yes Artis Delay, MD  docusate sodium (COLACE) 100 MG capsule Take 100 mg by mouth daily as needed for mild constipation.   Yes Historical Provider, MD  furosemide (LASIX) 20 MG tablet Take 20 mg by mouth daily. Can take second dose as needed for edema   Yes Historical Provider, MD  hydrALAZINE (APRESOLINE) 50 MG tablet Take 1.5 tablets (75 mg total) by mouth 3 (three) times daily. 03/22/14  Yes Dwana Melena, PA-C  isosorbide mononitrate (IMDUR) 60 MG 24 hr tablet Take 1 tablet (60 mg total) by mouth daily. 02/15/14  Yes Ripudeep Jenna Luo, MD  [place] [address] 15 GM/60ML suspension Take 15 g by mouth every Monday, Wednesday, and Friday.  03/24/14  Yes Historical Provider, MD  levETIRAcetam (KEPPRA) 250 MG tablet Take 1 tablet (250 mg total) by mouth 2 (two) times daily. Please take 2 tablets oral twice daily (500 mg oral twice daily), for the next 3 days ( till 02/19/14) , then resume 1 tablet oral (250 mg) twice daily from 12/27 thereafter. Patient taking differently: Take 250 mg by mouth 2 (two) times daily.  02/17/14  Yes Starleen Arms, MD  megestrol (MEGACE) 40 MG tablet Take 40 mg by mouth 2 (two) times daily.   Yes Historical Provider, MD  metoprolol succinate (TOPROL-XL) 50 MG 24 hr tablet Take 1 tablet (50 mg total) by mouth 2 (two) times daily. Take with or immediately following a meal. 02/15/14  Yes Ripudeep K Rai, MD  mirtazapine (REMERON) 15 MG tablet Take 15 mg by mouth at bedtime.  09/20/13  Yes Historical Provider, MD  oxybutynin (DITROPAN-XL) 5 MG 24 hr tablet Take 5 mg by mouth daily.   Yes Historical Provider, MD  senna (SENOKOT) 8.6 MG TABS tablet Take 2 tablets (17.2 mg total) by mouth 2 (two) times daily. 09/30/13  Yes Artis Delay, MD  tiZANidine (ZANAFLEX) 4 MG tablet Take 1 tablet (4 mg total) by mouth every 6 (six) hours as needed for muscle spasms. 04/29/13   Yes Matthew Babish, PA-C  cephALEXin (KEFLEX) 500 MG capsule Take 1 capsule (500 mg total) by mouth 2 (two) times daily. 05/14/14   Kathlen Mody, MD   BP 199/83 mmHg  Pulse 106  Temp(Src) 98.5 F (36.9 C) (Oral)  Resp 25  SpO2 99% Physical Exam  Constitutional: No distress.  Frail appearing elderly female, thin  HENT:  Head: Normocephalic and atraumatic.  Healing ecchymosis noted to the face and chest wall Negative active bleeding Hematoma noted to the right eyebrow without bleeding - scab noted  Eyes: Conjunctivae  and EOM are normal. Pupils are equal, round, and reactive to light. Right eye exhibits no discharge. Left eye exhibits no discharge.  Neck: Normal range of motion. Neck supple.  Cardiovascular: Normal rate, regular rhythm and normal heart sounds.   Pulses:      Radial pulses are 2+ on the right side, and 2+ on the left side.       Dorsalis pedis pulses are 2+ on the right side, and 2+ on the left side.  Cap refill < 3 seconds  Fistula palpated in the right upper extremity   Pulmonary/Chest: Effort normal and breath sounds normal. No respiratory distress. She has no wheezes. She has no rales.  Negative stridor Negative use of accessory muscles  Musculoskeletal:  Decreased ROM to the upper and lower extremities bilaterally   Neurological: [name] alert.  Patient does not follow commands well  Patient does not respond to questions Patient able to stick tongue out Patient squeezes the right hand, but does not squeeze the left hand  Patient able to wiggle toes  Skin: Skin is warm and dry. No rash noted. [name] not diaphoretic. No erythema.  Psychiatric: She has a normal mood and affect. Her behavior is normal. Thought content normal.  Nursing note and vitals reviewed.   ED Course  Procedures (including critical care time)  Results for orders placed or performed during the hospital encounter of 05/23/14  CBC with Differential/Platelet  Result Value Ref Range   WBC  10.4 4.0 - 10.5 K/uL   RBC 3.60 (L) 3.87 - 5.11 MIL/uL   Hemoglobin 10.3 (L) 12.0 - 15.0 g/dL   HCT 16.1 (L) 09.6 - 04.5 %   MCV 90.0 78.0 - 100.0 fL   MCH 28.6 26.0 - 34.0 pg   MCHC 31.8 30.0 - 36.0 g/dL   RDW 40.9 81.1 - 91.4 %   Platelets 259 150 - 400 K/uL   Neutrophils Relative % 67 43 - 77 %   Neutro Abs 7.1 1.7 - 7.7 K/uL   Lymphocytes Relative 19 12 - 46 %   Lymphs Abs 1.9 0.7 - 4.0 K/uL   Monocytes Relative 13 (H) 3 - 12 %   Monocytes Absolute 1.3 (H) 0.1 - 1.0 K/uL   Eosinophils Relative 1 0 - 5 %   Eosinophils Absolute 0.1 0.0 - 0.7 K/uL   Basophils Relative 0 0 - 1 %   Basophils Absolute 0.0 0.0 - 0.1 K/uL  Comprehensive metabolic panel  Result Value Ref Range   Sodium 135 135 - 145 mmol/L   Potassium 4.9 3.5 - 5.1 mmol/L   Chloride 105 96 - 112 mmol/L   CO2 19 19 - 32 mmol/L   Glucose, Bld 109 (H) 70 - 99 mg/dL   BUN 40 (H) 6 - 23 mg/dL   Creatinine, Ser 7.82 (H) 0.50 - 1.10 mg/dL   Calcium 9.0 8.4 - 95.6 mg/dL   Total Protein 6.9 6.0 - 8.3 g/dL   Albumin 3.5 3.5 - 5.2 g/dL   AST 19 0 - 37 U/L   ALT 11 0 - 35 U/L   Alkaline Phosphatase 74 39 - 117 U/L   Total Bilirubin 1.3 (H) 0.3 - 1.2 mg/dL   GFR calc non Af Amer 23 (L) >90 mL/min   GFR calc Af Amer 27 (L) >90 mL/min   Anion gap 11 5 - 15     Ct Head Wo Contrast  05/20/2014   CLINICAL DATA:  Followup subdural hematoma. Patient fell at  home with trauma to the right side of the head on 05/10/2014  EXAM: CT HEAD WITHOUT CONTRAST  TECHNIQUE: Contiguous axial images were obtained from the base of the skull through the vertex without intravenous contrast.  COMPARISON:  05/11/2014  FINDINGS: There is no evidence of new be injury or new acute bleeding. Hematoma in the right supraorbital soft tissues persists. Subdural fluid and blood collection along the convexity on the right is larger. Blood products are becoming less dense, but low-density fluid has accumulated to a maximal thickness of 19 mm in the right frontal  region. This results in more mass effect with right-to-left shift of 7 mm as opposed 4 mm previously. Subdural blood along the superior surface of the tentorium on the right has not increased, only a few mm in thickness. Subdural blood along the right side of the falx is becoming less dense, maximal thickness measured at 10 mm. No evidence of intraparenchymal hemorrhage. No obstructive hydrocephalus. Chronic small vessel ischemic changes are seen in the deep white matter. No acute infarction.  IMPRESSION: Increasing size of subdural fluid collection along the convexity on the right, maximal thickness 19 mm in the frontal region. There does not appear to be any new hyperdense blood, but primarily the accumulation of lower density fluid. Increased mass effect with right-to-left shift of 7 mm today as opposed 4 mm previously. No other significant change.  These results will be called to the ordering clinician or representative by the Radiologist Assistant, and communication documented in the PACS or zVision Dashboard.   Electronically Signed   By: Paulina Fusi M.D.   On: 05/20/2014 13:28   Ct Head Wo Contrast  05/11/2014   CLINICAL DATA:  Follow-up right subdural hematoma  EXAM: CT HEAD WITHOUT CONTRAST  TECHNIQUE: Contiguous axial images were obtained from the base of the skull through the vertex without intravenous contrast.  COMPARISON:  05/10/2014  FINDINGS: The right subdural hematoma appears slightly smaller than yesterday, measuring 8.2 mm in maximum thickness but extending over a smaller portion of the right cerebral surface. The inter hemispheric subdural hematoma appears a little larger, measuring 13 mm in maximum thickness in extending from anterior to posterior within the interhemispheric fissure. Also continues caudally and layers across the tentorium on the right. There is a mild degree of right to left midline shift, about 4 mm. No intraventricular hemorrhage or intraparenchymal hemorrhage is evident.  Basal cisterns remain patent. Moderate atrophy and chronic white matter hypodensity is present, likely small vessel disease no acute bony abnormalities are evident. Marked right frontal and periorbital soft tissue thickening again evident.  IMPRESSION: The inter hemispheric subdural hematoma appears a little larger while the right subdural hematoma appears a little smaller. Overall there is no significant change in mass effect or midline shift.   Electronically Signed   By: Ellery Plunk M.D.   On: 05/11/2014 06:24   Ct Head Wo Contrast  05/10/2014   CLINICAL DATA:  Larey Seat at home this morning  EXAM: CT HEAD WITHOUT CONTRAST  CT MAXILLOFACIAL WITHOUT CONTRAST  CT CERVICAL SPINE WITHOUT CONTRAST  TECHNIQUE: Multidetector CT imaging of the head, cervical spine, and maxillofacial structures were performed using the standard protocol without intravenous contrast. Multiplanar CT image reconstructions of the cervical spine and maxillofacial structures were also generated.  COMPARISON:  02/16/2014  FINDINGS: CT HEAD FINDINGS  There is a large right frontal and temporal subdural hematoma. Maximal thickness measures 9 mm. There is a right to left midline shift measuring 5  mm. There also is subdural blood in the interhemispheric fissure, measuring up to 10 mm thickness. There is mild mass effect on the right lateral ventricle. Basal cisterns remain patent. There is moderate generalized atrophy and periventricular hypodensity consistent with chronic microvascular ischemic disease. No calvarial or skullbase fracture is evident. There is a large right frontal scalp hematoma.  CT MAXILLOFACIAL FINDINGS  No facial or periorbital fracture is evident. Orbital floors are intact. Zygomatic arches are intact. Pterygoid plates are intact. Mandible and TMJ intact. The large periorbital and right frontal scalp hematoma is again noted.  CT CERVICAL SPINE FINDINGS  The cervical vertebrae are normal in height. There is kyphosis centered  at C5. There are moderately severe degenerative disc changes at C5-6. The vertebral column, pedicles and facet articulations are intact, with no evidence of acute fracture. No acute soft tissue abnormality is evident.  There is degenerative central canal stenosis, greatest at C5-6 and C6-7.  IMPRESSION: 1. Large right frontal-temporal subdural hematoma with 5 mm right to left midline shift. There also is acute subdural blood in the interhemispheric fissure. 2. Moderate atrophy and chronic microvascular ischemic disease 3. Large right periorbital and right frontal scalp hematoma 4. No evidence of acute fracture involving the calvarium, skullbase, maxillofacial region or cervical spine 5. Degenerative central canal stenosis in the cervical spine at C5-6 and C6-7 6. Critical Value/emergent results were called by telephone at the time of interpretation on 05/10/2014 at 5:12 am to Dr. Marisa Severin , who verbally acknowledged these results.   Electronically Signed   By: Ellery Plunk M.D.   On: 05/10/2014 05:12   Ct Cervical Spine Wo Contrast  05/10/2014   CLINICAL DATA:  Larey Seat at home this morning  EXAM: CT HEAD WITHOUT CONTRAST  CT MAXILLOFACIAL WITHOUT CONTRAST  CT CERVICAL SPINE WITHOUT CONTRAST  TECHNIQUE: Multidetector CT imaging of the head, cervical spine, and maxillofacial structures were performed using the standard protocol without intravenous contrast. Multiplanar CT image reconstructions of the cervical spine and maxillofacial structures were also generated.  COMPARISON:  02/16/2014  FINDINGS: CT HEAD FINDINGS  There is a large right frontal and temporal subdural hematoma. Maximal thickness measures 9 mm. There is a right to left midline shift measuring 5 mm. There also is subdural blood in the interhemispheric fissure, measuring up to 10 mm thickness. There is mild mass effect on the right lateral ventricle. Basal cisterns remain patent. There is moderate generalized atrophy and periventricular  hypodensity consistent with chronic microvascular ischemic disease. No calvarial or skullbase fracture is evident. There is a large right frontal scalp hematoma.  CT MAXILLOFACIAL FINDINGS  No facial or periorbital fracture is evident. Orbital floors are intact. Zygomatic arches are intact. Pterygoid plates are intact. Mandible and TMJ intact. The large periorbital and right frontal scalp hematoma is again noted.  CT CERVICAL SPINE FINDINGS  The cervical vertebrae are normal in height. There is kyphosis centered at C5. There are moderately severe degenerative disc changes at C5-6. The vertebral column, pedicles and facet articulations are intact, with no evidence of acute fracture. No acute soft tissue abnormality is evident.  There is degenerative central canal stenosis, greatest at C5-6 and C6-7.  IMPRESSION: 1. Large right frontal-temporal subdural hematoma with 5 mm right to left midline shift. There also is acute subdural blood in the interhemispheric fissure. 2. Moderate atrophy and chronic microvascular ischemic disease 3. Large right periorbital and right frontal scalp hematoma 4. No evidence of acute fracture involving the calvarium, skullbase, maxillofacial region or  cervical spine 5. Degenerative central canal stenosis in the cervical spine at C5-6 and C6-7 6. Critical Value/emergent results were called by telephone at the time of interpretation on 05/10/2014 at 5:12 am to Dr. Marisa SeverinLGA OTTER , who verbally acknowledged these results.   Electronically Signed   By: Ellery Plunkaniel R Mitchell M.D.   On: 05/10/2014 05:12   Dg Chest Port 1 View  05/23/2014   CLINICAL DATA:  Fall.  Head contusion.  EXAM: PORTABLE CHEST - 1 VIEW  COMPARISON:  05/10/2014  FINDINGS: Mild enlargement of the cardiopericardial silhouette. Aorta is mildly uncoiled. No mediastinal or hilar masses. Clear lungs. No pleural effusion or pneumothorax.  Bony thorax is demineralized but grossly intact.  IMPRESSION: No acute cardiopulmonary disease.    Electronically Signed   By: Amie Portlandavid  Ormond M.D.   On: 05/23/2014 15:58   Dg Chest Port 1 View  05/10/2014   CLINICAL DATA:  Larey SeatFell today in the bathroom. Facial trauma. History of hypertension, anxiety, depression, anemia, diabetes.  EXAM: PORTABLE CHEST - 1 VIEW  COMPARISON:  02/12/2014  FINDINGS: Heart is mildly enlarged. Shallow lung inflation. There are no focal consolidations or pleural effusions. No overt edema.  IMPRESSION: 1. Stable, mild cardiomegaly. 2. Shallow lung inflation.   Electronically Signed   By: Norva PavlovElizabeth  Brown M.D.   On: 05/10/2014 15:46   Dg Hip Unilat With Pelvis 2-3 Views Right  05/10/2014   CLINICAL DATA:  Larey SeatFell this morning  EXAM: RIGHT HIP (WITH PELVIS) 2-3 VIEWS  COMPARISON:  04/27/2013  FINDINGS: There is a greater trochanter fracture, nondisplaced. The total hip arthroplasty hardware appears intact. There is no evidence of dislocation.  IMPRESSION: Acute fracture of the right greater trochanter   Electronically Signed   By: Ellery Plunkaniel R Mitchell M.D.   On: 05/10/2014 06:16   Ct Maxillofacial Wo Cm  05/10/2014   CLINICAL DATA:  Larey SeatFell at home this morning  EXAM: CT HEAD WITHOUT CONTRAST  CT MAXILLOFACIAL WITHOUT CONTRAST  CT CERVICAL SPINE WITHOUT CONTRAST  TECHNIQUE: Multidetector CT imaging of the head, cervical spine, and maxillofacial structures were performed using the standard protocol without intravenous contrast. Multiplanar CT image reconstructions of the cervical spine and maxillofacial structures were also generated.  COMPARISON:  02/16/2014  FINDINGS: CT HEAD FINDINGS  There is a large right frontal and temporal subdural hematoma. Maximal thickness measures 9 mm. There is a right to left midline shift measuring 5 mm. There also is subdural blood in the interhemispheric fissure, measuring up to 10 mm thickness. There is mild mass effect on the right lateral ventricle. Basal cisterns remain patent. There is moderate generalized atrophy and periventricular hypodensity  consistent with chronic microvascular ischemic disease. No calvarial or skullbase fracture is evident. There is a large right frontal scalp hematoma.  CT MAXILLOFACIAL FINDINGS  No facial or periorbital fracture is evident. Orbital floors are intact. Zygomatic arches are intact. Pterygoid plates are intact. Mandible and TMJ intact. The large periorbital and right frontal scalp hematoma is again noted.  CT CERVICAL SPINE FINDINGS  The cervical vertebrae are normal in height. There is kyphosis centered at C5. There are moderately severe degenerative disc changes at C5-6. The vertebral column, pedicles and facet articulations are intact, with no evidence of acute fracture. No acute soft tissue abnormality is evident.  There is degenerative central canal stenosis, greatest at C5-6 and C6-7.  IMPRESSION: 1. Large right frontal-temporal subdural hematoma with 5 mm right to left midline shift. There also is acute subdural blood in the interhemispheric fissure.  2. Moderate atrophy and chronic microvascular ischemic disease 3. Large right periorbital and right frontal scalp hematoma 4. No evidence of acute fracture involving the calvarium, skullbase, maxillofacial region or cervical spine 5. Degenerative central canal stenosis in the cervical spine at C5-6 and C6-7 6. Critical Value/emergent results were called by telephone at the time of interpretation on 05/10/2014 at 5:12 am to Dr. Marisa Severin , who verbally acknowledged these results.   Electronically Signed   By: Ellery Plunk M.D.   On: 05/10/2014 05:12    Labs Review Labs Reviewed  CBC WITH DIFFERENTIAL/PLATELET - Abnormal; Notable for the following:    RBC 3.60 (*)    Hemoglobin 10.3 (*)    HCT 32.4 (*)    Monocytes Relative 13 (*)    Monocytes Absolute 1.3 (*)    All other components within normal limits  COMPREHENSIVE METABOLIC PANEL - Abnormal; Notable for the following:    Glucose, Bld 109 (*)    BUN 40 (*)    Creatinine, Ser 1.96 (*)    Total  Bilirubin 1.3 (*)    GFR calc non Af Amer 23 (*)    GFR calc Af Amer 27 (*)    All other components within normal limits    Imaging Review Dg Chest Port 1 View  05/23/2014   CLINICAL DATA:  Fall.  Head contusion.  EXAM: PORTABLE CHEST - 1 VIEW  COMPARISON:  05/10/2014  FINDINGS: Mild enlargement of the cardiopericardial silhouette. Aorta is mildly uncoiled. No mediastinal or hilar masses. Clear lungs. No pleural effusion or pneumothorax.  Bony thorax is demineralized but grossly intact.  IMPRESSION: No acute cardiopulmonary disease.   Electronically Signed   By: Amie Portland M.D.   On: 05/23/2014 15:58     EKG Interpretation None       3:59 PM This provider spoke with Dr. Jeral Fruit, neurosurgeon. Discussed case in great detail. Reviewed labs. Recommended that patient be admitted to neuro ICU and the patient will get surgery performed later this afternoon. Physician was aware of the patient and knew what was going on with the patient, physician was familiar with patient and situation.  4:49 PM Dr. Jeral Fruit in the ED, physician to take patient to the OR with an approximately 45 minutes, as per physician.  MDM   Final diagnoses:  Subdural hematoma    Medications  labetalol (NORMODYNE,TRANDATE) injection 20 mg (not administered)    Filed Vitals:   05/23/14 1547 05/23/14 1600 05/23/14 1615 05/23/14 1645  BP: 195/75 194/76 197/84 199/83  Pulse:  89 100 106  Temp: 98.5 F (36.9 C)     TempSrc: Oral     Resp: 26 21 22 25   SpO2: 99% 99% 100% 99%   This provider reviewed patient's chart. Patient was seen and assessed in ED setting on 05/10/2014 where patient had a fall. Imaging identified a small acute fracture of the left hip. CT head without contrast identified a subdural hematoma. Repeat CT head without contrast was performed on 05/20/2014 that showed an increased size of the subdural hematoma measuring approximately 19 mm with increased mass effect from right to left shift measuring  approximately 7 mm, has increased from 4 mm. Dr. Jeral Fruit scheduled a craniotomy for 05/24/2014. CBC negative elevated leukocytosis. Hemoglobin 10.3, hematocrit 32.4. When compared to 4 days ago, patient's hemoglobin has increased from 8.1. Patient's platelets have improved-259 today. Approximately 9 days ago patient's platelet level was 86. CMP noted elevated BUN of 40 and creatinine of 1.96 -  when compared to previous labs patient's creatinine has been as high as 2.73, patient has history of chronic kidney disease and currently has a fistula patiently awaiting dialysis. Chest x-ray no acute abnormalities identified. Patient presenting to the ED with worsening symptoms and worsening increased bleed from subdural hematoma that was first identified on 05/10/2014. CT head without contrast was performed as an outpatient on 05/20/2014 showed increased subdural hematoma measuring approximately 19 mm and mass effect has increased from 4 mm to 7 mm. Dr. Jeral Fruit, neurosurgeon, has been consulted-physician is aware of the patient and knows current situation. As per physician, recommended patient to be admitted to the neuro ICU and a craniotomy will be performed today rather than tomorrow. Patient seen and assessed by attending physician, Dr. Criss Alvine. Patient breathing on her own without difficulty - does not need intubation at this time. Dr. Ellwood Handler at bedside, neurosurgeon, to take patient to the OR today.  Raymon Mutton, PA-C 05/23/14 [address], PA-C 05/23/14 1656  Pricilla Loveless, MD 05/26/14 [phone]

## 2014-05-23 NOTE — Anesthesia Postprocedure Evaluation (Signed)
  Anesthesia Post-op Note  Patient: [name]  Procedure(s) Performed: Procedure(s) with comments: CRANIOTOMY HEMATOMA EVACUATION SUBDURAL (N/A) - CRANIOTOMY HEMATOMA EVACUATION SUBDURAL  Patient Location: NICU  Anesthesia Type:General  Level of Consciousness: sedated and Patient remains intubated per anesthesia plan  Airway and Oxygen Therapy: Patient remains intubated per anesthesia plan and Patient placed on Ventilator (see vital sign flow sheet for setting)  Post-op Pain: none  Post-op Assessment: Post-op Vital signs reviewed, Patient's Cardiovascular Status Stable, Respiratory Function Stable, Patent Airway, No signs of Nausea or vomiting and Pain level controlled  Post-op Vital Signs: stable  Last Vitals:  Filed Vitals:   05/23/14 2013  BP: 103/45  Pulse: 86  Temp:   Resp:     Complications: No apparent anesthesia complications

## 2014-05-23 NOTE — Anesthesia Preprocedure Evaluation (Addendum)
Anesthesia Evaluation  Patient identified by MRN, date of birth, ID band Patient awake    Reviewed: Allergy & Precautions, H&P , NPO status , Patient's Chart, lab work & pertinent test results  Airway Mallampati: I  TM Distance: >3 FB Neck ROM: full    Dental  (+) Edentulous Upper, Edentulous Lower   Pulmonary pneumonia -,  breath sounds clear to auscultation  + decreased breath sounds      Cardiovascular hypertension, Pt. on home beta blockers and Pt. on medications + Past MI and +CHF + Valvular Problems/Murmurs Rhythm:regular Rate:Normal  MPS 03/10/14 Impression Exercise Capacity: Lexiscan with no exercise. BP Response: Normal blood pressure response. Clinical Symptoms: No significant symptoms noted. ECG Impression: No significant ECG changes with Lexiscan. Comparison with Prior Nuclear Study: No images to compare  Overall Impression: Intermediate risk stress nuclear study without reversible ischemia.  Echo 01/2014 - Left ventricle: The cavity size was normal. Systolic function wasmoderately reduced. The estimated ejection fraction was in therange of 35% to 40%. There is moderate hypokinesis of theentireinferior myocardium. There is mild hypokinesis of theentireanteroseptal myocardium. Doppler parameters are consistentwith abnormal left ventricular relaxation (grade 1 diastolicdysfunction). - Mitral valve: Calcified annulus. There was moderate regurgitation. - Left atrium: The atrium was moderately dilated. - Right ventricle: Systolic function was mildly reduced. - Right atrium: The atrium was mildly dilated. - Tricuspid valve: There was moderate regurgitation. - Pulmonary arteries: Systolic pressure was mildly to moderatelyincreased. PA peak pressure: 38 mm Hg (S). - Pericardium, extracardiac: There was a left pleural effusion andconsolidation of left lower lung.    Neuro/Psych  Headaches, Seizures -,   PSYCHIATRIC DISORDERS Anxiety    GI/Hepatic negative GI ROS,   Endo/Other  diabetes  Renal/GU CRF and Renal InsufficiencyRenal disease     Musculoskeletal  (+) Arthritis -,   Abdominal   Peds  Hematology  (+) anemia ,   Anesthesia Other Findings   Reproductive/Obstetrics                         Anesthesia Physical  Anesthesia Plan  ASA: IV and emergent  Anesthesia Plan: General   Post-op Pain Management:    Induction: Intravenous and Rapid sequence  Airway Management Planned: Oral ETT  Additional Equipment: None and Arterial line  Intra-op Plan:   Post-operative Plan: Possible Post-op intubation/ventilation  Informed Consent: I have reviewed the patients History and Physical, chart, labs and discussed the procedure including the risks, benefits and alternatives for the proposed anesthesia with the patient or authorized representative who has indicated his/her understanding and acceptance.   Dental advisory given  Plan Discussed with: CRNA  Anesthesia Plan Comments: (Pt lethargic but following commands  Plan Art line and central line,)     Anesthesia Quick Evaluation

## 2014-05-23 NOTE — Consult Note (Signed)
PULMONARY / CRITICAL CARE MEDICINE   Name: [name] MRN: [id number] DOB: Mar 08, 1932    ADMISSION DATE:  05/23/2014 CONSULTATION DATE:  05/22/2013  REFERRING MD :  Dr. Jeral Fruit  CHIEF COMPLAINT:  SDH  INITIAL PRESENTATION: 81 year old female with   STUDIES:  3/25 CT head > Increasing size of subdural fluid collection along the convexity on the right, maximal thickness 19 mm in the frontal region. Increased mass effect with right-to-left shift of 7 mm today as opposed 4 mm previously. No other significant change.  SIGNIFICANT EVENTS: 3/15 > 3/19 admitted s/p fall for SDH, managed medically.   HISTORY OF PRESENT ILLNESS:  81 year old female with recent history of SDH s/p fall managed nonoperatively 3/15. Other significant PMH includes HTN, DM, and CKD. She presented again to [facility] ED 3/28. She was seen by PCP 3/25 with reports of changes in mental status. Head CT was repeated and noted to be worse. She was referred to ED and was readmitted for subacute on chronic SDH under neurosurgery. She was taken to OR for CRANIOTOMY HEMATOMA EVACUATION SUBDURAL. Post operatively she was taken to OR on ventilator. PCCM to assist with vent/medical management.  PAST MEDICAL HISTORY :   has a past medical history of Hypertension; Anxiety and depression; Chronic headaches; Diverticular disease; Anemia; Arthritis; Osteoporosis; Vitamin B 12 deficiency; History of ITP; Anxiety; Diabetes mellitus; Heart murmur; Chronic kidney disease; Left knee pain (02/08/2013); Cachexia (02/08/2013); Hyperlipidemia; Tremors of nervous system; Constipated (09/30/2013); and Left ventricular dysfunction.  has past surgical history that includes AV fistula placement (11/15/10); Abdominal hysterectomy (1973); Joint replacement (2007); Eye surgery; Cataract extraction w/ intraocular lens  implant, bilateral (2002); Vascular surgery (10-2010); Hernia repair (2000); Ventral hernia repair (08/06/2011); Cholecystectomy (yrs ago); and Total hip  arthroplasty (Left, 04/27/2013). Prior to Admission medications   Medication Sig Start Date End Date Taking? Authorizing Provider  acetaminophen (TYLENOL) 325 MG tablet Take 650 mg by mouth every 6 (six) hours as needed for headache.   Yes Historical Provider, MD  atorvastatin (LIPITOR) 20 MG tablet Take 20 mg by mouth at bedtime. daily 01/08/12  Yes Historical Provider, MD  Cholecalciferol (VITAMIN D-3) 1000 UNITS CAPS Take 1,000 Units by mouth daily.   Yes Historical Provider, MD  cloNIDine (CATAPRES) 0.1 MG tablet Take 0.1 mg by mouth 3 (three) times daily.  04/03/14  Yes Historical Provider, MD  [employer] VITAMIN B12 1000 MCG tablet TAKE 1 TABLET (1,000 MCG TOTAL) BY MOUTH DAILY. 03/30/14  Yes Artis Delay, MD  docusate sodium (COLACE) 100 MG capsule Take 100 mg by mouth daily as needed for mild constipation.   Yes Historical Provider, MD  furosemide (LASIX) 20 MG tablet Take 20 mg by mouth daily. Can take second dose as needed for edema   Yes Historical Provider, MD  hydrALAZINE (APRESOLINE) 50 MG tablet Take 1.5 tablets (75 mg total) by mouth 3 (three) times daily. 03/22/14  Yes Dwana Melena, PA-C  isosorbide mononitrate (IMDUR) 60 MG 24 hr tablet Take 1 tablet (60 mg total) by mouth daily. 02/15/14  Yes Ripudeep Jenna Luo, MD  [place] [address] 15 GM/60ML suspension Take 15 g by mouth every Monday, Wednesday, and Friday.  03/24/14  Yes Historical Provider, MD  levETIRAcetam (KEPPRA) 250 MG tablet Take 1 tablet (250 mg total) by mouth 2 (two) times daily. Please take 2 tablets oral twice daily (500 mg oral twice daily), for the next 3 days ( till 02/19/14) , then resume 1 tablet oral (250 mg) twice  daily from 12/27 thereafter. Patient taking differently: Take 250 mg by mouth 2 (two) times daily.  02/17/14  Yes Starleen Arms, MD  megestrol (MEGACE) 40 MG tablet Take 40 mg by mouth 2 (two) times daily.   Yes Historical Provider, MD  metoprolol succinate (TOPROL-XL) 50 MG 24 hr tablet Take 1 tablet (50 mg total) by mouth  2 (two) times daily. Take with or immediately following a meal. 02/15/14  Yes Ripudeep K Rai, MD  mirtazapine (REMERON) 15 MG tablet Take 15 mg by mouth at bedtime.  09/20/13  Yes Historical Provider, MD  oxybutynin (DITROPAN-XL) 5 MG 24 hr tablet Take 5 mg by mouth daily.   Yes Historical Provider, MD  senna (SENOKOT) 8.6 MG TABS tablet Take 2 tablets (17.2 mg total) by mouth 2 (two) times daily. 09/30/13  Yes Artis Delay, MD  tiZANidine (ZANAFLEX) 4 MG tablet Take 1 tablet (4 mg total) by mouth every 6 (six) hours as needed for muscle spasms. 04/29/13  Yes Matthew Babish, PA-C  cephALEXin (KEFLEX) 500 MG capsule Take 1 capsule (500 mg total) by mouth 2 (two) times daily. 05/14/14   Kathlen Mody, MD   Allergies  Allergen Reactions  . Other Other (See Comments)    No Narcotics meds-ONLY TYLENOLper family request    FAMILY HISTORY:  indicated that her mother is deceased. She indicated that her father is deceased. She indicated that both of her sisters are alive.  SOCIAL HISTORY:  reports that she has never smoked. Her smokeless tobacco use includes Snuff. She reports that she does not drink alcohol or use illicit drugs.  REVIEW OF SYSTEMS:  Unable, intubated  SUBJECTIVE:   VITAL SIGNS: Temp:  [98.5 F (36.9 C)] 98.5 F (36.9 C) (03/28 1547) Pulse Rate:  [86-106] 86 (03/28 2013) Resp:  [18-26] 18 (03/28 1715) BP: (103-199)/(45-93) 103/45 mmHg (03/28 2013) SpO2:  [97 %-100 %] 98 % (03/28 1715) FiO2 (%):  [40 %] 40 % (03/28 2013) HEMODYNAMICS:   VENTILATOR SETTINGS: Vent Mode:  [-] PRVC FiO2 (%):  [40 %] 40 % Set Rate:  [14 bmp] 14 bmp Vt Set:  [420 mL] 420 mL PEEP:  [5 cmH20] 5 cmH20 Plateau Pressure:  [15 cmH20] 15 cmH20 INTAKE / OUTPUT:  Intake/Output Summary (Last 24 hours) at 05/23/14 2030 Last data filed at 05/23/14 2010  Gross per 24 hour  Intake   1000 ml  Output    225 ml  Net    775 ml    PHYSICAL EXAMINATION: General:  Thin elderly female in NAD on vent Neuro:   Sedated RASS -2, PERRL HEENT:  Surgical dressing in place over R cranium Cardiovascular:  [place]/AT, no MRG Lungs:  Clear bilateral breath sounds Abdomen:  Soft, non-tender, non-distended Musculoskeletal:  No acute deformity, no edema Skin:  Grossly intact  LABS:  CBC  Recent Labs Lab 05/19/14 1308 05/23/14 1339  WBC 6.7 10.4  HGB 8.1* 10.3*  HCT 25.5* 32.4*  PLT 197 259   Coag's No results for input(s): APTT, INR in the last 168 hours. BMET  Recent Labs Lab 05/23/14 1339  NA 135  K 4.9  CL 105  CO2 19  BUN 40*  CREATININE 1.96*  GLUCOSE 109*   Electrolytes  Recent Labs Lab 05/23/14 1339  CALCIUM 9.0   Sepsis Markers No results for input(s): LATICACIDVEN, PROCALCITON, O2SATVEN in the last 168 hours. ABG No results for input(s): PHART, PCO2ART, PO2ART in the last 168 hours. Liver Enzymes  Recent Labs Lab 05/23/14  1339  AST 19  ALT 11  ALKPHOS 74  BILITOT 1.3*  ALBUMIN 3.5   Cardiac Enzymes No results for input(s): TROPONINI, PROBNP in the last 168 hours. Glucose No results for input(s): GLUCAP in the last 168 hours.  Imaging No results found.   ASSESSMENT / PLAN:  NEUROLOGIC A:   Sub-acute on chronic subdural hematoma s/p crani/evacuation Depression Anxiety  P:   RASS goal:-1 Management per neurosurgery Keppra Propofol for sedation Will push for WUA 3/29 and hopefully SBT Any further imaging, drain management per Dr Jeral FruitBotero  PULMONARY OETT 3/28 >> A: Acute respiratory failure in post-operative setting  P:   Full vent support, SBT today Follow CXR VAP bundle  CARDIOVASCULAR  A:  Hypertension  P:  Telemetry monitoring BP goals per neurosurgery PRN labetalol   RENAL A:   CKD - Creat improved from last admission  P:   Follow Bmet Replace electrolytes as needed  GASTROINTESTINAL A:   No acute issues  P:   NPO for now SUP: IV protonix  HEMATOLOGIC A:   H/o ITP > Plt on admission 259 Anemia  P:  Follow  CBC  INFECTIOUS A:   Urine with sedimentation  P:   UA Urine culture 3/29 >  Monitor clinically off abx  ENDOCRINE A:   DM  P:   CBG monitoring and SSI   FAMILY  - Updates: discussed status with daughter at bedside 3/29  - Inter-disciplinary family meet or Palliative Care meeting due by: 4/5    Joneen RoachPaul Hoffman, AGACNP-BC [facility] Pager [phone] or [phone]) [phone] 05/23/2014, 8:30 PM   Attending Note:  I have examined patient, reviewed labs, studies and notes. I have discussed the case with Henreitta LeberP Hoffman, and I agree with the data and plans as amended above. Pt has hx renal insufficiency, DM, HTN, suffered a traumatic SDH that was asymptomatic initially but which enlarged and began to cause focal sx. [name] s/p crani by Dr Jeral FruitBotero 3/28. On my eval this am she is sedated. We will plan to lighten sedation, allow PSv and assess for extubation.  Independent critical care time is 35 minutes.   Levy Pupaobert Eyden Dobie, MD, PhD 05/24/2014, 1:30 PM [facility] [phone] or if no answer [phone]

## 2014-05-23 NOTE — Anesthesia Procedure Notes (Signed)
Procedure Name: Intubation Date/Time: 05/23/2014 6:27 PM Performed by: Reine JustFLOWERS, Khaleah Duer T Pre-anesthesia Checklist: Patient identified, Emergency Drugs available, Suction available, Patient being monitored and Timeout performed Patient Re-evaluated:Patient Re-evaluated prior to inductionOxygen Delivery Method: Circle system utilized and Simple face mask Preoxygenation: Pre-oxygenation with 100% oxygen Intubation Type: IV induction Ventilation: Mask ventilation without difficulty Laryngoscope Size: Miller and 2 Grade View: Grade I Tube type: Subglottic suction tube Tube size: 8.0 mm Number of attempts: 1 Airway Equipment and Method: Patient positioned with wedge pillow and Stylet Placement Confirmation: ETT inserted through vocal cords under direct vision,  positive ETCO2 and breath sounds checked- equal and bilateral Secured at: 20 cm Tube secured with: Tape Dental Injury: Teeth and Oropharynx as per pre-operative assessment

## 2014-05-23 NOTE — Consult Note (Signed)
[facility] CONSULT NOTE  Patient Care Team: Delorse Lek, MD as PCP - General (Family Medicine) Camille Bal, MD (Nephrology)  CHIEF COMPLAINTS/PURPOSE OF CONSULTATION:  Abnormal CBC, subdural hematoma requiring surgery  HISTORY OF PRESENTING ILLNESS:  [name] 81 y.o. female is well known to me. She had back or history of chronic renal disease, poorly controlled hypertension, anemia chronic disease and mild thrombocytopenia. She has been getting darbepoetin injection through my clinic. Last week, the patient was noted to have extensive bruising. She was admitted to the hospital between 05/10/2014 to 05/14/2014 due to recurrent falls and CT scan showed subdural hematoma. She had transient thrombocytopenia noted with recent hospitalization. Today, her daughter noted the patient started to have altered mental status with confusion. No seizure activity was reported. History is not possible through the patient. The daughter felt that the patient have reduced oral intake. The patient denies any recent signs or symptoms of bleeding such as spontaneous epistaxis, hematuria or hematochezia. Repeat imaging study of the head in the emergency department from 05/20/2014 showed significant mass effect with left shift. She was seen by neurosurgery plan for surgery tomorrow. The patient had no history of liver disease, exposure to heparin, history of cardiac murmur/prior cardiovascular surgery or recent new medications MEDICAL HISTORY:  Past Medical History  Diagnosis Date  . Hypertension   . Anxiety and depression   . Chronic headaches   . Diverticular disease   . Anemia   . Arthritis     osteoarthritis  . Osteoporosis   . Vitamin B 12 deficiency   . History of ITP   . Anxiety     takes xanax for sleep  . Diabetes mellitus     NIDDM x 12 years  . Heart murmur     benign;asymptomatic  . Chronic kidney disease     ESRD - AV fistula in LUE (Dr. Carlean Jews =  nephrologist)  . Left knee pain 02/08/2013  . Cachexia 02/08/2013  . Hyperlipidemia   . Tremors of nervous system   . Constipated 09/30/2013  . Left ventricular dysfunction     SURGICAL HISTORY: Past Surgical History  Procedure Laterality Date  . Av fistula placement  11/15/10    left upper arm AVF  . Abdominal hysterectomy  1973  . Joint replacement  2007    rt hip  . Eye surgery    . Cataract extraction w/ intraocular lens  implant, bilateral  2002  . Vascular surgery  10-2010    left AVF  . Hernia repair  2000    ventral  . Ventral hernia repair  08/06/2011    Procedure: HERNIA REPAIR VENTRAL ADULT;  Surgeon: Liz Malady, MD;  Location: [facility] OR;  Service: General;  Laterality: N/A;  Open Repair REcurrent Inc. Hernia with Mesh  . Cholecystectomy  yrs ago  . Total hip arthroplasty Left 04/27/2013    Procedure: LEFT TOTAL HIP ARTHROPLASTY ANTERIOR APPROACH;  Surgeon: Shelda Pal, MD;  Location: [facility] ORS;  Service: Orthopedics;  Laterality: Left;    SOCIAL HISTORY: History   Social History  . Marital Status: Widowed    Spouse Name: N/A  . Number of Children: 5  . Years of Education: N/A   Occupational History  . Not on file.   Social History Main Topics  . Smoking status: Never Smoker   . Smokeless tobacco: Current User    Types: Snuff  . Alcohol Use: No  . Drug Use: No  .  Sexual Activity: No   Other Topics Concern  . Not on file   Social History Narrative    FAMILY HISTORY: Family History  Problem Relation Age of Onset  . Stroke Mother   . Heart disease Father     ALLERGIES:  is allergic to other.  MEDICATIONS:  No current facility-administered medications for this encounter.   Current Outpatient Prescriptions  Medication Sig Dispense Refill  . acetaminophen (TYLENOL) 325 MG tablet Take 650 mg by mouth every 6 (six) hours as needed for headache.    Marland Kitchen. atorvastatin (LIPITOR) 20 MG tablet Take 20 mg by mouth at bedtime. daily    . cephALEXin (KEFLEX)  500 MG capsule Take 1 capsule (500 mg total) by mouth 2 (two) times daily. 6 capsule 0  . Cholecalciferol (VITAMIN D-3) 1000 UNITS CAPS Take 1,000 Units by mouth daily.    . cloNIDine (CATAPRES) 0.1 MG tablet Take 0.1 mg by mouth 3 (three) times daily.     . CVS VITAMIN B12 1000 MCG tablet TAKE 1 TABLET (1,000 MCG TOTAL) BY MOUTH DAILY. 30 tablet 6  . docusate sodium (COLACE) 100 MG capsule Take 100 mg by mouth daily as needed for mild constipation.    . hydrALAZINE (APRESOLINE) 50 MG tablet Take 1.5 tablets (75 mg total) by mouth 3 (three) times daily. 90 tablet 3  . isosorbide mononitrate (IMDUR) 60 MG 24 hr tablet Take 1 tablet (60 mg total) by mouth daily. 30 tablet 3  . KIONEX 15 GM/60ML suspension Take 15 g by mouth every Monday, Wednesday, and Friday.   5  . levETIRAcetam (KEPPRA) 250 MG tablet Take 1 tablet (250 mg total) by mouth 2 (two) times daily. Please take 2 tablets oral twice daily (500 mg oral twice daily), for the next 3 days ( till 02/19/14) , then resume 1 tablet oral (250 mg) twice daily from 12/27 thereafter. (Patient taking differently: Take 250 mg by mouth 2 (two) times daily. ) 60 tablet 0  . megestrol (MEGACE) 40 MG tablet Take 40 mg by mouth 2 (two) times daily.    . metoprolol succinate (TOPROL-XL) 50 MG 24 hr tablet Take 1 tablet (50 mg total) by mouth 2 (two) times daily. Take with or immediately following a meal. 60 tablet 3  . mirtazapine (REMERON) 15 MG tablet Take 15 mg by mouth daily.    Marland Kitchen. oxybutynin (DITROPAN-XL) 5 MG 24 hr tablet Take 5 mg by mouth daily.    Marland Kitchen. senna (SENOKOT) 8.6 MG TABS tablet Take 2 tablets (17.2 mg total) by mouth 2 (two) times daily. 120 each 6  . tiZANidine (ZANAFLEX) 4 MG tablet Take 1 tablet (4 mg total) by mouth every 6 (six) hours as needed for muscle spasms. 30 tablet 0    REVIEW OF SYSTEMS:   unable to obtain   PHYSICAL EXAMINATION: ECOG PERFORMANCE STATUS: 3 - Symptomatic, >50% confined to bed  Filed Vitals:   05/23/14 1547   BP: 195/75  Pulse:   Temp: 98.5 F (36.9 C)  Resp: 26  GENERAL:alert with significant, extensive bruises throughout.  SKIN: Extensive bruises. No petechiae rash  EYES: normal, conjunctiva is pale and non-injected, sclera clear OROPHARYNX:no exudate, no erythema and lips, buccal mucosa, and tongue normal . No thrush. Dry mucous membrane is noted.  NECK: supple, thyroid normal size, non-tender, without nodularity LYMPH:  no palpable lymphadenopathy in the cervical, axillary or inguinal LUNGS: clear to auscultation and percussion with normal breathing effort HEART:  tachycardia, regular rate & rhythm  and no murmurs and no lower extremity edema ABDOMEN:abdomen soft, non-tender and normal bowel sounds Musculoskeletal:no cyanosis of digits and no clubbing  PSYCH: alert but not oriented. Unable to assess for mental status NEURO: no focal motor/sensory deficits. SHE APPEARS TO BE ABLE TO MOVE ALL LIMBS   LABORATORY DATA:  I have reviewed the data as listed Lab Results  Component Value Date   WBC 10.4 05/23/2014   HGB 10.3* 05/23/2014   HCT 32.4* 05/23/2014   MCV 90.0 05/23/2014   PLT 259 05/23/2014     RADIOGRAPHIC STUDIES: I have personally reviewed the radiological images as listed and agreed with the findings in the report. Ct Head Wo Contrast  05/20/2014   CLINICAL DATA:  Followup subdural hematoma. Patient fell at home with trauma to the right side of the head on 05/10/2014  EXAM: CT HEAD WITHOUT CONTRAST  TECHNIQUE: Contiguous axial images were obtained from the base of the skull through the vertex without intravenous contrast.  COMPARISON:  05/11/2014  FINDINGS: There is no evidence of new be injury or new acute bleeding. Hematoma in the right supraorbital soft tissues persists. Subdural fluid and blood collection along the convexity on the right is larger. Blood products are becoming less dense, but low-density fluid has accumulated to a maximal thickness of 19 mm in the right  frontal region. This results in more mass effect with right-to-left shift of 7 mm as opposed 4 mm previously. Subdural blood along the superior surface of the tentorium on the right has not increased, only a few mm in thickness. Subdural blood along the right side of the falx is becoming less dense, maximal thickness measured at 10 mm. No evidence of intraparenchymal hemorrhage. No obstructive hydrocephalus. Chronic small vessel ischemic changes are seen in the deep white matter. No acute infarction.  IMPRESSION: Increasing size of subdural fluid collection along the convexity on the right, maximal thickness 19 mm in the frontal region. There does not appear to be any new hyperdense blood, but primarily the accumulation of lower density fluid. Increased mass effect with right-to-left shift of 7 mm today as opposed 4 mm previously. No other significant change.  These results will be called to the ordering clinician or representative by the Radiologist Assistant, and communication documented in the PACS or zVision Dashboard.   Electronically Signed   By: Paulina Fusi M.D.   On: 05/20/2014 13:28   Ct Head Wo Contrast  05/11/2014   CLINICAL DATA:  Follow-up right subdural hematoma  EXAM: CT HEAD WITHOUT CONTRAST  TECHNIQUE: Contiguous axial images were obtained from the base of the skull through the vertex without intravenous contrast.  COMPARISON:  05/10/2014  FINDINGS: The right subdural hematoma appears slightly smaller than yesterday, measuring 8.2 mm in maximum thickness but extending over a smaller portion of the right cerebral surface. The inter hemispheric subdural hematoma appears a little larger, measuring 13 mm in maximum thickness in extending from anterior to posterior within the interhemispheric fissure. Also continues caudally and layers across the tentorium on the right. There is a mild degree of right to left midline shift, about 4 mm. No intraventricular hemorrhage or intraparenchymal hemorrhage is  evident. Basal cisterns remain patent. Moderate atrophy and chronic white matter hypodensity is present, likely small vessel disease no acute bony abnormalities are evident. Marked right frontal and periorbital soft tissue thickening again evident.  IMPRESSION: The inter hemispheric subdural hematoma appears a little larger while the right subdural hematoma appears a little smaller. Overall there is  no significant change in mass effect or midline shift.   Electronically Signed   By: Ellery Plunk M.D.   On: 05/11/2014 06:24   Ct Head Wo Contrast  05/10/2014   CLINICAL DATA:  Larey Seat at home this morning  EXAM: CT HEAD WITHOUT CONTRAST  CT MAXILLOFACIAL WITHOUT CONTRAST  CT CERVICAL SPINE WITHOUT CONTRAST  TECHNIQUE: Multidetector CT imaging of the head, cervical spine, and maxillofacial structures were performed using the standard protocol without intravenous contrast. Multiplanar CT image reconstructions of the cervical spine and maxillofacial structures were also generated.  COMPARISON:  02/16/2014  FINDINGS: CT HEAD FINDINGS  There is a large right frontal and temporal subdural hematoma. Maximal thickness measures 9 mm. There is a right to left midline shift measuring 5 mm. There also is subdural blood in the interhemispheric fissure, measuring up to 10 mm thickness. There is mild mass effect on the right lateral ventricle. Basal cisterns remain patent. There is moderate generalized atrophy and periventricular hypodensity consistent with chronic microvascular ischemic disease. No calvarial or skullbase fracture is evident. There is a large right frontal scalp hematoma.  CT MAXILLOFACIAL FINDINGS  No facial or periorbital fracture is evident. Orbital floors are intact. Zygomatic arches are intact. Pterygoid plates are intact. Mandible and TMJ intact. The large periorbital and right frontal scalp hematoma is again noted.  CT CERVICAL SPINE FINDINGS  The cervical vertebrae are normal in height. There is kyphosis  centered at C5. There are moderately severe degenerative disc changes at C5-6. The vertebral column, pedicles and facet articulations are intact, with no evidence of acute fracture. No acute soft tissue abnormality is evident.  There is degenerative central canal stenosis, greatest at C5-6 and C6-7.  IMPRESSION: 1. Large right frontal-temporal subdural hematoma with 5 mm right to left midline shift. There also is acute subdural blood in the interhemispheric fissure. 2. Moderate atrophy and chronic microvascular ischemic disease 3. Large right periorbital and right frontal scalp hematoma 4. No evidence of acute fracture involving the calvarium, skullbase, maxillofacial region or cervical spine 5. Degenerative central canal stenosis in the cervical spine at C5-6 and C6-7 6. Critical Value/emergent results were called by telephone at the time of interpretation on 05/10/2014 at 5:12 am to Dr. Marisa Severin , who verbally acknowledged these results.   Electronically Signed   By: Ellery Plunk M.D.   On: 05/10/2014 05:12   Ct Cervical Spine Wo Contrast  05/10/2014   CLINICAL DATA:  Larey Seat at home this morning  EXAM: CT HEAD WITHOUT CONTRAST  CT MAXILLOFACIAL WITHOUT CONTRAST  CT CERVICAL SPINE WITHOUT CONTRAST  TECHNIQUE: Multidetector CT imaging of the head, cervical spine, and maxillofacial structures were performed using the standard protocol without intravenous contrast. Multiplanar CT image reconstructions of the cervical spine and maxillofacial structures were also generated.  COMPARISON:  02/16/2014  FINDINGS: CT HEAD FINDINGS  There is a large right frontal and temporal subdural hematoma. Maximal thickness measures 9 mm. There is a right to left midline shift measuring 5 mm. There also is subdural blood in the interhemispheric fissure, measuring up to 10 mm thickness. There is mild mass effect on the right lateral ventricle. Basal cisterns remain patent. There is moderate generalized atrophy and periventricular  hypodensity consistent with chronic microvascular ischemic disease. No calvarial or skullbase fracture is evident. There is a large right frontal scalp hematoma.  CT MAXILLOFACIAL FINDINGS  No facial or periorbital fracture is evident. Orbital floors are intact. Zygomatic arches are intact. Pterygoid plates are intact. Mandible and  TMJ intact. The large periorbital and right frontal scalp hematoma is again noted.  CT CERVICAL SPINE FINDINGS  The cervical vertebrae are normal in height. There is kyphosis centered at C5. There are moderately severe degenerative disc changes at C5-6. The vertebral column, pedicles and facet articulations are intact, with no evidence of acute fracture. No acute soft tissue abnormality is evident.  There is degenerative central canal stenosis, greatest at C5-6 and C6-7.  IMPRESSION: 1. Large right frontal-temporal subdural hematoma with 5 mm right to left midline shift. There also is acute subdural blood in the interhemispheric fissure. 2. Moderate atrophy and chronic microvascular ischemic disease 3. Large right periorbital and right frontal scalp hematoma 4. No evidence of acute fracture involving the calvarium, skullbase, maxillofacial region or cervical spine 5. Degenerative central canal stenosis in the cervical spine at C5-6 and C6-7 6. Critical Value/emergent results were called by telephone at the time of interpretation on 05/10/2014 at 5:12 am to Dr. Marisa Severin , who verbally acknowledged these results.   Electronically Signed   By: Ellery Plunk M.D.   On: 05/10/2014 05:12   Dg Chest Port 1 View  05/23/2014   CLINICAL DATA:  Fall.  Head contusion.  EXAM: PORTABLE CHEST - 1 VIEW  COMPARISON:  05/10/2014  FINDINGS: Mild enlargement of the cardiopericardial silhouette. Aorta is mildly uncoiled. No mediastinal or hilar masses. Clear lungs. No pleural effusion or pneumothorax.  Bony thorax is demineralized but grossly intact.  IMPRESSION: No acute cardiopulmonary disease.    Electronically Signed   By: Amie Portland M.D.   On: 05/23/2014 15:58   Dg Chest Port 1 View  05/10/2014   CLINICAL DATA:  Larey Seat today in the bathroom. Facial trauma. History of hypertension, anxiety, depression, anemia, diabetes.  EXAM: PORTABLE CHEST - 1 VIEW  COMPARISON:  02/12/2014  FINDINGS: Heart is mildly enlarged. Shallow lung inflation. There are no focal consolidations or pleural effusions. No overt edema.  IMPRESSION: 1. Stable, mild cardiomegaly. 2. Shallow lung inflation.   Electronically Signed   By: Norva Pavlov M.D.   On: 05/10/2014 15:46   Dg Hip Unilat With Pelvis 2-3 Views Right  05/10/2014   CLINICAL DATA:  Larey Seat this morning  EXAM: RIGHT HIP (WITH PELVIS) 2-3 VIEWS  COMPARISON:  04/27/2013  FINDINGS: There is a greater trochanter fracture, nondisplaced. The total hip arthroplasty hardware appears intact. There is no evidence of dislocation.  IMPRESSION: Acute fracture of the right greater trochanter   Electronically Signed   By: Ellery Plunk M.D.   On: 05/10/2014 06:16   Ct Maxillofacial Wo Cm  05/10/2014   CLINICAL DATA:  Larey Seat at home this morning  EXAM: CT HEAD WITHOUT CONTRAST  CT MAXILLOFACIAL WITHOUT CONTRAST  CT CERVICAL SPINE WITHOUT CONTRAST  TECHNIQUE: Multidetector CT imaging of the head, cervical spine, and maxillofacial structures were performed using the standard protocol without intravenous contrast. Multiplanar CT image reconstructions of the cervical spine and maxillofacial structures were also generated.  COMPARISON:  02/16/2014  FINDINGS: CT HEAD FINDINGS  There is a large right frontal and temporal subdural hematoma. Maximal thickness measures 9 mm. There is a right to left midline shift measuring 5 mm. There also is subdural blood in the interhemispheric fissure, measuring up to 10 mm thickness. There is mild mass effect on the right lateral ventricle. Basal cisterns remain patent. There is moderate generalized atrophy and periventricular hypodensity  consistent with chronic microvascular ischemic disease. No calvarial or skullbase fracture is evident. There is a large  right frontal scalp hematoma.  CT MAXILLOFACIAL FINDINGS  No facial or periorbital fracture is evident. Orbital floors are intact. Zygomatic arches are intact. Pterygoid plates are intact. Mandible and TMJ intact. The large periorbital and right frontal scalp hematoma is again noted.  CT CERVICAL SPINE FINDINGS  The cervical vertebrae are normal in height. There is kyphosis centered at C5. There are moderately severe degenerative disc changes at C5-6. The vertebral column, pedicles and facet articulations are intact, with no evidence of acute fracture. No acute soft tissue abnormality is evident.  There is degenerative central canal stenosis, greatest at C5-6 and C6-7.  IMPRESSION: 1. Large right frontal-temporal subdural hematoma with 5 mm right to left midline shift. There also is acute subdural blood in the interhemispheric fissure. 2. Moderate atrophy and chronic microvascular ischemic disease 3. Large right periorbital and right frontal scalp hematoma 4. No evidence of acute fracture involving the calvarium, skullbase, maxillofacial region or cervical spine 5. Degenerative central canal stenosis in the cervical spine at C5-6 and C6-7 6. Critical Value/emergent results were called by telephone at the time of interpretation on 05/10/2014 at 5:12 am to Dr. Marisa Severin , who verbally acknowledged these results.   Electronically Signed   By: Ellery Plunk M.D.   On: 05/10/2014 05:12    ASSESSMENT & PLAN #1 anemia of chronic illness She have chronic anemia secondary to anemia chronic disease related to renal failure. She have received darbepoetin injection recently. I noted a big discrepancy between her CBC this week and last week. I suspect there may be an element of hemoconcentration due to dehydration. In any case, the patient would not need any blood transfusion right now. I  recommend blood transfusion, 1 unit as needed to keep hemoglobin above 8 g perioperatively.    #2 recent thrombocytopenia Her most recent thrombocytopenia is likely due to consumption from recent bleeding. Her platelet count is stable today. I recommend we monitor closely.  #3 subdural hematoma This is due to recurrent falls. From the hematology standpoint, there is no contraindication for her to proceed with surgery as indicated. I will follow closely perioperatively and will recommend transfusion as needed  #4 uncontrolled hypertension The patient usually had poorly controlled blood pressure even in the outpatient setting. I will defer to primary service for blood pressure management  #5 chronic kidney disease The patient appear clinically dehydrated. I recommend IV fluid resuscitation essentially in view of recent poor oral intake  #6 chronic tobacco abuse Hopefully the patient can quit using tobacco products while hospitalized  #7 elevated total bilirubin I suspect this is due to breakdown of blood products from recent subdural hematoma. I recommend close monitoring.  Please call me if questions arise. I will continue to follow the patient closely.  [facility], Ernesha Ramone, MD 05/23/2014

## 2014-05-23 NOTE — ED Notes (Signed)
Pt had earlier this month resulting in subdural hematoma; had follow up CT scan on Friday, called with results today that it was worse. Pt lethargic, slumped over in wheelchair.

## 2014-05-24 ENCOUNTER — Inpatient Hospital Stay (HOSPITAL_COMMUNITY): Payer: Medicare Other

## 2014-05-24 ENCOUNTER — Encounter (HOSPITAL_COMMUNITY): Payer: Self-pay | Admitting: Neurosurgery

## 2014-05-24 DIAGNOSIS — J9600 Acute respiratory failure, unspecified whether with hypoxia or hypercapnia: Secondary | ICD-10-CM | POA: Insufficient documentation

## 2014-05-24 DIAGNOSIS — N189 Chronic kidney disease, unspecified: Secondary | ICD-10-CM | POA: Insufficient documentation

## 2014-05-24 LAB — CBC WITH DIFFERENTIAL/PLATELET
Basophils Absolute: 0 10*3/uL (ref 0.0–0.1)
Basophils Relative: 0 % (ref 0–1)
EOS ABS: 0.2 10*3/uL (ref 0.0–0.7)
EOS PCT: 2 % (ref 0–5)
HCT: 23.4 % — ABNORMAL LOW (ref 36.0–46.0)
Hemoglobin: 7.7 g/dL — ABNORMAL LOW (ref 12.0–15.0)
Lymphocytes Relative: 17 % (ref 12–46)
Lymphs Abs: 1.2 10*3/uL (ref 0.7–4.0)
MCH: 29.1 pg (ref 26.0–34.0)
MCHC: 32.9 g/dL (ref 30.0–36.0)
MCV: 88.3 fL (ref 78.0–100.0)
Monocytes Absolute: 0.7 10*3/uL (ref 0.1–1.0)
Monocytes Relative: 10 % (ref 3–12)
Neutro Abs: 4.9 10*3/uL (ref 1.7–7.7)
Neutrophils Relative %: 71 % (ref 43–77)
Platelets: 184 10*3/uL (ref 150–400)
RBC: 2.65 MIL/uL — ABNORMAL LOW (ref 3.87–5.11)
RDW: 14.6 % (ref 11.5–15.5)
WBC: 7 10*3/uL (ref 4.0–10.5)

## 2014-05-24 LAB — BASIC METABOLIC PANEL
Anion gap: 7 (ref 5–15)
BUN: 38 mg/dL — ABNORMAL HIGH (ref 6–23)
CALCIUM: 7.8 mg/dL — AB (ref 8.4–10.5)
CO2: 19 mmol/L (ref 19–32)
CREATININE: 1.69 mg/dL — AB (ref 0.50–1.10)
Chloride: 112 mmol/L (ref 96–112)
GFR calc Af Amer: 32 mL/min — ABNORMAL LOW (ref >90)
GFR calc non Af Amer: 28 mL/min — ABNORMAL LOW (ref >90)
Glucose, Bld: 86 mg/dL (ref 70–99)
POTASSIUM: 4.3 mmol/L (ref 3.5–5.1)
Sodium: 138 mmol/L (ref 135–145)

## 2014-05-24 LAB — URINALYSIS, ROUTINE W REFLEX MICROSCOPIC
Bilirubin Urine: NEGATIVE
Glucose, UA: NEGATIVE mg/dL
HGB URINE DIPSTICK: NEGATIVE
Ketones, ur: NEGATIVE mg/dL
NITRITE: NEGATIVE
PH: 6 (ref 5.0–8.0)
PROTEIN: 100 mg/dL — AB
Specific Gravity, Urine: 1.011 (ref 1.005–1.030)
Urobilinogen, UA: 0.2 mg/dL (ref 0.0–1.0)

## 2014-05-24 LAB — BLOOD GAS, ARTERIAL
Acid-base deficit: 6 mmol/L — ABNORMAL HIGH (ref 0.0–2.0)
BICARBONATE: 18.4 meq/L — AB (ref 20.0–24.0)
Drawn by: 365271
FIO2: 0.3 %
O2 SAT: 98.7 %
PATIENT TEMPERATURE: 97.4
PCO2 ART: 32.2 mmHg — AB (ref 35.0–45.0)
PEEP/CPAP: 5 cmH2O
RATE: 14 resp/min
TCO2: 19.4 mmol/L (ref 0–100)
VT: 420 mL
pH, Arterial: 7.372 (ref 7.350–7.450)
pO2, Arterial: 159 mmHg — ABNORMAL HIGH (ref 80.0–100.0)

## 2014-05-24 LAB — GLUCOSE, CAPILLARY
GLUCOSE-CAPILLARY: 100 mg/dL — AB (ref 70–99)
GLUCOSE-CAPILLARY: 86 mg/dL (ref 70–99)
GLUCOSE-CAPILLARY: 94 mg/dL (ref 70–99)
Glucose-Capillary: 90 mg/dL (ref 70–99)
Glucose-Capillary: 80 mg/dL (ref 70–99)
Glucose-Capillary: 84 mg/dL (ref 70–99)

## 2014-05-24 LAB — URINE MICROSCOPIC-ADD ON

## 2014-05-24 MED ORDER — SODIUM CHLORIDE 0.9 % IV BOLUS (SEPSIS)
500.0000 mL | Freq: Once | INTRAVENOUS | Status: AC
  Administered 2014-05-24: 500 mL via INTRAVENOUS

## 2014-05-24 NOTE — Progress Notes (Signed)
Made Dr. Delton CoombesByrum aware of patients low urine output. No further orders received.

## 2014-05-24 NOTE — Progress Notes (Signed)
Patient ID: [name], female   DOB: 05/09/1932, 81 y.o.   MRN: [id number] Intubated, not f/c. Drain with serous fluid. Moves to pain stimulation, CCM helping

## 2014-05-24 NOTE — Progress Notes (Signed)
Urine output back down to 10cc/hr after 500cc bolus earlier. Notified ELINK MD. No new orders received. Will continue to monitor.  Fynley Chrystal, [place] Marie, RN

## 2014-05-24 NOTE — Op Note (Signed)
[name]:  Gross, [name]                  ACCOUNT NO.:  [id number]  MEDICAL RECORD NO.:  [id number]  LOCATION:                                FACILITY:  [facility]  PHYSICIAN:  Hilda LiasErnesto Zakyah Yanes, M.D.   DATE OF BIRTH:  1932-10-22  DATE OF PROCEDURE:  05/23/2014 DATE OF DISCHARGE:                              OPERATIVE REPORT   PREOPERATIVE DIAGNOSES:  Closed head injury.  Right frontotemporoparietal chronic and subacute subdural hematoma.  Shift from right to left.  POSTOPERATIVE DIAGNOSES:  Closed head injury.  Right frontotemporoparietal chronic and subacute subdural hematoma.  Shift from right to left.  PROCEDURE:  Right frontotemporoparietal craniotomy.  Question of chronic and subacute subdural hematoma.  SURGEON:  Hilda LiasErnesto Ellie Bryand, M.D.  CLINICAL HISTORY:  The lady was admitted several days ago to the hospital after she fell.  She had a small subdural hematoma.  The patient has a history of coagulopathy.  The patient eventually was discharged because she was doing really well.  Nevertheless she has another CT scan past Friday.  Today her daughter took her to her family doctor.  We found that she was quite sleepy.  She was brought immediately to the emergency room and I found that she was sleepy with weakness in the left side, the left arm worse than right one.  The CT scan from Friday showed increase of the subdural hematoma with more shift.  Surgery was advised and the risk was fully explained to the daughter including the possibility of no improvement, need of further surgery, and her probably with chronic coagulopathy.  DESCRIPTION OF PROCEDURE:  The patient was taken to the OR, and after intubation, the right side of the head was shaved and prepped with DuraPrep.  Drapes were applied.  Italic S type of incision was made from the right frontal into the open temporal and inferior parietal area. Raney clips were applied to the edge of the scalp.  Then, I made 2 burr holes 1 anterior and  posterior and they were connected with a craniotome.  Immediately blood which was mostly chronic came through the wound with a high pressure.  There was a collection of subacute blood mostly in the right posterotemporal area.  Removal was done.  Irrigation was done with drainage of the chronic and subacute hematoma.  Hemostasis was done with bipolar.  The Surgifoam was used to cover the area mostly in the temporal area.  More irrigation was done and there was no evidence of any active bleeding.  A drain was left in the subdural space.  Then, DuraGen was used to replace the dura mater.  The bone flap was pulled back in place using 3 small plates.  Then, the scalp was closed with Vicryl and staples.  The patient is going to remain intubated and she is going to go to the intensive care unit.          ______________________________ Hilda LiasErnesto Alvie Fowles, M.D.     EB/MEDQ  D:  05/23/2014  T:  05/24/2014  Job:  [id number]

## 2014-05-24 NOTE — Progress Notes (Signed)
eLink Physician-Brief Progress Note Patient Name: Olivia GangHelen L Freels DOB: 05/06/1932 MRN: [id number]   Date of Service  05/24/2014  HPI/Events of Note  Oliguric.  eICU Interventions  Will give NS 500 ml bolus.     Intervention Category Major Interventions: Other:  Theordore Cisnero 05/24/2014, 1:09 AM

## 2014-05-24 NOTE — Progress Notes (Signed)
INITIAL NUTRITION ASSESSMENT  DOCUMENTATION CODES Per approved criteria  -Severe malnutrition in the context of chronic illness   INTERVENTION:  Recommend bowel regimen. Pt currently on no medications for constipation.   Recommend if pt remains intubated initiate Vital 1.2 @ 15 ml/hr via OG tube and increase by 10 ml every 4 hours to goal rate of 35 ml/hr.   30 ml Prostat daily.    Tube feeding regimen provides 1108 kcal (100% of needs), 78 grams of protein, and 681 ml of H2O.   NUTRITION DIAGNOSIS: Malnutrition related to chronic illness as evidenced by severe muscle depletion and intake of </= 75% of her needs for >/= 1 month.   Goal: Pt to meet >/= 90% of their estimated nutrition needs   Monitor:  Vent status, Propofol rate, TF initiation, weight trends  Reason for Assessment: Ventilator; Pt identified as at nutrition risk on the Malnutrition Screen Tool.  ASSESSMENT: Pt hospitalized 3/15-3/19 after fall with SDH, medically managed. Pt admitted from PCP office with worsening SDH. S/P craniotomy 3/29.   Patient remains intubated on ventilator support MV: 6.4 L/min Temp (24hrs), Avg:98.1 F (36.7 C), Min:97.1 F (36.2 C), Max:99.4 F (37.4 C)  Propofol: 5.8 ml/hr, currently off  BUN/Cr elvated Per daughter pt has regained a couple of pounds since she has been on Megace for appetite per records in chart pt on 40 mg BID which is not recommended dose for appetite stimulation. Pt also on Remeron. Pt usually eats 2 meals per day. No Breakfast; Lunch: meat, starch, veggie, Dinner: meat, starch, veggie. Pt does not consume any oral supplements. Pt did not eat for a few days PTA.  Daughter reports pt has constipation and is on stool softener and what sounds like kayexalate which helps but last bm was a couple of days ago and was small and just a couple of balls.  Pt with skin breakdown on admission (PU stage II x 2). Daughter confirmed that this is an issue at home.   Nutrition  Focused Physical Exam:  Subcutaneous Fat:  Orbital Region: WDL Upper Arm Region: WDL Thoracic and Lumbar Region: mild/moderate depletion  Muscle:  Temple Region: severe depletion Clavicle Bone Region: severe depletion Clavicle and Acromion Bone Region: severe depletion Scapular Bone Region: unable to assess Dorsal Hand: severe depletion Patellar Region: mild/moderate depletion Anterior Thigh Region: mild/moderate depletion Posterior Calf Region: mild/moderate depletion  Edema: not present   Height: Ht Readings from Last 1 Encounters:  05/23/14 5\' 3"  (1.6 m)    Weight: Wt Readings from Last 1 Encounters:  05/23/14 102 lb 15.3 oz (46.7 kg)    Ideal Body Weight: 52.2 kg   % Ideal Body Weight: 89%  Wt Readings from Last 10 Encounters:  05/23/14 102 lb 15.3 oz (46.7 kg)  05/10/14 106 lb 11.2 oz (48.4 kg)  05/03/14 106 lb (48.081 kg)  04/21/14 103 lb 11.2 oz (47.038 kg)  03/22/14 105 lb 8 oz (47.854 kg)  03/10/14 108 lb (48.988 kg)  02/17/14 108 lb 11.2 oz (49.306 kg)  02/15/14 108 lb 13.1 oz (49.36 kg)  09/30/13 99 lb 12.8 oz (45.269 kg)  06/28/13 105 lb 4.8 oz (47.764 kg)    Usual Body Weight: 92-105 lb   % Usual Body Weight: -  BMI:  Body mass index is 18.24 kg/(m^2).  Estimated Nutritional Needs: Kcal: 1112 Protein: 75-90 grams Fluid: > 1.5 L/day  Skin:  PU stage II on sacrum PU stage II on right buttocks  Diet Order: Diet NPO time  specified  EDUCATION NEEDS: -No education needs identified at this time   Intake/Output Summary (Last 24 hours) at 05/24/14 1441 Last data filed at 05/24/14 1300  Gross per 24 hour  Intake 3018.06 ml  Output    481 ml  Net 2537.06 ml    Last BM: PTA   Labs:   Recent Labs Lab 05/23/14 1339 05/23/14 2120 05/24/14 0510  NA 135 138 138  K 4.9 4.1 4.3  CL 105 111 112  CO2 BUN 40* 37* 38*  CREATININE 1.96* 1.79* 1.69*  CALCIUM 9.0 7.8* 7.8*  GLUCOSE 109* 97 86    CBG (last 3)   Recent  Labs  05/24/14 0434 05/24/14 0841 05/24/14 1209  GLUCAP 90 80 84    Scheduled Meds: . antiseptic oral rinse  7 mL Mouth Rinse QID  . chlorhexidine  15 mL Mouth Rinse BID  . insulin aspart  2-6 Units Subcutaneous 6 times per day  . levETIRAcetam  500 mg Intravenous Q12H  . pantoprazole (PROTONIX) IV  40 mg Intravenous QHS    Continuous Infusions: . sodium chloride 75 mL/hr at 05/24/14 0300  . propofol Stopped (05/24/14 1300)    Kendell Bane RD, LDN, CNSC [phone] Pager [phone] After Hours Pager

## 2014-05-25 ENCOUNTER — Inpatient Hospital Stay (HOSPITAL_COMMUNITY): Payer: Medicare Other

## 2014-05-25 ENCOUNTER — Other Ambulatory Visit (HOSPITAL_COMMUNITY): Payer: Self-pay | Admitting: Neurosurgery

## 2014-05-25 DIAGNOSIS — Z789 Other specified health status: Secondary | ICD-10-CM

## 2014-05-25 DIAGNOSIS — N390 Urinary tract infection, site not specified: Secondary | ICD-10-CM

## 2014-05-25 DIAGNOSIS — Z978 Presence of other specified devices: Secondary | ICD-10-CM | POA: Insufficient documentation

## 2014-05-25 DIAGNOSIS — J9601 Acute respiratory failure with hypoxia: Secondary | ICD-10-CM

## 2014-05-25 DIAGNOSIS — D638 Anemia in other chronic diseases classified elsewhere: Secondary | ICD-10-CM

## 2014-05-25 DIAGNOSIS — J9690 Respiratory failure, unspecified, unspecified whether with hypoxia or hypercapnia: Secondary | ICD-10-CM | POA: Insufficient documentation

## 2014-05-25 LAB — GLUCOSE, CAPILLARY
GLUCOSE-CAPILLARY: 81 mg/dL (ref 70–99)
GLUCOSE-CAPILLARY: 86 mg/dL (ref 70–99)
Glucose-Capillary: 93 mg/dL (ref 70–99)
Glucose-Capillary: 83 mg/dL (ref 70–99)
Glucose-Capillary: 96 mg/dL (ref 70–99)
Glucose-Capillary: 97 mg/dL (ref 70–99)
Glucose-Capillary: 147 mg/dL — ABNORMAL HIGH (ref 70–99)

## 2014-05-25 LAB — TRIGLYCERIDES: Triglycerides: 95 mg/dL (ref <150)

## 2014-05-25 MED ORDER — PANTOPRAZOLE SODIUM 40 MG PO PACK
40.0000 mg | PACK | ORAL | Status: DC
  Administered 2014-05-25 – 2014-05-27 (×3): 40 mg
  Filled 2014-05-25 (×4): qty 20

## 2014-05-25 MED ORDER — PRO-STAT SUGAR FREE PO LIQD
30.0000 mL | Freq: Every day | ORAL | Status: DC
  Administered 2014-05-25 – 2014-05-27 (×3): 30 mL
  Filled 2014-05-25 (×3): qty 30

## 2014-05-25 MED ORDER — METOPROLOL TARTRATE 25 MG/10 ML ORAL SUSPENSION
50.0000 mg | Freq: Two times a day (BID) | ORAL | Status: DC
  Administered 2014-05-25 – 2014-05-27 (×5): 50 mg
  Filled 2014-05-25 (×6): qty 20

## 2014-05-25 MED ORDER — INSULIN ASPART 100 UNIT/ML ~~LOC~~ SOLN
0.0000 [IU] | SUBCUTANEOUS | Status: DC
  Administered 2014-05-25 – 2014-05-27 (×5): 2 [IU] via SUBCUTANEOUS

## 2014-05-25 MED ORDER — VITAL HIGH PROTEIN PO LIQD
1000.0000 mL | ORAL | Status: DC
  Filled 2014-05-25 (×2): qty 1000

## 2014-05-25 MED ORDER — VITAL AF 1.2 CAL PO LIQD
1000.0000 mL | ORAL | Status: DC
  Administered 2014-05-25: 1000 mL
  Filled 2014-05-25 (×5): qty 1000

## 2014-05-25 MED ORDER — ATORVASTATIN CALCIUM 20 MG PO TABS
20.0000 mg | ORAL_TABLET | Freq: Every day | ORAL | Status: DC
  Administered 2014-05-25 – 2014-05-26 (×2): 20 mg
  Filled 2014-05-25 (×3): qty 1

## 2014-05-25 MED ORDER — ACETAMINOPHEN 160 MG/5ML PO SOLN
650.0000 mg | Freq: Four times a day (QID) | ORAL | Status: DC | PRN
  Administered 2014-05-25 – 2014-05-27 (×2): 650 mg via ORAL
  Filled 2014-05-25 (×2): qty 20.3

## 2014-05-25 MED ORDER — CEFTRIAXONE SODIUM IN DEXTROSE 20 MG/ML IV SOLN
1.0000 g | INTRAVENOUS | Status: DC
  Administered 2014-05-25 – 2014-05-27 (×3): 1 g via INTRAVENOUS
  Filled 2014-05-25 (×3): qty 50

## 2014-05-25 NOTE — Progress Notes (Signed)
[facility]  Patient Status: Active (receiving services up to time of hospitalization)  [facility] is providing the following services: RN, PT and OT  If patient discharges after hours, please call [phone].   Olivia DadaMiranda Adkins 05/25/2014, 9:44 AM

## 2014-05-25 NOTE — Progress Notes (Signed)
Patient ID: [name], female   DOB: 07/04/1932, 81 y.o.   MRN: [id number] Swelling of right eye. Open left eye. F/c. Drain with some bloody serous drainage. Not ready to be extubated. Spoke with family

## 2014-05-25 NOTE — Progress Notes (Signed)
[name]   DOB:04/26/1932   ZO#:[id number]    Subjective: Patient remains sedated and intubated. She had surgery on 05/23/2014 for evacuation of subdural hematoma. She had drain in situ draining minimal serosanguineous fluid. No excessive bleeding is noted.  Objective:  Filed Vitals:   05/25/14 0800  BP: 172/69  Pulse: 97  Temp:   Resp: 14     Intake/Output Summary (Last 24 hours) at 05/25/14 0838 Last data filed at 05/25/14 0700  Gross per 24 hour  Intake 1948.2 ml  Output    585 ml  Net 1363.2 ml    GENERAL: Sedated and intubated.  SKIN: skin color is pale no petechiae rash OROPHARYNX: ET tube in situ. NEURO:  Sedated and intubated.  Labs:  Lab Results  Component Value Date   WBC 7.0 05/24/2014   HGB 7.7* 05/24/2014   HCT 23.4* 05/24/2014   MCV 88.3 05/24/2014   PLT 184 05/24/2014   NEUTROABS 4.9 05/24/2014    Lab Results  Component Value Date   NA 138 05/24/2014   K 4.3 05/24/2014   CL 112 05/24/2014   CO2 19 05/24/2014    Studies:  Portable Chest Xray  05/24/2014   CLINICAL DATA:  Evaluate endotracheal tube placement  EXAM: PORTABLE CHEST - 1 VIEW  COMPARISON:  05/23/2014  FINDINGS: Cardiac shadow is stable. A right jugular central line is again noted in the mid superior vena cava. An endotracheal tube is again seen 2.3 cm above the carina and stable. No focal infiltrate or sizable effusion is seen. Previously seen granuloma in the left lung base is again seen and stable.  IMPRESSION: No change in tubes and lines when compare with the prior exam. No new focal abnormality is seen.   Electronically Signed   By: Alcide Clever M.D.   On: 05/24/2014 07:38   Dg Chest Port 1 View  05/23/2014   CLINICAL DATA:  Initial encounter for endotracheal tube placement  EXAM: PORTABLE CHEST - 1 VIEW  COMPARISON:  05/23/2014, earlier the same day.  FINDINGS: 2041 hrs. Endotracheal tube tip is 3.7 cm above the base of the carina. Right IJ central line tip overlies the mid to distal  SVC. Tiny nodule at the left base is stable back to 03/04/2013 and is probably a granuloma. No edema or pleural effusion. Cardiopericardial silhouette is at upper limits of normal for size. Telemetry leads overlie the chest.  IMPRESSION: Endotracheal tube tip is 3.7 cm above the base of the carina.   Electronically Signed   By: Kennith Center M.D.   On: 05/23/2014 22:39   Dg Chest Port 1 View  05/23/2014   CLINICAL DATA:  Fall.  Head contusion.  EXAM: PORTABLE CHEST - 1 VIEW  COMPARISON:  05/10/2014  FINDINGS: Mild enlargement of the cardiopericardial silhouette. Aorta is mildly uncoiled. No mediastinal or hilar masses. Clear lungs. No pleural effusion or pneumothorax.  Bony thorax is demineralized but grossly intact.  IMPRESSION: No acute cardiopulmonary disease.   Electronically Signed   By: Amie Portland M.D.   On: 05/23/2014 15:58    Assessment & Plan:   #1 anemia of chronic illness with probable mild surgical blood loss She have chronic anemia secondary to anemia chronic disease related to renal failure. She have received darbepoetin injection recently. I see that critical care service is involved. I will defer to them for transfusion threshold  #2 recent thrombocytopenia Her most recent thrombocytopenia is likely due to consumption from recent bleeding. Her platelet count  is stable on 3/29. I recommend we monitor closely.  #3 subdural hematoma s/p evacuation This is due to recurrent falls.  #4 uncontrolled hypertension The patient usually had poorly controlled blood pressure even in the outpatient setting. I will defer to primary service for blood pressure management  #5 chronic kidney disease, stable I recommend IV fluid resuscitation essentially in view of recent poor oral intake  #6 chronic tobacco abuse Hopefully the patient can quit using tobacco products after hospitalized  I will sign off. She has appointment for Aranesp injection and blood count monitoring in May. If she  needs to be seen sooner after discharge, please set me know. [facility] Center[facility]OR[facility]CH, Annis Lagoy, MD 05/25/2014  8:38 AM

## 2014-05-25 NOTE — Progress Notes (Signed)
NUTRITION FOLLOW-UP  DOCUMENTATION CODES Per approved criteria  -Severe malnutrition in the context of chronic illness   INTERVENTION:  Recommend bowel regimen. Pt currently on no medications for constipation. Discussed during rounds.   Initiate Vital 1.2 @ 15 ml/hr via OG tube and increase by 10 ml every 4 hours to goal rate of 35 ml/hr.   30 ml Prostat daily.    Tube feeding regimen provides 1108 kcal (100% of needs), 78 grams of protein, and 681 ml of H2O.   NUTRITION DIAGNOSIS: Malnutrition related to chronic illness as evidenced by severe muscle depletion and intake of </= 75% of her needs for >/= 1 month; ongoing.   Goal: Pt to meet >/= 90% of their estimated nutrition needs; not met.    Monitor:  Vent status, TF tolerance weight trends  Reason for Assessment: Ventilator; Pt identified as at nutrition risk on the Malnutrition Screen Tool.  ASSESSMENT: Pt hospitalized 3/15-3/19 after fall with SDH, medically managed. Pt admitted from PCP office with worsening SDH. S/P craniotomy 3/28.   Patient remains intubated on ventilator support MV: 6.5 L/min Temp (24hrs), Avg:99.2 F (37.3 C), Min:97.3 F (36.3 C), Max:99.9 F (37.7 C)   Pt with skin breakdown on admission (PU stage II x 2).  BUN/Cr elevated Pt discussed during ICU rounds and with RN.   Height: Ht Readings from Last 1 Encounters:  05/23/14 $RemoveB'5\' 3"'zkjpdXBH$  (1.6 m)    Weight: Wt Readings from Last 1 Encounters:  05/23/14 102 lb 15.3 oz (46.7 kg)    BMI:  Body mass index is 18.24 kg/(m^2).  Estimated Nutritional Needs: Kcal: 1112 Protein: 75-90 grams Fluid: > 1.5 L/day  Skin:  PU stage II on sacrum PU stage II on right buttocks  Diet Order: Diet NPO time specified   Intake/Output Summary (Last 24 hours) at 05/25/14 1214 Last data filed at 05/25/14 1000  Gross per 24 hour  Intake   1850 ml  Output    600 ml  Net   1250 ml    Last BM: PTA   Labs:   Recent Labs Lab 05/23/14 1339  05/23/14 2120 05/24/14 0510  NA 135 138 138  K 4.9 4.1 4.3  CL 105 111 112  CO2 $Re'19 19 19  'HXF$ BUN 40* 37* 38*  CREATININE 1.96* 1.79* 1.69*  CALCIUM 9.0 7.8* 7.8*  GLUCOSE 109* 97 86    CBG (last 3)   Recent Labs  05/25/14 0403 05/25/14 0739 05/25/14 1141  GLUCAP 93 83 81    Scheduled Meds: . antiseptic oral rinse  7 mL Mouth Rinse QID  . atorvastatin  20 mg Per Tube q1800  . cefTRIAXone (ROCEPHIN)  IV  1 g Intravenous Q24H  . chlorhexidine  15 mL Mouth Rinse BID  . feeding supplement (PRO-STAT SUGAR FREE 64)  30 mL Per Tube Daily  . insulin aspart  0-15 Units Subcutaneous 6 times per day  . levETIRAcetam  500 mg Intravenous Q12H  . metoprolol tartrate  50 mg Per Tube BID  . pantoprazole sodium  40 mg Per Tube Q24H    Continuous Infusions: . sodium chloride 75 mL/hr at 05/25/14 0848  . feeding supplement (VITAL AF 1.2 CAL)      Maylon Peppers RD, [place], [place] Pager [phone] After Hours Pager

## 2014-05-25 NOTE — Progress Notes (Signed)
PULMONARY / CRITICAL CARE MEDICINE   Name: Olivia GangHelen L Adkins MRN: [id number] DOB: 05/20/1932    ADMISSION DATE:  05/23/2014 CONSULTATION DATE:  05/22/2013  REFERRING MD :  Dr. Jeral FruitBotero  CHIEF COMPLAINT:  SDH  INITIAL PRESENTATION:  81 yo female s/p fall with SDH and mental status change.  Had evacuation of hematoma 3/28 and remained on vent post-op.  STUDIES:  3/25 CT head >> increased size of SDH with 7 mm Rt to Lt shift  SIGNIFICANT EVENTS: 3/28 Admit, evacuation of hematoma  SUBJECTIVE:  Tolerating pressure support.  VITAL SIGNS: Temp:  [97.3 F (36.3 C)-99.9 F (37.7 C)] 99.3 F (37.4 C) (03/30 0741) Pulse Rate:  [69-106] 82 (03/30 0900) Resp:  [13-23] 13 (03/30 0900) BP: (101-191)/(43-111) 168/65 mmHg (03/30 0900) SpO2:  [100 %] 100 % (03/30 0900) FiO2 (%):  [30 %] 30 % (03/30 0849) VENTILATOR SETTINGS: Vent Mode:  [-] CPAP;PSV FiO2 (%):  [30 %] 30 % Set Rate:  [14 bmp] 14 bmp Vt Set:  [420 mL] 420 mL PEEP:  [5 cmH20] 5 cmH20 Pressure Support:  [8 cmH20] 8 cmH20 Plateau Pressure:  [14 cmH20-17 cmH20] 14 cmH20 INTAKE / OUTPUT:  Intake/Output Summary (Last 24 hours) at 05/25/14 0933 Last data filed at 05/25/14 0849  Gross per 24 hour  Intake 2042.4 ml  Output    605 ml  Net 1437.4 ml    PHYSICAL EXAMINATION: General: no distress Neuro: opens eyes spontaneously, not following commands HEENT:  Wound site clean, ETT in place Cardiovascular: regular Lungs: no wheeze Abdomen:  Soft, non-tender Musculoskeletal: no edema Skin: no rashes  LABS:  CBC  Recent Labs Lab 05/23/14 1339 05/23/14 2120 05/24/14 0510  WBC 10.4 5.1 7.0  HGB 10.3* 7.7* 7.7*  HCT 32.4* 23.6* 23.4*  PLT 259 199 184   BMET  Recent Labs Lab 05/23/14 1339 05/23/14 2120 05/24/14 0510  NA 135 138 138  K 4.9 4.1 4.3  CL 105 111 112  CO2 19 19 19   BUN 40* 37* 38*  CREATININE 1.96* 1.79* 1.69*  GLUCOSE 109* 97 86   Electrolytes  Recent Labs Lab 05/23/14 1339 05/23/14 2120  05/24/14 0510  CALCIUM 9.0 7.8* 7.8*   ABG  Recent Labs Lab 05/23/14 2045 05/24/14 0357  PHART 7.426 7.372  PCO2ART 27.6* 32.2*  PO2ART 216.0* 159.0*   Liver Enzymes  Recent Labs Lab 05/23/14 1339  AST 19  ALT 11  ALKPHOS 74  BILITOT 1.3*  ALBUMIN 3.5   Glucose  Recent Labs Lab 05/24/14 1209 05/24/14 1537 05/24/14 2028 05/24/14 2345 05/25/14 0403 05/25/14 0739  GLUCAP 84 86 94 86 93 83    Imaging Portable Chest Xray  05/24/2014   CLINICAL DATA:  Evaluate endotracheal tube placement  EXAM: PORTABLE CHEST - 1 VIEW  COMPARISON:  05/23/2014  FINDINGS: Cardiac shadow is stable. A right jugular central line is again noted in the mid superior vena cava. An endotracheal tube is again seen 2.3 cm above the carina and stable. No focal infiltrate or sizable effusion is seen. Previously seen granuloma in the left lung base is again seen and stable.  IMPRESSION: No change in tubes and lines when compare with the prior exam. No new focal abnormality is seen.   Electronically Signed   By: Olivia CleverMark  Adkins M.D.   On: 05/24/2014 07:38     ASSESSMENT / PLAN:  NEUROLOGIC A:   Subacute SDH s/p decompressive craniotomy 3/28. Hx of depression, anxiety. P:   RASS goal:-1 AED's, post-op care  per neurosurgery Hold outpt remeron  PULMONARY ETT 3/28 >> A: Acute respiratory failure 2nd to altered mental status. Tobacco abuse. P:   Pressure support wean as tolerated >> mental status precludes extubation trial at this time  CARDIOVASCULAR  A:  Hx of HTN, HLD. Hx of chronic systolic CHF >> EF 35 to 40% on Echo 02/13/14. P:  Resume lipitor, lopressor Hold outpt catepres, lasix, hydralazine, imdur for now No ASA due to SDH  RENAL A:   CKD stage 3 >> baseline creatinine 2.08 from 02/17/14. Hx of irritable bladder. P:   Monitor renal fx, urine outpt Hold outpt ditropan  GASTROINTESTINAL A:   Nutrition. P:   Tube feeds while on vent Protonix for SUP  HEMATOLOGIC A:    Anemia of critical illness and chronic disease. Hx of thrombocytopenia. P:  F/u CBC SCD's for DVT prevention  INFECTIOUS A:   UTI. P:   Day 1 rocephin  Urine 3/29 >> GNR >>  ENDOCRINE A:   No acute issues. P:   SSI while on tube feeds  Updated family at bedside 3/30.  CC time 35 minutes.  Coralyn Helling, MD [facility] Pulmonary/Critical Care 05/25/2014, 9:45 AM Pager:  [phone] After 3pm call: [phone]

## 2014-05-26 ENCOUNTER — Inpatient Hospital Stay (HOSPITAL_COMMUNITY): Payer: Medicare Other

## 2014-05-26 ENCOUNTER — Ambulatory Visit: Payer: Medicare Other

## 2014-05-26 ENCOUNTER — Other Ambulatory Visit: Payer: Medicare Other

## 2014-05-26 LAB — BASIC METABOLIC PANEL WITH GFR
Anion gap: 7 (ref 5–15)
BUN: 33 mg/dL — ABNORMAL HIGH (ref 6–23)
CO2: 16 mmol/L — ABNORMAL LOW (ref 19–32)
Calcium: 8.1 mg/dL — ABNORMAL LOW (ref 8.4–10.5)
Chloride: 120 mmol/L — ABNORMAL HIGH (ref 96–112)
Creatinine, Ser: 1.63 mg/dL — ABNORMAL HIGH (ref 0.50–1.10)
GFR calc Af Amer: 33 mL/min — ABNORMAL LOW
GFR calc non Af Amer: 29 mL/min — ABNORMAL LOW
Glucose, Bld: 136 mg/dL — ABNORMAL HIGH (ref 70–99)
Potassium: 4 mmol/L (ref 3.5–5.1)
Sodium: 143 mmol/L (ref 135–145)

## 2014-05-26 LAB — GLUCOSE, CAPILLARY
GLUCOSE-CAPILLARY: 138 mg/dL — AB (ref 70–99)
Glucose-Capillary: 109 mg/dL — ABNORMAL HIGH (ref 70–99)
Glucose-Capillary: 109 mg/dL — ABNORMAL HIGH (ref 70–99)
Glucose-Capillary: 118 mg/dL — ABNORMAL HIGH (ref 70–99)
Glucose-Capillary: 141 mg/dL — ABNORMAL HIGH (ref 70–99)
Glucose-Capillary: 89 mg/dL (ref 70–99)

## 2014-05-26 LAB — CBC
HEMATOCRIT: 22.6 % — AB (ref 36.0–46.0)
HEMOGLOBIN: 7.1 g/dL — AB (ref 12.0–15.0)
MCH: 28.7 pg (ref 26.0–34.0)
MCHC: 31.4 g/dL (ref 30.0–36.0)
MCV: 91.5 fL (ref 78.0–100.0)
Platelets: 178 10*3/uL (ref 150–400)
RBC: 2.47 MIL/uL — AB (ref 3.87–5.11)
RDW: 16.2 % — ABNORMAL HIGH (ref 11.5–15.5)
WBC: 10.4 10*3/uL (ref 4.0–10.5)

## 2014-05-26 MED ORDER — FUROSEMIDE 10 MG/ML IJ SOLN
40.0000 mg | Freq: Once | INTRAMUSCULAR | Status: AC
  Administered 2014-05-26: 40 mg via INTRAVENOUS
  Filled 2014-05-26: qty 4

## 2014-05-26 NOTE — Procedures (Signed)
Extubation Procedure Note  Patient Details:   Name: [name] DOB: 08/30/1932 MRN: [id number]   Airway Documentation:  AIRWAYS (Active)    Evaluation  O2 sats: stable throughout Complications: No apparent complications Patient did tolerate procedure well. Bilateral Breath Sounds: Diminished Suctioning: Airway No   Patient extubated to 2 LNC at this time per MD order. Tolerated well. Not following any commands or speaking post extubation. RT to monitor as needed.  Lurlean LeydenDick, Brean Carberry Bailey 05/26/2014, 9:00 AM

## 2014-05-26 NOTE — Progress Notes (Signed)
Patient ID: Olivia GangHelen L Goettl, female   DOB: 04/15/1932, 81 y.o.   MRN: [id number] Open eyes, moves all 4 extremities. Some drainage. CCM to extubate today. Will do a ct head in am

## 2014-05-26 NOTE — Progress Notes (Signed)
PULMONARY / CRITICAL CARE MEDICINE   Name: [name] MRN: [id number] DOB: 09/20/1932    ADMISSION DATE:  05/23/2014 CONSULTATION DATE:  05/22/2013  REFERRING MD :  Dr. Jeral FruitBotero  CHIEF COMPLAINT:  SDH  INITIAL PRESENTATION:  81 yo female s/p fall with SDH and mental status change.  Had evacuation of hematoma 3/28 and remained on vent post-op.  STUDIES:  3/25 CT head >> increased size of SDH with 7 mm Rt to Lt shift  SIGNIFICANT EVENTS: 3/28 Admit, evacuation of hematoma  SUBJECTIVE:  Tolerating pressure support better.  More alert.  VITAL SIGNS: Temp:  [98 F (36.7 C)-99.9 F (37.7 C)] 98 F (36.7 C) (03/31 0343) Pulse Rate:  [71-106] 86 (03/31 0830) Resp:  [10-17] 16 (03/31 0830) BP: (109-196)/(22-98) 152/67 mmHg (03/31 0830) SpO2:  [100 %] 100 % (03/31 0830) FiO2 (%):  [30 %] 30 % (03/31 0830) Weight:  [114 lb 6.7 oz (51.9 kg)-114 lb 13.8 oz (52.1 kg)] 114 lb 13.8 oz (52.1 kg) (03/31 0600) VENTILATOR SETTINGS: Vent Mode:  [-] CPAP;PSV FiO2 (%):  [30 %] 30 % Set Rate:  [14 bmp] 14 bmp Vt Set:  [420 mL] 420 mL PEEP:  [5 cmH20] 5 cmH20 Pressure Support:  [8 cmH20] 8 cmH20 Plateau Pressure:  [15 cmH20-17 cmH20] 17 cmH20 INTAKE / OUTPUT:  Intake/Output Summary (Last 24 hours) at 05/26/14 0840 Last data filed at 05/26/14 0800  Gross per 24 hour  Intake 2026.58 ml  Output    666 ml  Net 1360.58 ml    PHYSICAL EXAMINATION: General: no distress Neuro: opens eyes spontaneously, follows some simple commands HEENT:  Wound site clean, ETT in place Cardiovascular: regular Lungs: no wheeze Abdomen:  Soft, non-tender Musculoskeletal: no edema Skin: no rashes  LABS:  CBC  Recent Labs Lab 05/23/14 2120 05/24/14 0510 05/26/14 0500  WBC 5.1 7.0 10.4  HGB 7.7* 7.7* 7.1*  HCT 23.6* 23.4* 22.6*  PLT 199 184 178   BMET  Recent Labs Lab 05/23/14 2120 05/24/14 0510 05/26/14 0500  NA 138 138 143  K 4.1 4.3 4.0  CL 111 112 120*  CO2 19 19 16*  BUN 37* 38*  33*  CREATININE 1.79* 1.69* 1.63*  GLUCOSE 97 86 136*   Electrolytes  Recent Labs Lab 05/23/14 2120 05/24/14 0510 05/26/14 0500  CALCIUM 7.8* 7.8* 8.1*   ABG  Recent Labs Lab 05/23/14 2045 05/24/14 0357  PHART 7.426 7.372  PCO2ART 27.6* 32.2*  PO2ART 216.0* 159.0*   Liver Enzymes  Recent Labs Lab 05/23/14 1339  AST 19  ALT 11  ALKPHOS 74  BILITOT 1.3*  ALBUMIN 3.5   Glucose  Recent Labs Lab 05/25/14 1141 05/25/14 1541 05/25/14 1934 05/25/14 2323 05/26/14 0345 05/26/14 0755  GLUCAP 81 96 97 147* 109* 118*    Imaging Dg Abd Portable 1v  05/25/2014   CLINICAL DATA:  Feeding tube placement.  EXAM: PORTABLE ABDOMEN - 1 VIEW  COMPARISON:  None.  FINDINGS: Feeding tube tip is in the fundus of the stomach. Bowel gas pattern is normal. No acute osseous abnormality. Bilateral total hip prostheses. Previous cholecystectomy.  IMPRESSION: Feeding tube tip in the fundus of the stomach.   Electronically Signed   By: Francene BoyersJames  Maxwell M.D.   On: 05/25/2014 12:44     ASSESSMENT / PLAN:  NEUROLOGIC A:   Subacute SDH s/p decompressive craniotomy 3/28. Hx of depression, anxiety. P:   RASS goal:0 AED's, post-op care per neurosurgery Hold outpt remeron  PULMONARY ETT 3/28 >>  A: Acute respiratory failure 2nd to altered mental status. Tobacco abuse. P:   Proceed with extubation 3/31  CARDIOVASCULAR  A:  Hx of HTN, HLD. Hx of chronic systolic CHF >> EF 35 to 40% on Echo 02/13/14. P:  Continue lipitor, lopressor Lasix 40 mg IV x one on 3/31 Hold outpt catepres, hydralazine, imdur for now No ASA due to SDH  RENAL A:   CKD stage 3 >> baseline creatinine 2.08 from 02/17/14. Hx of irritable bladder. P:   Monitor renal fx, urine outpt Hold outpt ditropan  GASTROINTESTINAL A:   Nutrition. P:   Tube feeds while on vent Protonix for SUP  HEMATOLOGIC A:   Anemia of critical illness and chronic disease. Hx of thrombocytopenia. P:  F/u CBC SCD's for DVT  prevention  INFECTIOUS A:   UTI. P:   Day 2 rocephin  Urine 3/29 >> GNR >>  ENDOCRINE A:   No acute issues. P:   SSI while on tube feeds  Updated family at bedside 3/31.  CC time 35 minutes.  Coralyn Helling, MD [facility] Pulmonary/Critical Care 05/26/2014, 8:40 AM Pager:  [phone] After 3pm call: [phone]

## 2014-05-26 NOTE — Clinical Documentation Improvement (Signed)
Please specify diagnosis related to below supporting information, if appropriate.   Possible Clinical Conditions? Severe Malnutrition   Protein Calorie Malnutrition Severe Protein Calorie Malnutrition Cachexia   Other Condition Cannot clinically determine  Supporting Information: PROGRESS 05/25/2014    -Severe malnutrition in the context of chronic illness  NUTRITION DIAGNOSIS:  Malnutrition related to chronic illness as evidenced by severe muscle depletion and intake of </= 75% of her needs for >/= 1 month; ongoing. Reason for Assessment:  Ventilator; Pt identified as at nutrition risk on the Malnutrition Screen Tool.  PROGRESS 05/24/2014    -Severe malnutrition in the context of chronic illness  NUTRITION DIAGNOSIS:  Malnutrition related to chronic illness as evidenced by severe muscle depletion and intake of </= 75% of her needs for >/= 1 month. Reason for Assessment:  Ventilator; Pt identified as at nutrition risk on the Malnutrition Screen Tool.     Thank You, Shelda Palarlene H Deaundra Kutzer ,RN Clinical Documentation Specialist:  [phone]  [facility]- Health Information Management

## 2014-05-27 ENCOUNTER — Inpatient Hospital Stay (HOSPITAL_COMMUNITY): Payer: Medicare Other

## 2014-05-27 DIAGNOSIS — Z515 Encounter for palliative care: Secondary | ICD-10-CM

## 2014-05-27 DIAGNOSIS — R0600 Dyspnea, unspecified: Secondary | ICD-10-CM

## 2014-05-27 LAB — CBC
HEMATOCRIT: 22.4 % — AB (ref 36.0–46.0)
Hemoglobin: 7.3 g/dL — ABNORMAL LOW (ref 12.0–15.0)
MCH: 29.9 pg (ref 26.0–34.0)
MCHC: 32.6 g/dL (ref 30.0–36.0)
MCV: 91.8 fL (ref 78.0–100.0)
Platelets: 170 10*3/uL (ref 150–400)
RBC: 2.44 MIL/uL — ABNORMAL LOW (ref 3.87–5.11)
RDW: 17 % — AB (ref 11.5–15.5)
WBC: 10.6 10*3/uL — ABNORMAL HIGH (ref 4.0–10.5)

## 2014-05-27 LAB — BLOOD GAS, ARTERIAL
Acid-base deficit: 7.2 mmol/L — ABNORMAL HIGH (ref 0.0–2.0)
Bicarbonate: 17.6 mEq/L — ABNORMAL LOW (ref 20.0–24.0)
Drawn by: 275531
FIO2: 0.21 %
O2 Saturation: 90.9 %
PATIENT TEMPERATURE: 98.6
PH ART: 7.331 — AB (ref 7.350–7.450)
TCO2: 18.7 mmol/L (ref 0–100)
pCO2 arterial: 34.3 mmHg — ABNORMAL LOW (ref 35.0–45.0)
pO2, Arterial: 64.3 mmHg — ABNORMAL LOW (ref 80.0–100.0)

## 2014-05-27 LAB — BASIC METABOLIC PANEL
Anion gap: 6 (ref 5–15)
BUN: 40 mg/dL — ABNORMAL HIGH (ref 6–23)
CHLORIDE: 117 mmol/L — AB (ref 96–112)
CO2: 21 mmol/L (ref 19–32)
Calcium: 7.6 mg/dL — ABNORMAL LOW (ref 8.4–10.5)
Creatinine, Ser: 1.59 mg/dL — ABNORMAL HIGH (ref 0.50–1.10)
GFR calc Af Amer: 35 mL/min — ABNORMAL LOW (ref >90)
GFR calc non Af Amer: 30 mL/min — ABNORMAL LOW (ref >90)
GLUCOSE: 151 mg/dL — AB (ref 70–99)
Potassium: 4.2 mmol/L (ref 3.5–5.1)
Sodium: 144 mmol/L (ref 135–145)

## 2014-05-27 LAB — URINE CULTURE: Colony Count: 40000

## 2014-05-27 LAB — GLUCOSE, CAPILLARY
GLUCOSE-CAPILLARY: 122 mg/dL — AB (ref 70–99)
Glucose-Capillary: 124 mg/dL — ABNORMAL HIGH (ref 70–99)

## 2014-05-27 MED ORDER — FUROSEMIDE 10 MG/ML IJ SOLN
40.0000 mg | Freq: Once | INTRAMUSCULAR | Status: AC
  Administered 2014-05-27: 40 mg via INTRAVENOUS
  Filled 2014-05-27: qty 4

## 2014-05-27 MED ORDER — ACETAMINOPHEN 650 MG RE SUPP: 650.0000 mg | RECTAL | Status: DC | PRN

## 2014-05-27 MED ORDER — LORAZEPAM 1 MG PO TABS: 1.0000 mg | ORAL_TABLET | ORAL | Status: DC | PRN

## 2014-05-27 MED ORDER — IPRATROPIUM-ALBUTEROL 0.5-2.5 (3) MG/3ML IN SOLN
3.0000 mL | Freq: Three times a day (TID) | RESPIRATORY_TRACT | Status: DC
  Administered 2014-05-28: 3 mL via RESPIRATORY_TRACT
  Filled 2014-05-27: qty 3

## 2014-05-27 MED ORDER — MORPHINE SULFATE (CONCENTRATE) 10 MG/0.5ML PO SOLN
5.0000 mg | ORAL | Status: DC | PRN
  Administered 2014-05-28: 10 mg via ORAL
  Filled 2014-05-27: qty 0.5

## 2014-05-27 MED ORDER — IPRATROPIUM-ALBUTEROL 0.5-2.5 (3) MG/3ML IN SOLN: 3.0000 mL | RESPIRATORY_TRACT | Status: DC

## 2014-05-27 MED ORDER — CIPROFLOXACIN IN D5W 400 MG/200ML IV SOLN
400.0000 mg | INTRAVENOUS | Status: DC
  Filled 2014-05-27 (×2): qty 200

## 2014-05-27 MED ORDER — VITAL AF 1.2 CAL PO LIQD
1000.0000 mL | ORAL | Status: DC
Start: 2014-05-27 — End: 2014-05-27
  Filled 2014-05-27 (×2): qty 1000

## 2014-05-27 MED ORDER — LORAZEPAM 2 MG/ML PO CONC: 1.0000 mg | ORAL | Status: DC | PRN

## 2014-05-27 MED ORDER — ALBUTEROL SULFATE (2.5 MG/3ML) 0.083% IN NEBU: 2.5000 mg | INHALATION_SOLUTION | RESPIRATORY_TRACT | Status: DC | PRN

## 2014-05-27 MED ORDER — IPRATROPIUM-ALBUTEROL 0.5-2.5 (3) MG/3ML IN SOLN
3.0000 mL | Freq: Four times a day (QID) | RESPIRATORY_TRACT | Status: DC
Start: 2014-05-27 — End: 2014-05-27
  Administered 2014-05-27 (×2): 3 mL via RESPIRATORY_TRACT
  Filled 2014-05-27 (×2): qty 3

## 2014-05-27 MED ORDER — ACETAMINOPHEN 160 MG/5ML PO SOLN: 650.0000 mg | Freq: Four times a day (QID) | ORAL | Status: DC | PRN

## 2014-05-27 MED ORDER — PROMETHAZINE HCL 25 MG/ML IJ SOLN: 12.5000 mg | INTRAMUSCULAR | Status: DC | PRN

## 2014-05-27 MED ORDER — ATROPINE SULFATE 1 % OP SOLN
4.0000 [drp] | OPHTHALMIC | Status: DC | PRN
  Filled 2014-05-27 (×2): qty 2

## 2014-05-27 NOTE — Consult Note (Signed)
Patient [name]      DOB: 1932/12/28      QZY:[id number]     Consult Note from the Palliative Medicine Team at [facility]    Consult Requested by: Dr. Craige Cotta     PCP: Delorse Lek, MD Reason for Consultation: GOC     Phone Number:[phone]  Assessment of patients Current state: I have met today at Ms. Loconte's bedside with her children, Jasmine December and [place], and sister, Nellie. They would very much like to get her home with hospice as soon as possible. They understand her poor prognosis and I did tell them she may only have days to live. They are very supportive of each other and believe that she would want to be at home. We discussed plan to transition to full comfort today while we set up hospice and have equipment delivered for hopeful home with hospice tomorrow 4/2. Discussed d/c of drain with Dr. Jeral Fruit and he will plan to d/c today. Will also d/c NGT and tube feedings today. Family also understands that with further decline it may not be appropriate to transfer her to home and they are okay with that but would like her to die at home if at all possible. I have discussed with CMRN and paged Dr. Craige Cotta with PCCM.    Goals of Care: 1.  Code Status: DNR   2. Disposition: Hopeful for home with hospice.    3. Symptom Management:  1. Dyspnea/pain: Roxanol 5-10 mg every hour prn.   2. Seizure prophylaxis: Recommend lorazepam 1 mg solution TID and prn.   3. Anxiety: Recommend lorazepam 1 mg every 4 hours prn.   4. Terminal secretions: Atropine SL every 4 hours prn. May also add scopolamine patch prn.     4. Psychosocial: Emotional support provided to patient and family at bedside.   5. Spiritual: Family is spiritual and will benefit from continued spiritual care support.    Brief HPI: 81 yo female readmitted with worsening SDH after fall and mental status changes. Seemed to be having worsening dementia and failure to thrive from weight loss, decreased functional status, sleeping all  the time according to family. Worsening SDH with 7 mm right to left shift CT 3/25 requiring craniotomy and drain. CT 3/31 new right temporal lobe infarct. She was extubated with worsening mental status and is now completely unresponsive. PMH reviewed below.    ROS: Unable to assess - unresponsive.     PMH:  Past Medical History  Diagnosis Date  . Hypertension   . Anxiety and depression   . Chronic headaches   . Diverticular disease   . Anemia   . Arthritis     osteoarthritis  . Osteoporosis   . Vitamin B 12 deficiency   . History of ITP   . Anxiety     takes xanax for sleep  . Diabetes mellitus     NIDDM x 12 years  . Heart murmur     benign;asymptomatic  . Chronic kidney disease     ESRD - AV fistula in LUE (Dr. Carlean Jews = nephrologist)  . Left knee pain 02/08/2013  . Cachexia 02/08/2013  . Hyperlipidemia   . Tremors of nervous system   . Constipated 09/30/2013  . Left ventricular dysfunction      PSH: Past Surgical History  Procedure Laterality Date  . Av fistula placement  11/15/10    left upper arm AVF  . Abdominal hysterectomy  1973  . Joint replacement  2007  rt hip  . Eye surgery    . Cataract extraction w/ intraocular lens  implant, bilateral  2002  . Vascular surgery  10-2010    left AVF  . Hernia repair  2000    ventral  . Ventral hernia repair  08/06/2011    Procedure: HERNIA REPAIR VENTRAL ADULT;  Surgeon: Zenovia Jarred, MD;  Location: [place];  Service: General;  Laterality: N/A;  Open Repair [place] with Mesh  . Cholecystectomy  yrs ago  . Total hip arthroplasty Left 04/27/2013    Procedure: LEFT TOTAL HIP ARTHROPLASTY ANTERIOR APPROACH;  Surgeon: Mauri Pole, MD;  Location: [facility];  Service: Orthopedics;  Laterality: Left;  . Craniotomy N/A 05/23/2014    Procedure: CRANIOTOMY HEMATOMA EVACUATION SUBDURAL;  Surgeon: Leeroy Cha, MD;  Location: [place] [facility];  Service: Neurosurgery;  Laterality: N/A;  CRANIOTOMY HEMATOMA  EVACUATION SUBDURAL   I have reviewed the FH and SH and  If appropriate update it with new information. Allergies  Allergen Reactions  . Other Other (See Comments)    No Narcotics meds-ONLY TYLENOLper family request   Scheduled Meds: . antiseptic oral rinse  7 mL Mouth Rinse QID  . chlorhexidine  15 mL Mouth Rinse BID  . ciprofloxacin  400 mg Intravenous Q24H  . ipratropium-albuterol  3 mL Nebulization Q6H  . levETIRAcetam  500 mg Intravenous Q12H  . pantoprazole sodium  40 mg Per Tube Q24H   Continuous Infusions: . sodium chloride 50 mL/hr at 05/27/14 1100   PRN Meds:.acetaminophen (TYLENOL) oral liquid 160 mg/5 mL, acetaminophen, albuterol, labetalol, LORazepam, morphine CONCENTRATE, [DISCONTINUED] ondansetron **OR** ondansetron (ZOFRAN) IV, promethazine    BP 161/70 mmHg  Pulse 98  Temp(Src) 99.4 F (37.4 C) (Axillary)  Resp 20  Ht $R'5\' 3"'Iy$  (1.6 m)  Wt 52.2 kg (115 lb 1.3 oz)  BMI 20.39 kg/m2  SpO2 99%   PPS: 10%   Intake/Output Summary (Last 24 hours) at 05/27/14 1325 Last data filed at 05/27/14 1100  Gross per 24 hour  Intake   2085 ml  Output   1590 ml  Net    495 ml    Physical Exam:  General: NAD, lying in bed HEENT: Temporal muscle wasting, no JVD, dry mucous membranes Chest: No labored breathing, rhonchi throughout, abnormal breathing pattern CVS: RRR Abdomen: Soft, NT, ND, +BS Ext: No edema, warm to touch Neuro: Unresponsive  Labs: CBC    Component Value Date/Time   WBC 10.6* 05/27/2014 0435   WBC 6.7 05/19/2014 1308   RBC 2.44* 05/27/2014 0435   RBC 2.88* 05/19/2014 1308   RBC 2.81* 05/13/2014 1926   HGB 7.3* 05/27/2014 0435   HGB 8.1* 05/19/2014 1308   HCT 22.4* 05/27/2014 0435   HCT 25.5* 05/19/2014 1308   PLT 170 05/27/2014 0435   PLT 197 05/19/2014 1308   MCV 91.8 05/27/2014 0435   MCV 88.6 05/19/2014 1308   MCH 29.9 05/27/2014 0435   MCH 28.2 05/19/2014 1308   MCHC 32.6 05/27/2014 0435   MCHC 31.9 05/19/2014 1308   RDW 17.0*  05/27/2014 0435   RDW 14.7* 05/19/2014 1308   LYMPHSABS 1.2 05/24/2014 0510   LYMPHSABS 1.5 05/19/2014 1308   MONOABS 0.7 05/24/2014 0510   MONOABS 0.7 05/19/2014 1308   EOSABS 0.2 05/24/2014 0510   EOSABS 0.3 05/19/2014 1308   BASOSABS 0.0 05/24/2014 0510   BASOSABS 0.1 05/19/2014 1308    BMET    Component Value Date/Time   NA 144 05/27/2014 0435  NA 137 08/10/2013 1009   K 4.2 05/27/2014 0435   K 5.0 08/10/2013 1009   CL 117* 05/27/2014 0435   CL 106 07/10/2012 0909   CO2 21 05/27/2014 0435   CO2 20* 08/10/2013 1009   GLUCOSE 151* 05/27/2014 0435   GLUCOSE 99 08/10/2013 1009   GLUCOSE 102* 07/10/2012 0909   BUN 40* 05/27/2014 0435   BUN 60.3* 08/10/2013 1009   CREATININE 1.59* 05/27/2014 0435   CREATININE 2.5* 08/10/2013 1009   CALCIUM 7.6* 05/27/2014 0435   CALCIUM 8.8 08/10/2013 1009   GFRNONAA 30* 05/27/2014 0435   GFRAA 35* 05/27/2014 0435    CMP     Component Value Date/Time   NA 144 05/27/2014 0435   NA 137 08/10/2013 1009   K 4.2 05/27/2014 0435   K 5.0 08/10/2013 1009   CL 117* 05/27/2014 0435   CL 106 07/10/2012 0909   CO2 21 05/27/2014 0435   CO2 20* 08/10/2013 1009   GLUCOSE 151* 05/27/2014 0435   GLUCOSE 99 08/10/2013 1009   GLUCOSE 102* 07/10/2012 0909   BUN 40* 05/27/2014 0435   BUN 60.3* 08/10/2013 1009   CREATININE 1.59* 05/27/2014 0435   CREATININE 2.5* 08/10/2013 1009   CALCIUM 7.6* 05/27/2014 0435   CALCIUM 8.8 08/10/2013 1009   PROT 6.9 05/23/2014 1339   PROT 5.9* 08/10/2013 1009   ALBUMIN 3.5 05/23/2014 1339   ALBUMIN 3.2* 08/10/2013 1009   AST 19 05/23/2014 1339   AST 11 08/10/2013 1009   ALT 11 05/23/2014 1339   ALT 6 08/10/2013 1009   ALKPHOS 74 05/23/2014 1339   ALKPHOS 54 08/10/2013 1009   BILITOT 1.3* 05/23/2014 1339   BILITOT 0.50 08/10/2013 1009   GFRNONAA 30* 05/27/2014 0435   GFRAA 35* 05/27/2014 0435    Time In Time Out Total Time Spent with Patient Total Overall Time  1215 1315 46min 41min    Greater than  50%  of this time was spent counseling and coordinating care related to the above assessment and plan.  Vinie Sill, NP Palliative Medicine Team Pager # [phone] (M-F 8a-5p) Team Phone # [phone] (Nights/Weekends)

## 2014-05-27 NOTE — Care Management Note (Signed)
  Page 1 of 1   05/27/2014     2:02:20 PM CARE MANAGEMENT NOTE 05/27/2014  Patient:  [name],[name]   Account Number:  [id number]  Date Initiated:  05/27/2014  Documentation initiated by:  Ronny FlurryWILE,Yonatan Guitron  Subjective/Objective Assessment:     Action/Plan:   Anticipated DC Date:  05/28/2014   Anticipated DC Plan:  HOME W HOSPICE CARE  In-house referral  Clinical Social Worker         Choice offered to / List presented to:  C-4 Adult Children           HH agency  [facility]   Status of service:  Completed, signed off Medicare Important Message given?  YES (If response is "NO", the following Medicare IM given date fields will be blank) Date Medicare IM given:  05/27/2014 Medicare IM given by:  Ronny FlurryWILE,Alfredo Spong Date Additional Medicare IM given:   Additional Medicare IM given by:    Discharge Disposition:  HOME W HOSPICE CARE  Per UR Regulation:  Reviewed for med. necessity/level of care/duration of stay  If discussed at Long Length of Stay Meetings, dates discussed:    Comments:  05-27-14 Spoke with patient's sister , daughter [name] , and son [name] [phone] cell at bedside. Confirmed face sheet information . [name] will be with patinet at all times at home. Family requesting hospital bed and ambulance transport home.  SW referral for ambulance transport home entered.  [facility] .  Patient's PCP is Dr Marjory LiesBrent Burnett.  Ronny FlurryHeather Shauniece Kwan RN BSN [phone]

## 2014-05-27 NOTE — Progress Notes (Addendum)
Notified by Herbert SetaHeather, [facility] of family request [facility] at [facility] after discharge. Chart and patient information reviewed with Dr. Smith MinceMazzocchi and hospice eligibility confirmed.  Writer came to the pt's bedside and telephone call made to daughter to briefly initiate education related to hospital philosophy, services and team approach to care. Dtr. voiced understanding of information provided. DME needs discussed and and family requested a bed, table and APP to be delivered to the home tonight. [facility] equipment manager Jewel Kizzie BaneHughes notified and will contact [facility] to arrange delivery to the home. The home address has been verified and contact number for dtr. provided.  Please send a completed signed DNR home with patient as well as scripts for comfort meds. Please notify [facility] when pt. ready to discharge from unit at [phone].  Please call with any hospice concerns.  Sharen HeckLisa Strandberg RN [facility] Hospital Liaison [phone]

## 2014-05-27 NOTE — Progress Notes (Signed)
ANTIBIOTIC CONSULT NOTE - INITIAL  Pharmacy Consult for cipro Indication: UTI  Allergies  Allergen Reactions  . Other Other (See Comments)    No Narcotics meds-ONLY TYLENOLper family request    Patient Measurements: Height:  (160 cm) Weight: 115 lb 1.3 oz (52.2 kg) IBW/kg (Calculated) : 52.4 Adjusted Body Weight:   Vital Signs: Temp: 99.4 F (37.4 C) (04/01 0400) Temp Source: Axillary (04/01 0400) BP: 184/78 mmHg (04/01 0940) Pulse Rate: 104 (04/01 0940) Intake/Output from previous day: 03/31 0701 - 04/01 0700 In: 2105 [I.V.:1150; NG/GT:805; IV Piggyback:150] Out: 2185 [Urine:2165; Drains:20] Intake/Output from this shift:    Labs:  Recent Labs  05/26/14 0500 05/27/14 0435  WBC 10.4 10.6*  HGB 7.1* 7.3*  PLT 178 170  CREATININE 1.63* 1.59*   Estimated Creatinine Clearance: 23.6 mL/min (by C-G formula based on Cr of 1.59). No results for input(s): VANCOTROUGH, VANCOPEAK, VANCORANDOM, GENTTROUGH, GENTPEAK, GENTRANDOM, TOBRATROUGH, TOBRAPEAK, TOBRARND, AMIKACINPEAK, AMIKACINTROU, AMIKACIN in the last 72 hours.   Microbiology: Recent Results (from the past 720 hour(s))  MRSA PCR Screening     Status: None   Collection Time: 05/10/14  7:02 PM  Result Value Ref Range Status   MRSA by PCR NEGATIVE NEGATIVE Final    Comment:        The GeneXpert MRSA Assay (FDA approved for NASAL specimens only), is one component of a comprehensive MRSA colonization surveillance program. It is not intended to diagnose MRSA infection nor to guide or monitor treatment for MRSA infections.   Urine culture     Status: None   Collection Time: 05/12/14 10:30 AM  Result Value Ref Range Status   Specimen Description URINE, CATHETERIZED  Final   Special Requests NONE  Final   Colony Count   Final    >=100,000 COLONIES/ML Performed at [facility]    Culture   Final    KLEBSIELLA PNEUMONIAE ESCHERICHIA COLI Performed at [facility]    Report Status  05/16/2014 FINAL  Final   Organism ID, Bacteria KLEBSIELLA PNEUMONIAE  Final   Organism ID, Bacteria ESCHERICHIA COLI  Final      Susceptibility   Escherichia coli - MIC*    AMPICILLIN >=32 RESISTANT Resistant     CEFAZOLIN <=4 SENSITIVE Sensitive     CEFTRIAXONE <=1 SENSITIVE Sensitive     CIPROFLOXACIN >=4 RESISTANT Resistant     GENTAMICIN <=1 SENSITIVE Sensitive     LEVOFLOXACIN >=8 RESISTANT Resistant     NITROFURANTOIN <=16 SENSITIVE Sensitive     TOBRAMYCIN <=1 SENSITIVE Sensitive     TRIMETH/SULFA <=20 SENSITIVE Sensitive     PIP/TAZO <=4 SENSITIVE Sensitive     * ESCHERICHIA COLI   Klebsiella pneumoniae - MIC*    AMPICILLIN RESISTANT      CEFAZOLIN <=4 SENSITIVE Sensitive     CEFTRIAXONE <=1 SENSITIVE Sensitive     CIPROFLOXACIN <=0.25 SENSITIVE Sensitive     GENTAMICIN <=1 SENSITIVE Sensitive     LEVOFLOXACIN <=0.12 SENSITIVE Sensitive     NITROFURANTOIN 64 INTERMEDIATE Intermediate     TOBRAMYCIN <=1 SENSITIVE Sensitive     TRIMETH/SULFA <=20 SENSITIVE Sensitive     PIP/TAZO <=4 SENSITIVE Sensitive     * KLEBSIELLA PNEUMONIAE  MRSA PCR Screening     Status: None   Collection Time: 05/23/14  8:17 PM  Result Value Ref Range Status   MRSA by PCR NEGATIVE NEGATIVE Final    Comment:        The GeneXpert MRSA Assay (FDA approved for  NASAL specimens only), is one component of a comprehensive MRSA colonization surveillance program. It is not intended to diagnose MRSA infection nor to guide or monitor treatment for MRSA infections.   Culture, Urine     Status: None   Collection Time: 05/24/14  1:20 AM  Result Value Ref Range Status   Specimen Description URINE, CATHETERIZED  Final   Special Requests NONE  Final   Colony Count   Final    40,000 COLONIES/ML [facility]formed at [facility]    Culture   Final    ENTEROBACTER CLOACAE Performed at [facility]    Report Status 05/27/2014 FINAL  Final   Organism ID, Bacteria ENTEROBACTER CLOACAE  Final       Susceptibility   Enterobacter cloacae - MIC*    CEFAZOLIN >=64 RESISTANT Resistant     CEFTRIAXONE >=64 RESISTANT Resistant     CIPROFLOXACIN <=0.25 SENSITIVE Sensitive     GENTAMICIN <=1 SENSITIVE Sensitive     LEVOFLOXACIN 1 SENSITIVE Sensitive     NITROFURANTOIN 64 INTERMEDIATE Intermediate     TOBRAMYCIN <=1 SENSITIVE Sensitive     TRIMETH/SULFA <=20 SENSITIVE Sensitive     PIP/TAZO >=128 RESISTANT Resistant     * ENTEROBACTER CLOACAE    Medical History: Past Medical History  Diagnosis Date  . Hypertension   . Anxiety and depression   . Chronic headaches   . Diverticular disease   . Anemia   . Arthritis     osteoarthritis  . Osteoporosis   . Vitamin B 12 deficiency   . History of ITP   . Anxiety     takes xanax for sleep  . Diabetes mellitus     NIDDM x 12 years  . Heart murmur     benign;asymptomatic  . Chronic kidney disease     ESRD - AV fistula in LUE (Dr. Carlean Jewsynrthia Dunham = nephrologist)  . Left knee pain 02/08/2013  . Cachexia 02/08/2013  . Hyperlipidemia   . Tremors of nervous system   . Constipated 09/30/2013  . Left ventricular dysfunction     Medications:  Anti-infectives    Start     Dose/Rate Route Frequency Ordered Stop   05/27/14 1100  ciprofloxacin (CIPRO) IVPB 400 mg     400 mg 200 mL/hr over 60 Minutes Intravenous Every 24 hours 05/27/14 0945     05/25/14 1000  cefTRIAXone (ROCEPHIN) 1 g in dextrose 5 % 50 mL IVPB - Premix     1 g 100 mL/hr over 30 Minutes Intravenous Every 24 hours 05/25/14 0947       Assessment: 81 yof admitted to the hospital for SDH. Initially started on ceftriaxone for UTI. However, cultures returned showing enterobacter that is resistant to ceftriaxone. Now changing to ciprofloxacin. Scr is elevated but stable and appears to be at baseline. Pt is afebrile and WBC is mildly elevated at 10.6.   Goal of Therapy:  Eradication of infection  Plan:  - Cipro 400mg  IV Q24H - F/u renal fxn, C&S, clinical status    *Pharmacy will sign-off as there are no dose adjustment anticipated. Will monitor peripherally. Thank you for the consult!  Lysle Pearlachel Trendon Zaring, PharmD, BCPS Pager # [phone] 05/27/2014 9:49 AM

## 2014-05-27 NOTE — Progress Notes (Addendum)
PULMONARY / CRITICAL CARE MEDICINE   Name: [name] MRN: [id number] DOB: [birth year]    ADMISSION DATE:  05/23/2014 CONSULTATION DATE:  05/22/2013  REFERRING MD :  Dr. Jeral Fruit  CHIEF COMPLAINT:  SDH  INITIAL PRESENTATION:  81 yo female s/p fall with SDH and mental status change.  Had evacuation of hematoma 3/28 and remained on vent post-op.  STUDIES:  3/25 CT head >> increased size of SDH with 7 mm Rt to Lt shift 3/31 CT head >> New Rt temporal lobe infarct  SIGNIFICANT EVENTS: 3/28 Admit, evacuation of hematoma  SUBJECTIVE:  Mental status worse this AM.    VITAL SIGNS: Temp:  [98.4 F (36.9 C)-99.4 F (37.4 C)] 99.4 F (37.4 C) (04/01 0400) Pulse Rate:  [49-102] 96 (04/01 0700) Resp:  [13-20] 20 (04/01 0700) BP: (144-173)/(53-81) 169/79 mmHg (04/01 0700) SpO2:  [96 %-100 %] 100 % (04/01 0700) Weight:  [115 lb 1.3 oz (52.2 kg)] 115 lb 1.3 oz (52.2 kg) (04/01 0500) VENTILATOR SETTINGS:   INTAKE / OUTPUT:  Intake/Output Summary (Last 24 hours) at 05/27/14 0920 Last data filed at 05/27/14 0700  Gross per 24 hour  Intake   1835 ml  Output   2165 ml  Net   -330 ml    PHYSICAL EXAMINATION: General: no distress Neuro: unresponsive HEENT:  Wound site clean Cardiovascular: regular Lungs: b/l rhonchi, abd breathing pattern Abdomen:  Soft, non-tender Musculoskeletal: no edema Skin: no rashes  LABS:  CBC  Recent Labs Lab 05/24/14 0510 05/26/14 0500 05/27/14 0435  WBC 7.0 10.4 10.6*  HGB 7.7* 7.1* 7.3*  HCT 23.4* 22.6* 22.4*  PLT 184 178 170   BMET  Recent Labs Lab 05/24/14 0510 05/26/14 0500 05/27/14 0435  NA 138 143 144  K 4.3 4.0 4.2  CL 112 120* 117*  CO2 19 16* 21  BUN 38* 33* 40*  CREATININE 1.69* 1.63* 1.59*  GLUCOSE 86 136* 151*   Electrolytes  Recent Labs Lab 05/24/14 0510 05/26/14 0500 05/27/14 0435  CALCIUM 7.8* 8.1* 7.6*   ABG  Recent Labs Lab 05/23/14 2045 05/24/14 0357  PHART 7.426 7.372  PCO2ART 27.6* 32.2*   PO2ART 216.0* 159.0*   Liver Enzymes  Recent Labs Lab 05/23/14 1339  AST 19  ALT 11  ALKPHOS 74  BILITOT 1.3*  ALBUMIN 3.5   Glucose  Recent Labs Lab 05/26/14 1156 05/26/14 1554 05/26/14 1933 05/26/14 2345 05/27/14 0327 05/27/14 0827  GLUCAP 141* 89 138* 109* 124* 122*    Imaging Ct Head Wo Contrast  05/26/2014   CLINICAL DATA:  81 year old female post fall 05/10/2014. Subdural hematoma post drainage. Follow-up. History of hypertension. Subsequent encounter.  EXAM: CT HEAD WITHOUT CONTRAST  TECHNIQUE: Contiguous axial images were obtained from the base of the skull through the vertex without intravenous contrast.  COMPARISON:  Several prior exams most recent 05/20/2014.  FINDINGS: Post right frontal craniotomy with drainage of right-sided subdural hematoma with drain in place. Residual right subdural fluid/ blood/ air collection with maximal thickness of 14.7 mm with mass effect upon the right frontal lobe and right lateral ventricle with midline shift to left by 7.8 mm and mild trapping of the left lateral ventricle with dilated left temporal horn.  Interval development of hypodensity throughout a majority of the mid to posterior right temporal lobe suggestive of infarct. Hyperdense linear structure anterior margin may reflect thrombosed right middle cerebral artery branch vessel.  Slight decrease in density of small right posterior parafalcine subdural collection with maximal thickness of  1 cm.  IMPRESSION: Post right frontal craniotomy and drainage of subdural collection with residual complex subdural collection measuring 14.7 mm with mass effect upon the right lateral ventricle and midline shift to the left by 7.8 mm which trapping of the temporal horn.  New right temporal lobe infarct with appearance of thrombosed right middle cerebral artery branch vessel.  Slight decrease in density of small right posterior parafalcine subdural collection with maximal thickness of 1 cm.  These  results will be called to the ordering clinician or representative by the Radiologist Assistant, and communication documented in the PACS or zVision Dashboard.   Electronically Signed   By: Lacy DuverneySteven  Olson M.D.   On: 05/26/2014 10:20   Dg Chest Port 1 View  05/26/2014   CLINICAL DATA:  Respiratory failure  EXAM: PORTABLE CHEST - 1 VIEW  COMPARISON:  Portable chest x-ray of 05/24/2014  FINDINGS: The tip of the endotracheal tube is approximately 1.9 cm above the carina. The right IJ central venous line tip overlies the lower SVC. Mild left basilar atelectasis has increased somewhat. Cardiomegaly is stable. A feeding tube remains.  IMPRESSION: 1. Slight increase in left basilar opacity most consistent with atelectasis. 2. Tip of endotracheal tube 1.9 cm above the carina.   Electronically Signed   By: Dwyane DeePaul  Barry M.D.   On: 05/26/2014 08:01     ASSESSMENT / PLAN:  NEUROLOGIC A:   Subacute SDH s/p decompressive craniotomy 3/28. New infarct Rt temporal lobe on CT head 3/31. Hx of depression, anxiety. P:   Defer to neurosurgery whether neurology consult also needed AED's, post-op care per neurosurgery Hold outpt remeron  PULMONARY ETT 3/28 >> 3/31 A: Acute respiratory failure 2nd to altered mental status >> respiratory status worse 4/01. Tobacco abuse. P:   Will need to d/w family goals of care Check ABG, f/u CXR Add scheduled BD's 4/01  CARDIOVASCULAR  A:  Hx of HTN, HLD. Hx of chronic systolic CHF >> EF 35 to 40% on Echo 02/13/14. P:  Continue lipitor, lopressor Lasix 40 mg IV x one on 4/01 Hold outpt catepres, hydralazine, imdur for now No ASA due to SDH  RENAL A:   CKD stage 3 >> baseline creatinine 2.08 from 02/17/14. Hx of irritable bladder. P:   Monitor renal fx, urine outpt Hold outpt ditropan  GASTROINTESTINAL A:   Nutrition. P:   Tube feeds while on vent Protonix for SUP  HEMATOLOGIC A:   Anemia of critical illness and chronic disease. Hx of  thrombocytopenia. P:  F/u CBC SCD's for DVT prevention  INFECTIOUS A:   UTI. P:   Day 3 of Abx, change to cipro 4/01  Urine 3/29 >> Enterobacter, resistant to rocephin  ENDOCRINE A:   No acute issues. P:   SSI while on tube feeds  Summary: Mental status and respiratory status worse 4/01.  Will check ABG, CXR, add BDs and give lasix.  D/w Dr. Jeral FruitBotero >> he will d/w family about goals of care regarding if she needs re-intubation.  CC time 35 minutes.  Coralyn HellingVineet Elivia Robotham, MD [facility] Pulmonary/Critical Care 05/27/2014, 9:20 AM Pager:  [phone] After 3pm call: [phone]

## 2014-05-27 NOTE — Progress Notes (Signed)
Spoke with pt's family at bedside.  Explained to them her current status, and concern about mental status and respiratory status.  They are in agreement to continue current therapies.  They also agree that she would not want to be put back on a ventilator or undergo cardiac resuscitation in the event of cardiac arrest >> DNR/DNI order placed.  Will continue current therapies, but no escalation of care.  Will ask palliative care team to assist with symptom management, and to also assist with hospice assessment if her medical status continues to get worse.  Family would like to transition her to home if possible if/when she transitions to hospice care.  Coralyn HellingVineet Jeneva Schweizer, MD [facility] Pulmonary/Critical Care 05/27/2014, 11:48 AM Pager:  [phone] After 3pm call: [phone]

## 2014-05-27 NOTE — Progress Notes (Signed)
OT Cancellation Note  Patient Details Name: Olivia GangHelen L Schrupp MRN: [id number] DOB: 11/27/1932   Cancelled Treatment:    Reason Eval/Treat Not Completed: Medical issues which prohibited therapy. Spoke with RN and she reports that family has decided to take pt home with comfort care. We will sign off at this time--please re-consult if needed.  Evette GeorgesLeonard, Rithy Mandley Eva [phone] 05/27/2014, 12:53 PM

## 2014-05-27 NOTE — Progress Notes (Signed)
PT Cancellation Note  Patient Details Name: Olivia GangHelen L Leffel MRN: [id number] DOB: 05/02/1932   Cancelled Treatment:    Reason Eval/Treat Not Completed: Medical issues which prohibited therapy.  Spoke with RN and she reports that family has decided to take pt home with comfort care. We will sign off at this time--please re-consult if needed. 05/27/2014  [place] BingKen Kirsta Probert, PT [phone]  (pager)   Nolie Bignell, Eliseo GumKenneth V 05/27/2014, 2:32 PM

## 2014-05-27 NOTE — Progress Notes (Signed)
Full note to follow:  I have met today at Ms. Hammer's bedside with her children, Ivin Booty and Ludwig Clarks, and sister, Tawanna Sat. They would very much like to get her home with hospice as soon as possible. They understands her poor prognosis and I did tell them she may only have days to live. They are very supportive of each other and believe that she would want to be at home. We discussed plan to transition to comfort today while we set up hospice and have equipment delivered for hopeful home with hospice tomorrow 4/2. Discussed d/c of drain with Dr. Joya Salm and he will plan to d/c today. Will also d/c NGT and tube feedings today. Family also understands that with further decline it may not be appropriate to transfer her to home and they are okay with that but would like her to die at home if at all possible. I have discussed with CMRN and paged Dr. Halford Chessman with PCCM.   Vinie Sill, NP Palliative Medicine Team Pager # [phone] (M-F 8a-5p) Team Phone # [phone] (Nights/Weekends)

## 2014-05-27 NOTE — Progress Notes (Signed)
NUTRITION FOLLOW-UP  DOCUMENTATION CODES Per approved criteria  -Severe malnutrition in the context of chronic illness   INTERVENTION:  Increase Vital 1.2 to 50 ml/hr via NGT (tip in the fundus of the stomach)  D/C prostat    Tube feeding regimen provides 1440 kcal, 90 grams of protein, and 973 ml of H2O.   NUTRITION DIAGNOSIS: Malnutrition related to chronic illness as evidenced by severe muscle depletion and intake of </= 75% of her needs for >/= 1 month; ongoing.   Goal: Pt to meet >/= 90% of their estimated nutrition needs; not met.    Monitor:  TF tolerance, weight trends, skin  ASSESSMENT: Pt hospitalized 3/15-3/19 after fall with SDH, medically managed. Pt admitted from PCP office with worsening SDH. S/P craniotomy 3/28.   Pt extubated 3/31, NG tube placed for continuation of TF.   Pt with skin breakdown on admission (PU stage II x 2).  BUN/Cr elevated Pt discussed during ICU rounds and with RN.   Height: Ht Readings from Last 1 Encounters:  05/26/14 $RemoveB'5\' 3"'kpbOiJZF$  (1.6 m)    Weight: Wt Readings from Last 1 Encounters:  05/27/14 115 lb 1.3 oz (52.2 kg)  Admission weight: 102 lb (46.7 kg) 3/28  BMI:  Body mass index is 20.39 kg/(m^2).  Estimated Nutritional Needs: Kcal: 1350-1500 Protein: 75-90 grams Fluid: > 1.5 L/day  Skin:  PU stage II on sacrum PU stage II on right buttocks  Diet Order: Diet NPO time specified   Intake/Output Summary (Last 24 hours) at 05/27/14 1030 Last data filed at 05/27/14 0945  Gross per 24 hour  Intake   1970 ml  Output   2165 ml  Net   -195 ml    Last BM: 3/31   Labs:   Recent Labs Lab 05/24/14 0510 05/26/14 0500 05/27/14 0435  NA 138 143 144  K 4.3 4.0 4.2  CL 112 120* 117*  CO2 19 16* 21  BUN 38* 33* 40*  CREATININE 1.69* 1.63* 1.59*  CALCIUM 7.8* 8.1* 7.6*  GLUCOSE 86 136* 151*    CBG (last 3)   Recent Labs  05/26/14 2345 05/27/14 0327 05/27/14 0827  GLUCAP 109* 124* 122*    Scheduled Meds: .  antiseptic oral rinse  7 mL Mouth Rinse QID  . atorvastatin  20 mg Per Tube q1800  . chlorhexidine  15 mL Mouth Rinse BID  . ciprofloxacin  400 mg Intravenous Q24H  . feeding supplement (PRO-STAT SUGAR FREE 64)  30 mL Per Tube Daily  . insulin aspart  0-15 Units Subcutaneous 6 times per day  . ipratropium-albuterol  3 mL Nebulization Q6H  . levETIRAcetam  500 mg Intravenous Q12H  . metoprolol tartrate  50 mg Per Tube BID  . pantoprazole sodium  40 mg Per Tube Q24H    Continuous Infusions: . sodium chloride 50 mL/hr at 05/27/14 0900  . feeding supplement (VITAL AF 1.2 CAL) 1,000 mL (05/27/14 0900)    [place], Leonardo, Maribel Pager [phone] After Hours Pager

## 2014-05-27 NOTE — Progress Notes (Signed)
Patient ID: [name], female   DOB: 08/29/1932, 81 y.o.   MRN: [id number] Extubated, not f/c. Moves right side well. Left with pain. Ct head seen . Drain in place. Spoke with daughter about not being aggressive, put her back in the ventilator or to go ahead with tracheostomy. She will talk with family about what to do next

## 2014-05-27 NOTE — Discharge Summary (Signed)
Physician Discharge Summary  Patient ID: [name] MRN: [id number] DOB/AGE: 1933/01/30 81 y.o.  Admit date: 05/23/2014 Discharge date: 05/27/2014  Admission Diagnoses:subdural hematoma.. copd  Discharge Diagnoses:  Active Problems:   Subdural hematoma   Acute respiratory failure, unspecified whether with hypoxia or hypercapnia   Chronic kidney disease   Endotracheal tube present   Respiratory failure   Urinary tract infectious disease   Discharged Condition: coma  Hospital Course: surgery  Consults palliative medicine  Significant Diagnostic Studies: ct head  Treatments: craniotomy  Discharge Exam: Blood pressure 160/64, pulse 88, temperature 99.4 F (37.4 C), temperature source Axillary, resp. rate 18, height  (1.6 Adkins), weight 52.2 kg (115 lb 1.3 oz), SpO2 100 %. Not f/c left hemiparesis. Subdural drain remoned  Disposition: home under the care of palliative medicine     Medication List    ASK your doctor about these medications        acetaminophen 325 MG tablet  Commonly known as:  TYLENOL  Take 650 mg by mouth every 6 (six) hours as needed for headache.     atorvastatin 20 MG tablet  Commonly known as:  LIPITOR  Take 20 mg by mouth at bedtime. daily     cephALEXin 500 MG capsule  Commonly known as:  KEFLEX  Take 1 capsule (500 mg total) by mouth 2 (two) times daily.     cloNIDine 0.1 MG tablet  Commonly known as:  CATAPRES  Take 0.1 mg by mouth 3 (three) times daily.     [employer] VITAMIN B12 1000 MCG tablet  Generic drug:  cyanocobalamin  TAKE 1 TABLET (1,000 MCG TOTAL) BY MOUTH DAILY.     docusate sodium 100 MG capsule  Commonly known as:  COLACE  Take 100 mg by mouth daily as needed for mild constipation.     furosemide 20 MG tablet  Commonly known as:  LASIX  Take 20 mg by mouth daily. Can take second dose as needed for edema     hydrALAZINE 50 MG tablet  Commonly known as:  APRESOLINE  Take 1.5 tablets (75 mg total) by mouth 3 (three)  times daily.     isosorbide mononitrate 60 MG 24 hr tablet  Commonly known as:  IMDUR  Take 1 tablet (60 mg total) by mouth daily.     KIONEX 15 GM/60ML suspension  Generic drug:  sodium polystyrene  Take 15 g by mouth every Monday, Wednesday, and Friday.     levETIRAcetam 250 MG tablet  Commonly known as:  KEPPRA  Take 1 tablet (250 mg total) by mouth 2 (two) times daily. Please take 2 tablets oral twice daily (500 mg oral twice daily), for the next 3 days ( till 02/19/14) , then resume 1 tablet oral (250 mg) twice daily from 12/27 thereafter.     megestrol 40 MG tablet  Commonly known as:  MEGACE  Take 40 mg by mouth 2 (two) times daily.     metoprolol succinate 50 MG 24 hr tablet  Commonly known as:  TOPROL-XL  Take 1 tablet (50 mg total) by mouth 2 (two) times daily. Take with or immediately following a meal.     mirtazapine 15 MG tablet  Commonly known as:  REMERON  Take 15 mg by mouth at bedtime.     oxybutynin 5 MG 24 hr tablet  Commonly known as:  DITROPAN-XL  Take 5 mg by mouth daily.     senna 8.6 MG Tabs tablet  Commonly known as:  SENOKOT  Take 2 tablets (17.2 mg total) by mouth 2 (two) times daily.     tiZANidine 4 MG tablet  Commonly known as:  ZANAFLEX  Take 1 tablet (4 mg total) by mouth every 6 (six) hours as needed for muscle spasms.     Vitamin D-3 1000 UNITS Caps  Take 1,000 Units by mouth daily.         Signed: Karn CassisBOTERO,Samin Milke Adkins 05/27/2014, 3:08 PM

## 2014-05-27 NOTE — Plan of Care (Signed)
Problem: Consults Goal: Diagnosis - Craniotomy Subdural hematoma     

## 2014-05-28 ENCOUNTER — Inpatient Hospital Stay (HOSPITAL_COMMUNITY): Payer: Medicare Other

## 2014-05-28 DIAGNOSIS — R0689 Other abnormalities of breathing: Secondary | ICD-10-CM

## 2014-05-28 DIAGNOSIS — R0600 Dyspnea, unspecified: Secondary | ICD-10-CM

## 2014-05-28 DIAGNOSIS — Z515 Encounter for palliative care: Secondary | ICD-10-CM

## 2014-05-28 MED ORDER — LORAZEPAM 2 MG/ML PO CONC: 0.5000 mg | Freq: Three times a day (TID) | ORAL | Status: DC

## 2014-05-28 NOTE — Progress Notes (Signed)
Pt transitioned to hospice care.  Will defer further management to Dr. Jeral FruitBotero.  PCCM will sign off.  Please call if additional help is needed.  Coralyn HellingVineet Armend Hochstatter, MD [facility] Pul[facility]/Critical Care 05/28/2014, 8:25 AM Pager:  [phone] After 3pm call: [phone]

## 2014-05-28 NOTE — Progress Notes (Signed)
Patient ID: Olivia GangHelen L Zakarian, female   DOB: 11/24/1932, 81 y.o.   MRN: [id number] Family to take her home. Palliative medicine to help with medications. DS done

## 2014-05-28 NOTE — Progress Notes (Signed)
Patient [name]      DOB: 17-May-1932      AVW:[id number]   Palliative Medicine Team at [facility] Progress Note    Subjective: Minimally responsive. Nonverbal. Appears comfortable   Filed Vitals:   05/28/14 0345  BP:   Pulse:   Temp: 99 F (37.2 C)  Resp:    Physical exam: GEN: minimally responsive, NAD HEENT: [place], sclera anicetric, mmm CV: regular rate Abd; soft EXT: warm   CBC    Component Value Date/Time   WBC 10.6* 05/27/2014 0435   WBC 6.7 05/19/2014 1308   RBC 2.44* 05/27/2014 0435   RBC 2.88* 05/19/2014 1308   RBC 2.81* 05/13/2014 1926   HGB 7.3* 05/27/2014 0435   HGB 8.1* 05/19/2014 1308   HCT 22.4* 05/27/2014 0435   HCT 25.5* 05/19/2014 1308   PLT 170 05/27/2014 0435   PLT 197 05/19/2014 1308   MCV 91.8 05/27/2014 0435   MCV 88.6 05/19/2014 1308   MCH 29.9 05/27/2014 0435   MCH 28.2 05/19/2014 1308   MCHC 32.6 05/27/2014 0435   MCHC 31.9 05/19/2014 1308   RDW 17.0* 05/27/2014 0435   RDW 14.7* 05/19/2014 1308   LYMPHSABS 1.2 05/24/2014 0510   LYMPHSABS 1.5 05/19/2014 1308   MONOABS 0.7 05/24/2014 0510   MONOABS 0.7 05/19/2014 1308   EOSABS 0.2 05/24/2014 0510   EOSABS 0.3 05/19/2014 1308   BASOSABS 0.0 05/24/2014 0510   BASOSABS 0.1 05/19/2014 1308    CMP     Component Value Date/Time   NA 144 05/27/2014 0435   NA 137 08/10/2013 1009   K 4.2 05/27/2014 0435   K 5.0 08/10/2013 1009   CL 117* 05/27/2014 0435   CL 106 07/10/2012 0909   CO2 21 05/27/2014 0435   CO2 20* 08/10/2013 1009   GLUCOSE 151* 05/27/2014 0435   GLUCOSE 99 08/10/2013 1009   GLUCOSE 102* 07/10/2012 0909   BUN 40* 05/27/2014 0435   BUN 60.3* 08/10/2013 1009   CREATININE 1.59* 05/27/2014 0435   CREATININE 2.5* 08/10/2013 1009   CALCIUM 7.6* 05/27/2014 0435   CALCIUM 8.8 08/10/2013 1009   PROT 6.9 05/23/2014 1339   PROT 5.9* 08/10/2013 1009   ALBUMIN 3.5 05/23/2014 1339   ALBUMIN 3.2* 08/10/2013 1009   AST 19 05/23/2014 1339   AST 11 08/10/2013 1009   ALT  11 05/23/2014 1339   ALT 6 08/10/2013 1009   ALKPHOS 74 05/23/2014 1339   ALKPHOS 54 08/10/2013 1009   BILITOT 1.3* 05/23/2014 1339   BILITOT 0.50 08/10/2013 1009   GFRNONAA 30* 05/27/2014 0435   GFRAA 35* 05/27/2014 0435     Assessment and plan: 81 yo female with recent traumatic SDH, re-admitted when she was found to be unresponsive and imaging revealed expanding ICH.  Transitioned to comfort care with plans to take home with hospice care  1. Code Status- DNR  2. Goals of Care- comfort care. Plan for home hospice care today. Suspect prognosis is in days. Family taking shifts to ensure 24h care. I made medication changes today to reflect comfort care and would recommend d/c'ing with that list  3. Symptom Management. - reinforced family to call hospice if any questions or concerns. They can adjust meds at home if needed  Pain/Dyspnea- oral morphine concentrate PRN (this can be absorbed sublingually and she does not need to swallow)  Terminal Secretions- atropine drops PRN  Sz ppx- ativan TID and PRN.  SL absorption  Orvis Brill D.O. Palliative Medicine Team at  [place]  Pager: [phone] Team Phone: [phone]

## 2014-06-09 ENCOUNTER — Other Ambulatory Visit: Payer: Self-pay | Admitting: Internal Medicine

## 2014-06-26 DEATH — deceased

## 2014-07-19 ENCOUNTER — Telehealth: Payer: Self-pay | Admitting: *Deleted

## 2014-07-19 ENCOUNTER — Other Ambulatory Visit: Payer: Medicare Other

## 2014-07-19 ENCOUNTER — Ambulatory Visit: Payer: Medicare Other

## 2014-07-19 NOTE — Telephone Encounter (Signed)
Called patient's home about missed appointment.  Daughter answered phone and told this nurse that her mother passed away on June 06, 2014.  i will notify the physician of this.

## 2014-07-25 ENCOUNTER — Other Ambulatory Visit: Payer: Self-pay | Admitting: Hematology and Oncology

## 2014-09-19 ENCOUNTER — Other Ambulatory Visit: Payer: Medicare Other

## 2014-09-19 ENCOUNTER — Ambulatory Visit: Payer: Medicare Other

## 2014-11-21 ENCOUNTER — Other Ambulatory Visit: Payer: Medicare Other

## 2014-11-21 ENCOUNTER — Ambulatory Visit: Payer: Medicare Other | Admitting: Hematology and Oncology

## 2014-11-21 ENCOUNTER — Ambulatory Visit: Payer: Medicare Other

## 2015-12-20 IMAGING — CT CT HEAD W/O CM
1 series · 16 of 30 positions shown, 20 images · non-contrast
Comparison: 03/04/2013

CLINICAL DATA: Code stroke, found unresponsive, left-sided weakness

EXAM:
CT HEAD WITHOUT CONTRAST
TECHNIQUE: Contiguous axial images were obtained from the base of the skull
through the vertex without intravenous contrast.

[Series 2: head 5.0 h30s · axial · 0.42mm/px · z∈[-212,-72]mm · 16 of 32 slices shown, 20 images]
[im 2/32  brain]
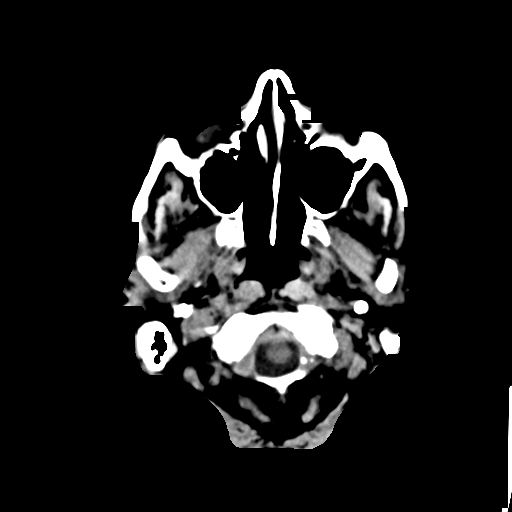
[im 2/32  bone]
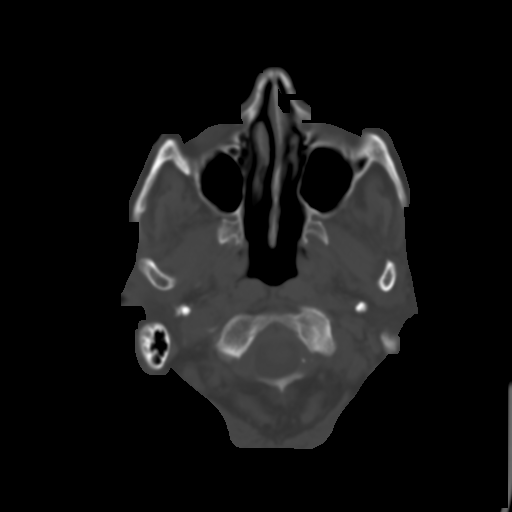
[im 4/32  brain]
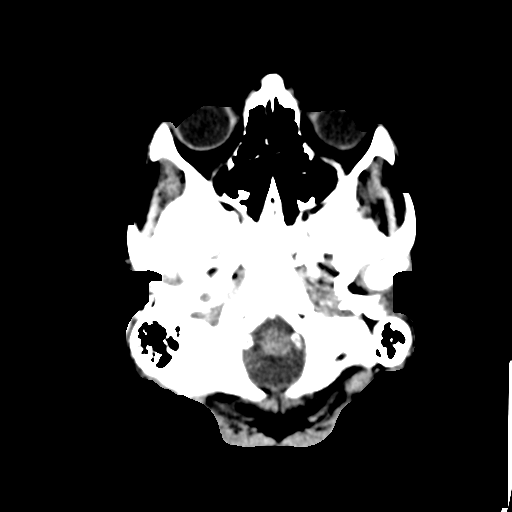
[im 6/32  brain]
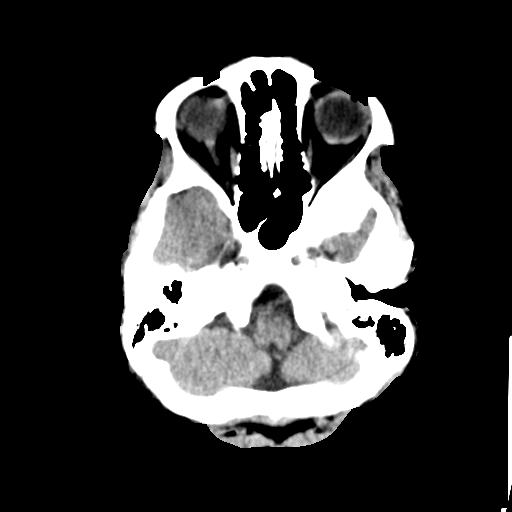
[im 8/32  brain]
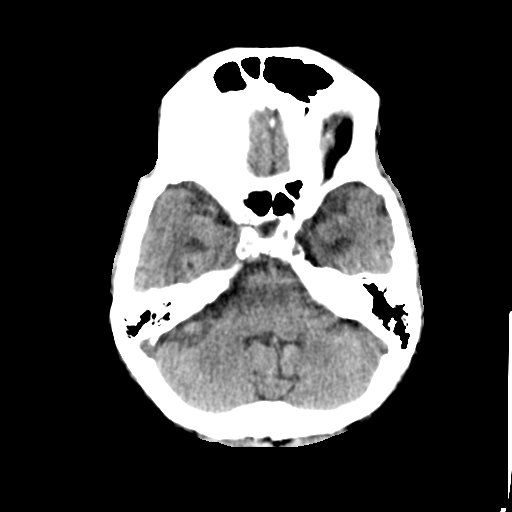
[im 9/32  brain]
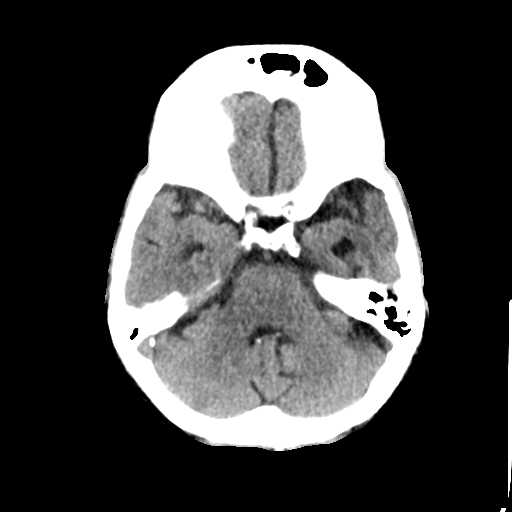
[im 9/32  bone]
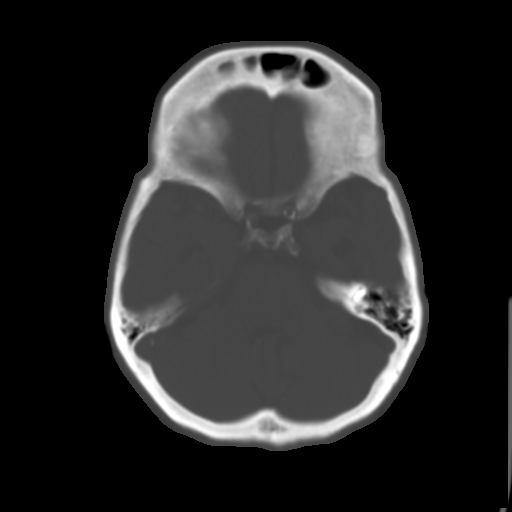
[im 11/32  brain]
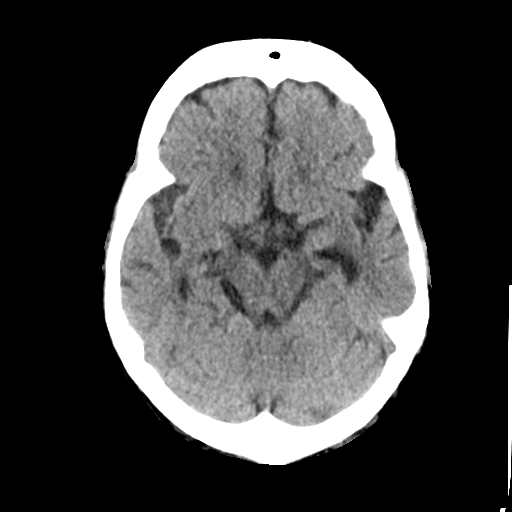
[im 13/32  brain]
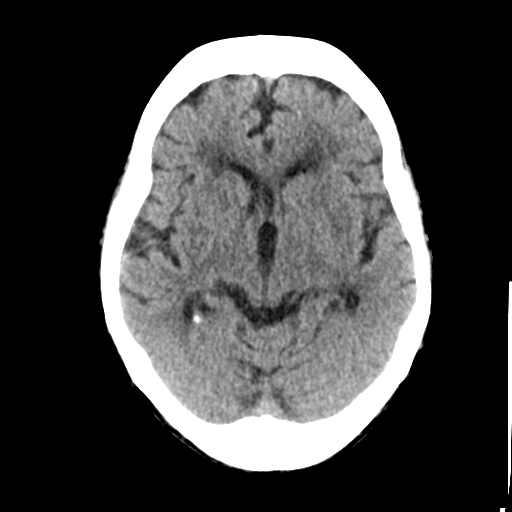
[im 15/32  brain]
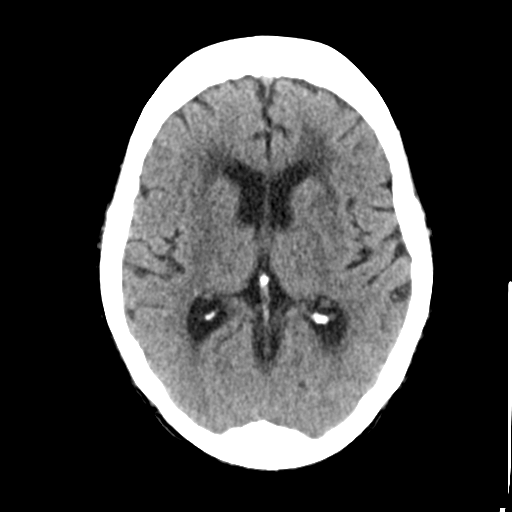
[im 17/32  brain]
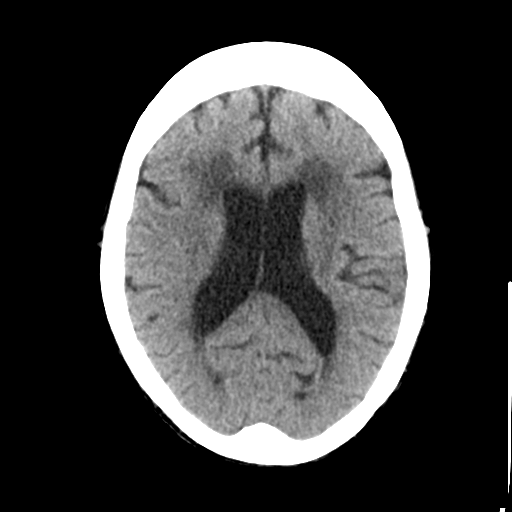
[im 17/32  bone]
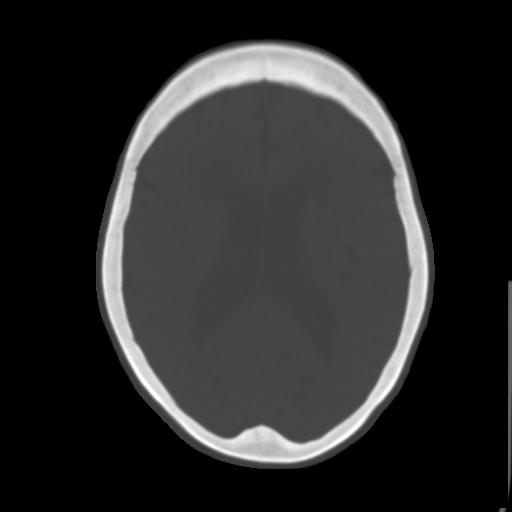
[im 19/32  brain]
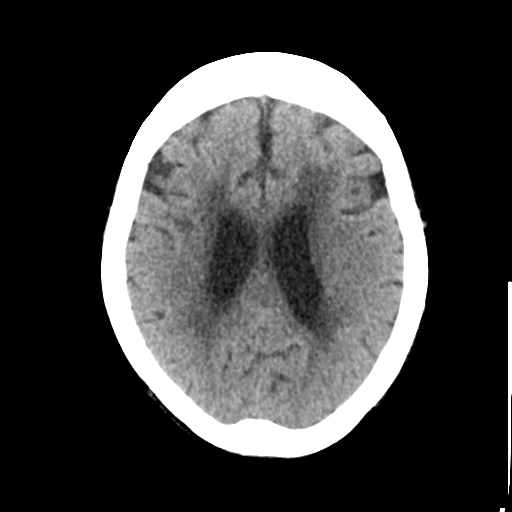
[im 21/32  brain]
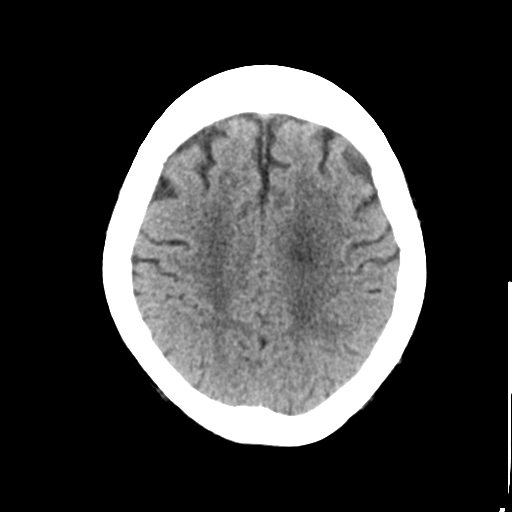
[im 23/32  brain]
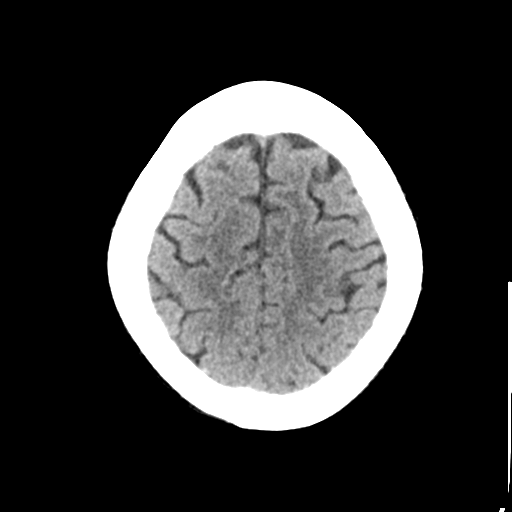
[im 24/32  brain]
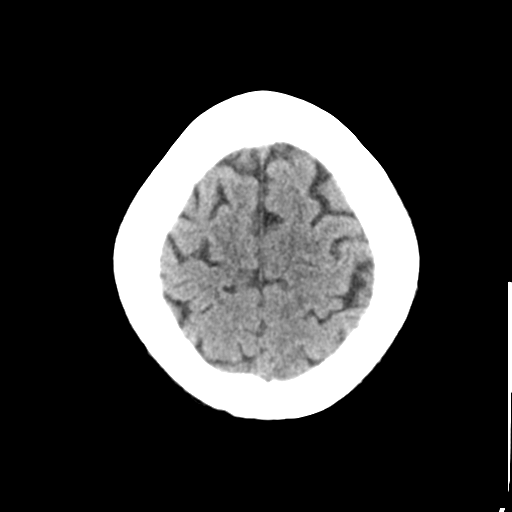
[im 24/32  bone]
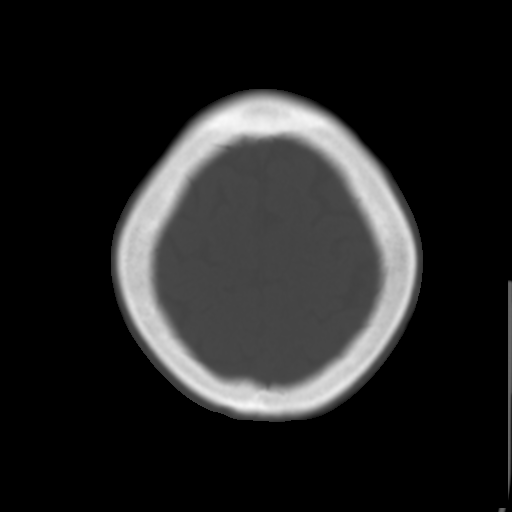
[im 26/32  brain]
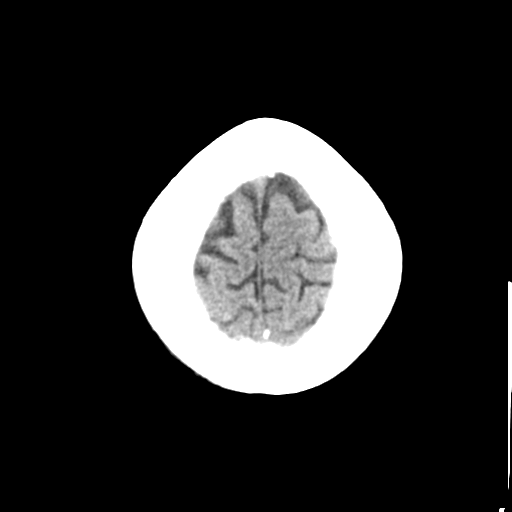
[im 28/32  brain]
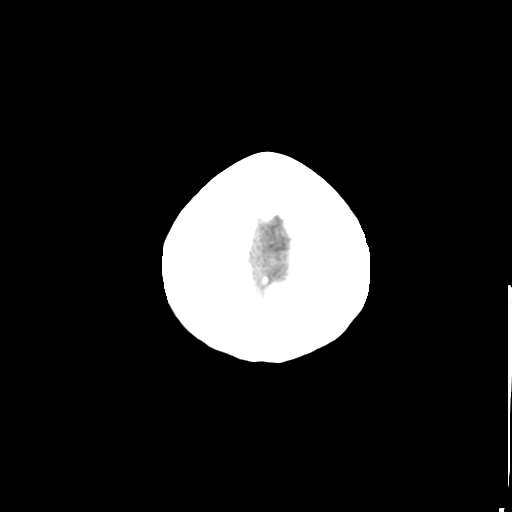
[im 30/32  brain]
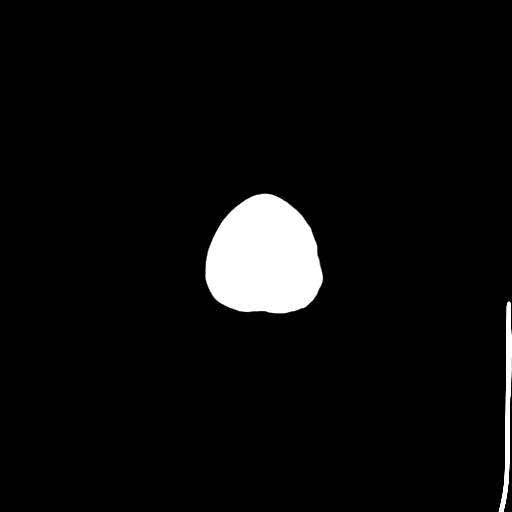

[16 of 30 positions shown; findings below may reference images not displayed]

FINDINGS: Mild cortical volume loss noted with proportional ventricular
prominence. Areas of periventricular white matter hypodensity are
most compatible with small vessel ischemic change. No acute
hemorrhage, infarct, or mass lesion is identified. No midline shift.
Orbits and paranasal sinuses are unremarkable. Prominence of the
skull diploic space is identified which may be associated with
long-term use of anti seizure medication.
IMPRESSION: No acute intracranial finding.

These results were called by telephone at the time of interpretation
on 02/16/2014 at [DATE] to Dr. Frdric Tzi, who verbally
acknowledged these results.
